# Patient Record
Sex: Male | Born: 1975 | Race: Black or African American | Hispanic: No | Marital: Married | Smoking: Never smoker
Health system: Southern US, Community
[De-identification: ages and names within clinical notes are randomized; demographics above are authoritative.]

## PROBLEM LIST (undated history)

## (undated) DIAGNOSIS — N289 Disorder of kidney and ureter, unspecified: Secondary | ICD-10-CM

## (undated) DIAGNOSIS — Z9119 Patient's noncompliance with other medical treatment and regimen: Secondary | ICD-10-CM

## (undated) DIAGNOSIS — R011 Cardiac murmur, unspecified: Secondary | ICD-10-CM

## (undated) DIAGNOSIS — B20 Human immunodeficiency virus [HIV] disease: Secondary | ICD-10-CM

## (undated) DIAGNOSIS — Z940 Kidney transplant status: Secondary | ICD-10-CM

## (undated) DIAGNOSIS — D649 Anemia, unspecified: Secondary | ICD-10-CM

## (undated) DIAGNOSIS — I10 Essential (primary) hypertension: Secondary | ICD-10-CM

## (undated) DIAGNOSIS — R569 Unspecified convulsions: Secondary | ICD-10-CM

## (undated) DIAGNOSIS — Z992 Dependence on renal dialysis: Secondary | ICD-10-CM

## (undated) DIAGNOSIS — B0229 Other postherpetic nervous system involvement: Secondary | ICD-10-CM

## (undated) DIAGNOSIS — N186 End stage renal disease: Secondary | ICD-10-CM

## (undated) DIAGNOSIS — D72820 Lymphocytosis (symptomatic): Secondary | ICD-10-CM

## (undated) HISTORY — DX: Other postherpetic nervous system involvement: B02.29

## (undated) HISTORY — DX: Unspecified convulsions: R56.9

## (undated) HISTORY — PX: KIDNEY TRANSPLANT: SHX239

## (undated) HISTORY — DX: Lymphocytosis (symptomatic): D72.820

## (undated) HISTORY — DX: Human immunodeficiency virus (HIV) disease: B20

## (undated) HISTORY — PX: INSERTION OF DIALYSIS CATHETER: SHX1324

## (undated) HISTORY — DX: Anemia, unspecified: D64.9

## (undated) HISTORY — DX: Patient's noncompliance with other medical treatment and regimen: Z91.19

---

## 2001-06-01 ENCOUNTER — Emergency Department (HOSPITAL_COMMUNITY): Admission: EM | Admit: 2001-06-01 | Discharge: 2001-06-01 | Payer: Self-pay | Admitting: Emergency Medicine

## 2004-11-15 ENCOUNTER — Emergency Department (HOSPITAL_COMMUNITY): Admission: EM | Admit: 2004-11-15 | Discharge: 2004-11-15 | Payer: Self-pay | Admitting: Emergency Medicine

## 2007-02-08 ENCOUNTER — Emergency Department (HOSPITAL_COMMUNITY): Admission: EM | Admit: 2007-02-08 | Discharge: 2007-02-09 | Payer: Self-pay | Admitting: *Deleted

## 2007-02-09 ENCOUNTER — Ambulatory Visit (HOSPITAL_COMMUNITY): Admission: RE | Admit: 2007-02-09 | Discharge: 2007-02-09 | Payer: Self-pay | Admitting: *Deleted

## 2013-05-03 ENCOUNTER — Encounter (HOSPITAL_COMMUNITY): Payer: Self-pay

## 2013-05-03 ENCOUNTER — Observation Stay (HOSPITAL_COMMUNITY)
Admission: EM | Admit: 2013-05-03 | Discharge: 2013-05-08 | Disposition: A | Discharge status: Home / Self Care | Payer: Managed Care, Other (non HMO) | Attending: Internal Medicine | Admitting: Internal Medicine

## 2013-05-03 DIAGNOSIS — D649 Anemia, unspecified: Secondary | ICD-10-CM

## 2013-05-03 DIAGNOSIS — M7989 Other specified soft tissue disorders: Secondary | ICD-10-CM | POA: Insufficient documentation

## 2013-05-03 DIAGNOSIS — Z992 Dependence on renal dialysis: Secondary | ICD-10-CM | POA: Insufficient documentation

## 2013-05-03 DIAGNOSIS — I10 Essential (primary) hypertension: Secondary | ICD-10-CM

## 2013-05-03 DIAGNOSIS — R7881 Bacteremia: Secondary | ICD-10-CM

## 2013-05-03 DIAGNOSIS — M899 Disorder of bone, unspecified: Secondary | ICD-10-CM | POA: Insufficient documentation

## 2013-05-03 DIAGNOSIS — I120 Hypertensive chronic kidney disease with stage 5 chronic kidney disease or end stage renal disease: Principal | ICD-10-CM | POA: Insufficient documentation

## 2013-05-03 DIAGNOSIS — N186 End stage renal disease: Secondary | ICD-10-CM | POA: Insufficient documentation

## 2013-05-03 DIAGNOSIS — R0602 Shortness of breath: Secondary | ICD-10-CM | POA: Insufficient documentation

## 2013-05-03 DIAGNOSIS — Z79899 Other long term (current) drug therapy: Secondary | ICD-10-CM | POA: Insufficient documentation

## 2013-05-03 DIAGNOSIS — B20 Human immunodeficiency virus [HIV] disease: Secondary | ICD-10-CM

## 2013-05-03 DIAGNOSIS — E875 Hyperkalemia: Secondary | ICD-10-CM | POA: Insufficient documentation

## 2013-05-03 DIAGNOSIS — E8779 Other fluid overload: Secondary | ICD-10-CM | POA: Insufficient documentation

## 2013-05-03 DIAGNOSIS — N19 Unspecified kidney failure: Secondary | ICD-10-CM

## 2013-05-03 HISTORY — DX: Essential (primary) hypertension: I10

## 2013-05-03 HISTORY — DX: Disorder of kidney and ureter, unspecified: N28.9

## 2013-05-03 HISTORY — DX: Dependence on renal dialysis: Z99.2

## 2013-05-03 LAB — CBC WITH DIFFERENTIAL/PLATELET
Lymphocytes Relative: 28 % (ref 12–46)
Lymphs Abs: 1.3 10*3/uL (ref 0.7–4.0)
Neutrophils Relative %: 53 % (ref 43–77)
Platelets: 246 10*3/uL (ref 150–400)
RBC: 2.98 MIL/uL — ABNORMAL LOW (ref 4.22–5.81)
WBC: 4.6 10*3/uL (ref 4.0–10.5)

## 2013-05-03 LAB — COMPREHENSIVE METABOLIC PANEL
ALT: 24 U/L (ref 0–53)
Alkaline Phosphatase: 87 U/L (ref 39–117)
CO2: 26 mEq/L (ref 19–32)
GFR calc Af Amer: 6 mL/min — ABNORMAL LOW (ref >90)
GFR calc non Af Amer: 5 mL/min — ABNORMAL LOW (ref >90)
Glucose, Bld: 74 mg/dL (ref 70–99)
Potassium: 5.3 mEq/L — ABNORMAL HIGH (ref 3.5–5.1)
Sodium: 141 mEq/L (ref 135–145)

## 2013-05-03 NOTE — ED Notes (Signed)
Pt new HD pt since Feb 2014 and moved here from [place].  NOrmal HD MWF was not HD today because he was told he needed a graft site.  He currently has a working HD cath in his left chest.  Presents for admission for placement of graft. Dr Detterding will be accepting pt.

## 2013-05-03 NOTE — ED Notes (Signed)
Pt reports he was recently dx w/kidney failure and has been receiving dialysis through a port in his Left chest. Pt is unable to complete dialysis d/t not having a fistula, he was instructed to come here to have a fistula placed and receive dialysis treatment until he could start care w/a local Nephrologist. Pt reports he just moved here from [place]. He receives dialysis M/W/F and his lat treatment was Friday

## 2013-05-04 ENCOUNTER — Emergency Department (HOSPITAL_COMMUNITY): Payer: Managed Care, Other (non HMO)

## 2013-05-04 ENCOUNTER — Encounter (HOSPITAL_COMMUNITY): Payer: Self-pay | Admitting: Internal Medicine

## 2013-05-04 ENCOUNTER — Other Ambulatory Visit: Payer: Self-pay | Admitting: *Deleted

## 2013-05-04 ENCOUNTER — Telehealth: Payer: Self-pay | Admitting: Vascular Surgery

## 2013-05-04 DIAGNOSIS — I10 Essential (primary) hypertension: Secondary | ICD-10-CM | POA: Diagnosis present

## 2013-05-04 DIAGNOSIS — B20 Human immunodeficiency virus [HIV] disease: Secondary | ICD-10-CM | POA: Diagnosis present

## 2013-05-04 DIAGNOSIS — D649 Anemia, unspecified: Secondary | ICD-10-CM | POA: Diagnosis present

## 2013-05-04 DIAGNOSIS — E875 Hyperkalemia: Secondary | ICD-10-CM | POA: Diagnosis present

## 2013-05-04 DIAGNOSIS — N186 End stage renal disease: Secondary | ICD-10-CM

## 2013-05-04 DIAGNOSIS — Z992 Dependence on renal dialysis: Secondary | ICD-10-CM | POA: Diagnosis present

## 2013-05-04 LAB — BASIC METABOLIC PANEL
Chloride: 105 mEq/L (ref 96–112)
GFR calc Af Amer: 5 mL/min — ABNORMAL LOW (ref >90)
GFR calc Af Amer: 5 mL/min — ABNORMAL LOW (ref >90)
GFR calc non Af Amer: 4 mL/min — ABNORMAL LOW (ref >90)
Glucose, Bld: 69 mg/dL — ABNORMAL LOW (ref 70–99)
Potassium: 6 mEq/L — ABNORMAL HIGH (ref 3.5–5.1)
Potassium: 5.1 mEq/L (ref 3.5–5.1)
Sodium: 139 mEq/L (ref 135–145)
Sodium: 138 mEq/L (ref 135–145)

## 2013-05-04 LAB — FOLATE: Folate: 16.9 ng/mL

## 2013-05-04 LAB — VITAMIN B12: Vitamin B-12: 579 pg/mL (ref 211–911)

## 2013-05-04 LAB — RETICULOCYTES
RBC.: 3.05 MIL/uL — ABNORMAL LOW (ref 4.22–5.81)
Retic Count, Absolute: 61 10*3/uL (ref 19.0–186.0)

## 2013-05-04 LAB — MRSA PCR SCREENING: MRSA by PCR: NEGATIVE

## 2013-05-04 MED ORDER — CARVEDILOL 6.25 MG PO TABS
6.2500 mg | ORAL_TABLET | Freq: Two times a day (BID) | ORAL | Status: DC
  Administered 2013-05-04 – 2013-05-08 (×8): 6.25 mg via ORAL
  Filled 2013-05-04 (×10): qty 1

## 2013-05-04 MED ORDER — ATOVAQUONE 750 MG/5ML PO SUSP
1500.0000 mg | Freq: Every day | ORAL | Status: DC
  Administered 2013-05-04 – 2013-05-06 (×3): 1500 mg via ORAL
  Filled 2013-05-04 (×3): qty 10

## 2013-05-04 MED ORDER — ACETAMINOPHEN 325 MG PO TABS
650.0000 mg | ORAL_TABLET | Freq: Four times a day (QID) | ORAL | Status: DC | PRN
  Administered 2013-05-07 – 2013-05-08 (×2): 650 mg via ORAL
  Filled 2013-05-04: qty 2

## 2013-05-04 MED ORDER — ASPIRIN EC 81 MG PO TBEC
81.0000 mg | DELAYED_RELEASE_TABLET | Freq: Every day | ORAL | Status: DC
  Administered 2013-05-04 – 2013-05-08 (×5): 81 mg via ORAL
  Filled 2013-05-04 (×5): qty 1

## 2013-05-04 MED ORDER — ONDANSETRON HCL 4 MG PO TABS
4.0000 mg | ORAL_TABLET | Freq: Four times a day (QID) | ORAL | Status: DC | PRN
  Administered 2013-05-07: 4 mg via ORAL
  Filled 2013-05-04: qty 1

## 2013-05-04 MED ORDER — ACETAMINOPHEN 650 MG RE SUPP: 650.0000 mg | Freq: Four times a day (QID) | RECTAL | Status: DC | PRN

## 2013-05-04 MED ORDER — DEXTROSE 5 % IV SOLN
160.0000 mg | Freq: Once | INTRAVENOUS | Status: AC
  Administered 2013-05-04: 160 mg via INTRAVENOUS
  Filled 2013-05-04: qty 16

## 2013-05-04 MED ORDER — LOSARTAN POTASSIUM 50 MG PO TABS
50.0000 mg | ORAL_TABLET | Freq: Every day | ORAL | Status: DC
  Administered 2013-05-04 – 2013-05-08 (×5): 50 mg via ORAL
  Filled 2013-05-04 (×5): qty 1

## 2013-05-04 MED ORDER — HEPARIN SODIUM (PORCINE) 5000 UNIT/ML IJ SOLN
5000.0000 [IU] | Freq: Three times a day (TID) | INTRAMUSCULAR | Status: DC
  Administered 2013-05-04 – 2013-05-08 (×8): 5000 [IU] via SUBCUTANEOUS
  Filled 2013-05-04 (×14): qty 1

## 2013-05-04 MED ORDER — ONDANSETRON HCL 4 MG/2ML IJ SOLN: 4.0000 mg | Freq: Four times a day (QID) | INTRAMUSCULAR | Status: DC | PRN

## 2013-05-04 MED ORDER — SODIUM CHLORIDE 0.9 % IJ SOLN
3.0000 mL | Freq: Two times a day (BID) | INTRAMUSCULAR | Status: DC
  Administered 2013-05-04 – 2013-05-08 (×6): 3 mL via INTRAVENOUS

## 2013-05-04 MED ORDER — SODIUM POLYSTYRENE SULFONATE 15 GM/60ML PO SUSP
30.0000 g | Freq: Once | ORAL | Status: AC
  Administered 2013-05-04: 30 g via ORAL
  Filled 2013-05-04 (×2): qty 60

## 2013-05-04 MED ORDER — SULFAMETHOXAZOLE-TMP DS 800-160 MG PO TABS
1.0000 | ORAL_TABLET | Freq: Two times a day (BID) | ORAL | Status: DC
  Administered 2013-05-04 – 2013-05-08 (×8): 1 via ORAL
  Filled 2013-05-04 (×9): qty 1

## 2013-05-04 MED ORDER — AZITHROMYCIN 600 MG PO TABS
1200.0000 mg | ORAL_TABLET | ORAL | Status: DC
  Administered 2013-05-07: 1200 mg via ORAL
  Filled 2013-05-04: qty 2

## 2013-05-04 NOTE — ED Notes (Signed)
Admit Doctor stated will admit patient to hospital patient verbalized understanding.

## 2013-05-04 NOTE — Progress Notes (Addendum)
Pt arrived to unit via ED stretcher a&ox4 accompanied by NT. Pt ambulatory from stretcher to bed, steady gait. Pt states had episode of falls in March after initially beginning HD, denies dizziness. Fall Plan signed, pt agrees to use call bell. Pt oriented to unit. Pt denies pain, endorses SOB, lungs CTA, SpO2 98% RA, pt speaking in full sentences. Placed on telemetry and CMT notified of arrival. No skin issues, pt has tunneled HD cath to left chest and tattoos, no other skin issues. Will continue to monitor.

## 2013-05-04 NOTE — ED Provider Notes (Signed)
History    CSN: [id number] Arrival date & time 05/03/13  2209  First MD Initiated Contact with Patient 05/03/13 2359     Chief Complaint  Patient presents with  . Vascular Access Problem   (Consider location/radiation/quality/duration/timing/severity/associated sxs/prior Treatment) HPI Pt is a 37yo male who is newly dx with kidney failure and started on dialysis in Jan 2014. Pt states he was found to have acutely elevated BP which caused kidney failure, reports strong family hx of kidney failure.  Pt recently moved from [place] to [place].  He does have a catheter placed in his left upper chest but was told that he needs a fistula placed in order for dialysis clinic to see the pt.  He was told by his social worker that Dr. Jimmy Footman would be his new nephrologist but he does not currently have a PCP since he just moved here on Friday.  Pt states he is suppose to go to dialysis M/W/F so he missed today's tx due to not having a fistula. Reports mild SOB and lower leg swelling associated with mild nausea and diarrhea (no blood or mucus). Denies any chest, abdominal or flank pain. Denies any urinary symptoms.   Past Medical History  Diagnosis Date  . Hypertension   . Dialysis patient   . Renal disorder    History reviewed. No pertinent past surgical history. Family History  Problem Relation Age of Onset  . Kidney failure Mother   . Kidney failure Maternal Uncle    History  Substance Use Topics  . Smoking status: Never Smoker   . Smokeless tobacco: Not on file  . Alcohol Use: 0.6 oz/week    1 Glasses of wine per week     Comment: 1 glass wine per week    Review of Systems  Constitutional: Negative for fever, chills, diaphoresis, appetite change and fatigue.  Respiratory: Positive for shortness of breath. Negative for cough, choking, chest tightness, wheezing and stridor.   Cardiovascular: Positive for leg swelling. Negative for chest pain and palpitations.  Gastrointestinal: Positive for  nausea and diarrhea. Negative for vomiting, abdominal pain and constipation.  Genitourinary: Negative for dysuria, hematuria and flank pain.  Skin: Negative for color change.  All other systems reviewed and are negative.    Allergies  Codeine; Eggs or egg-derived products; Mercury; and Shellfish allergy  Home Medications   No current outpatient prescriptions on file. BP 140/90  Pulse 108  Temp(Src) 97.9 F (36.6 C) (Oral)  Resp 18  Ht 5' 7.5" (1.715 m)  Wt 162 lb 6.4 oz (73.664 kg)  BMI 25.05 kg/m2  SpO2 100% Physical Exam  Nursing note and vitals reviewed. Constitutional: He appears well-developed and well-nourished. No distress.  Pt lying comfortably on exam bed. NAD.  HENT:  Head: Normocephalic and atraumatic.  Eyes: Conjunctivae are normal. Right eye exhibits no discharge. Left eye exhibits no discharge. No scleral icterus.  Neck: Normal range of motion. Neck supple.  Cardiovascular: Normal rate, regular rhythm and normal heart sounds.   Pulmonary/Chest: Effort normal and breath sounds normal. No respiratory distress. He has no wheezes. He has no rales. He exhibits tenderness ( mild around newly placed catheter).  Newly placed, well healing venous access in left upper chest.   Abdominal: Soft. Bowel sounds are normal. He exhibits no distension and no mass. There is no tenderness. There is no rebound and no guarding.  Musculoskeletal: Normal range of motion.  Neurological: He is alert.  Skin: Skin is warm and dry. He is  not diaphoretic.    ED Course  Procedures (including critical care time) Labs Reviewed  CBC WITH DIFFERENTIAL - Abnormal; Notable for the following:    RBC 2.98 (*)    Hemoglobin 9.4 (*)    HCT 28.6 (*)    RDW 16.0 (*)    Eosinophils Relative 10 (*)    All other components within normal limits  COMPREHENSIVE METABOLIC PANEL - Abnormal; Notable for the following:    Potassium 5.3 (*)    BUN 35 (*)    Creatinine, Ser 10.88 (*)    Calcium 7.7 (*)     Albumin 1.8 (*)    Total Bilirubin 0.1 (*)    GFR calc non Af Amer 5 (*)    GFR calc Af Amer 6 (*)    All other components within normal limits  PHOSPHORUS - Abnormal; Notable for the following:    Phosphorus 4.9 (*)    All other components within normal limits  BASIC METABOLIC PANEL - Abnormal; Notable for the following:    Potassium 6.0 (*)    Glucose, Bld 69 (*)    BUN 40 (*)    Creatinine, Ser 12.42 (*)    Calcium 7.7 (*)    GFR calc non Af Amer 4 (*)    GFR calc Af Amer 5 (*)    All other components within normal limits  IRON AND TIBC - Abnormal; Notable for the following:    TIBC 175 (*)    All other components within normal limits  FERRITIN - Abnormal; Notable for the following:    Ferritin 485 (*)    All other components within normal limits  RETICULOCYTES - Abnormal; Notable for the following:    RBC. 3.05 (*)    All other components within normal limits  BASIC METABOLIC PANEL - Abnormal; Notable for the following:    Glucose, Bld 164 (*)    BUN 40 (*)    Creatinine, Ser 12.50 (*)    Calcium 7.5 (*)    GFR calc non Af Amer 4 (*)    GFR calc Af Amer 5 (*)    All other components within normal limits  MRSA PCR SCREENING  HEPATITIS B SURFACE ANTIGEN  MAGNESIUM  VITAMIN B12  FOLATE  HIV-1 RNA ULTRAQUANT REFLEX TO GENTYP+  T-HELPER CELLS (CD4) COUNT  PARATHYROID HORMONE, INTACT (NO CA)  CBC  RENAL FUNCTION PANEL   Dg Chest 2 View  05/04/2013   *RADIOLOGY REPORT*  Clinical Data: Shortness of breath, dialysis patient.  CHEST - 2 VIEW  Comparison: None.  Findings: Left dialysis catheter is in place with the tip at the cavoatrial junction.  Mild cardiomegaly.  Mild peribronchial thickening.  No confluent airspace opacities, effusions or edema. No acute bony abnormality.  IMPRESSION: Cardiomegaly.  Mild peribronchial thickening.   Original Report Authenticated By: Rolm Baptise, M.D.   1. Hyperkalemia   2. Kidney failure   3. AIDS     MDM  Pt states he needs  fistula placed and dialysis done in ER because he is new to the area and was told the catheter he has in left upper chest is not accepted at dialysis centers in [place].  Dr. Jimmy Footman is suppose to be pt's new nephrologist (according to pt).  Labs ordered: CBC and CMP. CXR ordered too.   Discussed pt with Dr. Cheri Guppy who consulted nephrology.  Pt was advised by nephrology prior to moving to [place] that he did need a permanent catheter placed for dialysis. Dr. Jimmy Footman  mentioned pt is HIV positive, pt did not mention during initial H&P.  When questioned further, pt states he was newly diagnosed, CD4 count was "low" pt believes 60, and does not know viral load.    Dr. Cheri Guppy is going to consult vascular, pt will likely be observed in ER until he can go to dialysis in the morning.    Singed out to Dr. Cheri Guppy at shift change.   Noland Fordyce, PA-C 05/05/13 0102

## 2013-05-04 NOTE — ED Notes (Signed)
Dr in to see pt 

## 2013-05-04 NOTE — ED Notes (Signed)
Attempted to call report x 1  

## 2013-05-04 NOTE — Telephone Encounter (Signed)
lvm re appt info (this Friday) and asked pt to cb to confirm appt - kf

## 2013-05-04 NOTE — Telephone Encounter (Signed)
Message copied by Berniece Salines on Tue May 04, 2013 10:46 AM ------      Message from: Alfonso Patten      Created: Tue May 04, 2013  9:32 AM       Dr Otilio Miu has left a phone msg which I forwarded also on this pt. He would like this pt to be seen in the next 1-2 weeks with whoever has an opening for access evaluation. The pt has a catheter. He also wants bilateral vein mapping. I will order that.      Thanks      JJK ------

## 2013-05-04 NOTE — ED Notes (Signed)
Contacted [facility] about wait time for bed. Apologized to pt for delay.

## 2013-05-04 NOTE — ED Notes (Signed)
Apple sauce given to pt.  Offers no compliants

## 2013-05-04 NOTE — H&P (Signed)
Date: 05/04/2013               Patient Name:  Dylan Barber MRN: [id number]  DOB: 1976-08-23 Age / Sex: 37 y.o., male   PCP: Placido Sou, MD         Medical Service: Internal Medicine Teaching Service         Attending Physician: Dr. Elyn Peers, MD    First Contact: Dr. Randell Loop Pager: [phone]  Second Contact: Dr. Jannette Fogo Pager: [phone]       After Hours (After 5p/  First Contact Pager: [phone]  weekends / holidays): Second Contact Pager: [phone]   Chief Complaint: ESRD on dialysis  History of Present Illness: [name] is a 37 year old man with a PMH of end stage renal disease initially diagnosed in 11/2012 at [facility], dialysis started in February at the same, and newly discovered HIV infection that was initially diagnosed in April of 2014 who presents to the [facility] ED for dialysis.  He states that he was last dialyzed in [place], [place]. On Friday and was told by his social worker there to present to the ED at [facility] to get placed in a dialysis center here in [place].  He states that he moved here Saturday to be closer to his father who lives in [place].  He has a strong family history of ESRD with kidney failure in his mother and uncle.  He has a tunneled catheter placed in his left upper chest that was placed 2 weeks ago per his report.  He states that he is trying to get set up with Dr. Jimmy Footman and was told this was what he had to do to get this set up.  Currently he states that he is concerned about his lower extremity swelling and shortness of breath.  He states that he gets fatigued walking from the ED room to the bathroom less then 100 ft away.  He also has a hard time laying down to sleep.  He denies cough, or palpitations.    He also notes that he was diagnosed with HIV in April of 2014.  He does not remember his CD 4 count or his viral load but states that "I know it was low."  He has not started ART therapy but states that he has been taking his  Atovaquone, Azithromycin, and Bactrim as prescribed by his doctor in [place], [place]. He currently denies any cough, fevers, chills, nausea, vomiting, chest pain, or abdominal pain.    Meds: Current Outpatient Prescriptions  Medication Sig Dispense Refill  . atovaquone (MEPRON) 750 MG/5ML suspension Take 1,500 mg by mouth daily.      Marland Kitchen azithromycin (ZITHROMAX) 600 MG tablet Take 1,200 mg by mouth every 7 (seven) days. Fridays      . carvedilol (COREG) 6.25 MG tablet Take 6.25 mg by mouth 2 (two) times daily with a meal.      . losartan (COZAAR) 50 MG tablet Take 50 mg by mouth daily.      Marland Kitchen sulfamethoxazole-trimethoprim (BACTRIM DS) 800-160 MG per tablet Take 1 tablet by mouth 2 (two) times daily. For 14 days; Start date 04/29/13       Allergies: Allergies as of 05/03/2013 - Review Complete 05/03/2013  Allergen Reaction Noted  . Codeine  05/03/2013  . Eggs or egg-derived products  05/03/2013  . Mercury  05/03/2013  . Shellfish allergy  05/03/2013   Past Medical History  Diagnosis Date  . Hypertension   .  Dialysis patient   . Renal disorder    History reviewed. No pertinent past surgical history. Family History  Problem Relation Age of Onset  . Kidney failure Mother   . Kidney failure Maternal Uncle    History   Social History  . Marital Status: Single    Spouse Name: N/A    Number of Children: N/A  . Years of Education: N/A   Occupational History  . Not on file.   Social History Main Topics  . Smoking status: Never Smoker   . Smokeless tobacco: Not on file  . Alcohol Use: 0.6 oz/week    1 Glasses of wine per week     Comment: 1 glass wine per week  . Drug Use: No  . Sexually Active: Not on file   Other Topics Concern  . Not on file   Social History Narrative   Originally from [place],  States father worked at [employer].  Moved from [place], [place]. On 6/21 to live with father.    Review of Systems: Constitutional: Positive for fatigue.  Denies fever, chills,  diaphoresis, appetite change.  HEENT: Denies photophobia, eye pain, redness, hearing loss, ear pain, congestion, sore throat, rhinorrhea, sneezing, mouth sores, trouble swallowing, neck pain, neck stiffness and tinnitus.   Respiratory: Positive for SOB and orthopnea.  Denies DOE, cough, chest tightness, and wheezing.   Cardiovascular: Denies chest pain, palpitations and leg swelling.  Gastrointestinal: Denies nausea, vomiting, abdominal pain, diarrhea, constipation, blood in stool and abdominal distention.  Genitourinary: Denies dysuria, urgency, frequency, hematuria, flank pain and difficulty urinating.  Endocrine: Denies: hot or cold intolerance, sweats, changes in hair or nails, polyuria, polydipsia. Musculoskeletal: Denies myalgias, back pain, joint swelling, arthralgias and gait problem.  Skin: Denies pallor, rash and wound.  Neurological: Denies dizziness, seizures, syncope, weakness, light-headedness, numbness and headaches.  Hematological: Denies adenopathy. Easy bruising, personal or family bleeding history  Psychiatric/Behavioral: Denies suicidal ideation, mood changes, confusion, nervousness, sleep disturbance and agitation  Physical Exam: Blood pressure 144/110, pulse 84, temperature 98.6 F (37 C), temperature source Oral, resp. rate 14, height 5\' 7"  (1.702 m), weight 155 lb (70.308 kg), SpO2 100.00%. Constitutional: Vital signs reviewed.  Patient is a well-developed and well-nourished man in no acute distress and cooperative with exam. Alert and oriented x3.  Head: Normocephalic and atraumatic Ear: TM normal bilaterally Nose: No erythema or drainage noted.  Turbinates normal Mouth: no erythema or exudates, MMM Eyes: PERRL, EOMI, conjunctivae normal, No scleral icterus.  Neck: Supple, Trachea midline normal ROM, No JVD, mass, thyromegaly, or carotid bruit present.  Cardiovascular: RRR, S1 normal, S2 normal, no MRG, pulses symmetric and intact bilaterally Pulmonary/Chest: normal  respiratory effort, mild bibasilar crackles noted on full inspiration.  no wheezes, or rhonchi Abdominal: Soft. Non-tender, non-distended, bowel sounds are normal, no masses, organomegaly, or guarding present.  GU: no CVA tenderness Musculoskeletal: No joint deformities, erythema, or stiffness, ROM full and no nontender Hematology: no cervical, inginal, or axillary adenopathy.  Neurological: A&O x3, Strength is normal and symmetric bilaterally, cranial nerve II-XII are grossly intact, no focal motor deficit, sensory intact to light touch bilaterally.  Skin: 1+ pitting edema to the knees bilaterally.  Warm, dry and intact. No rash, cyanosis, or clubbing.  Psychiatric: Normal mood and affect. speech and behavior is normal. Judgment and thought content normal. Cognition and memory are normal.   Lab results: Basic Metabolic Panel:  Recent Labs  05/03/13 2230  NA 141  K 5.3*  CL 107  CO2 26  GLUCOSE 74  BUN 35*  CREATININE 10.88*  CALCIUM 7.7*   Liver Function Tests:  Recent Labs  05/03/13 2230  AST 33  ALT 24  ALKPHOS 87  BILITOT 0.1*  PROT 6.2  ALBUMIN 1.8*   CBC:  Recent Labs  05/03/13 2230  WBC 4.6  NEUTROABS 2.4  HGB 9.4*  HCT 28.6*  MCV 96.0  PLT 246   Imaging results:  Dg Chest 2 View  05/04/2013   *RADIOLOGY REPORT*  Clinical Data: Shortness of breath, dialysis patient.  CHEST - 2 VIEW  Comparison: None.  Findings: Left dialysis catheter is in place with the tip at the cavoatrial junction.  Mild cardiomegaly.  Mild peribronchial thickening.  No confluent airspace opacities, effusions or edema. No acute bony abnormality.  IMPRESSION: Cardiomegaly.  Mild peribronchial thickening.   Original Report Authenticated By: Rolm Baptise, M.D.   Other results: EKG: pending  Assessment & Plan by Problem: Mr. Stapleford is a 37 year old man who presents with ESRD on dialysis and HIV.  1.  ESRD on dialysis:  Mr. Mccullen was last dialyzed on Friday 6/20.  I spoke with both Dr.  Florene Glen from [facility] as well as Dr. Jimmy Footman who the patient states he is trying to be his primary nephrologist and they stated that the patient was told that he can not be set up with outpatient dialysis in [place] until he has a permanent access in place, aka. AV fistula.  He states that he underwent vein mapping in . Last week but they would not place the fistula because he was moving.  He has mild hyperkalemia and mild SOB with a normal chest x-ray and normal oxygen saturations.  He does not have acidosis, marked fluid overload, or uremia so he has no urgent indications for dialysis today but likely will in the next day or so.  Dr. Oneida Alar from Vascular surgery saw him today and will work on getting him set up to have his fistula placed.  The patient has no general PCP in the area and will likely need dialysis in the next 1-2 days.    - Admit for observation  - Discuss with Dr. Oneida Alar if his fistula can be done as an inpatient or if he can discuss the patients dialysis access with [facility].    - Repeat chest x-ray and renal panel in the AM to assess need for dialysis tomorrow  -  Continue to discuss his outpatient dialysis placement with [facility].  - intact PTH to assess for secondary hyperparathyroidism.   2. Hyperkalemia:  Mild at 5.3.  EKG pending.  - Kayexalate 30 g once  - Repeat Bmet at 1500 to assess need for further kayexalate.    - Renal panel in the AM.  3.  HIV disease:  He has newly diagnosed HIV and states that he knows his CD4 count is "low."  He states that he has been taking his OIs including atovaquone, Bactrim, and Azithromycin.    - Get records from [facility]  - Repeat CD4 and HIV viral load with reflex to genotype  - likely need [facility] follow up as outpatient.    4.  Normocytic anemia: HgB on admission today was 9.4.  We have no recent records of a hgb level for this patient. MCV is 96.0.  Most likely secondary to his ESRD.  Will  check iron stores and consider EPO therapy per renals recommendations  5.  HTN: Continue home medications.  6. VTE: Heparin  Dispo: Disposition is deferred at this time, awaiting improvement of current medical problems. Anticipated discharge in approximately 1-2 day(s).   The patient does not have a current PCP  and does need an [facility] hospital follow-up appointment after discharge.  The patient does not have transportation limitations that hinder transportation to clinic appointments.  Signed: Trish Fountain, MD 05/04/2013, 10:23 AM

## 2013-05-04 NOTE — ED Notes (Signed)
IV team at bedside to get IV established.

## 2013-05-04 NOTE — ED Notes (Signed)
IV start unsuccessful x 2.  Called IV team to try.

## 2013-05-04 NOTE — ED Provider Notes (Signed)
Personally evaluated this patient. Had discussion with Dr. Wende Crease and then Dr. Florene Glen. Page to vascular surgery has not been returned. Plan is to admit the patient to the medicine service so that he may be dialyzed and consult vascular surgery for graft placement.   Elyn Peers, MD 05/04/13 0730

## 2013-05-04 NOTE — ED Notes (Signed)
Patient received meal tray.   Renal diet.

## 2013-05-04 NOTE — ED Notes (Signed)
Trying to locate cables to hook patient up for EKG.   Jeneen Rinks, EMT, going to [place] to get additional cables.

## 2013-05-05 ENCOUNTER — Observation Stay (HOSPITAL_COMMUNITY): Payer: Managed Care, Other (non HMO)

## 2013-05-05 DIAGNOSIS — N186 End stage renal disease: Secondary | ICD-10-CM

## 2013-05-05 LAB — HIV-1 RNA ULTRAQUANT REFLEX TO GENTYP+: HIV-1 RNA Quant, Log: 5.13 {Log} — ABNORMAL HIGH (ref <1.30)

## 2013-05-05 LAB — RENAL FUNCTION PANEL
Albumin: 1.7 g/dL — ABNORMAL LOW (ref 3.5–5.2)
BUN: 41 mg/dL — ABNORMAL HIGH (ref 6–23)
Chloride: 104 mEq/L (ref 96–112)
GFR calc Af Amer: 5 mL/min — ABNORMAL LOW (ref >90)
Glucose, Bld: 64 mg/dL — ABNORMAL LOW (ref 70–99)
Potassium: 5.4 mEq/L — ABNORMAL HIGH (ref 3.5–5.1)
Sodium: 138 mEq/L (ref 135–145)

## 2013-05-05 LAB — PARATHYROID HORMONE, INTACT (NO CA): PTH: 241.8 pg/mL — ABNORMAL HIGH (ref 14.0–72.0)

## 2013-05-05 LAB — CBC
HCT: 28.4 % — ABNORMAL LOW (ref 39.0–52.0)
Hemoglobin: 9.3 g/dL — ABNORMAL LOW (ref 13.0–17.0)
WBC: 4 10*3/uL (ref 4.0–10.5)

## 2013-05-05 LAB — T-HELPER CELLS (CD4) COUNT (NOT AT ARMC): CD4 T Cell Abs: 120 uL — ABNORMAL LOW (ref 400–2700)

## 2013-05-05 MED ORDER — SODIUM CHLORIDE 0.9 % IV SOLN: 100.0000 mL | INTRAVENOUS | Status: DC | PRN

## 2013-05-05 MED ORDER — PENTAFLUOROPROP-TETRAFLUOROETH EX AERO: 1.0000 "application " | INHALATION_SPRAY | CUTANEOUS | Status: DC | PRN

## 2013-05-05 MED ORDER — LIDOCAINE-PRILOCAINE 2.5-2.5 % EX CREA: 1.0000 "application " | TOPICAL_CREAM | CUTANEOUS | Status: DC | PRN

## 2013-05-05 MED ORDER — HEPARIN SODIUM (PORCINE) 1000 UNIT/ML DIALYSIS: 100.0000 [IU]/kg | INTRAMUSCULAR | Status: DC | PRN

## 2013-05-05 MED ORDER — SODIUM CHLORIDE 0.9 % IV SOLN
125.0000 mg | INTRAVENOUS | Status: DC
  Administered 2013-05-07: 125 mg via INTRAVENOUS
  Filled 2013-05-05 (×2): qty 10

## 2013-05-05 MED ORDER — DARBEPOETIN ALFA-POLYSORBATE 150 MCG/0.3ML IJ SOLN: 150.0000 ug | Freq: Once | INTRAMUSCULAR | Status: AC

## 2013-05-05 MED ORDER — DARBEPOETIN ALFA-POLYSORBATE 150 MCG/0.3ML IJ SOLN
INTRAMUSCULAR | Status: AC
  Administered 2013-05-05: 150 ug via INTRAVENOUS
  Filled 2013-05-05: qty 0.3

## 2013-05-05 MED ORDER — LIDOCAINE HCL (PF) 1 % IJ SOLN: 5.0000 mL | INTRAMUSCULAR | Status: DC | PRN

## 2013-05-05 MED ORDER — HEPARIN SODIUM (PORCINE) 1000 UNIT/ML DIALYSIS
1000.0000 [IU] | INTRAMUSCULAR | Status: DC | PRN
  Administered 2013-05-05: 3100 [IU] via INTRAVENOUS_CENTRAL

## 2013-05-05 MED ORDER — ALTEPLASE 2 MG IJ SOLR
2.0000 mg | Freq: Once | INTRAMUSCULAR | Status: DC | PRN
  Filled 2013-05-05: qty 2

## 2013-05-05 MED ORDER — DEXTROSE 5 % IV SOLN: 1.5000 g | INTRAVENOUS | Status: DC

## 2013-05-05 MED ORDER — DEXTROSE 5 % IV SOLN
1.5000 g | INTRAVENOUS | Status: AC
  Administered 2013-05-07: 1.5 g via INTRAVENOUS
  Filled 2013-05-05: qty 1.5

## 2013-05-05 MED ORDER — NEPRO/CARBSTEADY PO LIQD: 237.0000 mL | ORAL | Status: DC | PRN

## 2013-05-05 MED ORDER — SODIUM CHLORIDE 0.9 % IV SOLN
25.0000 mg | Freq: Once | INTRAVENOUS | Status: AC
  Administered 2013-05-05: 25 mg via INTRAVENOUS
  Filled 2013-05-05: qty 2

## 2013-05-05 NOTE — Progress Notes (Signed)
Nutrition Brief Note  Patient identified on the Malnutrition Screening Tool (MST) Report. Pt reports that his weight has been stable; reports usual body weight of 155 lb, current weight is 162 lb. He states that his nephrologist in [place] almost put him on megace, but his intake improved and he states that he is actually gaining weight now. Consuming zone bars for snacks that he has been consuming since start of HD. Follows a renal diet well - was able to answer my questions correctly about specific foods when quizzing him.  Body mass index is 25.05 kg/(m^2). Patient meets criteria for Normal Weight based on current BMI.   Current diet order is Renal, patient is consuming approximately 50-100% of meals at this time. Labs and medications reviewed.   No nutrition interventions warranted at this time. If nutrition issues arise, please consult RD.   Inda Coke MS, RD, LDN Pager: [phone] After-hours pager: [phone]

## 2013-05-05 NOTE — H&P (Signed)
Date: 05/05/2013 Patient name: Dylan Barber  Medical record number: [id number]  Date of birth: [birth year]  The patient, Dylan Barber, is a 37 y.o. 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 In the process of moving, the patient missed his Friday session of HD in [place], and was apparently wrongly informed by his Education officer, museum that he has been set up for outpatient dialysis in [place]. The reality being centers in [place] refused him because the patient does not possess and AV fistula at present. His HDs have been done through catheter on his left side chest.   Having missed his Friday session, the patient started feeling congested a little bit, and was short of breath. Understanding his symptoms, he came to ER at [facility] to get dialysed.   His other problem is that he is not on HAART for AIDS and his last CD4 cell count was so low that he has been put on Azithromycin and Bactrim per his [place] provider. He wants to establish care in an ID clinic.  Filed Vitals:   05/05/13 1600  BP: 185/117  Pulse: 107  Temp:   Resp:    I met with him while he was getting his dialysis. He is a very pleasant and cheerful african [place] man with a calm disposition. He displays no acute distress. He feel better after dialysis. He said he had orthopnea before.   HEENT: PERRL, EOMI, no scleral icterus. Heart: RRR, no rubs, murmurs or gallops. Lungs: Clear to auscultation bilaterally, no wheezes, rales, or rhonchi. Abdomen: Soft, nontender, nondistended, BS present. Extremities: Warm, mild pitting pedal edema around ankles. Neuro: Alert and oriented X3, cranial nerves II-XII grossly intact,  strength and sensation to light touch equal in bilateral upper and lower extremities  Lab trends  Recent Labs Lab 05/03/13 2230 05/05/13 0545  HGB 9.4* 9.3*  HCT 28.6* 28.4*  WBC 4.6 4.0  PLT 246 239    Recent Labs Lab  05/03/13 2230 05/04/13 1716 05/04/13 2158 05/05/13 0545  NA 141 139 138 138  K 5.3* 6.0* 5.1 5.4*  CL 107 107 105 104  CO2 26 20 24 20   GLUCOSE 74 69* 164* 64*  BUN 35* 40* 40* 41*  CREATININE 10.88* 12.42* 12.50* 13.27*  CALCIUM 7.7* 7.7* 7.5* 7.5*  MG  --  2.5  --   --   PHOS  --  4.9*  --  5.0*    Recent Labs Lab 05/03/13 2230 05/05/13 0545  AST 33  --   ALT 24  --   ALKPHOS 87  --   BILITOT 0.1*  --   PROT 6.2  --   ALBUMIN 1.8* 1.7*   EKGs reviewed. Imaging reviewed - CXR -  bronchitic changes.  Hospital Medications . aspirin EC  81 mg Oral Daily  . atovaquone  1,500 mg Oral Daily  . [START ON 05/07/2013] azithromycin  1,200 mg Oral Q Fri  . carvedilol  6.25 mg Oral BID WC  . [START ON 05/07/2013] cefUROXime (ZINACEF)  IV  1.5 g Intravenous On Call to OR  . darbepoetin      . darbepoetin (ARANESP) injection - DIALYSIS  150 mcg Intravenous Once  . [START ON 05/07/2013] ferric gluconate (FERRLECIT/NULECIT) IV  125 mg Intravenous Q M,W,F-HD  . ferric gluconate (FERRLECIT/NULECIT) Test Dose  25 mg Intravenous Once  . heparin  5,000 Units Subcutaneous Q8H  . losartan  50 mg Oral Daily  . sodium chloride  3 mL Intravenous Q12H  . sulfamethoxazole-trimethoprim  1 tablet Oral BID     Assessment and Plan   I have read the note by Dr. Obie Dredge. I agree with the plan of care, with the following additions:  The patient has been seen by nephrology and vascular surgery. He is getting dialyses now, and the will also set up outpatient care for him. He will get his Av fistula done in this hospital stay. We will talk to ID and see if we can set up HIV care for the patient at the [facility] ID clinic.    Rest of the chronic issues per resident note.   [place], [place] 05/05/2013, 4:05 PM.

## 2013-05-05 NOTE — Consult Note (Signed)
[facility] Consult  Reason for Consult:  ESRD Referring Physician:  Florene Glen  [id number]  History of Present Illness: This is a 37 y.o. male with Hx of ESRD started on HD in February and newly diagnosed HIV in April.  He is from [place] and told to present to the ED to get placed in a HD center here in [place] as he has just moved here.  He does have a diatek catheter in place.  He does have family hx of ESRD with mother and uncle.  He does state he has SOB with minimal exertion and holding on to fluid since he has not dialyzed since last Friday.    Pt states that after getting his graduate degree, he went to work in [employer] and was later transferred to [place].  He states that during that time, he was hypertensive and has had progressive decline of his renal function.  He states that he does make urine, but this is gradually decreasing.  He states that his nephrologist in [place] told him that his best place for access is at his left wrist.  He has not let anyone perform needle sticks or BP's on the left arm.   Past Medical History  Diagnosis Date  . Hypertension   . Dialysis patient   . Renal disorder    History reviewed. No pertinent past surgical history.  Allergies  Allergen Reactions  . Codeine   . Eggs Or Egg-Derived Products   . Mercury   . Shellfish Allergy     Prior to Admission medications   Medication Sig Start Date End Date Taking? Authorizing Provider  atovaquone (MEPRON) 750 MG/5ML suspension Take 1,500 mg by mouth daily.   Yes Historical Provider, MD  azithromycin (ZITHROMAX) 600 MG tablet Take 1,200 mg by mouth every 7 (seven) days. Fridays   Yes Historical Provider, MD  carvedilol (COREG) 6.25 MG tablet Take 6.25 mg by mouth 2 (two) times daily with a meal.   Yes Historical Provider, MD  losartan (COZAAR) 50 MG tablet Take 50 mg by mouth daily.   Yes Historical Provider, MD  sulfamethoxazole-trimethoprim (BACTRIM DS) 800-160 MG per tablet Take 1  tablet by mouth 2 (two) times daily. For 14 days; Start date 04/29/13   Yes Historical Provider, MD    History   Social History  . Marital Status: Single    Spouse Name: N/A    Number of Children: N/A  . Years of Education: N/A   Occupational History  . Not on file.   Social History Main Topics  . Smoking status: Never Smoker   . Smokeless tobacco: Not on file  . Alcohol Use: 0.6 oz/week    1 Glasses of wine per week     Comment: 1 glass wine per week  . Drug Use: No  . Sexually Active: Not on file   Other Topics Concern  . Not on file   Social History Narrative   Originally from [place],  States father worked at [employer].  Moved from [place], [place]. On 6/21 to live with father.      Family History  Problem Relation Age of Onset  . Kidney failure Mother   . Kidney failure Maternal Uncle     ROS: [x]  Positive   [ ]  Negative   [ ]  All sytems reviewed and are negative  Cardiovascular: [x]  HTN []  chest pain/pressure []  palpitations [x]  SOB lying flat []  DOE []  pain in legs while walking []  pain  in feet when lying flat []  hx of DVT []  hx of phlebitis [x]  swelling in legs []  varicose veins  Pulmonary: [x]  SOB [X]  Orthopnea []  productive cough []  asthma []  wheezing  Neurologic: []  weakness in []  arms []  legs []  numbness in []  arms []  legs [] difficulty speaking or slurred speech []  temporary loss of vision in one eye []  dizziness  Hematologic: [x]  HIV [x]  hx anemia [x]  hx of bright red blood per rectum with hx of hemorrhoids []  bleeding problems []  problems with blood clotting easily  Endocrine:   []  diabetes []  thyroid disease  GI []  vomiting blood []  blood in stool  GU: []  burning with urination []  blood in urine  Psychiatric: []  hx of major depression  Integumentary: []  rashes []  ulcers  Constitutional: []  fever []  chills [x]  fatigue   Physical Examination  Filed Vitals:   05/05/13 0830  BP: 151/82  Pulse: 101  Temp: 97.2  F (36.2 C)  Resp: 18   Body mass index is 25.05 kg/(m^2).  General:  WDWN in NAD Gait: Normal HENT: WNL, normocephalic Eyes: Pupils equal Pulmonary: normal non-labored breathing , without Rales, rhonchi,  wheezing Cardiac: RRR, without  Murmurs, rubs or gallops; without carotid bruits Abdomen: soft, NT, no masses Skin: without rashes, without ulcers  Vascular Exam/Pulses:+ palpable radial pulses bilaterally; + palpable femoral pulses as well as DP bilaterally Extremities: without ischemic changes, without Gangrene , without cellulitis; without open wounds;  Musculoskeletal: no muscle wasting or atrophy  Neurologic: A&O X 3; Appropriate Affect ; SENSATION: normal; MOTOR FUNCTION:  moving all extremities equally. Speech is fluent/normal   CBC    Component Value Date/Time   WBC 4.0 05/05/2013 0545   RBC 3.00* 05/05/2013 0545   HGB 9.3* 05/05/2013 0545   HCT 28.4* 05/05/2013 0545   PLT 239 05/05/2013 0545   MCV 94.7 05/05/2013 0545   MCH 31.0 05/05/2013 0545   MCHC 32.7 05/05/2013 0545   RDW 16.0* 05/05/2013 0545   LYMPHSABS 1.3 05/03/2013 2230   MONOABS 0.5 05/03/2013 2230   EOSABS 0.5 05/03/2013 2230   BASOSABS 0.0 05/03/2013 2230    BMET    Component Value Date/Time   NA 138 05/05/2013 0545   K 5.4* 05/05/2013 0545   CL 104 05/05/2013 0545   CO2 20 05/05/2013 0545   GLUCOSE 64* 05/05/2013 0545   BUN 41* 05/05/2013 0545   CREATININE 13.27* 05/05/2013 0545   CALCIUM 7.5* 05/05/2013 0545   GFRNONAA 4* 05/05/2013 0545   GFRAA 5* 05/05/2013 0545     Non-Invasive Vascular Imaging:  Vein mapping ordered    ASSESSMENT/PLAN: This is a 37 y.o. male with ESRD in need of permanent HD access  -will try to obtain vein mapping today.  Nephrologist from [place] told him that his best option for access would be a left radio cephalic AVF.  He has not had any needle sticks to this arm. -the pt will be scheduled for Friday by Dr. Evonnie Dawes vein mapping will be completed tomorrow as the pt is already in  HD now.   Leontine Locket, PA-C [facility] [phone]  Plan avf Friday

## 2013-05-05 NOTE — Progress Notes (Signed)
Subjective: He reports increased diuresis overnight after Lasix administration. He continues to have mild shortness of breath. He had increased diarrhea last night with some abdominal cramping after Kayexalate treatment but denies blood in his stool or melena.  He denies chest pain, abdominal pain.  Objective: Vital signs in last 24 hours: Filed Vitals:   05/04/13 1645 05/04/13 2210 05/05/13 0446 05/05/13 0830  BP: 134/86 140/90 168/112 151/82  Pulse: 88 108 107 101  Temp: 98.6 F (37 C) 97.9 F (36.6 C) 97.7 F (36.5 C) 97.2 F (36.2 C)  TempSrc: Oral Oral Oral Oral  Resp: 16 18 18 18   Height:      Weight:      SpO2: 98% 100% 100% 100%   Weight change: 7 lb 6.4 oz (3.357 kg)  Intake/Output Summary (Last 24 hours) at 05/05/13 1116 Last data filed at 05/05/13 0835  Gross per 24 hour  Intake    240 ml  Output      0 ml  Net    240 ml   Vitals reviewed. General:Sitting in chair, in NAD HEENT: no scleral icterus Chest: Tunneled cath in Left upper chest area above with puncture mark, mild edema. Site of HD cath with no surrounding edema or erythema.  Cardiac: RRR, no rubs, murmurs or gallops Pulm: clear to auscultation bilaterally, no wheezes, rales, or rhonchi Abd: soft, nontender, nondistended, BS present Ext: warm and well perfused, trace pedal edema bilaterally. Neuro: alert and oriented X3, cranial nerves II-XII grossly intact, strength and sensation to light touch equal in bilateral upper and lower extremities  Lab Results: Basic Metabolic Panel:  Recent Labs Lab 05/03/13 2230 05/04/13 1716 05/04/13 2158 05/05/13 0545  NA 141 139 138 138  K 5.3* 6.0* 5.1 5.4*  CL 107 107 105 104  CO2 26 20 24 20   GLUCOSE 74 69* 164* 64*  BUN 35* 40* 40* 41*  CREATININE 10.88* 12.42* 12.50* 13.27*  CALCIUM 7.7* 7.7* 7.5* 7.5*  MG  --  2.5  --   --   PHOS  --  4.9*  --  5.0*   Liver Function Tests:  Recent Labs Lab 05/03/13 2230 05/05/13 0545  AST 33  --   ALT 24   --   ALKPHOS 87  --   BILITOT 0.1*  --   PROT 6.2  --   ALBUMIN 1.8* 1.7*   CBC:  Recent Labs Lab 05/03/13 2230 05/05/13 0545  WBC 4.6 4.0  NEUTROABS 2.4  --   HGB 9.4* 9.3*  HCT 28.6* 28.4*  MCV 96.0 94.7  PLT 246 239   Anemia Panel:  Recent Labs Lab 05/04/13 1716  VITAMINB12 579  FOLATE 16.9  FERRITIN 485*  TIBC 175*  IRON 46  RETICCTPCT 2.0    Micro Results: Recent Results (from the past 240 hour(s))  MRSA PCR SCREENING     Status: None   Collection Time    05/04/13  6:31 PM      Result Value Range Status   MRSA by PCR NEGATIVE  NEGATIVE Final   Comment:            The GeneXpert MRSA Assay (FDA     approved for NASAL specimens     only), is one component of a     comprehensive MRSA colonization     surveillance program. It is not     intended to diagnose MRSA     infection nor to guide or     monitor treatment  for     MRSA infections.   Studies/Results: X-ray Chest Pa And Lateral   05/05/2013   *RADIOLOGY REPORT*  Clinical Data: Shortness of breath, hypertension, dialysis patient  CHEST - 2 VIEW  Comparison: 05/04/2013  Findings: Left side dialysis catheter tip projects over SVC. Enlargement of cardiac silhouette. Mediastinal contours and pulmonary vascularity normal. Mild chronic peribronchial thickening. No acute infiltrate, pleural effusion or pneumothorax. Bones unremarkable.  IMPRESSION: Enlargement of cardiac silhouette. Mild chronic bronchitic changes.   Original Report Authenticated By: Lavonia Dana, M.D.   Dg Chest 2 View  05/04/2013   *RADIOLOGY REPORT*  Clinical Data: Shortness of breath, dialysis patient.  CHEST - 2 VIEW  Comparison: None.  Findings: Left dialysis catheter is in place with the tip at the cavoatrial junction.  Mild cardiomegaly.  Mild peribronchial thickening.  No confluent airspace opacities, effusions or edema. No acute bony abnormality.  IMPRESSION: Cardiomegaly.  Mild peribronchial thickening.   Original Report Authenticated By:  Rolm Baptise, M.D.   Medications: I have reviewed the patient's current medications. Scheduled Meds: . aspirin EC  81 mg Oral Daily  . atovaquone  1,500 mg Oral Daily  . [START ON 05/07/2013] azithromycin  1,200 mg Oral Q Fri  . carvedilol  6.25 mg Oral BID WC  . heparin  5,000 Units Subcutaneous Q8H  . losartan  50 mg Oral Daily  . sodium chloride  3 mL Intravenous Q12H  . sulfamethoxazole-trimethoprim  1 tablet Oral BID   Continuous Infusions:  PRN Meds:.acetaminophen, acetaminophen, ondansetron (ZOFRAN) IV, ondansetron Assessment/Plan:  1. ESRD on dialysis: Friday 6/20 is his last HD. He appears mildly volume overloaded today with mild shortness of breath but his K is trending up despite Lasix IV and kayexalate. Nephrology consulted, he will likely undergo HD today.  - Per Vascular Surgery, patient to follow up as outpatient for vein mapping and AVF planing - Care management consult for assistance with outpatient HD placement  - intact PTH to assess for secondary hyperparathyroidism.   2. Hyperkalemia: Mild at 5.3.on presentation with mild peaked T waves on EKG. K up to 6 last night, improved to 5.1 after Lasix and Kayexalate 30 g once, back to 5.4 today.  - Hemodialysis today  3. HIV disease: He has newly diagnosed HIV and states that he knows his CD4 count is "low." He states that he has been taking his OIs including atovaquone, Bactrim, and Azithromycin. He tells me that his ID doctor in [place] instructed him to follow up with an ID physician of his choosing here so they could do genome testing before he started anti-retroviral therapy.  - Repeat CD4 and HIV viral load with reflex to genotype  - He needs [facility] follow up as outpatient.   4. Normocytic anemia: HgB on admission today was 9.4, baseline Hg is unknown. Hg stable this morning at 9.3. Will check iron stores and consider EPO therapy per renals recommendations   5. HTN: Continue home medications.   6. VTE: Heparin  Dispo:  Disposition is deferred at this time, awaiting improvement of current medical problems.  Anticipated discharge in approximately 1-2 day(s).   The patient does not have a current PCP and will need an [facility] hospital follow-up appointment after discharge.  The patient does not have transportation limitations that hinder transportation to clinic appointments.  .Services Needed at time of discharge: Y = Yes, Blank = No PT:   OT:   RN:   Equipment:   Other:     LOS: 2  days   Blain Pais, MD 05/05/2013, 11:16 AM

## 2013-05-05 NOTE — Consult Note (Signed)
37 year old male with new onset ESRD presenting with severe hypertension and proteinuria at [facility] in [place].  He was diagnosed with HIV/AIDS(CD4 50) in April. He relocated to [facility] to live with father.  There apparently were some arrangements made for access placement but logistical and medical issues and scheduling precluded placement.  He presented to [facility] in need of dialysis since his last treatment was Friday.  He will need a primary care, infectious disease and hemodialysis center established.  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   Of note his mother , Diyor Mcleish, was a patent of our who is deceased.    Past Medical History  Diagnosis Date  . Hypertension   . Dialysis patient   . Renal disorder    History reviewed. No pertinent past surgical history. Social History:  reports that he has never smoked. He does not have any smokeless tobacco history on file. He reports that he drinks about 0.6 ounces of alcohol per week. He reports that he does not use illicit drugs.   Allergies:  Allergies  Allergen Reactions  . Codeine   . Eggs Or Egg-Derived Products   . Mercury   . Shellfish Allergy    Family History  Problem Relation Age of Onset  . Kidney failure Mother   . Kidney failure Maternal Uncle     Medications:  Scheduled: . aspirin EC  81 mg Oral Daily  . atovaquone  1,500 mg Oral Daily  . [START ON 05/07/2013] azithromycin  1,200 mg Oral Q Fri  . carvedilol  6.25 mg Oral BID WC  . heparin  5,000 Units Subcutaneous Q8H  . losartan  50 mg Oral Daily  . sodium chloride  3 mL Intravenous Q12H  . sulfamethoxazole-trimethoprim  1 tablet Oral BID   ROS: essentially neg  Blood pressure 151/82, pulse 101, temperature 97.2 F (36.2 C), temperature source Oral, resp. rate 18, height 5' 7.5" (1.715 m), weight 73.664 kg (162 lb 6.4 oz), SpO2 100.00%.  General appearance: alert and  cooperative Head: Normocephalic, without obvious abnormality, atraumatic Eyes: negative Ears: normal TM's and external ear canals both ears Nose: Nares normal. Septum midline. Mucosa normal. No drainage or sinus tenderness. Throat: lips, mucosa, and tongue normal; teeth and gums normal Resp: clear to auscultation bilaterally Chest wall: no tenderness  LCW PC Cardio: regular rate and rhythm, S1, S2 normal, no murmur, click, rub or gallop GI: soft, non-tender; bowel sounds normal; no masses,  no organomegaly Extremities: edema 1-2+ Skin: Skin color, texture, turgor normal. No rashes or lesions Neurologic: Grossly normal Results for orders placed during the hospital encounter of 05/03/13 (from the past 48 hour(s))  CBC WITH DIFFERENTIAL     Status: Abnormal   Collection Time    05/03/13 10:30 PM      Result Value Range   WBC 4.6  4.0 - 10.5 K/uL   RBC 2.98 (*) 4.22 - 5.81 MIL/uL   Hemoglobin 9.4 (*) 13.0 - 17.0 g/dL   HCT 28.6 (*) 39.0 - 52.0 %   MCV 96.0  78.0 - 100.0 fL   MCH 31.5  26.0 - 34.0 pg   MCHC 32.9  30.0 - 36.0 g/dL   RDW 16.0 (*) 11.5 - 15.5 %   Platelets 246  150 - 400 K/uL   Neutrophils Relative % 53  43 - 77 %   Neutro Abs 2.4  1.7 - 7.7 K/uL  Lymphocytes Relative 28  12 - 46 %   Lymphs Abs 1.3  0.7 - 4.0 K/uL   Monocytes Relative 10  3 - 12 %   Monocytes Absolute 0.5  0.1 - 1.0 K/uL   Eosinophils Relative 10 (*) 0 - 5 %   Eosinophils Absolute 0.5  0.0 - 0.7 K/uL   Basophils Relative 0  0 - 1 %   Basophils Absolute 0.0  0.0 - 0.1 K/uL  COMPREHENSIVE METABOLIC PANEL     Status: Abnormal   Collection Time    05/03/13 10:30 PM      Result Value Range   Sodium 141  135 - 145 mEq/L   Potassium 5.3 (*) 3.5 - 5.1 mEq/L   Chloride 107  96 - 112 mEq/L   CO2 26  19 - 32 mEq/L   Glucose, Bld 74  70 - 99 mg/dL   BUN 35 (*) 6 - 23 mg/dL   Creatinine, Ser 10.88 (*) 0.50 - 1.35 mg/dL   Calcium 7.7 (*) 8.4 - 10.5 mg/dL   Total Protein 6.2  6.0 - 8.3 g/dL   Albumin 1.8  (*) 3.5 - 5.2 g/dL   AST 33  0 - 37 U/L   ALT 24  0 - 53 U/L   Alkaline Phosphatase 87  39 - 117 U/L   Total Bilirubin 0.1 (*) 0.3 - 1.2 mg/dL   GFR calc non Af Amer 5 (*) >90 mL/min   GFR calc Af Amer 6 (*) >90 mL/min   Comment:            The eGFR has been calculated     using the CKD EPI equation.     This calculation has not been     validated in all clinical     situations.     eGFR's persistently     <90 mL/min signify     possible Chronic Kidney Disease.  HEPATITIS B SURFACE ANTIGEN     Status: None   Collection Time    05/04/13  1:30 AM      Result Value Range   Hepatitis B Surface Ag NEGATIVE  NEGATIVE  PHOSPHORUS     Status: Abnormal   Collection Time    05/04/13  5:16 PM      Result Value Range   Phosphorus 4.9 (*) 2.3 - 4.6 mg/dL  MAGNESIUM     Status: None   Collection Time    05/04/13  5:16 PM      Result Value Range   Magnesium 2.5  1.5 - 2.5 mg/dL  T-HELPER CELLS (CD4) COUNT     Status: Abnormal   Collection Time    05/04/13  5:16 PM      Result Value Range   CD4 T Cell Abs 120 (*) 400 - 2700 cmm   CD4 % Helper T Cell 7 (*) 33 - 55 %  BASIC METABOLIC PANEL     Status: Abnormal   Collection Time    05/04/13  5:16 PM      Result Value Range   Sodium 139  135 - 145 mEq/L   Potassium 6.0 (*) 3.5 - 5.1 mEq/L   Chloride 107  96 - 112 mEq/L   CO2 20  19 - 32 mEq/L   Glucose, Bld 69 (*) 70 - 99 mg/dL   BUN 40 (*) 6 - 23 mg/dL   Creatinine, Ser 12.42 (*) 0.50 - 1.35 mg/dL   Calcium 7.7 (*) 8.4 - 10.5 mg/dL  GFR calc non Af Amer 4 (*) >90 mL/min   GFR calc Af Amer 5 (*) >90 mL/min   Comment:            The eGFR has been calculated     using the CKD EPI equation.     This calculation has not been     validated in all clinical     situations.     eGFR's persistently     <90 mL/min signify     possible Chronic Kidney Disease.  PARATHYROID HORMONE, INTACT (NO CA)     Status: Abnormal   Collection Time    05/04/13  5:16 PM      Result Value Range    PTH 241.8 (*) 14.0 - 72.0 pg/mL  VITAMIN B12     Status: None   Collection Time    05/04/13  5:16 PM      Result Value Range   Vitamin B-12 579  211 - 911 pg/mL  FOLATE     Status: None   Collection Time    05/04/13  5:16 PM      Result Value Range   Folate 16.9     Comment: (NOTE)     Reference Ranges            Deficient:       0.4 - 3.3 ng/mL            Indeterminate:   3.4 - 5.4 ng/mL            Normal:              > 5.4 ng/mL  IRON AND TIBC     Status: Abnormal   Collection Time    05/04/13  5:16 PM      Result Value Range   Iron 46  42 - 135 ug/dL   TIBC 175 (*) 215 - 435 ug/dL   Saturation Ratios 26  20 - 55 %   UIBC 129  125 - 400 ug/dL  FERRITIN     Status: Abnormal   Collection Time    05/04/13  5:16 PM      Result Value Range   Ferritin 485 (*) 22 - 322 ng/mL  RETICULOCYTES     Status: Abnormal   Collection Time    05/04/13  5:16 PM      Result Value Range   Retic Ct Pct 2.0  0.4 - 3.1 %   RBC. 3.05 (*) 4.22 - 5.81 MIL/uL   Retic Count, Manual 61.0  19.0 - 186.0 K/uL  MRSA PCR SCREENING     Status: None   Collection Time    05/04/13  6:31 PM      Result Value Range   MRSA by PCR NEGATIVE  NEGATIVE   Comment:            The GeneXpert MRSA Assay (FDA     approved for NASAL specimens     only), is one component of a     comprehensive MRSA colonization     surveillance program. It is not     intended to diagnose MRSA     infection nor to guide or     monitor treatment for     MRSA infections.  BASIC METABOLIC PANEL     Status: Abnormal   Collection Time    05/04/13  9:58 PM      Result Value Range   Sodium 138  135 - 145 mEq/L  Potassium 5.1  3.5 - 5.1 mEq/L   Chloride 105  96 - 112 mEq/L   CO2 24  19 - 32 mEq/L   Glucose, Bld 164 (*) 70 - 99 mg/dL   BUN 40 (*) 6 - 23 mg/dL   Creatinine, Ser 12.50 (*) 0.50 - 1.35 mg/dL   Calcium 7.5 (*) 8.4 - 10.5 mg/dL   GFR calc non Af Amer 4 (*) >90 mL/min   GFR calc Af Amer 5 (*) >90 mL/min   Comment:             The eGFR has been calculated     using the CKD EPI equation.     This calculation has not been     validated in all clinical     situations.     eGFR's persistently     <90 mL/min signify     possible Chronic Kidney Disease.  CBC     Status: Abnormal   Collection Time    05/05/13  5:45 AM      Result Value Range   WBC 4.0  4.0 - 10.5 K/uL   RBC 3.00 (*) 4.22 - 5.81 MIL/uL   Hemoglobin 9.3 (*) 13.0 - 17.0 g/dL   HCT 28.4 (*) 39.0 - 52.0 %   MCV 94.7  78.0 - 100.0 fL   MCH 31.0  26.0 - 34.0 pg   MCHC 32.7  30.0 - 36.0 g/dL   RDW 16.0 (*) 11.5 - 15.5 %   Platelets 239  150 - 400 K/uL  RENAL FUNCTION PANEL     Status: Abnormal   Collection Time    05/05/13  5:45 AM      Result Value Range   Sodium 138  135 - 145 mEq/L   Potassium 5.4 (*) 3.5 - 5.1 mEq/L   Chloride 104  96 - 112 mEq/L   CO2 20  19 - 32 mEq/L   Glucose, Bld 64 (*) 70 - 99 mg/dL   BUN 41 (*) 6 - 23 mg/dL   Creatinine, Ser 13.27 (*) 0.50 - 1.35 mg/dL   Calcium 7.5 (*) 8.4 - 10.5 mg/dL   Phosphorus 5.0 (*) 2.3 - 4.6 mg/dL   Albumin 1.7 (*) 3.5 - 5.2 g/dL   GFR calc non Af Amer 4 (*) >90 mL/min   GFR calc Af Amer 5 (*) >90 mL/min   Comment:            The eGFR has been calculated     using the CKD EPI equation.     This calculation has not been     validated in all clinical     situations.     eGFR's persistently     <90 mL/min signify     possible Chronic Kidney Disease.   X-ray Chest Pa And Lateral   05/05/2013   *RADIOLOGY REPORT*  Clinical Data: Shortness of breath, hypertension, dialysis patient  CHEST - 2 VIEW  Comparison: 05/04/2013  Findings: Left side dialysis catheter tip projects over SVC. Enlargement of cardiac silhouette. Mediastinal contours and pulmonary vascularity normal. Mild chronic peribronchial thickening. No acute infiltrate, pleural effusion or pneumothorax. Bones unremarkable.  IMPRESSION: Enlargement of cardiac silhouette. Mild chronic bronchitic changes.   Original Report  Authenticated By: Lavonia Dana, M.D.   Dg Chest 2 View  05/04/2013   *RADIOLOGY REPORT*  Clinical Data: Shortness of breath, dialysis patient.  CHEST - 2 VIEW  Comparison: None.  Findings: Left dialysis catheter is in place with the tip  at the cavoatrial junction.  Mild cardiomegaly.  Mild peribronchial thickening.  No confluent airspace opacities, effusions or edema. No acute bony abnormality.  IMPRESSION: Cardiomegaly.  Mild peribronchial thickening.   Original Report Authenticated By: Rolm Baptise, M.D.    Assessment:  1 ESRD 2 HIV without HAART 3 Volume Overload 4 Hypertension 5 Anemia 6 Metabolic bone disease: PTH 241pg/ml  Plan: 1 Hemodialysis today for volume, toxins and BP control 2 I have asked VVS to help with AV access placement  3 Begin erythropoietin & give iron 4 Need to establish ID doctor and treatment 5 Clip pt for out patient HD  Luberta Grabinski C 05/05/2013, 1:06 PM

## 2013-05-06 DIAGNOSIS — Z01810 Encounter for preprocedural cardiovascular examination: Secondary | ICD-10-CM

## 2013-05-06 DIAGNOSIS — D649 Anemia, unspecified: Secondary | ICD-10-CM

## 2013-05-06 DIAGNOSIS — B20 Human immunodeficiency virus [HIV] disease: Secondary | ICD-10-CM

## 2013-05-06 DIAGNOSIS — R7881 Bacteremia: Secondary | ICD-10-CM

## 2013-05-06 DIAGNOSIS — I10 Essential (primary) hypertension: Secondary | ICD-10-CM

## 2013-05-06 LAB — RENAL FUNCTION PANEL
CO2: 29 mEq/L (ref 19–32)
Calcium: 7.3 mg/dL — ABNORMAL LOW (ref 8.4–10.5)
Chloride: 104 mEq/L (ref 96–112)
GFR calc Af Amer: 10 mL/min — ABNORMAL LOW (ref >90)
GFR calc non Af Amer: 8 mL/min — ABNORMAL LOW (ref >90)
Potassium: 5.1 mEq/L (ref 3.5–5.1)
Sodium: 138 mEq/L (ref 135–145)

## 2013-05-06 LAB — CBC
Hemoglobin: 9.1 g/dL — ABNORMAL LOW (ref 13.0–17.0)
MCH: 31.4 pg (ref 26.0–34.0)
Platelets: 186 10*3/uL (ref 150–400)
RBC: 2.9 MIL/uL — ABNORMAL LOW (ref 4.22–5.81)
WBC: 2.8 10*3/uL — ABNORMAL LOW (ref 4.0–10.5)

## 2013-05-06 MED ORDER — PNEUMOCOCCAL VAC POLYVALENT 25 MCG/0.5ML IJ INJ
0.5000 mL | INJECTION | INTRAMUSCULAR | Status: AC
  Filled 2013-05-06: qty 0.5

## 2013-05-06 NOTE — Progress Notes (Signed)
Patient ID: Dylan Barber, male   DOB: Aug 14, 1976, 37 y.o.   MRN: [id number]  left radial to cephalic A-V fistula creation by Dr. early in the a.m. patient ready to proceed

## 2013-05-06 NOTE — Progress Notes (Signed)
Assessment:  1 ESRD  2 HIV without HAART  3 Volume Overload  4 Hypertension  5 Anemia  6 Metabolic bone disease: PTH 241pg/ml   Plan:  1 Hemodialysis Friday after surgery 2 Need to establish ID doctor and treatment  3 Awaiting Clip pt for out patient HD  Subjective: Interval History: VVS planning AV access in am.  Objective: Vital signs in last 24 hours: Temp:  [97.9 F (36.6 C)-98.1 F (36.7 C)] 97.9 F (36.6 C) (06/26 0458) Pulse Rate:  [78-116] 86 (06/26 0458) Resp:  [16-19] 16 (06/26 0458) BP: (136-185)/(96-125) 136/96 mmHg (06/26 0458) SpO2:  [99 %-100 %] 99 % (06/26 0458) Weight:  [68.3 kg (150 lb 9.2 oz)-72 kg (158 lb 11.7 oz)] 68.3 kg (150 lb 9.2 oz) (06/25 1845) Weight change: -1.664 kg (-3 lb 10.7 oz)  Intake/Output from previous day: 06/25 0701 - 06/26 0700 In: 480 [P.O.:480] Out: 4707  Intake/Output this shift:   General appearance: alert and cooperative Extremities: edema pedal Lab Results:  Recent Labs  05/05/13 0545 05/06/13 0503  WBC 4.0 2.8*  HGB 9.3* 9.1*  HCT 28.4* 27.8*  PLT 239 186   BMET:  Recent Labs  05/05/13 0545 05/06/13 0503  NA 138 138  K 5.4* 5.1  CL 104 104  CO2 20 29  GLUCOSE 64* 91  BUN 41* 20  CREATININE 13.27* 7.33*  CALCIUM 7.5* 7.3*    Recent Labs  05/04/13 1716  PTH 241.8*   Iron Studies:  Recent Labs  05/04/13 1716  IRON 46  TIBC 175*  FERRITIN 485*   Studies/Results: X-ray Chest Pa And Lateral   05/05/2013   *RADIOLOGY REPORT*  Clinical Data: Shortness of breath, hypertension, dialysis patient  CHEST - 2 VIEW  Comparison: 05/04/2013  Findings: Left side dialysis catheter tip projects over SVC. Enlargement of cardiac silhouette. Mediastinal contours and pulmonary vascularity normal. Mild chronic peribronchial thickening. No acute infiltrate, pleural effusion or pneumothorax. Bones unremarkable.  IMPRESSION: Enlargement of cardiac silhouette. Mild chronic bronchitic changes.   Original Report  Authenticated By: Lavonia Dana, M.D.   Scheduled: . aspirin EC  81 mg Oral Daily  . atovaquone  1,500 mg Oral Daily  . [START ON 05/07/2013] azithromycin  1,200 mg Oral Q Fri  . carvedilol  6.25 mg Oral BID WC  . [START ON 05/07/2013] cefUROXime (ZINACEF)  IV  1.5 g Intravenous On Call to OR  . [START ON 05/07/2013] ferric gluconate (FERRLECIT/NULECIT) IV  125 mg Intravenous Q M,W,F-HD  . heparin  5,000 Units Subcutaneous Q8H  . losartan  50 mg Oral Daily  . sodium chloride  3 mL Intravenous Q12H  . sulfamethoxazole-trimethoprim  1 tablet Oral BID     LOS: 3 days   Rebecca Cairns C 05/06/2013,10:25 AM

## 2013-05-06 NOTE — ED Provider Notes (Signed)
Medical screening examination/treatment/procedure(s) were performed by non-physician practitioner and as supervising physician I was immediately available for consultation/collaboration.   Elyn Peers, MD 05/06/13 [phone]

## 2013-05-06 NOTE — Progress Notes (Signed)
Subjective: He fells much better today after HD yesterday. He has no shortness of breath today.   Objective: Vital signs in last 24 hours: Filed Vitals:   05/05/13 1850 05/05/13 2114 05/05/13 2118 05/06/13 0458  BP: 153/102 142/109 140/103 136/96  Pulse: 95 114 95 86  Temp:  98 F (36.7 C)  97.9 F (36.6 C)  TempSrc:  Oral  Oral  Resp: 16 18  16   Height:      Weight:      SpO2:  100%  99%   Weight change: -3 lb 10.7 oz (-1.664 kg)  Intake/Output Summary (Last 24 hours) at 05/06/13 1202 Last data filed at 05/06/13 0600  Gross per 24 hour  Intake    240 ml  Output   4707 ml  Net  -4467 ml   Vitals reviewed.  General: Sleeping as I enter the room, in NAD  HEENT: no scleral icterus  Chest: Tunneled cath in Left upper chest area above with puncture mark, mild edema. Site of HD cath with no surrounding edema or erythema.  Cardiac: RRR, no rubs, murmurs or gallops  Pulm: clear to auscultation bilaterally, no wheezes, rales, or rhonchi  Abd: soft, nontender, nondistended, BS present  Ext: warm and well perfused, trace pedal edema bilaterally.  Neuro: alert and oriented X3, cranial nerves II-XII grossly intact, strength and sensation to light touch equal in bilateral upper and lower extremities   Lab Results: Basic Metabolic Panel:  Recent Labs Lab 05/03/13 2230  05/04/13 1716  05/05/13 0545 05/06/13 0503  NA 141  --  139  < > 138 138  K 5.3*  --  6.0*  < > 5.4* 5.1  CL 107  --  107  < > 104 104  CO2 26  --  20  < > 20 29  GLUCOSE 74  --  69*  < > 64* 91  BUN 35*  --  40*  < > 41* 20  CREATININE 10.88*  --  12.42*  < > 13.27* 7.33*  CALCIUM 7.7*  --  7.7*  < > 7.5* 7.3*  MG  --   --  2.5  --   --   --   PHOS  --   < > 4.9*  --  5.0* 3.9  < > = values in this interval not displayed. Liver Function Tests:  Recent Labs Lab 05/03/13 2230 05/05/13 0545 05/06/13 0503  AST 33  --   --   ALT 24  --   --   ALKPHOS 87  --   --   BILITOT 0.1*  --   --   PROT 6.2   --   --   ALBUMIN 1.8* 1.7* 1.6*   CBC:  Recent Labs Lab 05/03/13 2230 05/05/13 0545 05/06/13 0503  WBC 4.6 4.0 2.8*  NEUTROABS 2.4  --   --   HGB 9.4* 9.3* 9.1*  HCT 28.6* 28.4* 27.8*  MCV 96.0 94.7 95.9  PLT 246 239 186   Anemia Panel:  Recent Labs Lab 05/04/13 1716  VITAMINB12 579  FOLATE 16.9  FERRITIN 485*  TIBC 175*  IRON 46  RETICCTPCT 2.0    Micro Results: Recent Results (from the past 240 hour(s))  MRSA PCR SCREENING     Status: None   Collection Time    05/04/13  6:31 PM      Result Value Range Status   MRSA by PCR NEGATIVE  NEGATIVE Final   Comment:  The GeneXpert MRSA Assay (FDA     approved for NASAL specimens     only), is one component of a     comprehensive MRSA colonization     surveillance program. It is not     intended to diagnose MRSA     infection nor to guide or     monitor treatment for     MRSA infections.   Studies/Results: X-ray Chest Pa And Lateral   05/05/2013   *RADIOLOGY REPORT*  Clinical Data: Shortness of breath, hypertension, dialysis patient  CHEST - 2 VIEW  Comparison: 05/04/2013  Findings: Left side dialysis catheter tip projects over SVC. Enlargement of cardiac silhouette. Mediastinal contours and pulmonary vascularity normal. Mild chronic peribronchial thickening. No acute infiltrate, pleural effusion or pneumothorax. Bones unremarkable.  IMPRESSION: Enlargement of cardiac silhouette. Mild chronic bronchitic changes.   Original Report Authenticated By: Lavonia Dana, M.D.   Medications: I have reviewed the patient's current medications. Scheduled Meds: . aspirin EC  81 mg Oral Daily  . atovaquone  1,500 mg Oral Daily  . [START ON 05/07/2013] azithromycin  1,200 mg Oral Q Fri  . carvedilol  6.25 mg Oral BID WC  . [START ON 05/07/2013] cefUROXime (ZINACEF)  IV  1.5 g Intravenous On Call to OR  . [START ON 05/07/2013] ferric gluconate (FERRLECIT/NULECIT) IV  125 mg Intravenous Q M,W,F-HD  . heparin  5,000 Units  Subcutaneous Q8H  . losartan  50 mg Oral Daily  . sodium chloride  3 mL Intravenous Q12H  . sulfamethoxazole-trimethoprim  1 tablet Oral BID   Continuous Infusions:  PRN Meds:.acetaminophen, acetaminophen, ondansetron (ZOFRAN) IV, ondansetron Assessment/Plan:  1. ESRD on dialysis: Friday 6/20 was last HD prior to his presentation. He had HD on 6/25 and feels much better today.  - Per vascular surgery, vein mapping today and AVF placement on 6/27 by Dr. Donnetta Hutching - Appreciate Care Management assistance arranging outpatient HD for him: he has been accepted at [facility] with first appointment on June 30th.  - intact PTH elevated, metabolic bone disease   2. Hyperkalemia: Mild at 5.3.on presentation with mild peaked T waves on EKG. K up to 6 on 6/24, improved to 5.1 after Lasix and Kayexalate 30 g once. K of 5.1 this morning.   - Hemodialysis likely tomorrow after his AVF procedure  3. HIV disease: He has newly diagnosed HIV and states that he knows his CD4 count is "low." He states that he has been taking his OIs including atovaquone, Bactrim, and Azithromycin. He tells me that his ID doctor in [place] instructed him to follow up with an ID physician of his choosing here so they could do genome testing before he started anti-retroviral therapy.  - Repeat CD4 and HIV viral load with reflex to genotype  - He needs [facility] follow up as outpatient.  - ID consulted, appreciate recommendations. Ordered Hep C Ab w/reflex, Hep A Ab, Hep B core Ab, Hep B surface Ab. (Hep B surface antigen negative) -Discontinued atovaquone, continue Bactrim  4. Normocytic anemia: HgB on admission was 9.4, baseline Hg is unknown. Hg stable this morning at 9.1.  -Started on Epo and iron supplementation  5. HTN: Continue home medications.   6. VTE: Heparin  Dispo: Disposition is deferred at this time, awaiting improvement of current medical problems.  Anticipated discharge in approximately 2-3 day(s).   The patient does  not have a current PCP  and does need an [facility] hospital follow-up appointment after discharge.  The patient  does not have transportation limitations that hinder transportation to clinic appointments.  .Services Needed at time of discharge: Y = Yes, Blank = No PT:   OT:   RN:   Equipment:   Other:     LOS: 3 days   Blain Pais, MD 05/06/2013, 12:02 PM

## 2013-05-06 NOTE — Progress Notes (Signed)
05/06/2013 11:34 AM Hemodialysis Outpatient Note; this patient has been accepted at the [facility] on a Mon-Wed-Fri 2nd shift schedule. The patient can begin treatment on Monday June 30th at 22 AM. Social Work has been asked to advise patient of any public transportation options since his car is not available at this time. Thank you. Gordy Savers

## 2013-05-06 NOTE — Progress Notes (Addendum)
VASCULAR LAB PRELIMINARY  PRELIMINARY  PRELIMINARY  PRELIMINARY  Right  Upper Extremity Vein Map    Cephalic  Segment Diameter Depth Comment  1. Axilla 2.7mm mm   2. Mid upper arm 1.13mm mm   3. Above AC 2.37mm mm   4. In AC 2.94mm mm   5. Below AC 2.43mm mm   6. Mid forearm 2.54mm mm   7. Wrist 2.63mm mm    mm mm    mm mm    mm mm    Basilic  Segment Diameter Depth Comment  1. Axilla mm mm   2. Mid upper arm 3.66mm 15mm origin  3. Above [facility] 2.15mm 5.64mm branch  4. In [facility] 3.84mm 2.1mm   5. Below AC 2.29mm 1.76mm branch  6. Mid forearm 2.15mm 2.37mm   7. Wrist 1.82mm 2.39mm    mm mm    mm mm    mm mm        Left Upper Extremity Vein Map    Cephalic  Segment Diameter Depth Comment  1. Axilla 3.52mm mm   2. Mid upper arm 2.69mm mm   3. Above AC 4.36mm mm   4. In AC 3.2mm mm Partially thrombosed  5. Below AC 2.55mm mm   6. Mid forearm 2.36mm mm branch  7. Wrist 1.25mm mm branch   mm mm    mm mm    mm mm    Basilic  Segment Diameter Depth Comment  1. Axilla mm mm   2. Mid upper arm 3.8mm 13.53mm   3. Above [facility] 2.52mm 6.77mm branch  4. In [facility] 1.69mm 2.68mm   5. Below AC 1.61mm 2.60mm   6. Mid forearm 1.32mm 1.43mm   7. Wrist 1.84mm mm    mm mm    mm mm    mm mm    ++++ There is a heterogenous area located adjacent to the right basilic vein, just above the antecubital fossa. Etiology unknown.    Landry Mellow, RDMS, RVT  05/06/2013, 2:44 PM

## 2013-05-06 NOTE — Care Management Note (Signed)
   CARE MANAGEMENT NOTE 05/06/2013  Patient:  Dylan Barber, Dylan Barber   Account Number:  [id number]  Date Initiated:  05/05/2013  Documentation initiated by:  Jasmine Pang  Subjective/Objective Assessment:   request for assistance with outpatient hemodialysis.     Action/Plan:   Spoke with Dr Hayes Ludwig and Dr Florene Glen, who has agreed to work with this pt in getting vein mapping and hemodialysis access placed prior to d/c from the hospital . Pt will also be CLIPPED to an outpt hemodialysis center.   Anticipated DC Date:  05/09/2013   Anticipated DC Plan:  HOME/SELF CARE         Choice offered to / List presented to:             Status of service:  Completed, signed off Medicare Important Message given?   (If response is "NO", the following Medicare IM given date fields will be blank) Date Medicare IM given:   Date Additional Medicare IM given:    Discharge Disposition:  HOME/SELF CARE  Per UR Regulation:    If discussed at Long Length of Stay Meetings, dates discussed:    Comments:

## 2013-05-07 ENCOUNTER — Encounter (HOSPITAL_COMMUNITY)
Admission: EM | Disposition: A | Discharge status: Home / Self Care | Payer: Self-pay | Source: Home / Self Care | Attending: Emergency Medicine

## 2013-05-07 ENCOUNTER — Telehealth: Payer: Self-pay | Admitting: Vascular Surgery

## 2013-05-07 ENCOUNTER — Ambulatory Visit: Payer: Managed Care, Other (non HMO) | Admitting: Vascular Surgery

## 2013-05-07 ENCOUNTER — Observation Stay (HOSPITAL_COMMUNITY): Payer: Managed Care, Other (non HMO) | Admitting: Anesthesiology

## 2013-05-07 ENCOUNTER — Encounter (HOSPITAL_COMMUNITY): Payer: Self-pay | Admitting: Anesthesiology

## 2013-05-07 ENCOUNTER — Other Ambulatory Visit: Payer: Self-pay | Admitting: *Deleted

## 2013-05-07 DIAGNOSIS — N186 End stage renal disease: Secondary | ICD-10-CM

## 2013-05-07 DIAGNOSIS — Z4931 Encounter for adequacy testing for hemodialysis: Secondary | ICD-10-CM

## 2013-05-07 DIAGNOSIS — Z21 Asymptomatic human immunodeficiency virus [HIV] infection status: Secondary | ICD-10-CM

## 2013-05-07 DIAGNOSIS — E875 Hyperkalemia: Secondary | ICD-10-CM

## 2013-05-07 HISTORY — PX: AV FISTULA PLACEMENT: SHX1204

## 2013-05-07 LAB — CBC
Hemoglobin: 9.2 g/dL — ABNORMAL LOW (ref 13.0–17.0)
MCH: 31.1 pg (ref 26.0–34.0)
MCHC: 32.9 g/dL (ref 30.0–36.0)
MCV: 94.6 fL (ref 78.0–100.0)
RBC: 2.96 MIL/uL — ABNORMAL LOW (ref 4.22–5.81)

## 2013-05-07 LAB — RENAL FUNCTION PANEL
BUN: 26 mg/dL — ABNORMAL HIGH (ref 6–23)
CO2: 26 mEq/L (ref 19–32)
Calcium: 7.3 mg/dL — ABNORMAL LOW (ref 8.4–10.5)
Creatinine, Ser: 9.7 mg/dL — ABNORMAL HIGH (ref 0.50–1.35)
Glucose, Bld: 63 mg/dL — ABNORMAL LOW (ref 70–99)
Phosphorus: 4.6 mg/dL (ref 2.3–4.6)

## 2013-05-07 LAB — HEPATITIS B CORE ANTIBODY, IGM: Hep B C IgM: NEGATIVE

## 2013-05-07 SURGERY — ARTERIOVENOUS (AV) FISTULA CREATION
Anesthesia: Monitor Anesthesia Care | Site: Arm Lower | Laterality: Left | Wound class: Clean

## 2013-05-07 MED ORDER — MIDAZOLAM HCL 5 MG/5ML IJ SOLN
INTRAMUSCULAR | Status: DC | PRN
  Administered 2013-05-07: 2 mg via INTRAVENOUS

## 2013-05-07 MED ORDER — ARTIFICIAL TEARS OP OINT
TOPICAL_OINTMENT | OPHTHALMIC | Status: DC | PRN
  Administered 2013-05-07: 1 via OPHTHALMIC

## 2013-05-07 MED ORDER — HEPARIN SODIUM (PORCINE) 1000 UNIT/ML DIALYSIS: 1000.0000 [IU] | INTRAMUSCULAR | Status: DC | PRN

## 2013-05-07 MED ORDER — NEPRO/CARBSTEADY PO LIQD
237.0000 mL | ORAL | Status: DC | PRN
  Filled 2013-05-07: qty 237

## 2013-05-07 MED ORDER — SODIUM CHLORIDE 0.9 % IV SOLN: 100.0000 mL | INTRAVENOUS | Status: DC | PRN

## 2013-05-07 MED ORDER — LIDOCAINE HCL (PF) 1 % IJ SOLN: 5.0000 mL | INTRAMUSCULAR | Status: DC | PRN

## 2013-05-07 MED ORDER — ACETAMINOPHEN 325 MG PO TABS
ORAL_TABLET | ORAL | Status: AC
  Filled 2013-05-07: qty 2

## 2013-05-07 MED ORDER — OXYCODONE HCL 5 MG PO TABS
5.0000 mg | ORAL_TABLET | Freq: Four times a day (QID) | ORAL | Status: DC | PRN
  Administered 2013-05-07: 5 mg via ORAL
  Filled 2013-05-07: qty 1

## 2013-05-07 MED ORDER — PHENYLEPHRINE HCL 10 MG/ML IJ SOLN
INTRAMUSCULAR | Status: DC | PRN
  Administered 2013-05-07 (×5): 80 ug via INTRAVENOUS

## 2013-05-07 MED ORDER — LIDOCAINE HCL (CARDIAC) 20 MG/ML IV SOLN
INTRAVENOUS | Status: DC | PRN
  Administered 2013-05-07: 50 mg via INTRAVENOUS

## 2013-05-07 MED ORDER — LIDOCAINE-PRILOCAINE 2.5-2.5 % EX CREA: 1.0000 "application " | TOPICAL_CREAM | CUTANEOUS | Status: DC | PRN

## 2013-05-07 MED ORDER — LIDOCAINE-EPINEPHRINE 0.5 %-1:200000 IJ SOLN
INTRAMUSCULAR | Status: AC
  Filled 2013-05-07: qty 1

## 2013-05-07 MED ORDER — 0.9 % SODIUM CHLORIDE (POUR BTL) OPTIME
TOPICAL | Status: DC | PRN
  Administered 2013-05-07: 1000 mL

## 2013-05-07 MED ORDER — HEPARIN SODIUM (PORCINE) 1000 UNIT/ML DIALYSIS: 20.0000 [IU]/kg | INTRAMUSCULAR | Status: DC | PRN

## 2013-05-07 MED ORDER — FENTANYL CITRATE 0.05 MG/ML IJ SOLN
INTRAMUSCULAR | Status: DC | PRN
  Administered 2013-05-07: 100 ug via INTRAVENOUS
  Administered 2013-05-07: 25 ug via INTRAVENOUS

## 2013-05-07 MED ORDER — ALTEPLASE 2 MG IJ SOLR
2.0000 mg | Freq: Once | INTRAMUSCULAR | Status: DC | PRN
  Filled 2013-05-07: qty 2

## 2013-05-07 MED ORDER — FENTANYL CITRATE 0.05 MG/ML IJ SOLN: 25.0000 ug | INTRAMUSCULAR | Status: DC | PRN

## 2013-05-07 MED ORDER — ONDANSETRON HCL 4 MG/2ML IJ SOLN
INTRAMUSCULAR | Status: DC | PRN
  Administered 2013-05-07: 4 mg via INTRAVENOUS

## 2013-05-07 MED ORDER — PHENYLEPHRINE HCL 10 MG/ML IJ SOLN
10.0000 mg | INTRAVENOUS | Status: DC | PRN
  Administered 2013-05-07: 50 ug/min via INTRAVENOUS

## 2013-05-07 MED ORDER — SODIUM CHLORIDE 0.9 % IR SOLN
Status: DC | PRN
  Administered 2013-05-07: 07:00:00

## 2013-05-07 MED ORDER — CEFUROXIME SODIUM 1.5 G IJ SOLR
INTRAMUSCULAR | Status: AC
  Administered 2013-05-07: 07:00:00
  Filled 2013-05-07: qty 1.5

## 2013-05-07 MED ORDER — ONDANSETRON HCL 4 MG/2ML IJ SOLN: 4.0000 mg | Freq: Four times a day (QID) | INTRAMUSCULAR | Status: DC | PRN

## 2013-05-07 MED ORDER — LIDOCAINE-EPINEPHRINE 0.5 %-1:200000 IJ SOLN
INTRAMUSCULAR | Status: DC | PRN
  Administered 2013-05-07: 50 mL

## 2013-05-07 MED ORDER — DEXTROSE 5 % IV SOLN
INTRAVENOUS | Status: AC
  Administered 2013-05-07: 07:00:00
  Filled 2013-05-07: qty 50

## 2013-05-07 MED ORDER — PROPOFOL 10 MG/ML IV BOLUS
INTRAVENOUS | Status: DC | PRN
  Administered 2013-05-07: 180 mg via INTRAVENOUS

## 2013-05-07 MED ORDER — SODIUM CHLORIDE 0.9 % IV SOLN
INTRAVENOUS | Status: DC | PRN
  Administered 2013-05-07: 07:00:00 via INTRAVENOUS

## 2013-05-07 MED ORDER — PENTAFLUOROPROP-TETRAFLUOROETH EX AERO: 1.0000 "application " | INHALATION_SPRAY | CUTANEOUS | Status: DC | PRN

## 2013-05-07 SURGICAL SUPPLY — 36 items
APL SKNCLS STERI-STRIP NONHPOA (GAUZE/BANDAGES/DRESSINGS) ×1
ARMBAND PINK RESTRICT EXTREMIT (MISCELLANEOUS) ×2 IMPLANT
BENZOIN TINCTURE PRP APPL 2/3 (GAUZE/BANDAGES/DRESSINGS) ×2 IMPLANT
CANISTER SUCTION 2500CC (MISCELLANEOUS) ×2 IMPLANT
CLIP LIGATING EXTRA MED SLVR (CLIP) ×2 IMPLANT
CLIP LIGATING EXTRA SM BLUE (MISCELLANEOUS) ×2 IMPLANT
CLOSURE STERI-STRIP 1/4X4 (GAUZE/BANDAGES/DRESSINGS) ×1 IMPLANT
CLOTH BEACON ORANGE TIMEOUT ST (SAFETY) ×2 IMPLANT
COVER PROBE W GEL 5X96 (DRAPES) ×2 IMPLANT
COVER SURGICAL LIGHT HANDLE (MISCELLANEOUS) ×2 IMPLANT
DECANTER SPIKE VIAL GLASS SM (MISCELLANEOUS) ×2 IMPLANT
ELECT REM PT RETURN 9FT ADLT (ELECTROSURGICAL) ×2
ELECTRODE REM PT RTRN 9FT ADLT (ELECTROSURGICAL) ×1 IMPLANT
GEL ULTRASOUND 20GR AQUASONIC (MISCELLANEOUS) IMPLANT
GLOVE BIO SURGEON STRL SZ 6 (GLOVE) ×2 IMPLANT
GLOVE BIO SURGEON STRL SZ 6.5 (GLOVE) ×2 IMPLANT
GLOVE BIO SURGEON STRL SZ7 (GLOVE) ×1 IMPLANT
GLOVE BIOGEL PI IND STRL 6.5 (GLOVE) IMPLANT
GLOVE BIOGEL PI INDICATOR 6.5 (GLOVE) ×2
GLOVE SS BIOGEL STRL SZ 7.5 (GLOVE) ×1 IMPLANT
GLOVE SUPERSENSE BIOGEL SZ 7.5 (GLOVE) ×2
GOWN STRL NON-REIN LRG LVL3 (GOWN DISPOSABLE) ×6 IMPLANT
KIT BASIN OR (CUSTOM PROCEDURE TRAY) ×2 IMPLANT
KIT ROOM TURNOVER OR (KITS) ×2 IMPLANT
NS IRRIG 1000ML POUR BTL (IV SOLUTION) ×2 IMPLANT
PACK CV ACCESS (CUSTOM PROCEDURE TRAY) ×2 IMPLANT
PAD ARMBOARD 7.5X6 YLW CONV (MISCELLANEOUS) ×4 IMPLANT
SPONGE GAUZE 4X4 12PLY (GAUZE/BANDAGES/DRESSINGS) ×2 IMPLANT
STRIP CLOSURE SKIN 1/2X4 (GAUZE/BANDAGES/DRESSINGS) ×2 IMPLANT
SUT PROLENE 6 0 CC (SUTURE) ×2 IMPLANT
SUT VIC AB 3-0 SH 27 (SUTURE) ×2
SUT VIC AB 3-0 SH 27X BRD (SUTURE) ×1 IMPLANT
TOWEL OR 17X24 6PK STRL BLUE (TOWEL DISPOSABLE) ×2 IMPLANT
TOWEL OR 17X26 10 PK STRL BLUE (TOWEL DISPOSABLE) ×2 IMPLANT
UNDERPAD 30X30 INCONTINENT (UNDERPADS AND DIAPERS) ×2 IMPLANT
WATER STERILE IRR 1000ML POUR (IV SOLUTION) ×2 IMPLANT

## 2013-05-07 NOTE — Transfer of Care (Signed)
Immediate Anesthesia Transfer of Care Note  Patient: Dylan Barber  Procedure(s) Performed: Procedure(s): ARTERIOVENOUS (AV) FISTULA CREATION- LEFT (Left)  Patient Location: PACU  Anesthesia Type:General  Level of Consciousness: responds to stimulation  Airway & Oxygen Therapy: Patient Spontanous Breathing and Patient connected to nasal cannula oxygen  Post-op Assessment: Report given to PACU RN, Post -op Vital signs reviewed and stable and Patient moving all extremities  Post vital signs: Reviewed and stable  Complications: No apparent anesthesia complications

## 2013-05-07 NOTE — Telephone Encounter (Signed)
Spoke with pt, gave appt info. Pt aware that there is a 45 min wait between lab and md - kf

## 2013-05-07 NOTE — Consult Note (Signed)
[facility]     Reason for Consult: New 042    Referring Physician: Dr. Ellwood Dense  Principal Problem:   ESRD (end stage renal disease) on dialysis Active Problems:   HIV disease   Hyperkalemia   Normocytic anemia   HTN (hypertension)   . acetaminophen      . aspirin EC  81 mg Oral Daily  . azithromycin  1,200 mg Oral Q Fri  . carvedilol  6.25 mg Oral BID WC  . ferric gluconate (FERRLECIT/NULECIT) IV  125 mg Intravenous Q M,W,F-HD  . heparin  5,000 Units Subcutaneous Q8H  . losartan  50 mg Oral Daily  . pneumococcal 23 valent vaccine  0.5 mL Intramuscular Tomorrow-1000  . sodium chloride  3 mL Intravenous Q12H  . sulfamethoxazole-trimethoprim  1 tablet Oral BID    Recommendations: Continue Bactrim Will await genotype (about 2 weeks) before starting ARVs  We will arrange follow up at discharge.  I will be available over the weekend if needed, otherwise will follow up on Monday if still in house  Assessment: New 042, CD4 of 120 and viral load noted.  He is interested in treatment and discussed out clinic.  We will make arrangement to have him in clinic ASAP after discharge.  His cell phone number is [phone] and ok to call and ok to leave a message.     Antibiotics: Bactrim prophylaxis  HPI: Dylan Barber is a 37 y.o. male with ESRD diagnosed in January of this year in [place], and recent diagnosis of HIV/AIDS in April of this year (previous negative test about January of 2013) who moved here from [place] and went to ED to establish care and get dialysis.  He is now s/p fistula placement and getting dialyzed.  He is ARV naive and no history of genotype being done since it was known he was moving.  He apparently also was on Azithromycin prophylaxis in [place] for low CD4, though here it is 120.  No OIs, no other STIs.     Review of Systems: A comprehensive review of systems was negative.  Past Medical History  Diagnosis Date  .  Hypertension   . Dialysis patient   . Renal disorder     History  Substance Use Topics  . Smoking status: Never Smoker   . Smokeless tobacco: Not on file  . Alcohol Use: 0.6 oz/week    1 Glasses of wine per week     Comment: 1 glass wine per week    Family History  Problem Relation Age of Onset  . Kidney failure Mother   . Kidney failure Maternal Uncle    Allergies  Allergen Reactions  . Codeine   . Eggs Or Egg-Derived Products   . Mercury   . Shellfish Allergy     OBJECTIVE: Blood pressure 118/76, pulse 78, temperature 97.6 F (36.4 C), temperature source Oral, resp. rate 19, height 5' 7.5" (1.715 m), weight 158 lb 4.6 oz (71.8 kg), SpO2 100.00%. General: Awake, alert, nad Skin: no rashes, dialysis catheter without erythema Lungs: CTA B Cor: RRR without m/r/g Abdomen: soft, nt, nd, +bs Ext: no edema  Microbiology: Recent Results (from the past 240 hour(s))  MRSA PCR SCREENING     Status: None   Collection Time    05/04/13  6:31 PM      Result Value Range Status   MRSA by PCR NEGATIVE  NEGATIVE Final   Comment:  The GeneXpert MRSA Assay (FDA     approved for NASAL specimens     only), is one component of a     comprehensive MRSA colonization     surveillance program. It is not     intended to diagnose MRSA     infection nor to guide or     monitor treatment for     MRSA infections.    Scharlene Gloss, [facility] [place] [email or web address] [id number] pager  [phone] cell 05/07/2013, 3:37 PM

## 2013-05-07 NOTE — Progress Notes (Signed)
Subjective: He underwent placement of Left Cimino AV fistula earlier this morning with minimum blood loss. He states that he feels great.   Objective: Vital signs in last 24 hours: Filed Vitals:   05/07/13 0904 05/07/13 0919 05/07/13 0934 05/07/13 0949  BP: 127/84 117/87 115/78 116/75  Pulse: 87 77 81 76  Temp:      TempSrc:      Resp: 13 11 11 11   Height:      Weight:      SpO2: 96% 100% 100% 100%   Weight change: -2 lb 13.3 oz (-1.284 kg)  Intake/Output Summary (Last 24 hours) at 05/07/13 1035 Last data filed at 05/07/13 0851  Gross per 24 hour  Intake   1000 ml  Output      0 ml  Net   1000 ml   Vitals reviewed.  General: Lying in bed, in NAD  HEENT: no scleral icterus  Chest: Tunneled cath in Left upper chest area above with puncture mark, mild edema. Site of HD cath with no surrounding edema or erythema. Left distal forearm, site of AVF covered with bandage that is d/c/i Cardiac: RRR, no rubs, murmurs or gallops  Pulm: clear to auscultation bilaterally, no wheezes, rales, or rhonchi  Abd: soft, nontender, nondistended, BS present  Ext: warm and well perfused, trace pedal edema bilaterally.  Neuro: alert and oriented X3, cranial nerves II-XII grossly intact, strength and sensation to light touch equal in bilateral upper and lower extremities   Lab Results: Basic Metabolic Panel:  Recent Labs Lab 05/03/13 2230 05/04/13 1716  05/06/13 0503 05/07/13 0605  NA 141 139  < > 138 136  K 5.3* 6.0*  < > 5.1 5.6*  CL 107 107  < > 104 104  CO2 26 20  < > 29 26  GLUCOSE 74 69*  < > 91 63*  BUN 35* 40*  < > 20 26*  CREATININE 10.88* 12.42*  < > 7.33* 9.70*  CALCIUM 7.7* 7.7*  < > 7.3* 7.3*  MG  --  2.5  --   --   --   PHOS  --  4.9*  < > 3.9 4.6  < > = values in this interval not displayed. Liver Function Tests:  Recent Labs Lab 05/03/13 2230  05/06/13 0503 05/07/13 0605  AST 33  --   --   --   ALT 24  --   --   --   ALKPHOS 87  --   --   --   BILITOT 0.1*   --   --   --   PROT 6.2  --   --   --   ALBUMIN 1.8*  < > 1.6* 1.6*  < > = values in this interval not displayed. No results found for this basename: LIPASE, AMYLASE,  in the last 168 hours No results found for this basename: AMMONIA,  in the last 168 hours CBC:  Recent Labs Lab 05/03/13 2230  05/06/13 0503 05/07/13 0610  WBC 4.6  < > 2.8* 2.9*  NEUTROABS 2.4  --   --   --   HGB 9.4*  < > 9.1* 9.2*  HCT 28.6*  < > 27.8* 28.0*  MCV 96.0  < > 95.9 94.6  PLT 246  < > 186 167  < > = values in this interval not displayed. Anemia Panel:  Recent Labs Lab 05/04/13 1716  VITAMINB12 579  FOLATE 16.9  FERRITIN 485*  TIBC 175*  IRON 46  RETICCTPCT 2.0    Micro Results: Recent Results (from the past 240 hour(s))  MRSA PCR SCREENING     Status: None   Collection Time    05/04/13  6:31 PM      Result Value Range Status   MRSA by PCR NEGATIVE  NEGATIVE Final   Comment:            The GeneXpert MRSA Assay (FDA     approved for NASAL specimens     only), is one component of a     comprehensive MRSA colonization     surveillance program. It is not     intended to diagnose MRSA     infection nor to guide or     monitor treatment for     MRSA infections.   Studies/Results: No results found. Medications: I have reviewed the patient's current medications. Scheduled Meds: . aspirin EC  81 mg Oral Daily  . azithromycin  1,200 mg Oral Q Fri  . carvedilol  6.25 mg Oral BID WC  . cefUROXime      . dextrose      . ferric gluconate (FERRLECIT/NULECIT) IV  125 mg Intravenous Q M,W,F-HD  . heparin  5,000 Units Subcutaneous Q8H  . losartan  50 mg Oral Daily  . pneumococcal 23 valent vaccine  0.5 mL Intramuscular Tomorrow-1000  . sodium chloride  3 mL Intravenous Q12H  . sulfamethoxazole-trimethoprim  1 tablet Oral BID   Continuous Infusions:  PRN Meds:.acetaminophen, acetaminophen, ondansetron (ZOFRAN) IV, ondansetron Assessment/Plan:  1. ESRD on dialysis: Friday 6/20 was last  HD prior to his presentation. He had HD on 6/25 and will likely undergo HD today. AVF surgery this morning by Dr. Donnetta Hutching with minimum blood loss.  - Appreciate  Dr. Abel Presto help in this case. Pt to undergo HD today - Vascular Surgery, Dr. Donnetta Hutching, placed left AVF on 6/27, following  - Appreciate Care Management assistance arranging outpatient HD for him: he has been accepted at [facility] with first appointment on June 30th.   2. Hyperkalemia: Mild at 5.3.on presentation with mild peaked T waves on EKG. K up to 6 on 6/24, improved to 5.1 after Lasix and Kayexalate 30 g once. K of 5.6 this morning.  - Hemodialysis today  3. HIV disease: He has newly diagnosed HIV and states that he knows his CD4 count is "low." He states that he has been taking his OIs including atovaquone, Bactrim, and Azithromycin. He tells me that his ID doctor in [place] instructed him to follow up with an ID physician of his choosing here so they could do genome testing before he started anti-retroviral therapy.  - Repeat CD4 and HIV viral load with reflex to genotype  - He needs [facility] follow up as outpatient.  - ID consulted, appreciate recommendations. Ordered Hep C Ab w/reflex, Hep A Ab, Hep B core Ab, Hep B surface Ab. (Hep B surface antigen negative)  -Discontinued atovaquone, continue Bactrim   4. Normocytic anemia: HgB on admission was 9.4, baseline Hg is unknown. Hg stable this morning at 9.2. Minimum blood loss with AVF surgery today. -Started on Epo and iron supplementation  -CBC in AM  5. HTN: Continue home medications.   6. VTE: Heparin   Dispo: Disposition is deferred at this time, awaiting improvement of current medical problems.  Anticipated discharge in approximately 1-2 day(s).   The patient does not have a current PCP and does not need an [facility] hospital follow-up appointment after discharge. He  will follow up with ID, Nephrology, and Vascular Surgery after his discharge.   The patient does not have  transportation limitations that hinder transportation to clinic appointments.  .Services Needed at time of discharge: Y = Yes, Blank = No PT:   OT:   RN:   Equipment:   Other:     LOS: 4 days   Blain Pais, MD 05/07/2013, 10:35 AM

## 2013-05-07 NOTE — Anesthesia Procedure Notes (Signed)
Procedure Name: LMA Insertion Date/Time: 05/07/2013 7:41 AM Performed by: Vaughan Browner Pre-anesthesia Checklist: Patient identified, Emergency Drugs available, Suction available and Patient being monitored Patient Re-evaluated:Patient Re-evaluated prior to inductionOxygen Delivery Method: Circle system utilized Preoxygenation: Pre-oxygenation with 100% oxygen Intubation Type: IV induction LMA: LMA inserted LMA Size: 4.0 Number of attempts: 1 Placement Confirmation: positive ETCO2 and breath sounds checked- equal and bilateral Tube secured with: Tape Dental Injury: Teeth and Oropharynx as per pre-operative assessment

## 2013-05-07 NOTE — Telephone Encounter (Signed)
Message copied by Berniece Salines on Fri May 07, 2013  1:17 PM ------      Message from: Mena Goes      Created: Fri May 07, 2013 12:39 PM      Regarding: schedule       Please make this appt per Samantha's note.                   ----- Message -----         From: Gabriel Earing, PA-C         Sent: 05/07/2013  12:20 PM           To: Mena Goes, CMA            Not sure why it was a reminder---he is an inpatient and needs to see him in 4 weeks.            Thanks!      Aldona Bar                  ----- Message -----         From: Mena Goes, CMA         Sent: 05/07/2013  10:57 AM           To: Gabriel Earing, PA-C            This came through as a reminder, do I need to send this like the regular discharges?             ----- Message -----         From: Gabriel Earing, PA-C         Sent: 05/07/2013   9:37 AM           To: Mena Goes, CMA            F/u with Dr. Donnetta Hutching in 4 weeks.  S/p left radio cephalic AVF [id number]                   ------

## 2013-05-07 NOTE — Consult Note (Cosign Needed)
Date: 05/07/2013               Patient Name:  Dylan Barber MRN: [id number]  DOB: [birth year] Age / Sex: 37 y.o., male   PCP: Placido Sou, MD         Requesting Physician: Dr. Madilyn Fireman, MD    Consulting Reason:  HIV     Chief Complaint: ESRD   History of Present Illness: Dylan Barber is a 37 yo hypertensive male who was diagnosed with end stage renal failure in 11/2012 and was started on dialysis at [facility] in [place]. He actually presented to the hospital with diarrhea, had no urinary symptoms and was diagnosed with ESRD incidentally. He has a family history of renal failure in his mother who died at the age of 46. His maternal uncle has a kidney transplant due to renal failure and his grandfather also died of renal failure. He wanted to move closer to his father and was told that he had been set up for dialysis in [place] but he was refused because he did not have any AV fistula. He came to [facility] on 06/23 for emergency dialysis when he started having SOB, orthopnea and pedal edema. He had his dialysis here on 06/25. He is now getting set up for the AV fistula formation. He reports having diagnosed with HTN  along with ESRD and is stable on Cavedilol and Losaratan.  He was diagnosed with HIV on 02/2013 but was not started on therapy. His viral load is 134339 and CD4 count is 120. He had 2 partners in the last 10 years and one of them reports to be negative and the other partner is getting tested. He denies use of any illicit drugs and IVDA. He was not incarcerated before. He reports having detected with some infection near lungs and started on Azithro and Bactrim but he is not sure about the diagnosis and he did not have any cough, fever or chills.   Meds: Current Facility-Administered Medications  Medication Dose Route Frequency Provider Last Rate Last Dose  . 0.9 % irrigation (POUR BTL)    PRN Rosetta Posner, MD   1,000 mL at 05/07/13 0724  . acetaminophen  (TYLENOL) tablet 650 mg  650 mg Oral Q6H PRN Trish Fountain, MD       Or  . acetaminophen (TYLENOL) suppository 650 mg  650 mg Rectal Q6H PRN Trish Fountain, MD      . aspirin EC tablet 81 mg  81 mg Oral Daily Trish Fountain, MD   81 mg at 05/06/13 1001  . azithromycin (ZITHROMAX) tablet 1,200 mg  1,200 mg Oral Q Jeneen Rinks, MD      . carvedilol (COREG) tablet 6.25 mg  6.25 mg Oral BID WC Trish Fountain, MD   6.25 mg at 05/06/13 1745  . cefUROXime (ZINACEF) 1.5 g in dextrose 5 % 50 mL IVPB  1.5 g Intravenous On Call to Merrill, PA-C      . cefUROXime (ZINACEF) 1.5 G injection           . dextrose 5 % solution           . ferric gluconate (NULECIT) 125 mg in sodium chloride 0.9 % 100 mL IVPB  125 mg Intravenous Q M,W,F-HD Estanislado Emms, MD      . heparin 6,000 Units in sodium chloride irrigation 0.9 % 500 mL irrigation    PRN Rosetta Posner, MD      .  heparin injection 5,000 Units  5,000 Units Subcutaneous Q8H Trish Fountain, MD   5,000 Units at 05/06/13 2138  . lidocaine-EPINEPHrine 0.5 %-1:200000 (with pres) injection    PRN Rosetta Posner, MD   50 mL at 05/07/13 0720  . losartan (COZAAR) tablet 50 mg  50 mg Oral Daily Trish Fountain, MD   50 mg at 05/06/13 1000  . ondansetron (ZOFRAN) tablet 4 mg  4 mg Oral Q6H PRN Trish Fountain, MD       Or  . ondansetron [facility]) injection 4 mg  4 mg Intravenous Q6H PRN Trish Fountain, MD      . pneumococcal 23 valent vaccine (PNU-IMMUNE) injection 0.5 mL  0.5 mL Intramuscular Tomorrow-1000 Blain Pais, MD      . sodium chloride 0.9 % injection 3 mL  3 mL Intravenous Q12H Trish Fountain, MD   3 mL at 05/06/13 2138  . sulfamethoxazole-trimethoprim (BACTRIM DS) 800-160 MG per tablet 1 tablet  1 tablet Oral BID Trish Fountain, MD   1 tablet at 05/06/13 2137   Facility-Administered Medications Ordered in Other Encounters  Medication Dose Route Frequency Provider Last Rate Last Dose   . 0.9 %  sodium chloride infusion    Continuous PRN Vaughan Browner, CRNA        Allergies: Allergies as of 05/03/2013 - Review Complete 05/03/2013  Allergen Reaction Noted  . Codeine  05/03/2013  . Eggs or egg-derived products  05/03/2013  . Mercury  05/03/2013  . Shellfish allergy  05/03/2013   Past Medical History  Diagnosis Date  . Hypertension   . Dialysis patient   . Renal disorder    History reviewed. No pertinent past surgical history. Family History  Problem Relation Age of Onset  . Kidney failure Mother   . Kidney failure Maternal Uncle    History   Social History  . Marital Status: Single    Spouse Name: N/A    Number of Children: N/A  . Years of Education: N/A   Occupational History  . Not on file.   Social History Main Topics  . Smoking status: Never Smoker   . Smokeless tobacco: Not on file  . Alcohol Use: 0.6 oz/week    1 Glasses of wine per week     Comment: 1 glass wine per week  . Drug Use: No  . Sexually Active: Not on file   Other Topics Concern  . Not on file   Social History Narrative   Originally from [place],  States father worked at [employer].  Moved from [place], [place]. On 6/21 to live with father.     Review of Systems: Pertinent items are noted in HPI.  Physical Exam: Blood pressure 139/92, pulse 101, temperature 98.6 F (37 C), temperature source Oral, resp. rate 16, height 5' 7.5" (1.715 m), weight 70.716 kg (155 lb 14.4 oz), SpO2 96.00%. Constitutional:He was in no acute distress and cooperative with exam. Alert and oriented x3.  Head: Normocephalic and atraumatic Ear: TM normal bilaterally Nose: No erythema or drainage noted.  Turbinates normal Mouth: no erythema or exudates, MMM Eyes: PERRL, EOMI, conjunctivae normal, No scleral icterus.  Neck: Supple, Trachea midline normal ROM, No JVD, mass, thyromegaly, or carotid bruit present.  Cardiovascular: RRR, S1 normal, S2 normal, no MRG, pulses symmetric and intact  bilaterally Pulmonary/Chest: normal respiratory effort, CTAB, no wheezes, rales, or rhonchi Abdominal: Soft. Non-tender, non-distended, bowel sounds are normal, no masses, organomegaly, or guarding present.  GU: no CVA tenderness Musculoskeletal: No  joint deformities, erythema, or stiffness, ROM full and no nontender Hematology: no cervical, inginal, or axillary adenopathy.  Neurological: A&O x3, Strength is normal and symmetric bilaterally, cranial nerve II-XII are grossly intact, no focal motor deficit, sensory intact to light touch bilaterally.  Skin: Warm, dry and intact. No rash, cyanosis, or clubbing.  Psychiatric: Normal mood and affect. speech and behavior is normal. Judgment and thought content normal. Cognition and memory are normal.   Lab results: Basic Metabolic Panel:  Recent Labs  05/04/13 1716  05/06/13 0503 05/07/13 0605  NA 139  < > 138 136  K 6.0*  < > 5.1 5.6*  CL 107  < > 104 104  CO2 20  < > 29 26  GLUCOSE 69*  < > 91 63*  BUN 40*  < > 20 26*  CREATININE 12.42*  < > 7.33* 9.70*  CALCIUM 7.7*  < > 7.3* 7.3*  MG 2.5  --   --   --   PHOS 4.9*  < > 3.9 4.6  < > = values in this interval not displayed. Liver Function Tests:  Recent Labs  05/06/13 0503 05/07/13 0605  ALBUMIN 1.6* 1.6*   No results found for this basename: LIPASE, AMYLASE,  in the last 72 hours No results found for this basename: AMMONIA,  in the last 72 hours CBC:  Recent Labs  05/06/13 0503 05/07/13 0610  WBC 2.8* 2.9*  HGB 9.1* 9.2*  HCT 27.8* 28.0*  MCV 95.9 94.6  PLT 186 167   Anemia Panel:  Recent Labs  05/04/13 1716  VITAMINB12 579  FOLATE 16.9  FERRITIN 485*  TIBC 175*  IRON 46  RETICCTPCT 2.0   Coagulation: No results found for this basename: LABPROT, INR,  in the last 72 hours Urine Drug Screen: Drugs of Abuse  No results found for this basename: labopia, cocainscrnur, labbenz, amphetmu, thcu, labbarb    Alcohol Level: No results found for this basename:  ETH,  in the last 72 hours Urinalysis: No results found for this basename: COLORURINE, APPERANCEUR, LABSPEC, PHURINE, GLUCOSEU, HGBUR, BILIRUBINUR, KETONESUR, PROTEINUR, UROBILINOGEN, NITRITE, LEUKOCYTESUR,  in the last 72 hours Misc. Labs:   Imaging results:  No results found.  Other results: EKG: normal EKG, normal sinus rhythm, unchanged from previous tracings.  Assessment, Plan, & Recommendations by Problem: Principal Problem:   ESRD (end stage renal disease) on dialysis Active Problems:   HIV disease   Hyperkalemia   Normocytic anemia   HTN (hypertension)  37 yo male with c/o ESRD and HTN newly diagnosed with HIV but not started on therapy. He is having his AV fistula surgery today. Considering his viral count and CD4 count he possibly got infected a long time ago.  -We would wait for the genotype results to come back before starting him on the therapy for HIV as viral strains in [place] are quite resistant to therapy. -Continue him on Azithromycin and Bactrim.   Signed: Lowella Dandy, Med Student 05/07/2013, 8:05 AM  '

## 2013-05-07 NOTE — Anesthesia Postprocedure Evaluation (Signed)
Anesthesia Post Note  Patient: Dylan Barber  Procedure(s) Performed: Procedure(s) (LRB): ARTERIOVENOUS (AV) FISTULA CREATION- LEFT (Left)  Anesthesia type: General  Patient location: PACU  Post pain: Pain level controlled and Adequate analgesia  Post assessment: Post-op Vital signs reviewed, Patient's Cardiovascular Status Stable, Respiratory Function Stable, Patent Airway and Pain level controlled  Last Vitals:  Filed Vitals:   05/07/13 0904  BP: 127/84  Pulse: 87  Temp:   Resp: 13    Post vital signs: Reviewed and stable  Level of consciousness: awake, alert  and oriented  Complications: No apparent anesthesia complications

## 2013-05-07 NOTE — Op Note (Signed)
OPERATIVE REPORT  DATE OF SURGERY: 05/07/2013  PATIENT: Dylan Barber, 37 y.o. male MRN: [id number]  DOB: 07-24-1976  PRE-OPERATIVE DIAGNOSIS: End-stage renal disease  POST-OPERATIVE DIAGNOSIS:  Same  PROCEDURE: Left Cimino AV fistula  SURGEON:  Curt Jews, M.D.  ASSISTANT: Nurse  ANESTHESIA:  Gen.  EBL: Minimal ml  Total I/O In: 400 [I.V.:400] Out: -   BLOOD ADMINISTERED: None  DRAINS: None  SPECIMEN: None  COUNTS CORRECT:  YES  PLAN OF CARE: PACU   PATIENT DISPOSITION:  PACU - hemodynamically stable  PROCEDURE DETAILS: The patient was taken to the operating placed supine position where the area of the left arm was prepped and draped in the usual sterile fashion. The patient had a visible cephalic vein at the wrist. This was imaged with ultrasound and was patent throughout the forearm. An incision was made between the level of the cephalic vein and the radial artery. The cephalic vein was mobilized proximal and distally and tributary branches were ligated with 301 4-0 silk ties. The vein was ligated distally and divided. The vein was mobilized to the level of the radial artery. The radial artery was small to moderate size with no atherosclerotic plaque. The artery was occluded proximally and distally with Serafin clamps. The artery was opened with an 11 blade and extended longitudinally with Potts scissors. The vein was cut to the appropriate length and was spatulated and sewn end-to-side to the artery with a running 6-0 Prolene suture. Clamps removed and good flow was noted in the vein. The wounds were irrigated with saline. Hemostasis electrocautery. Wounds were closed with 3-0 Vicryl subcutaneous and subcuticular tissue. Benzoin Steri-Strips were applied. The patient was taken to the recovery room in stable condition.   Curt Jews, M.D. 05/07/2013 8:58 AM

## 2013-05-07 NOTE — Procedures (Signed)
S/P AV access. Left Cimino AV Fistula.  Hemodynamically stable.  HD arranged at [facility]. Christle Nolting C

## 2013-05-07 NOTE — Anesthesia Preprocedure Evaluation (Addendum)
Anesthesia Evaluation  Patient identified by MRN, date of birth, ID band Patient awake    Reviewed: Allergy & Precautions, H&P , NPO status , Patient's Chart, lab work & pertinent test results  Airway Mallampati: II  Neck ROM: full    Dental  (+) Dental Advisory Given   Pulmonary          Cardiovascular hypertension, Pt. on medications and Pt. on home beta blockers     Neuro/Psych    GI/Hepatic   Endo/Other    Renal/GU ESRF and DialysisRenal disease     Musculoskeletal   Abdominal   Peds  Hematology  (+) HIV,   Anesthesia Other Findings   Reproductive/Obstetrics                          Anesthesia Physical Anesthesia Plan  ASA: III  Anesthesia Plan: MAC   Post-op Pain Management:    Induction: Intravenous  Airway Management Planned: Simple Face Mask  Additional Equipment:   Intra-op Plan:   Post-operative Plan:   Informed Consent: I have reviewed the patients History and Physical, chart, labs and discussed the procedure including the risks, benefits and alternatives for the proposed anesthesia with the patient or authorized representative who has indicated his/her understanding and acceptance.     Plan Discussed with: CRNA, Anesthesiologist and Surgeon  Anesthesia Plan Comments:         Anesthesia Quick Evaluation

## 2013-05-08 LAB — RENAL FUNCTION PANEL
BUN: 13 mg/dL (ref 6–23)
CO2: 29 mEq/L (ref 19–32)
Calcium: 7.2 mg/dL — ABNORMAL LOW (ref 8.4–10.5)
Glucose, Bld: 63 mg/dL — ABNORMAL LOW (ref 70–99)
Phosphorus: 4.3 mg/dL (ref 2.3–4.6)
Potassium: 4.6 mEq/L (ref 3.5–5.1)

## 2013-05-08 LAB — CBC
HCT: 27.9 % — ABNORMAL LOW (ref 39.0–52.0)
Hemoglobin: 9.1 g/dL — ABNORMAL LOW (ref 13.0–17.0)
MCH: 31 pg (ref 26.0–34.0)
MCHC: 32.6 g/dL (ref 30.0–36.0)
RBC: 2.94 MIL/uL — ABNORMAL LOW (ref 4.22–5.81)

## 2013-05-08 NOTE — Progress Notes (Signed)
[facility]  Subjective  - POD #1  S/p left radiocephalic AVF No pain in hand, only over AVF   Physical Exam:  Good thrill       Assessment/Plan:  POD #1  To f/u with Dr. Donnetta Hutching in July  Jennise Both IV, [place] 05/08/2013 6:49 AM --  Filed Vitals:   05/08/13 0513  BP: 126/78  Pulse: 90  Temp: 98.4 F (36.9 C)  Resp: 18    Intake/Output Summary (Last 24 hours) at 05/08/13 0649 Last data filed at 05/07/13 1800  Gross per 24 hour  Intake 1319.25 ml  Output   3903 ml  Net -2583.75 ml     Laboratory CBC    Component Value Date/Time   WBC 2.9* 05/07/2013 0610   HGB 9.2* 05/07/2013 0610   HCT 28.0* 05/07/2013 0610   PLT 167 05/07/2013 0610    BMET    Component Value Date/Time   NA 136 05/07/2013 0605   K 5.6* 05/07/2013 0605   CL 104 05/07/2013 0605   CO2 26 05/07/2013 0605   GLUCOSE 63* 05/07/2013 0605   BUN 26* 05/07/2013 0605   CREATININE 9.70* 05/07/2013 0605   CALCIUM 7.3* 05/07/2013 0605   GFRNONAA 6* 05/07/2013 0605   GFRAA 7* 05/07/2013 0605    COAG No results found for this basename: INR, PROTIME   No results found for this basename: PTT    Antibiotics Anti-infectives   Start     Dose/Rate Route Frequency Ordered Stop   05/07/13 1000  azithromycin (ZITHROMAX) tablet 1,200 mg     1,200 mg Oral Every Fri 05/04/13 1545     05/07/13 0713  cefUROXime (ZINACEF) 1.5 G injection    Comments:  CHASE, TONJA: cabinet override      05/07/13 0713 05/07/13 0715   05/07/13 0600  cefUROXime (ZINACEF) 1.5 g in dextrose 5 % 50 mL IVPB     1.5 g 100 mL/hr over 30 Minutes Intravenous On call to O.R. 05/05/13 1346 05/07/13 0743   05/06/13 0600  cefUROXime (ZINACEF) 1.5 g in dextrose 5 % 50 mL IVPB  Status:  Discontinued     1.5 g 100 mL/hr over 30 Minutes Intravenous On call to O.R. 05/05/13 1334 05/05/13 1346   05/04/13 2200  sulfamethoxazole-trimethoprim (BACTRIM DS) 800-160 MG per tablet 1 tablet     1 tablet Oral 2 times daily  05/04/13 1545     05/04/13 1700  atovaquone (MEPRON) 750 MG/5ML suspension 1,500 mg  Status:  Discontinued     1,500 mg Oral Daily 05/04/13 1545 05/06/13 1436       V. Leia Alf, M.D. [facility] Office: [phone] Pager:  [phone]

## 2013-05-08 NOTE — Progress Notes (Signed)
Stable after 2 hemodialysis treatments.  BP improved.  Less SOB. No edema. AVF l wrist.  Will see in f/u at [facility] next week. Gergory Biello C

## 2013-05-08 NOTE — Discharge Summary (Signed)
Name: Dylan Barber MRN: [id number] DOB: 14-Nov-1975 37 y.o. PCP: Dylan Sou, MD  Date of Admission: 05/03/2013 10:44 PM Date of Discharge: 05/08/2013 Attending Physician: Dylan Fireman, MD  Discharge Diagnosis: Principal Problem:   ESRD (end stage renal disease) on dialysis Active Problems:   HIV disease   Hyperkalemia   Normocytic anemia   HTN (hypertension)  Discharge Medications:   Medication List    STOP taking these medications       atovaquone 750 MG/5ML suspension  Commonly known as:  MEPRON      TAKE these medications       azithromycin 600 MG tablet  Commonly known as:  ZITHROMAX  Take 1,200 mg by mouth every 7 (seven) days. Fridays     carvedilol 6.25 MG tablet  Commonly known as:  COREG  Take 6.25 mg by mouth 2 (two) times daily with a meal.     losartan 50 MG tablet  Commonly known as:  COZAAR  Take 50 mg by mouth daily.     sulfamethoxazole-trimethoprim 800-160 MG per tablet  Commonly known as:  BACTRIM DS  Take 1 tablet by mouth 2 (two) times daily. For 14 days; Start date 04/29/13        Disposition and follow-up:   DylanDylan Barber was discharged from [facility] in Good condition.  At the hospital follow up visit please address:  1.  His volume status, follow up on his HIV genotype.   2.  Labs / imaging needed at time of follow-up: As indicated per Nephrology and ID  3.  Pending labs/ test needing follow-up: HIV genotype  Follow-up Appointments: Follow-up Information   Follow up with EARLY, TODD, MD In 4 weeks. (Office will call you to arrange your appt (sent))    Contact information:   [address] [place] [address] [phone]       Schedule an appointment as soon as possible for a visit with [place]             . (1-2 weeks)    Contact information:   [address] [place] [address]       Discharge Instructions:  Future Appointments  Provider Department Dept Phone   06/01/2013 2:00 PM [facility] [phone]   06/01/2013 3:45 PM Dylan Posner, MD [facility] [phone]      Consultations: Treatment Team:  Dylan Emms, MD Dylan Posner, MD  Procedures Performed:  X-ray Chest Pa And Lateral   05/05/2013   *RADIOLOGY REPORT*  Clinical Data: Shortness of breath, hypertension, dialysis patient  CHEST - 2 Barber  Comparison: 05/04/2013  Findings: Left side dialysis catheter tip projects over SVC. Enlargement of cardiac silhouette. Mediastinal contours and pulmonary vascularity normal. Mild chronic peribronchial thickening. No acute infiltrate, pleural effusion or pneumothorax. Bones unremarkable.  IMPRESSION: Enlargement of cardiac silhouette. Mild chronic bronchitic changes.   Original Report Authenticated By: Dylan Barber, M.D.   Dg Chest 2 Barber  05/04/2013   *RADIOLOGY REPORT*  Clinical Data: Shortness of breath, dialysis patient.  CHEST - 2 Barber  Comparison: None.  Findings: Left dialysis catheter is in place with the tip at the cavoatrial junction.  Mild cardiomegaly.  Mild peribronchial thickening.  No confluent airspace opacities, effusions or edema. No acute bony abnormality.  IMPRESSION: Cardiomegaly.  Mild peribronchial thickening.   Original Report Authenticated By: Dylan Barber, M.D.  Admission HPI:  Dylan Barber is a 37 year old man with a PMH of end stage renal disease initially diagnosed in 11/2012 at [facility], dialysis started in February at the same, and newly discovered HIV infection that was initially diagnosed in April of 2014 who presents to the [facility] ED for dialysis. He states that he was last dialyzed in [place], [place]. On Friday and was told by his social worker there to present to the ED at [facility] to get placed in a dialysis center here in [place]. He states that he moved here Saturday to be closer to his father who lives in  [place]. He has a strong family history of ESRD with kidney failure in his mother and uncle. He has a tunneled catheter placed in his left upper chest that was placed 2 weeks ago per his report. He states that he is trying to get set up with Dylan Barber and was told this was what he had to do to get this set up. Currently he states that he is concerned about his lower extremity swelling and shortness of breath. He states that he gets fatigued walking from the ED room to the bathroom less then 100 ft away. He also has a hard time laying down to sleep. He denies cough, or palpitations.  He also notes that he was diagnosed with HIV in April of 2014. He does not remember his CD 4 count or his viral load but states that "I know it was low." He has not started ART therapy but states that he has been taking his Atovaquone, Azithromycin, and Bactrim as prescribed by his doctor in [place], [place]. He currently denies any cough, fevers, chills, nausea, vomiting, chest pain, or abdominal pain   Hospital Course by problem list:  1. ESRD on dialysis: Friday 6/20 was his last HD prior to his presentation. He had HD on 6/25 and on 6/27 during this hospitalization with AVF surgery on 6/27 by Dr. Donnetta Barber with minimum blood loss. We apppreciate Dylan Barber help in helping this patient with outpatient HD placement. Dylan Barber has been accepted at the [facility] with his first outpatient HD appointment on June 30th. He will follow up with Vascular Surgery next month is regards to his AV fistula.    2. Hyperkalemia: Mild with K at 5.3.on presentation with mild peaked T waves on EKG.  His increased to 6 on 6/24, improved to 5.1 after Lasix and Kayexalate 30 g once. His K improved to 5.6 on 6/27 and was 4.6 on the day of his discharge. He will have hemodialysis in outpatient center on Monday, 6/30.   3. HIV disease: He has newly diagnosed HIV and states that he knows his CD4 count is "low." He had been taking his OIs  including atovaquone, Bactrim, and Azithromycin. [place] doctor in [place] instructed him to follow up with an ID physician of his choosing here so they could do genome testing before he started anti-retroviral therapy. CD4 count of 120, viral load of 134339. Inpatient ID consulted during this hospitalization with recommendations to discontinue Atovaquone and follow up on infectious disease labs: Hep B Ab reactive, Hep A Ab negative, Hep C Ab negative.   Genotype pending, he will follow up with the [facility] follow up as outpatient for initiation of ARVs therapy once genotype is available (may take up to two weeks). We will continue his Azithromycin and Bactrim upon his discharge.   4. Normocytic anemia: HgB on admission was 9.4,  baseline Hg is unknown. Hg stable the morning of his discharge at  9.2. Minimum blood loss with AVF surgery on 6/27. He was started on Epo and iron supplementation per Nephrology and will follow up with them as outpatient.   5. HTN: His blood pressure was mildly elevated on presentation and remained stable after he underwent hemodialysis. We continued his home Cozaar 50mg  daily and Coreg 6.25 mg BID.   Discharge Vitals:   BP 125/76  Pulse 91  Temp(Src) 98.4 F (36.9 C) (Oral)  Resp 18  Ht 5' 7.5" (1.715 m)  Wt 145 lb 11.6 oz (66.1 kg)  BMI 22.47 kg/m2  SpO2 99%  Discharge Labs:  Results for orders placed during the hospital encounter of 05/03/13 (from the past 24 hour(s))  CBC     Status: Abnormal   Collection Time    05/08/13  6:18 AM      Result Value Range   WBC 2.8 (*) 4.0 - 10.5 K/uL   RBC 2.94 (*) 4.22 - 5.81 MIL/uL   Hemoglobin 9.1 (*) 13.0 - 17.0 g/dL   HCT 27.9 (*) 39.0 - 52.0 %   MCV 94.9  78.0 - 100.0 fL   MCH 31.0  26.0 - 34.0 pg   MCHC 32.6  30.0 - 36.0 g/dL   RDW 15.3  11.5 - 15.5 %   Platelets 134 (*) 150 - 400 K/uL  RENAL FUNCTION PANEL     Status: Abnormal   Collection Time    05/08/13  6:18 AM      Result Value Range   Sodium 137  135 - 145 mEq/L     Potassium 4.6  3.5 - 5.1 mEq/L   Chloride 103  96 - 112 mEq/L   CO2 29  19 - 32 mEq/L   Glucose, Bld 63 (*) 70 - 99 mg/dL   BUN 13  6 - 23 mg/dL   Creatinine, Ser 6.51 (*) 0.50 - 1.35 mg/dL   Calcium 7.2 (*) 8.4 - 10.5 mg/dL   Phosphorus 4.3  2.3 - 4.6 mg/dL   Albumin 1.5 (*) 3.5 - 5.2 g/dL   GFR calc non Af Amer 10 (*) >90 mL/min   GFR calc Af Amer 11 (*) >90 mL/min    Signed: Blain Pais, MD 05/08/2013, 2:52 PM   Time Spent on Discharge: 35 minutes Services Ordered on Discharge: None Equipment Ordered on Discharge: None

## 2013-05-08 NOTE — Progress Notes (Signed)
Subjective: He feels "fine" this morning with minimum pain from his AVF. He denies shortness of breath, chest pain, or abdominal pain.   Objective: Vital signs in last 24 hours: Filed Vitals:   05/07/13 1838 05/07/13 2022 05/08/13 0513 05/08/13 1058  BP: 120/79 121/82 126/78 125/76  Pulse: 89 86 90 91  Temp: 98.3 F (36.8 C) 97.8 F (36.6 C) 98.4 F (36.9 C) 98.4 F (36.9 C)  TempSrc: Oral Oral Oral Oral  Resp: 20 18 18 18   Height:      Weight:      SpO2: 99% 99% 96% 99%   Weight change: 2 lb 6.2 oz (1.084 kg)  Intake/Output Summary (Last 24 hours) at 05/08/13 1452 Last data filed at 05/07/13 1800  Gross per 24 hour  Intake 869.25 ml  Output   3903 ml  Net -3033.75 ml   Vitals reviewed.  General: Lying in bed, in NAD  HEENT: no scleral icterus  Chest: Tunneled cath in Left upper chest area above with puncture mark, mild edema. Site of HD cath with no surrounding edema or erythema. Left distal forearm, site of AVF covered with bandage that is d/c/i  Cardiac: RRR, no rubs, murmurs or gallops  Pulm: clear to auscultation bilaterally, no wheezes, rales, or rhonchi  Abd: soft, nontender, nondistended, BS present  Ext: warm and well perfused, trace pedal edema bilaterally.  Neuro: alert and oriented X3, cranial nerves II-XII grossly intact, strength and sensation to light touch equal in bilateral upper and lower extremities  Lab Results: Basic Metabolic Panel:  Recent Labs Lab 05/03/13 2230 05/04/13 1716  05/07/13 0605 05/08/13 0618  NA 141 139  < > 136 137  K 5.3* 6.0*  < > 5.6* 4.6  CL 107 107  < > 104 103  CO2 26 20  < > 26 29  GLUCOSE 74 69*  < > 63* 63*  BUN 35* 40*  < > 26* 13  CREATININE 10.88* 12.42*  < > 9.70* 6.51*  CALCIUM 7.7* 7.7*  < > 7.3* 7.2*  MG  --  2.5  --   --   --   PHOS  --  4.9*  < > 4.6 4.3  < > = values in this interval not displayed. Liver Function Tests:  Recent Labs Lab 05/03/13 2230  05/07/13 0605 05/08/13 0618  AST 33  --    --   --   ALT 24  --   --   --   ALKPHOS 87  --   --   --   BILITOT 0.1*  --   --   --   PROT 6.2  --   --   --   ALBUMIN 1.8*  < > 1.6* 1.5*  < > = values in this interval not displayed.  CBC:  Recent Labs Lab 05/03/13 2230  05/07/13 0610 05/08/13 0618  WBC 4.6  < > 2.9* 2.8*  NEUTROABS 2.4  --   --   --   HGB 9.4*  < > 9.2* 9.1*  HCT 28.6*  < > 28.0* 27.9*  MCV 96.0  < > 94.6 94.9  PLT 246  < > 167 134*  < > = values in this interval not displayed. Anemia Panel:  Recent Labs Lab 05/04/13 1716  VITAMINB12 579  FOLATE 16.9  FERRITIN 485*  TIBC 175*  IRON 46  RETICCTPCT 2.0    Micro Results: Recent Results (from the past 240 hour(s))  MRSA PCR SCREENING  Status: None   Collection Time    05/04/13  6:31 PM      Result Value Range Status   MRSA by PCR NEGATIVE  NEGATIVE Final   Comment:            The GeneXpert MRSA Assay (FDA     approved for NASAL specimens     only), is one component of a     comprehensive MRSA colonization     surveillance program. It is not     intended to diagnose MRSA     infection nor to guide or     monitor treatment for     MRSA infections.   Medications: I have reviewed the patient's current medications. Scheduled Meds: . aspirin EC  81 mg Oral Daily  . azithromycin  1,200 mg Oral Q Fri  . carvedilol  6.25 mg Oral BID WC  . ferric gluconate (FERRLECIT/NULECIT) IV  125 mg Intravenous Q M,W,F-HD  . heparin  5,000 Units Subcutaneous Q8H  . losartan  50 mg Oral Daily  . sodium chloride  3 mL Intravenous Q12H  . sulfamethoxazole-trimethoprim  1 tablet Oral BID   Continuous Infusions:  PRN Meds:.acetaminophen, acetaminophen, ondansetron (ZOFRAN) IV, ondansetron, oxyCODONE Assessment/Plan:  1. ESRD on dialysis: Friday 6/20 was last HD prior to his presentation. He had HD on 6/25 and on 6/27.  AVF surgery on 6/7 by Dr. Donnetta Hutching with minimum blood loss.  - Appreciate Dr. Abel Presto help in this case - Vascular Surgery, Dr. Donnetta Hutching  following. Pt to have outpatient follow up - Appreciate Care Management assistance arranging outpatient HD for him: he has been accepted at [facility] with first appointment on June 30th.   2. Hyperkalemia: Mild at 5.3.on presentation with mild peaked T waves on EKG. K up to 6 on 6/24, improved to 5.1 after Lasix and Kayexalate 30 g once. K of 5.6 yesterday, improved to 4.6 today after HD.  - Hemodialysis in outpatient center on Monday, 6/30  3. HIV disease: He has newly diagnosed HIV and states that he knows his CD4 count is "low." He states that he has been taking his OIs including atovaquone, Bactrim, and Azithromycin. He tells me that his ID doctor in [place] instructed him to follow up with an ID physician of his choosing here so they could do genome testing before he started anti-retroviral therapy. CD4 count of 120, viral load of 134339. Genotype pending, he will follow up with the [facility] follow up as outpatient for initiation of ARVs therapy once genotype is available (may take up to two weeks).    - ID consulted, appreciate recommendations. Ordered Hep C Ab w/reflex, Hep A Ab, Hep B core Ab, Hep B surface Ab. (Hep B surface antigen negative) : Hep B Ab reactive, Hep A Ab negative, Hep C Ab negative.   -Discontinued atovaquone, continue Bactrim   4. Normocytic anemia: HgB on admission was 9.4, baseline Hg is unknown. Hg stable this morning at 9.2. Minimum blood loss with AVF surgery on 6/27.   -Started on Epo and iron supplementation  -CBC in AM   5. HTN: Continue home medications.   6. VTE: Heparin    Dispo: Disposition is deferred at this time, awaiting improvement of current medical problems.  Anticipated discharge in approximately today.   The patient does not have a current PCP and does not need an [facility] hospital follow-up appointment after discharge. He will follow up with ID, Nephrology, and Vascular Surgery.  The patient does  not have transportation limitations that hinder  transportation to clinic appointments.  .Services Needed at time of discharge: Y = Yes, Blank = No PT:   OT:   RN:   Equipment:   Other:     LOS: 5 days   Blain Pais, MD 05/08/2013, 2:52 PM

## 2013-05-08 NOTE — Care Management Utilization Note (Signed)
Utilization review completed.  

## 2013-05-10 ENCOUNTER — Encounter (HOSPITAL_COMMUNITY): Payer: Self-pay | Admitting: Vascular Surgery

## 2013-05-11 LAB — HIV-1 GENOTYPR PLUS

## 2013-05-12 NOTE — Discharge Summary (Signed)
Internal Medicine Teaching Service Attending Note Date: 05/12/2013  Patient name: Dylan Barber  Medical record number: [id number]  Date of birth: [birth year]    I evaluated the patient on the day of discharge and discussed the discharge plan with my resident team. I agree with the discharge documentation and disposition.  Please see the resident note and dc summary for details of plan of care.   Thanks Madilyn Fireman 05/12/2013, 10:51 AM

## 2013-05-13 ENCOUNTER — Telehealth: Payer: Self-pay | Admitting: *Deleted

## 2013-05-13 ENCOUNTER — Ambulatory Visit: Payer: Managed Care, Other (non HMO) | Admitting: Internal Medicine

## 2013-05-13 NOTE — Telephone Encounter (Signed)
Left generic message for patient notifying him of today's missed appointment. Landis Gandy, RN

## 2013-05-31 ENCOUNTER — Encounter: Payer: Self-pay | Admitting: Vascular Surgery

## 2013-06-01 ENCOUNTER — Ambulatory Visit: Payer: Managed Care, Other (non HMO) | Admitting: Vascular Surgery

## 2013-06-26 ENCOUNTER — Encounter: Payer: Self-pay | Admitting: Neurology

## 2013-06-26 DIAGNOSIS — I10 Essential (primary) hypertension: Secondary | ICD-10-CM

## 2013-06-26 DIAGNOSIS — N289 Disorder of kidney and ureter, unspecified: Secondary | ICD-10-CM | POA: Insufficient documentation

## 2013-06-29 ENCOUNTER — Encounter: Payer: Managed Care, Other (non HMO) | Admitting: Neurology

## 2013-06-29 NOTE — Progress Notes (Signed)
This encounter was created in error - please disregard.

## 2013-06-29 NOTE — Progress Notes (Deleted)
[facility]  Provider:  Dr Janann Colonel Referring Provider: Deterding, Joyice Faster, MD Primary Care Physician:  Placido Sou, MD  CC:  Seizure workup  HPI:  Dylan Barber is a 37 y.o. male with history of HIV and ESRD on HD  here as a referral from Dr. Jimmy Footman for possible seizure.   Review of Systems: Out of a complete 14 system review, the patient complains of only the following symptoms, and all other reviewed systems are negative. ***  History   Social History  . Marital Status: Single    Spouse Name: N/A    Number of Children: N/A  . Years of Education: N/A   Occupational History  . Not on file.   Social History Main Topics  . Smoking status: Never Smoker   . Smokeless tobacco: Never Used  . Alcohol Use: 0.6 oz/week    1 Glasses of wine per week     Comment: 1 glass wine per week  . Drug Use: No  . Sexual Activity: Not on file   Other Topics Concern  . Not on file   Social History Narrative   Originally from [place],  States father worked at [employer].  Moved from [place], [place]. On 6/21 to live with father.     Family History  Problem Relation Age of Onset  . Kidney failure Mother   . Kidney failure Maternal Uncle     Past Medical History  Diagnosis Date  . Hypertension   . Dialysis patient   . Renal disorder   . HIV disease   . Anemia   . Seizure     Past Surgical History  Procedure Laterality Date  . Av fistula placement Left 05/07/2013    Procedure: ARTERIOVENOUS (AV) FISTULA CREATION- LEFT;  Surgeon: Rosetta Posner, MD;  Location: [facility] OR;  Service: Vascular;  Laterality: Left;    Current Outpatient Prescriptions  Medication Sig Dispense Refill  . acetaminophen (TYLENOL) 650 MG CR tablet Take 650 mg by mouth every 8 (eight) hours as needed for pain.      Marland Kitchen atovaquone (MEPRON) 750 MG/5ML suspension Take 750 mg by mouth daily.      Marland Kitchen azithromycin (ZITHROMAX) 600 MG tablet Take 1,200 mg by mouth every 7 (seven) days. Fridays       . carvedilol (COREG) 6.25 MG tablet Take 6.25 mg by mouth 2 (two) times daily with a meal.      . diphenhydrAMINE (BENADRYL) 25 mg capsule Take 25 mg by mouth every 6 (six) hours as needed for itching.      Marland Kitchen Doxercalciferol (HECTOROL IV) Inject into the vein as directed.      Marland Kitchen losartan (COZAAR) 50 MG tablet Take 50 mg by mouth daily.      Marland Kitchen sulfamethoxazole-trimethoprim (BACTRIM DS) 800-160 MG per tablet Take 1 tablet by mouth 2 (two) times daily. For 14 days; Start date 04/29/13       No current facility-administered medications for this visit.    Allergies as of 06/29/2013 - Review Complete 06/26/2013  Allergen Reaction Noted  . Codeine  05/03/2013  . Eggs or egg-derived products  05/03/2013  . Mercury  05/03/2013  . Shellfish allergy  05/03/2013    Vitals: There were no vitals taken for this visit. Last Weight:  Wt Readings from Last 1 Encounters:  05/07/13 145 lb 11.6 oz (66.1 kg)   Last Height:   Ht Readings from Last 1 Encounters:  05/04/13 5' 7.5" (1.715 m)  Physical exam: Exam: Gen: NAD, conversant Eyes: anicteric sclerae, moist conjunctivae HENT: [place] Neck: Trachea midline; supple,  Lungs: CTA, no wheezing, rales, rhonic                          CV: RRR, no MRG Abdomen: Soft, non-tender;  Extremities: No peripheral edema  Skin: Normal temperature, no rash,  Psych: Appropriate affect, pleasant  Neuro: MS: AA&Ox3, appropriately interactive, normal affect   Attention: WORLD backwards  Speech: fluent w/o paraphasic error  Memory: good recent and remote recall  CN: PERRL, EOMI no nystagmus, no ptosis, sensation intact to LT V1-V3 bilat, face symmetric, no weakness, hearing grossly intact, palate elevates symmetrically, shoulder shrug 5/5 bilat,  tongue protrudes midline, no fasiculations noted.  Motor: normal bulk and tone Strength: 5/5  In all extremities  Coord: rapid alternating and point-to-point (FNF, HTS) movements intact.  Reflexes:  symmetrical, bilat downgoing toes  Sens: LT intact in all extremities  Gait: posture, stance, stride and arm-swing normal. Tandem gait intact. Able to walk on heels and toes. Romberg absent.   Assessment:  After physical and neurologic examination, review of laboratory studies, imaging, neurophysiology testing and pre-existing records, assessment will be reviewed on the problem list.  Plan:  Treatment plan and additional workup will be reviewed under Problem List.    This encounter was created in error - please disregard.

## 2013-07-08 ENCOUNTER — Ambulatory Visit: Payer: Managed Care, Other (non HMO)

## 2013-07-08 DIAGNOSIS — B20 Human immunodeficiency virus [HIV] disease: Secondary | ICD-10-CM

## 2013-07-08 LAB — CBC WITH DIFFERENTIAL/PLATELET
Basophils Absolute: 0 10*3/uL (ref 0.0–0.1)
Eosinophils Relative: 22 % — ABNORMAL HIGH (ref 0–5)
Lymphocytes Relative: 20 % (ref 12–46)
Lymphs Abs: 0.7 10*3/uL (ref 0.7–4.0)
MCV: 88.6 fL (ref 78.0–100.0)
Neutro Abs: 1.4 10*3/uL — ABNORMAL LOW (ref 1.7–7.7)
Platelets: 119 10*3/uL — ABNORMAL LOW (ref 150–400)
RBC: 3.52 MIL/uL — ABNORMAL LOW (ref 4.22–5.81)
RDW: 14.6 % (ref 11.5–15.5)
WBC: 3.2 10*3/uL — ABNORMAL LOW (ref 4.0–10.5)

## 2013-07-08 LAB — LIPID PANEL
LDL Cholesterol: 50 mg/dL (ref 0–99)
Total CHOL/HDL Ratio: 3.7 Ratio
VLDL: 46 mg/dL — ABNORMAL HIGH (ref 0–40)

## 2013-07-08 LAB — COMPLETE METABOLIC PANEL WITH GFR
ALT: 16 U/L (ref 0–53)
AST: 24 U/L (ref 0–37)
Albumin: 3.4 g/dL — ABNORMAL LOW (ref 3.5–5.2)
CO2: 32 mEq/L (ref 19–32)
Calcium: 8.4 mg/dL (ref 8.4–10.5)
Chloride: 99 mEq/L (ref 96–112)
Creat: 8.31 mg/dL — ABNORMAL HIGH (ref 0.50–1.35)
GFR, Est African American: 8 mL/min — ABNORMAL LOW
Potassium: 4.3 mEq/L (ref 3.5–5.3)
Sodium: 138 mEq/L (ref 135–145)
Total Protein: 7.6 g/dL (ref 6.0–8.3)

## 2013-07-09 LAB — HIV-1 RNA QUANT-NO REFLEX-BLD
HIV 1 RNA Quant: 179956 copies/mL — ABNORMAL HIGH (ref <20)
HIV-1 RNA Quant, Log: 5.26 {Log} — ABNORMAL HIGH (ref <1.30)

## 2013-07-09 LAB — T-HELPER CELL (CD4) - (RCID CLINIC ONLY): CD4 T Cell Abs: 30 /uL — ABNORMAL LOW (ref 400–2700)

## 2013-07-14 NOTE — Progress Notes (Signed)
Patient was diagnosed in [place] in January, 2014 and never received treatment. He started dialysis in June, 2014 and goes on  Monday, Wednesday and Friday. His male partner is a known positive for HIV and he states condoms were used each time during their four year relationship.  They continue to use condoms for intercourse.  No medical records received.  Laverle Patter, RN

## 2013-07-15 ENCOUNTER — Ambulatory Visit (INDEPENDENT_AMBULATORY_CARE_PROVIDER_SITE_OTHER): Payer: Managed Care, Other (non HMO) | Admitting: Internal Medicine

## 2013-07-15 ENCOUNTER — Encounter: Payer: Self-pay | Admitting: Internal Medicine

## 2013-07-15 VITALS — BP 126/81 | HR 125 | Temp 98.4°F | Ht 67.0 in | Wt 146.0 lb

## 2013-07-15 DIAGNOSIS — B20 Human immunodeficiency virus [HIV] disease: Secondary | ICD-10-CM

## 2013-07-15 DIAGNOSIS — I10 Essential (primary) hypertension: Secondary | ICD-10-CM

## 2013-07-15 DIAGNOSIS — Z23 Encounter for immunization: Secondary | ICD-10-CM

## 2013-07-15 MED ORDER — ABACAVIR SULFATE 300 MG PO TABS: 600.0000 mg | ORAL_TABLET | Freq: Every day | ORAL | Status: DC

## 2013-07-15 MED ORDER — AZITHROMYCIN 600 MG PO TABS: 1200.0000 mg | ORAL_TABLET | ORAL | Status: DC

## 2013-07-15 MED ORDER — LAMIVUDINE 100 MG PO TABS: 50.0000 mg | ORAL_TABLET | Freq: Every day | ORAL | Status: DC

## 2013-07-15 MED ORDER — DARUNAVIR ETHANOLATE 800 MG PO TABS: 800.0000 mg | ORAL_TABLET | Freq: Every day | ORAL | Status: DC

## 2013-07-15 MED ORDER — LAMIVUDINE 5 MG/ML PO SOLN: 25.0000 mg | Freq: Every day | ORAL | Status: DC

## 2013-07-15 MED ORDER — RITONAVIR 100 MG PO TABS: 100.0000 mg | ORAL_TABLET | Freq: Every day | ORAL | Status: DC

## 2013-07-15 MED ORDER — SULFAMETHOXAZOLE-TMP DS 800-160 MG PO TABS: 1.0000 | ORAL_TABLET | Freq: Every day | ORAL | Status: DC

## 2013-07-15 NOTE — Assessment & Plan Note (Addendum)
I will start him on lamivudine 50 mg a day with a load of 150 mg, Prezista with Norvir and abacavir. I will check HLA test today but will go ahead and start him since he has such a low CD4 count.  We'll then check his labs in 4 weeks. Co-pay cards were given.  60 minutes was spent with patient including 35 minutes of face to face contact for counseling and exam.

## 2013-07-15 NOTE — Progress Notes (Signed)
  Subjective:    Patient ID: Dylan Barber, male    DOB: 07/08/1976, 37 y.o.   MRN: [id number]  HPI He comes in as a new patient. He is newly diagnosed HIV earlier this year while in [place]. He has a low CD4 count at the time and also was noted to be in renal failure and has been on dialysis since. He previously did not know of any medical problems. He has not yet been on antiretroviral therapy. He moved back here which is home for him. He is seeing [facility] here in [place].  He is to dialysis Monday Wednesday and Friday. He has done Internet research regarding HIV and his only question is in regards to being able to bring his CD4 count up. He is interested in therapy.   Review of Systems  Constitutional: Negative for fever, activity change, appetite change, fatigue and unexpected weight change.  HENT: Negative for sore throat and trouble swallowing.   Eyes: Negative for visual disturbance.  Respiratory: Negative for shortness of breath and stridor.   Cardiovascular: Negative for chest pain and leg swelling.  Gastrointestinal: Negative for nausea and diarrhea.  Musculoskeletal: Negative for myalgias and arthralgias.  Skin: Negative for rash.  Neurological: Negative for dizziness, light-headedness and headaches.  Hematological: Negative for adenopathy.  Psychiatric/Behavioral: Negative for dysphoric mood.       Objective:   Physical Exam  Constitutional: He is oriented to person, place, and time. He appears well-developed and well-nourished. No distress.  Eyes: Right eye exhibits no discharge. Left eye exhibits no discharge. No scleral icterus.  Cardiovascular: Normal rate, regular rhythm and normal heart sounds.   No murmur heard. Pulmonary/Chest: Effort normal and breath sounds normal. No respiratory distress.  Lymphadenopathy:    He has no cervical adenopathy.  Neurological: He is alert and oriented to person, place, and time.  Skin: Skin is warm and dry. No rash  noted.  Psychiatric: He has a normal mood and affect. His behavior is normal.          Assessment & Plan:

## 2013-07-15 NOTE — Assessment & Plan Note (Signed)
I will start him on lamivudine 50 mg a day with a load of 150 mg, Prezista with Norvir and abacavir. I will check HLA test today but will go ahead and start him since he has such a low CD4 count.  We'll then check his labs in 4 weeks. Co-pay cards were given.

## 2013-07-21 LAB — HLA B*5701: HLA-B*5701: NEGATIVE

## 2013-07-22 ENCOUNTER — Emergency Department (HOSPITAL_COMMUNITY)
Admission: EM | Admit: 2013-07-22 | Discharge: 2013-07-22 | Disposition: A | Discharge status: Home / Self Care | Payer: Managed Care, Other (non HMO) | Attending: Emergency Medicine | Admitting: Emergency Medicine

## 2013-07-22 ENCOUNTER — Encounter (HOSPITAL_COMMUNITY): Payer: Self-pay | Admitting: *Deleted

## 2013-07-22 DIAGNOSIS — Z992 Dependence on renal dialysis: Secondary | ICD-10-CM | POA: Insufficient documentation

## 2013-07-22 DIAGNOSIS — D631 Anemia in chronic kidney disease: Secondary | ICD-10-CM | POA: Insufficient documentation

## 2013-07-22 DIAGNOSIS — Z79899 Other long term (current) drug therapy: Secondary | ICD-10-CM | POA: Insufficient documentation

## 2013-07-22 DIAGNOSIS — K649 Unspecified hemorrhoids: Secondary | ICD-10-CM

## 2013-07-22 DIAGNOSIS — IMO0002 Reserved for concepts with insufficient information to code with codable children: Secondary | ICD-10-CM | POA: Insufficient documentation

## 2013-07-22 DIAGNOSIS — R011 Cardiac murmur, unspecified: Secondary | ICD-10-CM | POA: Insufficient documentation

## 2013-07-22 DIAGNOSIS — Z21 Asymptomatic human immunodeficiency virus [HIV] infection status: Secondary | ICD-10-CM | POA: Insufficient documentation

## 2013-07-22 DIAGNOSIS — D649 Anemia, unspecified: Secondary | ICD-10-CM

## 2013-07-22 DIAGNOSIS — I120 Hypertensive chronic kidney disease with stage 5 chronic kidney disease or end stage renal disease: Secondary | ICD-10-CM | POA: Insufficient documentation

## 2013-07-22 DIAGNOSIS — K648 Other hemorrhoids: Secondary | ICD-10-CM | POA: Insufficient documentation

## 2013-07-22 DIAGNOSIS — N186 End stage renal disease: Secondary | ICD-10-CM

## 2013-07-22 LAB — COMPREHENSIVE METABOLIC PANEL
AST: 36 U/L (ref 0–37)
Albumin: 2.9 g/dL — ABNORMAL LOW (ref 3.5–5.2)
Calcium: 8.7 mg/dL (ref 8.4–10.5)
Creatinine, Ser: 8.56 mg/dL — ABNORMAL HIGH (ref 0.50–1.35)

## 2013-07-22 LAB — CBC
Platelets: 163 10*3/uL (ref 150–400)
RDW: 16.7 % — ABNORMAL HIGH (ref 11.5–15.5)
WBC: 4.4 10*3/uL (ref 4.0–10.5)

## 2013-07-22 MED ORDER — HYDROCORTISONE ACETATE 25 MG RE SUPP: 25.0000 mg | Freq: Two times a day (BID) | RECTAL | Status: DC

## 2013-07-22 MED ORDER — HYDROCODONE-ACETAMINOPHEN 5-325 MG PO TABS: 1.0000 | ORAL_TABLET | ORAL | Status: DC | PRN

## 2013-07-22 NOTE — ED Provider Notes (Signed)
CSN: [id number]     Arrival date & time 07/22/13  1504 History   First MD Initiated Contact with Patient 07/22/13 1637     Chief Complaint  Patient presents with  . Rectal Bleeding   (Consider location/radiation/quality/duration/timing/severity/associated sxs/prior Treatment) HPI Comments: Dylan Barber is a 37 y.o. male  with a hx of hypertension, end-stage renal disease on dialysis, HIV, anemia of chronic disease, seizure presents to the Emergency Department complaining of gradual, intermittent, recurring rectal bleeding. Patient states 2 weeks ago he had external hemorrhoids for which he thought hemorrhoid cream and things improved significantly. He reports 3 days ago he began to see blood in his underwear and with defecation. He reports brown stool with external bright red blood streaks and blood on the toilet paper. He reports pain with defecation and with sitting for long periods of time.  Patient's hemorrhoid cream makes the symptoms some better and defecation makes it worse.  Pt denies fever (patient has his temperature checked every other day dialysis), chills, headache, neck pain, chest pain, shortness of breath, abdominal pain nausea, vomiting, diarrhea, weakness, dizziness, syncope, dysuria, hematuria.     Patient is a 37 y.o. male presenting with hematochezia. The history is provided by the patient and medical records. No language interpreter was used.  Rectal Bleeding Quality:  Bright red Amount:  Moderate Duration:  3 days Timing:  Intermittent Progression:  Unchanged Chronicity:  New Context: defecation, hemorrhoids and rectal pain   Context: not anal fissures, not anal penetration, not constipation, not diarrhea, not foreign body, not rectal injury and not spontaneously   Pain details:    Quality:  Aching   Severity:  Mild   Timing:  Only with defecation Similar prior episodes: yes   Relieved by:  Hemorrhoid cream Worsened by:  Defecation and wiping Ineffective  treatments:  Time Associated symptoms: no abdominal pain, no dizziness, no epistaxis, no fever, no hematemesis, no light-headedness, no loss of consciousness, no recent illness and no vomiting   Risk factors: no anticoagulant use, no hx of colorectal cancer, no hx of colorectal surgery, no hx of IBD, no liver disease, no NSAID use and no steroid use     Past Medical History  Diagnosis Date  . Hypertension   . Dialysis patient   . Renal disorder   . HIV disease   . Anemia   . Seizure    Past Surgical History  Procedure Laterality Date  . Av fistula placement Left 05/07/2013    Procedure: ARTERIOVENOUS (AV) FISTULA CREATION- LEFT;  Surgeon: Rosetta Posner, MD;  Location: [facility] OR;  Service: Vascular;  Laterality: Left;   Family History  Problem Relation Age of Onset  . Kidney failure Mother   . Kidney failure Maternal Uncle    History  Substance Use Topics  . Smoking status: Never Smoker   . Smokeless tobacco: Never Used  . Alcohol Use: 0.5 oz/week    1 drink(s) per week     Comment: 1 glass wine per week    Review of Systems  Constitutional: Negative for fever, diaphoresis, appetite change, fatigue and unexpected weight change.  HENT: Negative for nosebleeds, mouth sores, trouble swallowing, neck pain and neck stiffness.   Respiratory: Negative for cough, chest tightness, shortness of breath, wheezing and stridor.   Cardiovascular: Negative for chest pain and palpitations.  Gastrointestinal: Positive for blood in stool, hematochezia and anal bleeding. Negative for nausea, vomiting, abdominal pain, diarrhea, constipation, abdominal distention, rectal pain and hematemesis.  Genitourinary: Negative  for dysuria, urgency, frequency, hematuria, flank pain and difficulty urinating.  Musculoskeletal: Negative for back pain.  Skin: Negative for pallor and rash.  Allergic/Immunologic: Positive for immunocompromised state.  Neurological: Negative for dizziness, loss of consciousness,  weakness and light-headedness.  Hematological: Negative for adenopathy. Does not bruise/bleed easily.  Psychiatric/Behavioral: Negative for confusion.  All other systems reviewed and are negative.    Allergies  Codeine; Eggs or egg-derived products; and Mercury  Home Medications   Current Outpatient Rx  Name  Route  Sig  Dispense  Refill  . abacavir (ZIAGEN) 300 MG tablet   Oral   Take 2 tablets (600 mg total) by mouth daily.   60 tablet   5   . acetaminophen (TYLENOL) 650 MG CR tablet   Oral   Take 650 mg by mouth every 8 (eight) hours as needed for pain.         Marland Kitchen atovaquone (MEPRON) 750 MG/5ML suspension   Oral   Take 750 mg by mouth at bedtime.          Marland Kitchen azithromycin (ZITHROMAX) 600 MG tablet   Oral   Take 2 tablets (1,200 mg total) by mouth every 7 (seven) days. Fridays   8 tablet   5   . carvedilol (COREG) 6.25 MG tablet   Oral   Take 6.25 mg by mouth 2 (two) times daily with a meal.         . Darunavir Ethanolate (PREZISTA) 800 MG tablet   Oral   Take 1 tablet (800 mg total) by mouth daily.   30 tablet   5   . diphenhydrAMINE (BENADRYL) 25 mg capsule   Oral   Take 25 mg by mouth every 6 (six) hours as needed for itching.         . lamivudine (EPIVIR) 100 MG tablet   Oral   Take 0.5 tablets (50 mg total) by mouth daily.   16 tablet   11     Instead of the solution.  150 mg load once then 50 ...   . ritonavir (NORVIR) 100 MG TABS tablet   Oral   Take 1 tablet (100 mg total) by mouth daily.   30 tablet   5   . sulfamethoxazole-trimethoprim (BACTRIM DS) 800-160 MG per tablet   Oral   Take 1 tablet by mouth 2 (two) times daily. 14 day course of therapy started 07/16/13         . HYDROcodone-acetaminophen (NORCO/VICODIN) 5-325 MG per tablet   Oral   Take 1 tablet by mouth every 4 (four) hours as needed for pain.   7 tablet   0   . hydrocortisone (ANUSOL-HC) 25 MG suppository   Rectal   Place 1 suppository (25 mg total) rectally 2  (two) times daily. For 7 days   14 suppository   0    BP 124/93  Pulse 119  Temp(Src) 98.4 F (36.9 C) (Oral)  Resp 20  Ht 5\' 7"  (1.702 m)  Wt 150 lb (68.04 kg)  BMI 23.49 kg/m2  SpO2 99% Physical Exam  Nursing note and vitals reviewed. Constitutional: He is oriented to person, place, and time. He appears well-developed and well-nourished. No distress.  Awake, alert, nontoxic appearance  HENT:  Head: Normocephalic and atraumatic.  Mouth/Throat: Oropharynx is clear and moist. No oropharyngeal exudate.  Eyes: Conjunctivae are normal. Pupils are equal, round, and reactive to light. No scleral icterus.  Neck: Normal range of motion. Neck supple.  Cardiovascular: Normal rate, regular  rhythm and intact distal pulses.  Exam reveals gallop.   Murmur heard. Pulses:      Radial pulses are 2+ on the right side, and 2+ on the left side.       Dorsalis pedis pulses are 2+ on the right side, and 2+ on the left side.       Posterior tibial pulses are 2+ on the right side, and 2+ on the left side.  Pulmonary/Chest: Effort normal and breath sounds normal. No respiratory distress. He has no wheezes. He has no rales. He exhibits no tenderness.  Abdominal: Soft. Bowel sounds are normal. He exhibits no distension and no mass. There is no tenderness. There is no rebound and no guarding.  Genitourinary: Prostate normal. Rectal exam shows external hemorrhoid (small), internal hemorrhoid and tenderness. Rectal exam shows no fissure, no mass and anal tone normal. Guaiac negative stool. Prostate is not enlarged and not tender.     Musculoskeletal: Normal range of motion. He exhibits no edema and no tenderness.  Lymphadenopathy:    He has no cervical adenopathy.  Neurological: He is alert and oriented to person, place, and time. He exhibits normal muscle tone. Coordination normal.  Speech is clear and goal oriented Moves extremities without ataxia  Skin: Skin is warm and dry. He is not diaphoretic. No  erythema.  Psychiatric: He has a normal mood and affect. His behavior is normal.    ED Course  Procedures (including critical care time) Labs Review Labs Reviewed  CBC - Abnormal; Notable for the following:    RBC 3.73 (*)    Hemoglobin 11.6 (*)    HCT 35.5 (*)    RDW 16.7 (*)    All other components within normal limits  COMPREHENSIVE METABOLIC PANEL - Abnormal; Notable for the following:    Creatinine, Ser 8.56 (*)    Albumin 2.9 (*)    Total Bilirubin 0.2 (*)    GFR calc non Af Amer 7 (*)    GFR calc Af Amer 8 (*)    All other components within normal limits  OCCULT BLOOD, POC DEVICE   Imaging Review No results found.  MDM   1. Hemorrhoids   2. Internal hemorrhoids   3. ESRD (end stage renal disease) on dialysis   4. Anemia    Dylan Barber presents with c/o rectal bleeding and hx of hemorrhoids. Pt's rectal bleeding consistent with same.  He is hemodynamically stable without tachycardia.  Pt with anemia of chronic disease at baseline and no variation from that today.  Pt's hemorrhoids are not thrombosed and there if no evidence of perirectal abscess.  Pt is without enlarged, boggy or tender prostate.  Small palpable mass at the distal portion of the gluteal cleft does not appear to be and abscess.  It is potentially a palpable internal hemorrhoid but I have advised pt to f/u with GI for further evaluation and discussion of hemorrhoid treatment options.    It has been determined that no acute conditions requiring further emergency intervention are present at this time. The patient/guardian have been advised of the diagnosis and plan. We have discussed signs and symptoms that warrant return to the ED, such as changes or worsening in symptoms.   Vital signs are stable at discharge.   BP 124/93  Pulse 119  Temp(Src) 98.4 F (36.9 C) (Oral)  Resp 20  Ht 5\' 7"  (1.702 m)  Wt 150 lb (68.04 kg)  BMI 23.49 kg/m2  SpO2 99%  Patient/guardian has voiced  understanding and  agreed to follow-up with the PCP or specialist.         Abigail Butts, PA-C 07/23/13 [phone]

## 2013-07-22 NOTE — ED Notes (Signed)
Pt states has had rectal bleeding x 3 days, states will have bright red blood in under pants when taking them off, states has rectal pain at times also, pt states couple weeks ago had hemrroids flare up but used cream and they went away.

## 2013-07-27 ENCOUNTER — Encounter (INDEPENDENT_AMBULATORY_CARE_PROVIDER_SITE_OTHER): Payer: Managed Care, Other (non HMO) | Admitting: Vascular Surgery

## 2013-07-27 ENCOUNTER — Encounter: Payer: Self-pay | Admitting: *Deleted

## 2013-07-27 ENCOUNTER — Other Ambulatory Visit: Payer: Self-pay | Admitting: *Deleted

## 2013-07-27 ENCOUNTER — Ambulatory Visit (INDEPENDENT_AMBULATORY_CARE_PROVIDER_SITE_OTHER): Payer: Managed Care, Other (non HMO) | Admitting: Vascular Surgery

## 2013-07-27 ENCOUNTER — Encounter: Payer: Self-pay | Admitting: Vascular Surgery

## 2013-07-27 VITALS — BP 114/84 | HR 119 | Ht 67.0 in | Wt 148.0 lb

## 2013-07-27 DIAGNOSIS — N186 End stage renal disease: Secondary | ICD-10-CM

## 2013-07-27 DIAGNOSIS — Z4931 Encounter for adequacy testing for hemodialysis: Secondary | ICD-10-CM

## 2013-07-27 DIAGNOSIS — N184 Chronic kidney disease, stage 4 (severe): Secondary | ICD-10-CM

## 2013-07-27 NOTE — ED Provider Notes (Signed)
Medical screening examination/treatment/procedure(s) were performed by non-physician practitioner and as supervising physician I was immediately available for consultation/collaboration.  Jasper Riling. Alvino Chapel, MD 07/27/13 [phone]

## 2013-07-27 NOTE — Progress Notes (Signed)
Subjective:     Patient ID: Dylan Barber, male   DOB: Nov 15, 1975, 37 y.o.   MRN: [id number]  HPI this 37 year old male with end-stage renal disease has dialysis Monday Wednesday and Friday. He had a left radial to cephalic AV fistula created by Dr. early in June of 2014. He returns today for followup of his fistula. Has not been utilized. He has a catheter in his right IJ which is used for dialysis. He denies any pain or numbness in the left hand.  Past Medical History  Diagnosis Date  . Hypertension   . Dialysis patient   . Renal disorder   . HIV disease   . Anemia   . Seizure     History  Substance Use Topics  . Smoking status: Never Smoker   . Smokeless tobacco: Never Used  . Alcohol Use: 0.5 oz/week    1 drink(s) per week     Comment: 1 glass wine per week    Family History  Problem Relation Age of Onset  . Kidney failure Mother   . Kidney failure Maternal Uncle     Allergies  Allergen Reactions  . Codeine   . Eggs Or Egg-Derived Products   . Mercury     Current outpatient prescriptions:abacavir (ZIAGEN) 300 MG tablet, Take 2 tablets (600 mg total) by mouth daily., Disp: 60 tablet, Rfl: 5;  atovaquone (MEPRON) 750 MG/5ML suspension, Take 750 mg by mouth at bedtime. , Disp: , Rfl: ;  azithromycin (ZITHROMAX) 600 MG tablet, Take 2 tablets (1,200 mg total) by mouth every 7 (seven) days. Fridays, Disp: 8 tablet, Rfl: 5 carvedilol (COREG) 6.25 MG tablet, Take 6.25 mg by mouth 2 (two) times daily with a meal., Disp: , Rfl: ;  Darunavir Ethanolate (PREZISTA) 800 MG tablet, Take 1 tablet (800 mg total) by mouth daily., Disp: 30 tablet, Rfl: 5;  lamivudine (EPIVIR) 100 MG tablet, Take 0.5 tablets (50 mg total) by mouth daily., Disp: 16 tablet, Rfl: 11;  losartan (COZAAR) 50 MG tablet, Take 50 mg by mouth daily., Disp: , Rfl:  ritonavir (NORVIR) 100 MG TABS tablet, Take 1 tablet (100 mg total) by mouth daily., Disp: 30 tablet, Rfl: 5;  sulfamethoxazole-trimethoprim (BACTRIM DS)  800-160 MG per tablet, Take 1 tablet by mouth 2 (two) times daily. 14 day course of therapy started 07/16/13, Disp: , Rfl: ;  acetaminophen (TYLENOL) 650 MG CR tablet, Take 650 mg by mouth every 8 (eight) hours as needed for pain., Disp: , Rfl:  diphenhydrAMINE (BENADRYL) 25 mg capsule, Take 25 mg by mouth every 6 (six) hours as needed for itching., Disp: , Rfl: ;  HYDROcodone-acetaminophen (NORCO/VICODIN) 5-325 MG per tablet, Take 1 tablet by mouth every 4 (four) hours as needed for pain., Disp: 7 tablet, Rfl: 0;  hydrocortisone (ANUSOL-HC) 25 MG suppository, Place 1 suppository (25 mg total) rectally 2 (two) times daily. For 7 days, Disp: 14 suppository, Rfl: 0  BP 114/84  Pulse 119  Ht 5\' 7"  (1.702 m)  Wt 148 lb (67.132 kg)  BMI 23.17 kg/m2  SpO2 100%  Body mass index is 23.17 kg/(m^2).         Review of Systems     Objective:   Physical Exam BP 114/84  Pulse 119  Ht 5\' 7"  (1.702 m)  Wt 148 lb (67.132 kg)  BMI 23.17 kg/m2  SpO2 100%  General alert and oriented x3 in no apparent distress Lungs no rhonchi or wheezing Left upper extremity with well-healed incision over radial  cephalic fistula proximal to wrist. There is a pulse and palpable thrill at the anastomotic site but this disappears in the more proximal cephalic vein has no pulse or thrill. The cephalic vein is visible through the skin from the mid forearm to the shoulder level. There is 3+ brachial and radial pulse palpable.  Today I ordered duplex scan of the left arm AV fistula. This reveals the fistula to be patent at the arterial anastomosis filling to branches of the main trunk of the fistula is then thrombosed for a significant distance and then re\re opens in the distal to mid forearm.      Assessment:     Occluded main trunk of left radial to cephalic AV fistula but patency of the arterial anastomosis filling to branches at the wrist level Patient has end-stage renal disease dialyzes Monday Wednesday and  Friday    Plan:     We'll schedule patient for #1 ligation of the radial cephalic AV fistula at the wrist which is continuing to feel to branches #2 creation of left brachial to cephalic AV fistula. This will need to be performed on a Tuesday or Thursday we'll schedule this for Tuesday, September 30 by Dr. Doren Custard or Bridgett Larsson

## 2013-07-29 ENCOUNTER — Ambulatory Visit: Payer: Managed Care, Other (non HMO) | Admitting: Vascular Surgery

## 2013-08-05 ENCOUNTER — Encounter (HOSPITAL_COMMUNITY): Payer: Self-pay | Admitting: Pharmacy Technician

## 2013-08-11 ENCOUNTER — Encounter (HOSPITAL_COMMUNITY): Payer: Self-pay | Admitting: *Deleted

## 2013-08-11 MED ORDER — DEXTROSE 5 % IV SOLN
1.5000 g | INTRAVENOUS | Status: AC
  Administered 2013-08-12: 1.5 g via INTRAVENOUS
  Filled 2013-08-11: qty 1.5

## 2013-08-12 ENCOUNTER — Ambulatory Visit (HOSPITAL_COMMUNITY)
Admission: RE | Admit: 2013-08-12 | Discharge: 2013-08-12 | Disposition: A | Discharge status: Home / Self Care | Payer: Managed Care, Other (non HMO) | Source: Ambulatory Visit | Attending: Vascular Surgery | Admitting: Vascular Surgery

## 2013-08-12 ENCOUNTER — Other Ambulatory Visit: Payer: Self-pay | Admitting: *Deleted

## 2013-08-12 ENCOUNTER — Telehealth: Payer: Self-pay | Admitting: Vascular Surgery

## 2013-08-12 ENCOUNTER — Encounter (HOSPITAL_COMMUNITY): Payer: Self-pay | Admitting: Certified Registered Nurse Anesthetist

## 2013-08-12 ENCOUNTER — Encounter (HOSPITAL_COMMUNITY): Payer: Self-pay | Admitting: *Deleted

## 2013-08-12 ENCOUNTER — Encounter (HOSPITAL_COMMUNITY)
Admission: RE | Disposition: A | Discharge status: Home / Self Care | Payer: Self-pay | Source: Ambulatory Visit | Attending: Vascular Surgery

## 2013-08-12 ENCOUNTER — Ambulatory Visit (HOSPITAL_COMMUNITY): Payer: Managed Care, Other (non HMO) | Admitting: Certified Registered Nurse Anesthetist

## 2013-08-12 DIAGNOSIS — I120 Hypertensive chronic kidney disease with stage 5 chronic kidney disease or end stage renal disease: Secondary | ICD-10-CM | POA: Insufficient documentation

## 2013-08-12 DIAGNOSIS — Y832 Surgical operation with anastomosis, bypass or graft as the cause of abnormal reaction of the patient, or of later complication, without mention of misadventure at the time of the procedure: Secondary | ICD-10-CM | POA: Insufficient documentation

## 2013-08-12 DIAGNOSIS — N186 End stage renal disease: Secondary | ICD-10-CM

## 2013-08-12 DIAGNOSIS — Z4931 Encounter for adequacy testing for hemodialysis: Secondary | ICD-10-CM

## 2013-08-12 DIAGNOSIS — T82898A Other specified complication of vascular prosthetic devices, implants and grafts, initial encounter: Secondary | ICD-10-CM | POA: Insufficient documentation

## 2013-08-12 DIAGNOSIS — D649 Anemia, unspecified: Secondary | ICD-10-CM | POA: Insufficient documentation

## 2013-08-12 DIAGNOSIS — Z992 Dependence on renal dialysis: Secondary | ICD-10-CM | POA: Insufficient documentation

## 2013-08-12 DIAGNOSIS — B20 Human immunodeficiency virus [HIV] disease: Secondary | ICD-10-CM | POA: Insufficient documentation

## 2013-08-12 HISTORY — PX: AV FISTULA PLACEMENT: SHX1204

## 2013-08-12 HISTORY — PX: LIGATION OF ARTERIOVENOUS  FISTULA: SHX5948

## 2013-08-12 HISTORY — DX: Cardiac murmur, unspecified: R01.1

## 2013-08-12 LAB — POCT I-STAT 4, (NA,K, GLUC, HGB,HCT)
HCT: 42 % (ref 39.0–52.0)
Hemoglobin: 14.3 g/dL (ref 13.0–17.0)

## 2013-08-12 SURGERY — ARTERIOVENOUS (AV) FISTULA CREATION
Anesthesia: General | Site: Arm Upper | Laterality: Left | Wound class: Clean

## 2013-08-12 MED ORDER — PROPOFOL 10 MG/ML IV BOLUS
INTRAVENOUS | Status: DC | PRN
  Administered 2013-08-12: 200 mg via INTRAVENOUS

## 2013-08-12 MED ORDER — THROMBIN 20000 UNITS EX SOLR
CUTANEOUS | Status: AC
  Filled 2013-08-12: qty 20000

## 2013-08-12 MED ORDER — LIDOCAINE HCL (CARDIAC) 20 MG/ML IV SOLN
INTRAVENOUS | Status: DC | PRN
  Administered 2013-08-12: 100 mg via INTRAVENOUS

## 2013-08-12 MED ORDER — EPHEDRINE SULFATE 50 MG/ML IJ SOLN
INTRAMUSCULAR | Status: DC | PRN
  Administered 2013-08-12: 10 mg via INTRAVENOUS

## 2013-08-12 MED ORDER — CARVEDILOL 3.125 MG PO TABS
ORAL_TABLET | ORAL | Status: AC
  Administered 2013-08-12: 6.25 mg via ORAL
  Filled 2013-08-12: qty 2

## 2013-08-12 MED ORDER — ONDANSETRON HCL 4 MG/2ML IJ SOLN
INTRAMUSCULAR | Status: DC | PRN
  Administered 2013-08-12: 4 mg via INTRAVENOUS

## 2013-08-12 MED ORDER — PROMETHAZINE HCL 25 MG/ML IJ SOLN: 6.2500 mg | INTRAMUSCULAR | Status: DC | PRN

## 2013-08-12 MED ORDER — SODIUM CHLORIDE 0.9 % IR SOLN
Status: DC | PRN
  Administered 2013-08-12: 09:00:00

## 2013-08-12 MED ORDER — 0.9 % SODIUM CHLORIDE (POUR BTL) OPTIME
TOPICAL | Status: DC | PRN
  Administered 2013-08-12: 1000 mL

## 2013-08-12 MED ORDER — PHENYLEPHRINE HCL 10 MG/ML IJ SOLN
10.0000 mg | INTRAVENOUS | Status: DC | PRN
  Administered 2013-08-12: 25 ug/min via INTRAVENOUS

## 2013-08-12 MED ORDER — CARVEDILOL 6.25 MG PO TABS
6.2500 mg | ORAL_TABLET | Freq: Once | ORAL | Status: AC
  Administered 2013-08-12: 6.25 mg via ORAL
  Filled 2013-08-12: qty 1

## 2013-08-12 MED ORDER — HYDROMORPHONE HCL PF 1 MG/ML IJ SOLN
0.2500 mg | INTRAMUSCULAR | Status: DC | PRN
  Administered 2013-08-12 (×2): 0.5 mg via INTRAVENOUS

## 2013-08-12 MED ORDER — OXYCODONE HCL 5 MG/5ML PO SOLN: 5.0000 mg | Freq: Once | ORAL | Status: AC | PRN

## 2013-08-12 MED ORDER — HYDROCODONE-ACETAMINOPHEN 5-325 MG PO TABS: 1.0000 | ORAL_TABLET | ORAL | Status: DC | PRN

## 2013-08-12 MED ORDER — HEPARIN SODIUM (PORCINE) 1000 UNIT/ML IJ SOLN
INTRAMUSCULAR | Status: DC | PRN
  Administered 2013-08-12: 5000 [IU] via INTRAVENOUS

## 2013-08-12 MED ORDER — MIDAZOLAM HCL 5 MG/5ML IJ SOLN
INTRAMUSCULAR | Status: DC | PRN
  Administered 2013-08-12: 2 mg via INTRAVENOUS

## 2013-08-12 MED ORDER — SODIUM CHLORIDE 0.9 % IV SOLN
INTRAVENOUS | Status: DC
  Administered 2013-08-12 (×3): via INTRAVENOUS

## 2013-08-12 MED ORDER — OXYCODONE HCL 5 MG PO TABS
ORAL_TABLET | ORAL | Status: AC
  Filled 2013-08-12: qty 1

## 2013-08-12 MED ORDER — OXYCODONE HCL 5 MG PO TABS
5.0000 mg | ORAL_TABLET | Freq: Once | ORAL | Status: AC | PRN
  Administered 2013-08-12: 5 mg via ORAL

## 2013-08-12 MED ORDER — PROTAMINE SULFATE 10 MG/ML IV SOLN
INTRAVENOUS | Status: DC | PRN
  Administered 2013-08-12: 20 mg via INTRAVENOUS
  Administered 2013-08-12: 10 mg via INTRAVENOUS

## 2013-08-12 MED ORDER — PHENYLEPHRINE HCL 10 MG/ML IJ SOLN
INTRAMUSCULAR | Status: DC | PRN
  Administered 2013-08-12: 120 ug via INTRAVENOUS
  Administered 2013-08-12: 160 ug via INTRAVENOUS
  Administered 2013-08-12 (×4): 120 ug via INTRAVENOUS

## 2013-08-12 MED ORDER — FENTANYL CITRATE 0.05 MG/ML IJ SOLN
INTRAMUSCULAR | Status: DC | PRN
  Administered 2013-08-12: 100 ug via INTRAVENOUS

## 2013-08-12 MED ORDER — HYDROMORPHONE HCL PF 1 MG/ML IJ SOLN
INTRAMUSCULAR | Status: AC
  Filled 2013-08-12: qty 1

## 2013-08-12 SURGICAL SUPPLY — 45 items
ADH SKN CLS APL DERMABOND .7 (GAUZE/BANDAGES/DRESSINGS) ×4
ARMBAND PINK RESTRICT EXTREMIT (MISCELLANEOUS) ×6 IMPLANT
CANISTER SUCTION 2500CC (MISCELLANEOUS) ×3 IMPLANT
CLIP TI MEDIUM 6 (CLIP) ×3 IMPLANT
CLIP TI WIDE RED SMALL 6 (CLIP) ×3 IMPLANT
COVER PROBE W GEL 5X96 (DRAPES) IMPLANT
COVER SURGICAL LIGHT HANDLE (MISCELLANEOUS) ×3 IMPLANT
DECANTER SPIKE VIAL GLASS SM (MISCELLANEOUS) ×3 IMPLANT
DERMABOND ADVANCED (GAUZE/BANDAGES/DRESSINGS) ×2
DERMABOND ADVANCED .7 DNX12 (GAUZE/BANDAGES/DRESSINGS) ×2 IMPLANT
DRAIN PENROSE 1/2X12 LTX STRL (WOUND CARE) IMPLANT
ELECT REM PT RETURN 9FT ADLT (ELECTROSURGICAL) ×3
ELECTRODE REM PT RTRN 9FT ADLT (ELECTROSURGICAL) ×2 IMPLANT
GEL ULTRASOUND 20GR AQUASONIC (MISCELLANEOUS) ×1 IMPLANT
GLOVE BIO SURGEON STRL SZ7.5 (GLOVE) ×3 IMPLANT
GLOVE BIOGEL M 6.5 STRL (GLOVE) ×2 IMPLANT
GLOVE BIOGEL PI IND STRL 6.5 (GLOVE) IMPLANT
GLOVE BIOGEL PI IND STRL 7.0 (GLOVE) IMPLANT
GLOVE BIOGEL PI IND STRL 8 (GLOVE) ×2 IMPLANT
GLOVE BIOGEL PI INDICATOR 6.5 (GLOVE) ×2
GLOVE BIOGEL PI INDICATOR 7.0 (GLOVE) ×1
GLOVE BIOGEL PI INDICATOR 8 (GLOVE) ×1
GOWN BRE IMP PREV XXLGXLNG (GOWN DISPOSABLE) ×1 IMPLANT
GOWN STRL NON-REIN LRG LVL3 (GOWN DISPOSABLE) ×9 IMPLANT
KIT BASIN OR (CUSTOM PROCEDURE TRAY) ×3 IMPLANT
KIT ROOM TURNOVER OR (KITS) ×3 IMPLANT
NDL HYPO 25GX1X1/2 BEV (NEEDLE) ×2 IMPLANT
NEEDLE HYPO 25GX1X1/2 BEV (NEEDLE) ×3 IMPLANT
NS IRRIG 1000ML POUR BTL (IV SOLUTION) ×3 IMPLANT
PACK CV ACCESS (CUSTOM PROCEDURE TRAY) ×3 IMPLANT
PAD ARMBOARD 7.5X6 YLW CONV (MISCELLANEOUS) ×6 IMPLANT
SPONGE GAUZE 4X4 12PLY (GAUZE/BANDAGES/DRESSINGS) ×3 IMPLANT
SPONGE SURGIFOAM ABS GEL 100 (HEMOSTASIS) IMPLANT
SUT ETHILON 3 0 PS 1 (SUTURE) ×1 IMPLANT
SUT PROLENE 6 0 BV (SUTURE) ×3 IMPLANT
SUT SILK 0 TIES 10X30 (SUTURE) ×3 IMPLANT
SUT VIC AB 3-0 SH 27 (SUTURE) ×6
SUT VIC AB 3-0 SH 27X BRD (SUTURE) ×2 IMPLANT
SUT VICRYL 4-0 PS2 18IN ABS (SUTURE) ×4 IMPLANT
SWAB COLLECTION DEVICE MRSA (MISCELLANEOUS) IMPLANT
TOWEL OR 17X24 6PK STRL BLUE (TOWEL DISPOSABLE) ×3 IMPLANT
TOWEL OR 17X26 10 PK STRL BLUE (TOWEL DISPOSABLE) ×3 IMPLANT
TUBE ANAEROBIC SPECIMEN COL (MISCELLANEOUS) IMPLANT
UNDERPAD 30X30 INCONTINENT (UNDERPADS AND DIAPERS) ×3 IMPLANT
WATER STERILE IRR 1000ML POUR (IV SOLUTION) ×3 IMPLANT

## 2013-08-12 NOTE — Telephone Encounter (Signed)
LVM re: appointment information. Sent letter - Dylan Barber

## 2013-08-12 NOTE — Op Note (Signed)
NAME: Dylan Barber   MRN: [id number] DOB: [birth year]    DATE OF OPERATION: 08/12/2013  PREOP DIAGNOSIS: occluded left radiocephalic AV fistula  POSTOP DIAGNOSIS: same  PROCEDURE:  1. Ligation of left radiocephalic AV fistula 2. Creation of new left brachiocephalic AV fistula  SURGEON: Judeth Cornfield. Scot Dock, MD, FACS  ASSIST: Leontine Locket, PA   ANESTHESIA: Gen.   EBL: minimal  INDICATIONS: Dylan Barber is a 37 y.o. male had a left radial cephalic AV fistula placed in June I believe. This occluded but was being maintained through collaterals. He was evaluated in the office and set up for ligation of his fistula in the wrist and placement of a new left brachiocephalic AV fistula  FINDINGS: The brachial artery was approximately 4 mm. The cephalic vein was 3.5 mm  TECHNIQUE: The patient was taken to the operating room and received a general anesthetic. Left upper extremity was prepped and draped in the usual sterile fashion. A small incision was made at the wrist at the superior aspect of the previous incision. Here the cephalic vein was identified dissected free and ligated with a 2-0 silk tie. This incision was closed with a 4-0 subcuticular stitch.  Next attention was turned to creation of the left brachiocephalic AV fistula. The cephalic vein was quite lateral and therefore I elected to make a separate incision over the cephalic vein longitudinally. Here the vein was dissected free and given that the distance to the brachial artery was significant, I had to mobilize the vein fairly far distally into the upper forearm. The vein was ligated distally. 3 separate small longitudinal incision the brachial artery was dissected free beneath the fascia. A tunnel was created between the 2 incisions. The vein was distended up nicely with heparinized saline and then brought to the tunnel after it had been marked to prevent twisting. Patient was heparinized. The brachial artery was clamped  proximally and distally and a longitudinal arteriotomy was made. The vein was sewn end-to-side to the artery using continuous 6-0 Prolene suture. The patient was a good thrill in the fistula. There was a radial and ulnar signal with the Doppler. Hemostasis was obtained in the wounds and the heparin was partially reversed with protamine. Both wounds were closed thrill Vicryl the skin closed with 4-0 Vicryl. Dermabond was applied. The patient tolerated the procedure well and was transferred to the recovery room in stable condition. All needle and sponge counts were correct.  Deitra Mayo, MD, FACS [facility]  DATE OF DICTATION:   08/12/2013

## 2013-08-12 NOTE — Progress Notes (Signed)
Pt's BP decreased to 79/50, pt arousable, oriented X4, Dr. Orene Desanctis notified, no new orders rec'd, will cont to assess for changes

## 2013-08-12 NOTE — Transfer of Care (Signed)
Immediate Anesthesia Transfer of Care Note  Patient: Dylan Barber  Procedure(s) Performed: Procedure(s): ARTERIOVENOUS (AV) FISTULA CREATION BRACHIOCEPHALIC (Left) LIGATION OF ARTERIOVENOUS  FISTULA RADIOCEPHALIC (Left)  Patient Location: PACU  Anesthesia Type:General  Level of Consciousness: awake, alert  and oriented  Airway & Oxygen Therapy: Patient Spontanous Breathing and Patient connected to nasal cannula oxygen  Post-op Assessment: Report given to PACU RN and Post -op Vital signs reviewed and stable  Post vital signs: Reviewed and stable  Complications: No apparent anesthesia complications

## 2013-08-12 NOTE — OR Nursing (Signed)
Pt alert and oriented. VSS.  Waiting on ride.

## 2013-08-12 NOTE — Preoperative (Signed)
Beta Blockers   Reason not to administer Beta Blockers:coreg received this morning prior to surgery

## 2013-08-12 NOTE — Anesthesia Preprocedure Evaluation (Signed)
Anesthesia Evaluation  Patient identified by MRN, date of birth, ID band Patient awake    Reviewed: Allergy & Precautions, H&P , NPO status , Patient's Chart, lab work & pertinent test results  Airway Mallampati: II  Neck ROM: full    Dental  (+) Dental Advisory Given   Pulmonary  breath sounds clear to auscultation        Cardiovascular hypertension, Pt. on medications and Pt. on home beta blockers Rhythm:regular Rate:Normal     Neuro/Psych    GI/Hepatic   Endo/Other    Renal/GU ESRF and Dialysis     Musculoskeletal   Abdominal   Peds  Hematology  (+) HIV,   Anesthesia Other Findings   Reproductive/Obstetrics                           Anesthesia Physical Anesthesia Plan  ASA: III  Anesthesia Plan: General LMA   Post-op Pain Management:    Induction:   Airway Management Planned:   Additional Equipment:   Intra-op Plan:   Post-operative Plan:   Informed Consent:   Plan Discussed with:   Anesthesia Plan Comments:         Anesthesia Quick Evaluation

## 2013-08-12 NOTE — Interval H&P Note (Signed)
History and Physical Interval Note:  08/12/2013 9:11 AM  Dylan Barber  has presented today for surgery, with the diagnosis of ESRD;COMPLICATION WITH AVF  The various methods of treatment have been discussed with the patient and family. After consideration of risks, benefits and other options for treatment, the patient has consented to  Procedure(s): ARTERIOVENOUS (AV) FISTULA CREATION BRACHIOCEPHALIC (Left) LIGATION OF ARTERIOVENOUS  FISTULA RADIOCEPHALIC (Left) as a surgical intervention .  The patient's history has been reviewed, patient examined, no change in status, stable for surgery.  I have reviewed the patient's chart and labs.  Questions were answered to the patient's satisfaction.     Dakai Braithwaite S

## 2013-08-12 NOTE — Anesthesia Postprocedure Evaluation (Signed)
  Anesthesia Post-op Note  Patient: Dylan Barber  Procedure(s) Performed: Procedure(s): ARTERIOVENOUS (AV) FISTULA CREATION BRACHIOCEPHALIC (Left) LIGATION OF ARTERIOVENOUS  FISTULA RADIOCEPHALIC (Left)  Patient Location: PACU  Anesthesia Type:General  Level of Consciousness: awake and alert   Airway and Oxygen Therapy: Patient Spontanous Breathing  Post-op Pain: mild  Post-op Assessment: Post-op Vital signs reviewed  Post-op Vital Signs: stable  Complications: No apparent anesthesia complications

## 2013-08-12 NOTE — Anesthesia Procedure Notes (Signed)
Procedure Name: LMA Insertion Date/Time: 08/12/2013 9:40 AM Performed by: Mariea Clonts Pre-anesthesia Checklist: Patient identified, Patient being monitored, Emergency Drugs available and Suction available Patient Re-evaluated:Patient Re-evaluated prior to inductionOxygen Delivery Method: Circle system utilized Preoxygenation: Pre-oxygenation with 100% oxygen Intubation Type: IV induction Ventilation: Mask ventilation without difficulty LMA: LMA inserted LMA Size: 4.0 Number of attempts: 1 Placement Confirmation: breath sounds checked- equal and bilateral and positive ETCO2 Tube secured with: Tape Dental Injury: Teeth and Oropharynx as per pre-operative assessment

## 2013-08-12 NOTE — Telephone Encounter (Signed)
Message copied by Berniece Salines on Thu Aug 12, 2013 12:56 PM ------      Message from: Alfonso Patten      Created: Thu Aug 12, 2013 11:56 AM      Regarding: FW: charge and f/u                   ----- Message -----         From: Angelia Mould, MD         Sent: 08/12/2013  11:38 AM           To: Patrici Ranks, Alfonso Patten, RN, #      Subject: charge and f/u                                           PROCEDURE:       1. Ligation of left radiocephalic AV fistula      2. Creation of new left brachiocephalic AV fistula            SURGEON: Judeth Cornfield. Scot Dock, MD, FACS            ASSIST: Leontine Locket, PA             He will need a follow up visit in 6 weeks with a duplex of his fistula to check on the maturation of this fistula. Thank you. CD ------

## 2013-08-12 NOTE — H&P (View-Only) (Signed)
Subjective:     Patient ID: Dylan Barber, male   DOB: [birth year], 37 y.o.   MRN: [id number]  HPI this 37 year old male with end-stage renal disease has dialysis Monday Wednesday and Friday. He had a left radial to cephalic AV fistula created by Dr. early in June of 2014. He returns today for followup of his fistula. Has not been utilized. He has a catheter in his right IJ which is used for dialysis. He denies any pain or numbness in the left hand.  Past Medical History  Diagnosis Date  . Hypertension   . Dialysis patient   . Renal disorder   . HIV disease   . Anemia   . Seizure     History  Substance Use Topics  . Smoking status: Never Smoker   . Smokeless tobacco: Never Used  . Alcohol Use: 0.5 oz/week    1 drink(s) per week     Comment: 1 glass wine per week    Family History  Problem Relation Age of Onset  . Kidney failure Mother   . Kidney failure Maternal Uncle     Allergies  Allergen Reactions  . Codeine   . Eggs Or Egg-Derived Products   . Mercury     Current outpatient prescriptions:abacavir (ZIAGEN) 300 MG tablet, Take 2 tablets (600 mg total) by mouth daily., Disp: 60 tablet, Rfl: 5;  atovaquone (MEPRON) 750 MG/5ML suspension, Take 750 mg by mouth at bedtime. , Disp: , Rfl: ;  azithromycin (ZITHROMAX) 600 MG tablet, Take 2 tablets (1,200 mg total) by mouth every 7 (seven) days. Fridays, Disp: 8 tablet, Rfl: 5 carvedilol (COREG) 6.25 MG tablet, Take 6.25 mg by mouth 2 (two) times daily with a meal., Disp: , Rfl: ;  Darunavir Ethanolate (PREZISTA) 800 MG tablet, Take 1 tablet (800 mg total) by mouth daily., Disp: 30 tablet, Rfl: 5;  lamivudine (EPIVIR) 100 MG tablet, Take 0.5 tablets (50 mg total) by mouth daily., Disp: 16 tablet, Rfl: 11;  losartan (COZAAR) 50 MG tablet, Take 50 mg by mouth daily., Disp: , Rfl:  ritonavir (NORVIR) 100 MG TABS tablet, Take 1 tablet (100 mg total) by mouth daily., Disp: 30 tablet, Rfl: 5;  sulfamethoxazole-trimethoprim (BACTRIM DS)  800-160 MG per tablet, Take 1 tablet by mouth 2 (two) times daily. 14 day course of therapy started 07/16/13, Disp: , Rfl: ;  acetaminophen (TYLENOL) 650 MG CR tablet, Take 650 mg by mouth every 8 (eight) hours as needed for pain., Disp: , Rfl:  diphenhydrAMINE (BENADRYL) 25 mg capsule, Take 25 mg by mouth every 6 (six) hours as needed for itching., Disp: , Rfl: ;  HYDROcodone-acetaminophen (NORCO/VICODIN) 5-325 MG per tablet, Take 1 tablet by mouth every 4 (four) hours as needed for pain., Disp: 7 tablet, Rfl: 0;  hydrocortisone (ANUSOL-HC) 25 MG suppository, Place 1 suppository (25 mg total) rectally 2 (two) times daily. For 7 days, Disp: 14 suppository, Rfl: 0  BP 114/84  Pulse 119  Ht 5\' 7"  (1.702 m)  Wt 148 lb (67.132 kg)  BMI 23.17 kg/m2  SpO2 100%  Body mass index is 23.17 kg/(m^2).         Review of Systems     Objective:   Physical Exam BP 114/84  Pulse 119  Ht 5\' 7"  (1.702 m)  Wt 148 lb (67.132 kg)  BMI 23.17 kg/m2  SpO2 100%  General alert and oriented x3 in no apparent distress Lungs no rhonchi or wheezing Left upper extremity with well-healed incision over radial  cephalic fistula proximal to wrist. There is a pulse and palpable thrill at the anastomotic site but this disappears in the more proximal cephalic vein has no pulse or thrill. The cephalic vein is visible through the skin from the mid forearm to the shoulder level. There is 3+ brachial and radial pulse palpable.  Today I ordered duplex scan of the left arm AV fistula. This reveals the fistula to be patent at the arterial anastomosis filling to branches of the main trunk of the fistula is then thrombosed for a significant distance and then re\re opens in the distal to mid forearm.      Assessment:     Occluded main trunk of left radial to cephalic AV fistula but patency of the arterial anastomosis filling to branches at the wrist level Patient has end-stage renal disease dialyzes Monday Wednesday and  Friday    Plan:     We'll schedule patient for #1 ligation of the radial cephalic AV fistula at the wrist which is continuing to feel to branches #2 creation of left brachial to cephalic AV fistula. This will need to be performed on a Tuesday or Thursday we'll schedule this for Tuesday, September 30 by Dr. Doren Custard or Bridgett Larsson

## 2013-08-12 NOTE — Preoperative (Deleted)
Beta Blockers   Reason not to administer Beta Blockers:Hold beta blocker due to hypotension

## 2013-08-13 ENCOUNTER — Encounter (HOSPITAL_COMMUNITY): Payer: Self-pay | Admitting: Vascular Surgery

## 2013-08-26 ENCOUNTER — Ambulatory Visit (INDEPENDENT_AMBULATORY_CARE_PROVIDER_SITE_OTHER): Payer: Managed Care, Other (non HMO) | Admitting: General Surgery

## 2013-08-26 ENCOUNTER — Encounter (INDEPENDENT_AMBULATORY_CARE_PROVIDER_SITE_OTHER): Payer: Self-pay | Admitting: General Surgery

## 2013-08-26 VITALS — BP 92/60 | HR 120 | Temp 99.2°F | Resp 15 | Ht 67.5 in | Wt 145.0 lb

## 2013-08-26 DIAGNOSIS — K644 Residual hemorrhoidal skin tags: Secondary | ICD-10-CM | POA: Insufficient documentation

## 2013-08-26 DIAGNOSIS — K648 Other hemorrhoids: Secondary | ICD-10-CM

## 2013-08-26 MED ORDER — HYDROCORTISONE ACE-PRAMOXINE 1-1 % RE CREA: TOPICAL_CREAM | Freq: Two times a day (BID) | RECTAL | Status: DC

## 2013-08-26 NOTE — Patient Instructions (Signed)
Pick up prescription at pharmacy  Hemorrhoids Hemorrhoids are swollen veins around the rectum or anus. There are two types of hemorrhoids:   Internal hemorrhoids. These occur in the veins just inside the rectum. They may poke through to the outside and become irritated and painful.  External hemorrhoids. These occur in the veins outside the anus and can be felt as a painful swelling or hard lump near the anus. CAUSES  Pregnancy.   Obesity.   Constipation or diarrhea.   Straining to have a bowel movement.   Sitting for long periods on the toilet.  Heavy lifting or other activity that caused you to strain.  Anal intercourse. SYMPTOMS   Pain.   Anal itching or irritation.   Rectal bleeding.   Fecal leakage.   Anal swelling.   One or more lumps around the anus.  DIAGNOSIS  Your caregiver may be able to diagnose hemorrhoids by visual examination. Other examinations or tests that may be performed include:   Examination of the rectal area with a gloved hand (digital rectal exam).   Examination of anal canal using a small tube (scope).   A blood test if you have lost a significant amount of blood.  A test to look inside the colon (sigmoidoscopy or colonoscopy). TREATMENT Most hemorrhoids can be treated at home. However, if symptoms do not seem to be getting better or if you have a lot of rectal bleeding, your caregiver may perform a procedure to help make the hemorrhoids get smaller or remove them completely. Possible treatments include:   Placing a rubber band at the base of the hemorrhoid to cut off the circulation (rubber band ligation).   Injecting a chemical to shrink the hemorrhoid (sclerotherapy).   Using a tool to burn the hemorrhoid (infrared light therapy).   Surgically removing the hemorrhoid (hemorrhoidectomy).   Stapling the hemorrhoid to block blood flow to the tissue (hemorrhoid stapling).  HOME CARE INSTRUCTIONS   Eat foods with  fiber, such as whole grains, beans, nuts, fruits, and vegetables. Ask your doctor about taking products with added fiber in them (fibersupplements).  Increase fluid intake. Drink enough water and fluids to keep your urine clear or pale yellow.   Exercise regularly.   Go to the bathroom when you have the urge to have a bowel movement. Do not wait.   Avoid straining to have bowel movements.   Keep the anal area dry and clean. Use wet toilet paper or moist towelettes after a bowel movement.   Medicated creams and suppositories may be used or applied as directed.   Only take over-the-counter or prescription medicines as directed by your caregiver.   Take warm sitz baths for 15 20 minutes, 3 4 times a day to ease pain and discomfort.   Place ice packs on the hemorrhoids if they are tender and swollen. Using ice packs between sitz baths may be helpful.   Put ice in a plastic bag.   Place a towel between your skin and the bag.   Leave the ice on for 15 20 minutes, 3 4 times a day.   Do not use a donut-shaped pillow or sit on the toilet for long periods. This increases blood pooling and pain.  SEEK MEDICAL CARE IF:  You have increasing pain and swelling that is not controlled by treatment or medicine.  You have uncontrolled bleeding.  You have difficulty or you are unable to have a bowel movement.  You have pain or inflammation outside the area  of the hemorrhoids. MAKE SURE YOU:  Understand these instructions.  Will watch your condition.  Will get help right away if you are not doing well or get worse. Document Released: 10/25/2000 Document Revised: 10/14/2012 Document Reviewed: 09/01/2012 [facility] Patient Information 2014 [place].  GETTING TO GOOD BOWEL HEALTH. Irregular bowel habits such as constipation and diarrhea can lead to many problems over time.  Having one soft bowel movement a day is the most important way to prevent further problems.  The  anorectal canal is designed to handle stretching and feces to safely manage our ability to get rid of solid waste (feces, poop, stool) out of our body.  BUT, hard constipated stools can act like ripping concrete bricks and diarrhea can be a burning fire to this very sensitive area of our body, causing inflamed hemorrhoids, anal fissures, increasing risk is perirectal abscesses, abdominal pain/bloating, an making irritable bowel worse.     The goal: ONE SOFT BOWEL MOVEMENT A DAY!  To have soft, regular bowel movements:    Drink at least 8 tall glasses of water a day.     Take plenty of fiber.  Fiber is the undigested part of plant food that passes into the colon, acting s "natures broom" to encourage bowel motility and movement.  Fiber can absorb and hold large amounts of water. This results in a larger, bulkier stool, which is soft and easier to pass. Work gradually over several weeks up to 6 servings a day of fiber (25g a day even more if needed) in the form of: o Vegetables -- Root (potatoes, carrots, turnips), leafy green (lettuce, salad greens, celery, spinach), or cooked high residue (cabbage, broccoli, etc) o Fruit -- Fresh (unpeeled skin & pulp), Dried (prunes, apricots, cherries, etc ),  or stewed ( applesauce)  o Whole grain breads, pasta, etc (whole wheat)  o Bran cereals    Bulking Agents -- This type of water-retaining fiber generally is easily obtained each day by one of the following:  o Psyllium bran -- The psyllium plant is remarkable because its ground seeds can retain so much water. This product is available as Metamucil, Konsyl, Effersyllium, Per Diem Fiber, or the less expensive generic preparation in drug and health food stores. Although labeled a laxative, it really is not a laxative.  o Methylcellulose -- This is another fiber derived from wood which also retains water. It is available as Citrucel. o Benefiber o Polyethylene Glycol - and "artificial" fiber commonly called Miralax  or Glycolax.  It is helpful for people with gassy or bloated feelings with regular fiber o Flax Seed - a less gassy fiber than psyllium   No reading or other relaxing activity while on the toilet. If bowel movements take longer than 5 minutes, you are too constipated   AVOID CONSTIPATION.  High fiber and water intake usually takes care of this.  Sometimes a laxative is needed to stimulate more frequent bowel movements, but    Laxatives are not a good long-term solution as it can wear the colon out. o Osmotics (Milk of Magnesia, Fleets phosphosoda, Magnesium citrate, MiraLax, GoLytely) are safer than  o Stimulants (Senokot, Castor Oil, Dulcolax, Ex Lax)    o Do not take laxatives for more than 7days in a row.    IF SEVERELY CONSTIPATED, try a Bowel Retraining Program: o Do not use laxatives.  o Eat a diet high in roughage, such as bran cereals and leafy vegetables.  o Drink six (6) ounces of prune  or apricot juice each morning.  o Eat two (2) large servings of stewed fruit each day.  o Take one (1) heaping tablespoon of a psyllium-based bulking agent twice a day. Use sugar-free sweetener when possible to avoid excessive calories.  o Eat a normal breakfast.  o Set aside 15 minutes after breakfast to sit on the toilet, but do not strain to have a bowel movement.  o If you do not have a bowel movement by the third day, use an enema and repeat the above steps.

## 2013-08-27 LAB — AFB CULTURE, BLOOD

## 2013-08-30 NOTE — Progress Notes (Signed)
Patient ID: Dylan Barber, male   DOB: 03/29/1976, 37 y.o.   MRN: [id number]  Chief Complaint  Patient presents with  . New Evaluation    eval hems    HPI Dylan Barber is a 37 y.o. male.   HPI 37 year old [place] American male referred by Dr. Mercy Moore for evaluation of hemorrhoidal problems. The patient is HIV positive as well as on hemodialysis for chronic renal failure. He reports that since he started dialysis in January he has been having intermittent hemorrhoid problems. He denies straining with defecation. He reports 2 bowel movements a day. He reports after dialysis the hemorrhoids generally pop out. He also reports some discomfort with having a bowel movement. He states that the outside burns after having a bowel movement. He denies any sharp intense pain. There will occasionally be a drop of blood on the toilet paper. He denies any unplanned weight loss. He has been using Preparation H wipes. He does not sit on the commode for prolonged period of time.He denies any fecal incontinence. He denies any straining. Past Medical History  Diagnosis Date  . Hypertension   . Dialysis patient   . Renal disorder   . HIV disease   . Anemia   . Seizure   . Heart murmur     Past Surgical History  Procedure Laterality Date  . Av fistula placement Left 05/07/2013    Procedure: ARTERIOVENOUS (AV) FISTULA CREATION- LEFT;  Surgeon: Rosetta Posner, MD;  Location: [facility];  Service: Vascular;  Laterality: Left;  . Insertion of dialysis catheter      x 2  . Av fistula placement Left 08/12/2013    Procedure: ARTERIOVENOUS (AV) FISTULA CREATION BRACHIOCEPHALIC;  Surgeon: Angelia Mould, MD;  Location: [facility];  Service: Vascular;  Laterality: Left;  . Ligation of arteriovenous  fistula Left 08/12/2013    Procedure: LIGATION OF ARTERIOVENOUS  FISTULA RADIOCEPHALIC;  Surgeon: Angelia Mould, MD;  Location: [facility] OR;  Service: Vascular;  Laterality: Left;    Family History  Problem  Relation Age of Onset  . Kidney failure Mother   . Kidney disease Mother   . Kidney failure Maternal Uncle   . Kidney disease Paternal Uncle   . Kidney disease Maternal Grandfather     Social History History  Substance Use Topics  . Smoking status: Never Smoker   . Smokeless tobacco: Never Used  . Alcohol Use: 0.5 oz/week    1 drink(s) per week     Comment: 1 glass wine per week    Allergies  Allergen Reactions  . Codeine   . Eggs Or Egg-Derived Products   . Mercury     Current Outpatient Prescriptions  Medication Sig Dispense Refill  . abacavir (ZIAGEN) 300 MG tablet Take 2 tablets (600 mg total) by mouth daily.  60 tablet  5  . atovaquone (MEPRON) 750 MG/5ML suspension Take 750 mg by mouth at bedtime.       Marland Kitchen azithromycin (ZITHROMAX) 600 MG tablet Take 2 tablets (1,200 mg total) by mouth every 7 (seven) days. Fridays  8 tablet  5  . carvedilol (COREG) 6.25 MG tablet Take 6.25 mg by mouth 2 (two) times daily with a meal.      . Darunavir Ethanolate (PREZISTA) 800 MG tablet Take 1 tablet (800 mg total) by mouth daily.  30 tablet  5  . HYDROcodone-acetaminophen (NORCO/VICODIN) 5-325 MG per tablet Take 1 tablet by mouth every 4 (four) hours as needed for pain.  Marina  tablet  0  . lamivudine (EPIVIR) 100 MG tablet Take 0.5 tablets (50 mg total) by mouth daily.  16 tablet  11  . losartan (COZAAR) 50 MG tablet Take 50 mg by mouth daily.      . ritonavir (NORVIR) 100 MG TABS tablet Take 1 tablet (100 mg total) by mouth daily.  30 tablet  5  . hydrocortisone (ANUSOL-HC) 25 MG suppository Place 1 suppository (25 mg total) rectally 2 (two) times daily. For 7 days  14 suppository  0  . pramoxine-hydrocortisone (ANALPRAM-HC) 1-1 % rectal cream Place rectally 2 (two) times daily.  30 g  1   No current facility-administered medications for this visit.    Review of Systems Review of Systems  Constitutional: Negative for fever, chills, appetite change and unexpected weight change.  HENT:  Negative for congestion and trouble swallowing.   Eyes: Negative for visual disturbance.  Respiratory: Negative for chest tightness and shortness of breath.   Cardiovascular: Negative for chest pain and leg swelling.       No PND, no orthopnea, no DOE  Gastrointestinal:       See HPI  Genitourinary: Negative for dysuria and hematuria.       Makes little urine. On hemodialysis  Musculoskeletal: Negative.   Skin: Negative for rash.  Neurological: Negative for seizures and speech difficulty.  Hematological: Does not bruise/bleed easily.  Psychiatric/Behavioral: Negative for behavioral problems and confusion.    Blood pressure 92/60, pulse 120, temperature 99.2 F (37.3 C), temperature source Temporal, resp. rate 15, height 5' 7.5" (1.715 m), weight 145 lb (65.772 kg).  Physical Exam Physical Exam  Constitutional: He is oriented to person, place, and time. He appears well-developed and well-nourished. No distress.  HENT:  Head: Normocephalic and atraumatic.  Right Ear: External ear normal.  Left Ear: External ear normal.  Eyes: Conjunctivae are normal. No scleral icterus.  Neck: Normal range of motion. Neck supple. No tracheal deviation present. No thyromegaly present.  Cardiovascular: Normal rate, normal heart sounds and intact distal pulses.   Pulmonary/Chest: Effort normal and breath sounds normal. No respiratory distress. He has no wheezes.  Dialysis line in left upper chest  Abdominal: Soft. He exhibits no distension. There is no tenderness. There is no rebound and no guarding.  Genitourinary: Rectal exam shows anal tone normal.  Patient has some redundant nonthrombosed external hemorrhoidal tissue in the left and right lateral positions. Good tone. Anoscopy reveals left lateral and right lateral internal hemorrhoids. The left lateral hemorrhoid is slightly ulcerated. There is no evidence of prolapse.  Musculoskeletal: Normal range of motion. He exhibits no edema and no tenderness.   Lymphadenopathy:    He has no cervical adenopathy.  Neurological: He is alert and oriented to person, place, and time. He exhibits normal muscle tone.  Skin: Skin is warm and dry. No rash noted. He is not diaphoretic. No erythema. No pallor.  Forearm AV fistula  Psychiatric: He has a normal mood and affect. His behavior is normal. Judgment and thought content normal.    Data Reviewed Labs from 9/24 - wbc 3; hgb 11.6, hct 36.5; MCV 101; CR 16.7; iPTH 1387, alb 3.4; hep B negative Labs from 8/28 - HIV RNA [id number]; CD4 abs 30; CD4 % 4 Dr Linus Salmons office note 07/15/13 Dr Kellie Simmering office note Assessment    Bleeding internal/external hemorrhoids Two column     Plan    We discussed the etiology of hemorrhoids. The patient was given educational material as well as diagrams.  We discussed nonoperative and operative management of hemorrhoidal disease.  We discussed the importance of having a daily soft bowel movement and avoiding constipation. We also discussed good bowel habits such as not reading in the bathroom, not straining, and drinking 6-8 glasses of water per day. We also discussed the importance of a high fiber diet. We discussed foods that were high in fiber as well as fiber supplements. We discussed the importance of trying to get 25-30 g of fiber per day in their diet. We discussed the need to start with a low dose of fiber and then gradually increasing their daily fiber dose over several weeks in order to avoid bloating and cramping.  We then discussed different surgical techniques for hemorrhoids, specifically hemorrhoidal banding and excisional hemorrhoidectomy.  PLAN: Because of his immunocompromised state (low CD4 and high viral load), I have recommended maximizing medical therapy first. If no significant improvement after 6 weeks then will entertain surgery but his CD4 count will need to be better. The patient is limited with the amount of water intake he can take because of his  underlying renal failure. Therefore I recommended him adopting a high fiber diet. We discussed using Benefiber or Metamucil. we also discussed using sitz baths. We also discussed using wet wipes  F/u 6 weeks.   Dylan Ruff. Redmond Pulling, MD, FACS General, Bariatric, & Minimally Invasive Surgery [facility], [place]        [facility] M 08/30/2013, 3:39 PM

## 2013-09-16 ENCOUNTER — Encounter: Payer: Self-pay | Admitting: Internal Medicine

## 2013-09-16 ENCOUNTER — Ambulatory Visit (INDEPENDENT_AMBULATORY_CARE_PROVIDER_SITE_OTHER): Payer: Managed Care, Other (non HMO) | Admitting: Internal Medicine

## 2013-09-16 VITALS — BP 118/79 | HR 109 | Temp 98.3°F | Ht 67.5 in | Wt 140.0 lb

## 2013-09-16 DIAGNOSIS — K644 Residual hemorrhoidal skin tags: Secondary | ICD-10-CM

## 2013-09-16 DIAGNOSIS — B20 Human immunodeficiency virus [HIV] disease: Secondary | ICD-10-CM

## 2013-09-16 DIAGNOSIS — K648 Other hemorrhoids: Secondary | ICD-10-CM

## 2013-09-16 MED ORDER — ATOVAQUONE 750 MG/5ML PO SUSP: 750.0000 mg | Freq: Every day | ORAL | Status: DC

## 2013-09-16 MED ORDER — LAMIVUDINE 100 MG PO TABS: 50.0000 mg | ORAL_TABLET | Freq: Every day | ORAL | Status: DC

## 2013-09-16 MED ORDER — AZITHROMYCIN 600 MG PO TABS: 1200.0000 mg | ORAL_TABLET | ORAL | Status: DC

## 2013-09-16 MED ORDER — RITONAVIR 100 MG PO TABS: 100.0000 mg | ORAL_TABLET | Freq: Every day | ORAL | Status: DC

## 2013-09-16 MED ORDER — DARUNAVIR ETHANOLATE 800 MG PO TABS: 800.0000 mg | ORAL_TABLET | Freq: Every day | ORAL | Status: DC

## 2013-09-16 MED ORDER — ABACAVIR SULFATE 300 MG PO TABS: 600.0000 mg | ORAL_TABLET | Freq: Every day | ORAL | Status: DC

## 2013-09-16 NOTE — Assessment & Plan Note (Addendum)
He has had some improvement in his symptoms but will further discuss with Gen. Surgery in December  Hope fully by then he will have an undetectable virus

## 2013-09-16 NOTE — Assessment & Plan Note (Addendum)
He will now start his regimen and it has been sent to his pharmacy of choice. He will return in about 3 weeks for labs and I will see him awake after that.

## 2013-09-16 NOTE — Progress Notes (Signed)
  Subjective:    Patient ID: Dylan Barber, male    DOB: 1976-08-25, 37 y.o.   MRN: [id number]  HPI  He comes in for follow up. He was newly diagnosed HIV earlier this year while in [place]. He has a low CD4 count at the time and also was noted to be in renal failure and has been on dialysis since. He previously did not know of any medical problems.  He moved back here which is home for him. He is seeing [facility] here in [place].  He is to dialysis Monday Wednesday and Friday. He was going to start on a new regimen including lamivudine 50 mg daily, Prezista, Norvir and abacavir. However in 2 to some confusion with his pharmacy he has not yet started. He though has had no new issues in fact feels well. He continues to be interested in therapy   Review of Systems  Constitutional: Negative for fever, activity change, appetite change, fatigue and unexpected weight change.  HENT: Negative for sore throat and trouble swallowing.   Eyes: Negative for visual disturbance.  Respiratory: Negative for shortness of breath and stridor.   Cardiovascular: Negative for chest pain and leg swelling.  Gastrointestinal: Negative for nausea and diarrhea.  Musculoskeletal: Negative for arthralgias and myalgias.  Skin: Negative for rash.  Neurological: Negative for dizziness, light-headedness and headaches.  Hematological: Negative for adenopathy.  Psychiatric/Behavioral: Negative for dysphoric mood.       Objective:   Physical Exam  Constitutional: He is oriented to person, place, and time. He appears well-developed and well-nourished. No distress.  Eyes: Right eye exhibits no discharge. Left eye exhibits no discharge. No scleral icterus.  Cardiovascular: Normal rate, regular rhythm and normal heart sounds.   No murmur heard. Pulmonary/Chest: Effort normal and breath sounds normal. No respiratory distress.  Lymphadenopathy:    He has no cervical adenopathy.  Neurological: He is alert and  oriented to person, place, and time.  Skin: Skin is warm and dry. No rash noted.  Psychiatric: He has a normal mood and affect. His behavior is normal.          Assessment & Plan:

## 2013-09-22 ENCOUNTER — Inpatient Hospital Stay (HOSPITAL_COMMUNITY): Admit: 2013-09-22 | Payer: Managed Care, Other (non HMO)

## 2013-09-22 ENCOUNTER — Encounter: Payer: Managed Care, Other (non HMO) | Admitting: Vascular Surgery

## 2013-10-05 ENCOUNTER — Other Ambulatory Visit: Payer: Managed Care, Other (non HMO)

## 2013-10-12 ENCOUNTER — Telehealth: Payer: Self-pay | Admitting: *Deleted

## 2013-10-12 NOTE — Telephone Encounter (Signed)
Spoke with patient, confirmed that he knows of his upcoming appointment.  Pt not on meds - meeting with Jasmine December 12/3 for [employer].  Pt states he will be here for his 12/4 visit with Dr. Linus Salmons. Landis Gandy, RN

## 2013-10-13 ENCOUNTER — Telehealth: Payer: Self-pay | Admitting: *Deleted

## 2013-10-13 NOTE — Telephone Encounter (Signed)
Called the [employer].  Dylan Barber was approved for the [employer] through 10-12-14.

## 2013-10-14 ENCOUNTER — Ambulatory Visit: Payer: Managed Care, Other (non HMO) | Admitting: Internal Medicine

## 2013-10-14 ENCOUNTER — Telehealth: Payer: Self-pay | Admitting: *Deleted

## 2013-10-14 NOTE — Telephone Encounter (Signed)
Called patient and left a voice mail to call the clinic to reschedule his appt, he no showed today. Myrtis Hopping

## 2013-10-21 ENCOUNTER — Encounter (INDEPENDENT_AMBULATORY_CARE_PROVIDER_SITE_OTHER): Payer: Managed Care, Other (non HMO) | Admitting: General Surgery

## 2013-10-26 ENCOUNTER — Encounter (INDEPENDENT_AMBULATORY_CARE_PROVIDER_SITE_OTHER): Payer: Self-pay

## 2013-11-18 ENCOUNTER — Encounter (INDEPENDENT_AMBULATORY_CARE_PROVIDER_SITE_OTHER): Payer: Managed Care, Other (non HMO) | Admitting: General Surgery

## 2013-11-30 ENCOUNTER — Encounter: Payer: Self-pay | Admitting: Internal Medicine

## 2013-12-07 ENCOUNTER — Other Ambulatory Visit: Payer: Managed Care, Other (non HMO)

## 2013-12-07 DIAGNOSIS — B20 Human immunodeficiency virus [HIV] disease: Secondary | ICD-10-CM

## 2013-12-07 LAB — CBC WITH DIFFERENTIAL/PLATELET
BASOS ABS: 0 10*3/uL (ref 0.0–0.1)
Basophils Relative: 1 % (ref 0–1)
EOS ABS: 0 10*3/uL (ref 0.0–0.7)
EOS PCT: 1 % (ref 0–5)
HEMATOCRIT: 25.8 % — AB (ref 39.0–52.0)
Hemoglobin: 8.3 g/dL — ABNORMAL LOW (ref 13.0–17.0)
Lymphocytes Relative: 49 % — ABNORMAL HIGH (ref 12–46)
Lymphs Abs: 1.9 10*3/uL (ref 0.7–4.0)
MCH: 30.7 pg (ref 26.0–34.0)
MCHC: 32.2 g/dL (ref 30.0–36.0)
MCV: 95.6 fL (ref 78.0–100.0)
MONO ABS: 0.6 10*3/uL (ref 0.1–1.0)
Monocytes Relative: 15 % — ABNORMAL HIGH (ref 3–12)
Neutro Abs: 1.3 10*3/uL — ABNORMAL LOW (ref 1.7–7.7)
Neutrophils Relative %: 34 % — ABNORMAL LOW (ref 43–77)
PLATELETS: 233 10*3/uL (ref 150–400)
RBC: 2.7 MIL/uL — ABNORMAL LOW (ref 4.22–5.81)
RDW: 15.9 % — AB (ref 11.5–15.5)
WBC: 3.9 10*3/uL — ABNORMAL LOW (ref 4.0–10.5)

## 2013-12-07 LAB — COMPLETE METABOLIC PANEL WITH GFR
ALT: 51 U/L (ref 0–53)
AST: 47 U/L — ABNORMAL HIGH (ref 0–37)
Albumin: 2.7 g/dL — ABNORMAL LOW (ref 3.5–5.2)
Alkaline Phosphatase: 178 U/L — ABNORMAL HIGH (ref 39–117)
BILIRUBIN TOTAL: 0.7 mg/dL (ref 0.3–1.2)
BUN: 22 mg/dL (ref 6–23)
CO2: 30 mEq/L (ref 19–32)
Calcium: 8 mg/dL — ABNORMAL LOW (ref 8.4–10.5)
Chloride: 94 mEq/L — ABNORMAL LOW (ref 96–112)
Creat: 6.35 mg/dL — ABNORMAL HIGH (ref 0.50–1.35)
GFR, EST AFRICAN AMERICAN: 12 mL/min — AB
GFR, Est Non African American: 10 mL/min — ABNORMAL LOW
Glucose, Bld: 106 mg/dL — ABNORMAL HIGH (ref 70–99)
Potassium: 3.7 mEq/L (ref 3.5–5.3)
Sodium: 137 mEq/L (ref 135–145)
Total Protein: 7.3 g/dL (ref 6.0–8.3)

## 2013-12-08 LAB — T-HELPER CELL (CD4) - (RCID CLINIC ONLY)
CD4 % Helper T Cell: 20 % — ABNORMAL LOW (ref 33–55)
CD4 T CELL ABS: 390 /uL — AB (ref 400–2700)

## 2013-12-08 LAB — HIV-1 RNA QUANT-NO REFLEX-BLD
HIV 1 RNA QUANT: 61 {copies}/mL — AB (ref <20)
HIV-1 RNA Quant, Log: 1.79 {Log} — ABNORMAL HIGH (ref <1.30)

## 2013-12-09 ENCOUNTER — Other Ambulatory Visit: Payer: Managed Care, Other (non HMO)

## 2013-12-14 ENCOUNTER — Ambulatory Visit (INDEPENDENT_AMBULATORY_CARE_PROVIDER_SITE_OTHER): Payer: Managed Care, Other (non HMO) | Admitting: Internal Medicine

## 2013-12-14 ENCOUNTER — Encounter: Payer: Self-pay | Admitting: Internal Medicine

## 2013-12-14 VITALS — BP 130/93 | HR 121 | Temp 98.3°F | Ht 67.0 in | Wt 138.0 lb

## 2013-12-14 DIAGNOSIS — B20 Human immunodeficiency virus [HIV] disease: Secondary | ICD-10-CM

## 2013-12-14 DIAGNOSIS — R259 Unspecified abnormal involuntary movements: Secondary | ICD-10-CM

## 2013-12-14 DIAGNOSIS — R253 Fasciculation: Secondary | ICD-10-CM | POA: Insufficient documentation

## 2013-12-14 NOTE — Assessment & Plan Note (Addendum)
Doing well on new regimen.  RTC 3 months.   He can stop his prophylactic medications (azithro and mepron)

## 2013-12-14 NOTE — Progress Notes (Signed)
  Subjective:    Patient ID: Dylan Barber, male    DOB: 1976/02/16, 38 y.o.   MRN: [id number]  HPI  He comes in for follow up. He was newly diagnosed HIV earlier last year while in [place]. He has a low CD4 count at the time and also was noted to be in renal failure and has been on dialysis since. He previously did not know of any medical problems.  He moved back here which is home for him. He is seeing [facility] here in [place].  He is to dialysis Monday Wednesday and Friday. He started lamivudine 50 mg daily, Prezista,  He though has had no new issues in fact feels well. He started and has been on therapy more than one month.  Prevous CD4 30, now 360!  Viral load down to 61.  No complaints with regimen.  Some lower ext muscle jerking.     Review of Systems  Constitutional: Negative for fever, activity change, appetite change, fatigue and unexpected weight change.  HENT: Negative for sore throat and trouble swallowing.   Eyes: Negative for visual disturbance.  Respiratory: Negative for shortness of breath and stridor.   Cardiovascular: Negative for chest pain and leg swelling.  Gastrointestinal: Negative for nausea and diarrhea.  Musculoskeletal: Negative for arthralgias and myalgias.  Skin: Negative for rash.  Neurological: Negative for dizziness, light-headedness and headaches.  Hematological: Negative for adenopathy.  Psychiatric/Behavioral: Negative for dysphoric mood.       Objective:   Physical Exam  Constitutional: He is oriented to person, place, and time. He appears well-developed and well-nourished. No distress.  Eyes: Right eye exhibits no discharge. Left eye exhibits no discharge. No scleral icterus.  Cardiovascular: Normal rate, regular rhythm and normal heart sounds.   No murmur heard. Pulmonary/Chest: Effort normal and breath sounds normal. No respiratory distress.  Lymphadenopathy:    He has no cervical adenopathy.  Neurological: He is alert and  oriented to person, place, and time.  Skin: Skin is warm and dry. No rash noted.  Psychiatric: He has a normal mood and affect. His behavior is normal.          Assessment & Plan:

## 2013-12-14 NOTE — Assessment & Plan Note (Signed)
No known issue with his HIV medications.  No signs of neuropathy

## 2013-12-20 ENCOUNTER — Emergency Department (HOSPITAL_COMMUNITY)
Admission: EM | Admit: 2013-12-20 | Discharge: 2013-12-20 | Disposition: A | Discharge status: Home / Self Care | Payer: Managed Care, Other (non HMO) | Attending: Emergency Medicine | Admitting: Emergency Medicine

## 2013-12-20 ENCOUNTER — Emergency Department (HOSPITAL_COMMUNITY): Payer: Managed Care, Other (non HMO)

## 2013-12-20 ENCOUNTER — Encounter (HOSPITAL_COMMUNITY): Payer: Self-pay | Admitting: Emergency Medicine

## 2013-12-20 DIAGNOSIS — N289 Disorder of kidney and ureter, unspecified: Secondary | ICD-10-CM | POA: Insufficient documentation

## 2013-12-20 DIAGNOSIS — I1311 Hypertensive heart and chronic kidney disease without heart failure, with stage 5 chronic kidney disease, or end stage renal disease: Secondary | ICD-10-CM | POA: Insufficient documentation

## 2013-12-20 DIAGNOSIS — R000 Tachycardia, unspecified: Secondary | ICD-10-CM | POA: Insufficient documentation

## 2013-12-20 DIAGNOSIS — R5383 Other fatigue: Secondary | ICD-10-CM

## 2013-12-20 DIAGNOSIS — R0602 Shortness of breath: Secondary | ICD-10-CM | POA: Insufficient documentation

## 2013-12-20 DIAGNOSIS — N039 Chronic nephritic syndrome with unspecified morphologic changes: Secondary | ICD-10-CM

## 2013-12-20 DIAGNOSIS — R5381 Other malaise: Secondary | ICD-10-CM | POA: Insufficient documentation

## 2013-12-20 DIAGNOSIS — Z992 Dependence on renal dialysis: Secondary | ICD-10-CM | POA: Insufficient documentation

## 2013-12-20 DIAGNOSIS — R011 Cardiac murmur, unspecified: Secondary | ICD-10-CM | POA: Insufficient documentation

## 2013-12-20 DIAGNOSIS — Z79899 Other long term (current) drug therapy: Secondary | ICD-10-CM | POA: Insufficient documentation

## 2013-12-20 DIAGNOSIS — D631 Anemia in chronic kidney disease: Secondary | ICD-10-CM | POA: Insufficient documentation

## 2013-12-20 DIAGNOSIS — B20 Human immunodeficiency virus [HIV] disease: Secondary | ICD-10-CM | POA: Insufficient documentation

## 2013-12-20 DIAGNOSIS — R569 Unspecified convulsions: Secondary | ICD-10-CM | POA: Insufficient documentation

## 2013-12-20 DIAGNOSIS — N186 End stage renal disease: Secondary | ICD-10-CM | POA: Insufficient documentation

## 2013-12-20 DIAGNOSIS — Z21 Asymptomatic human immunodeficiency virus [HIV] infection status: Secondary | ICD-10-CM

## 2013-12-20 LAB — POCT I-STAT, CHEM 8
BUN: 21 mg/dL (ref 6–23)
CHLORIDE: 101 meq/L (ref 96–112)
CREATININE: 6 mg/dL — AB (ref 0.50–1.35)
Calcium, Ion: 1.04 mmol/L — ABNORMAL LOW (ref 1.12–1.23)
Glucose, Bld: 79 mg/dL (ref 70–99)
HCT: 30 % — ABNORMAL LOW (ref 39.0–52.0)
Hemoglobin: 10.2 g/dL — ABNORMAL LOW (ref 13.0–17.0)
Potassium: 4.1 mEq/L (ref 3.7–5.3)
Sodium: 141 mEq/L (ref 137–147)
TCO2: 27 mmol/L (ref 0–100)

## 2013-12-20 LAB — COMPREHENSIVE METABOLIC PANEL
ALT: 35 U/L (ref 0–53)
AST: 33 U/L (ref 0–37)
Albumin: 2.4 g/dL — ABNORMAL LOW (ref 3.5–5.2)
Alkaline Phosphatase: 173 U/L — ABNORMAL HIGH (ref 39–117)
BUN: 22 mg/dL (ref 6–23)
CALCIUM: 8.3 mg/dL — AB (ref 8.4–10.5)
CO2: 27 mEq/L (ref 19–32)
Chloride: 99 mEq/L (ref 96–112)
Creatinine, Ser: 5.85 mg/dL — ABNORMAL HIGH (ref 0.50–1.35)
GFR calc non Af Amer: 11 mL/min — ABNORMAL LOW (ref >90)
GFR, EST AFRICAN AMERICAN: 13 mL/min — AB (ref >90)
Glucose, Bld: 79 mg/dL (ref 70–99)
POTASSIUM: 4.3 meq/L (ref 3.7–5.3)
Sodium: 142 mEq/L (ref 137–147)
TOTAL PROTEIN: 8 g/dL (ref 6.0–8.3)
Total Bilirubin: 0.6 mg/dL (ref 0.3–1.2)

## 2013-12-20 LAB — CBC WITH DIFFERENTIAL/PLATELET
BASOS ABS: 0 10*3/uL (ref 0.0–0.1)
BASOS PCT: 0 % (ref 0–1)
EOS ABS: 0 10*3/uL (ref 0.0–0.7)
EOS PCT: 0 % (ref 0–5)
HCT: 28.5 % — ABNORMAL LOW (ref 39.0–52.0)
Hemoglobin: 9 g/dL — ABNORMAL LOW (ref 13.0–17.0)
LYMPHS ABS: 3.4 10*3/uL (ref 0.7–4.0)
Lymphocytes Relative: 42 % (ref 12–46)
MCH: 31.4 pg (ref 26.0–34.0)
MCHC: 31.6 g/dL (ref 30.0–36.0)
MCV: 99.3 fL (ref 78.0–100.0)
Monocytes Absolute: 1.1 10*3/uL — ABNORMAL HIGH (ref 0.1–1.0)
Monocytes Relative: 13 % — ABNORMAL HIGH (ref 3–12)
Neutro Abs: 3.6 10*3/uL (ref 1.7–7.7)
Neutrophils Relative %: 45 % (ref 43–77)
PLATELETS: 213 10*3/uL (ref 150–400)
RBC: 2.87 MIL/uL — AB (ref 4.22–5.81)
RDW: 17.4 % — ABNORMAL HIGH (ref 11.5–15.5)
WBC: 8.2 10*3/uL (ref 4.0–10.5)

## 2013-12-20 NOTE — ED Notes (Signed)
Pt wanting something to eat.  Advised him that he needed to wait until seen by MD.

## 2013-12-20 NOTE — Discharge Instructions (Signed)
Continue dialysis as scheduled.   Kidney Disease, Adult The kidneys are two organs that lie on either side of the spine between the middle of the back and the front of the abdomen. The kidneys:   Remove wastes and extra water from the blood.   Produce important hormones. These regulate blood pressure, help keep bones strong, and help create red blood cells.   Balance the fluids and chemicals in the blood and tissues. Kidney disease occurs when the kidneys are damaged. Kidney damage may be sudden (acute) or develop over a long period (chronic). A small amount of damage may not cause problems, but a large amount of damage may make it difficult or impossible for the kidneys to work the way they should. Early detection and treatment of kidney disease may prevent kidney damage from becoming permanent or getting worse. Some kidney diseases are curable, but most are not. Many people with kidney disease are able to control the disease and live a normal life.  TYPES OF KIDNEY DISEASE  Acute kidney injury.Acute kidney injury occurs when there is sudden damage to the kidneys.  Chronic kidney disease. Chronic kidney disease occurs when the kidneys are damaged over a long period.  End-stage kidney disease. End-stage kidney disease occurs when the kidneys are so damaged that they stop working. In end-stage kidney disease, the kidneys cannot get better. CAUSES Any condition, disease, or event that damages the kidneys may cause kidney disease. Acute kidney injury.  A problem with blood flow to the kidneys. This may be caused by:   Blood loss.   Heart disease.   Severe burns.   Liver disease.  Direct damage to the kidneys. This may be caused by:  Some medicines.   A kidney infection.   Poisoning or consuming toxic substances.   A surgical wound.   A blow to the kidney area.   A problem with urine flow. This may be caused by:   Cancer.   Kidney stones.   An enlarged  prostate. Chronic kidney disease. The most common causes of chronic kidney disease are diabetes and high blood pressure (hypertension). Chronic kidney disease may also be caused by:   Diseases that cause the filtering units of the kidneys to become inflamed.   Diseases that affect the immune system.   Genetic diseases.   Medicines that damage the kidneys, such as anti-inflammatory medicines.  Poisoning or exposure to toxic substances.   A reoccurring kidney or urinary infection.   A problem with urine flow. This may be caused by:  Cancer.   Kidney stones.   An enlarged prostate in males. End-stage kidney disease. This kidney disease usually occurs when a chronic kidney disease gets worse. It may also occur after acute kidney injury.  SYMPTOMS   Swelling (edema) of the legs, ankles, or feet.   Tiredness (lethargy).   Nausea or vomiting.   Confusion.   Problems with urination, such as:   Painful or burning feeling during urination.   Decreased urine production.  Bloody urine.   Frequent urination, especially at night.  Hypertension.  Muscle twitches and cramps.   Shortness of breath.   Persistent itchiness.   Loss of appetite.  Metallic taste in the mouth.   Weakness.   Seizures.   Chest pain or pressure.   Trouble sleeping.   Headaches.   Abnormally dark or light skin.   Numbness in the hands or feet.   Easy bruising.   Frequent hiccups.   Menstruation stops. Sometimes, no  symptoms are present. DIAGNOSIS  Kidney disease may be detected and diagnosed by tests, including blood, urine, imaging, or kidney biopsy tests.  TREATMENT  Acute kidney injury. Treatment of acute kidney injury varies depending on the cause and severity of the kidney damage. In mild cases, no treatment may be needed. The kidneys may heal on their own. If acute kidney injury is more severe, your caregiver will treat the cause of the kidney  damage, help the kidneys heal, and prevent complications from occurring. Severe cases may require a procedure to remove toxic wastes from the body (dialysis) or surgery to repair kidney damage. Surgery may involve:   Repair of a torn kidney.   Removal of an obstruction.  Most of the time, you will need to stay overnight at the hospital.  Chronic kidney disease. Most chronic kidney diseases cannot be cured. Treatment usually involves relieving symptoms and preventing or slowing the progression of the disease. Treatment may include:   A special diet. You may need to avoid alcohol and foods that:   Have added salt.   Are high in potassium.   Are high in protein.   Medicines. These may:   Lower blood pressure.   Relieve anemia.   Relieve swelling.   Protect the bones.  End-stage kidney disease. End-stage kidney disease is life-threatening and must be treated immediately. There are two treatments for end-stage kidney disease:   Dialysis.   Receiving a new kidney (kidney transplant). Both of these treatments have serious risks and consequences. In addition to having dialysis or a kidney transplant, you may need to take medicines to control hypertension and cholesterol and to decrease phosphorus levels in your blood. LENGTH OF ILLNESS  Acute kidney injury.The length of this disease varies greatly from person to person. Exactly how long it lasts depends on the cause of the kidney damage. Acute kidney injury may develop into chronic kidney disease or end-stage kidney disease.  Chronic kidney disease. This disease usually lasts a lifetime. Chronic kidney disease may worsen over time to become end-stage kidney disease. The time it takes for end-stage kidney disease to develop varies from person to person.  End-stage kidney disease. This disease lasts until a kidney transplant is performed. PREVENTION  Kidney disease can sometimes be prevented. If you have diabetes,  hypertension, or any other condition that may lead to kidney disease, you should try to prevent kidney disease with:   An appropriate diet.  Medicine.  Lifestyle changes. FOR MORE INFORMATION  American Association of Kidney Patients: BombTimer.gl  National Kidney Foundation: www.kidney.[place]: https://mathis.com/  [facility]: www.lifeoptions.org and www.kidneyschool.org  Document Released: 10/28/2005 Document Revised: 10/14/2012 Document Reviewed: 06/26/2012 [facility] Patient Information 2014 [place], [place].

## 2013-12-20 NOTE — ED Provider Notes (Signed)
38 year old male was brought in by police because of bleeding from his AV fistula. He had dialysis today and states that dialysis had to be stopped before he could call all the fluid off and that was needed. He is complaining of some mild dyspnea. Please brought him here after resting him for shoplifting. He states that the bleeding from his AV fistula has now stopped. On exam, lungs are completely clear. He is maintaining adequate oxygen saturation on room air. AV fistulas present and left upper arm without bleeding and with strong thrill present. He is released into police custody.  I saw and evaluated the patient, reviewed the resident's note and I agree with the findings and plan.  EKG Interpretation    Date/Time:  Monday December 20 2013 21:46:42 EST Ventricular Rate:  111 PR Interval:  152 QRS Duration: 78 QT Interval:  386 QTC Calculation: 525 R Axis:   24 Text Interpretation:  Sinus tachycardia Probable left atrial enlargement Anteroseptal infarct, old Nonspecific T abnormalities, lateral leads Prolonged QT interval When compared with ECG of 12/20/2013, No significant change was found Confirmed by Roxanne Mins  MD, Rayah Fines ([id number]) on 12/20/2013 9:57:59 PM              Delora Fuel, MD [id number]

## 2013-12-20 NOTE — ED Notes (Signed)
Pt to ED via [facility] and [employer].   [employer] st's pt was caught stealing at [facility] and after being arrested was taken to Dialysis because he was due to be dialyzed today.  Pt st's he was unable to finish his dialysis due to the machine clotting.  Pt was taken to the jail but jail would not except him until he was medically cleared.  No bleeding from left upper arm at this time.

## 2013-12-20 NOTE — ED Notes (Signed)
Pt from jail, c/o bleeding from fistula. Pt had dialysis today. Started to bleed at jail.NSD

## 2013-12-20 NOTE — ED Notes (Signed)
Called to pt's room, pt sts' the [place] sandwich he received was molded.  After looking at the rest of the sandwich that he had not eaten I advised him that I did not see any mold on the sandwich.  Pt st's that he ate the molded part before he realized it was molded.   The date on the sandwich was dated for today.

## 2013-12-21 NOTE — ED Provider Notes (Signed)
CSN: [id number]     Arrival date & time 12/20/13  1839 History   First MD Initiated Contact with Patient 12/20/13 2031     Chief Complaint  Patient presents with  . Coagulation Disorder    HPI: Mr. Schleper is a 38 yo M with history of HTN, ESRD, HIV and seizures who presents in [employer] custody for evaluation of shortness of breath and fistula bleeding. He was arrested earlier today after shoplifting at [facility]. The police took him to dialysis. The machine clotted and he was only able to get 2.5 hours of treatment. He states he is 4 kg over his dry weight. After dialysis he was placed under arrest. While being transported to jail be began to complain of his arm bleeding. The police officer who accompanies his saw him squeezing his fistula in an attempt to make it bleed. Once he arrived at jail the nurse would not accept him until he was medically cleared. On arrival to the ED he complains of SOB. He denies chest pain, fever, chills, diarrhea, abdominal pain or diarrhea. He also endorses soreness to his fistula.    Past Medical History  Diagnosis Date  . Hypertension   . Dialysis patient   . Renal disorder   . HIV disease   . Anemia   . Seizure   . Heart murmur    Past Surgical History  Procedure Laterality Date  . Av fistula placement Left 05/07/2013    Procedure: ARTERIOVENOUS (AV) FISTULA CREATION- LEFT;  Surgeon: Rosetta Posner, MD;  Location: [place];  Service: Vascular;  Laterality: Left;  . Insertion of dialysis catheter      x 2  . Av fistula placement Left 08/12/2013    Procedure: ARTERIOVENOUS (AV) FISTULA CREATION BRACHIOCEPHALIC;  Surgeon: Angelia Mould, MD;  Location: [place];  Service: Vascular;  Laterality: Left;  . Ligation of arteriovenous  fistula Left 08/12/2013    Procedure: LIGATION OF ARTERIOVENOUS  FISTULA RADIOCEPHALIC;  Surgeon: Angelia Mould, MD;  Location: [facility];  Service: Vascular;  Laterality: Left;   Family History  Problem Relation Age of Onset  . Kidney  failure Mother   . Kidney disease Mother   . Kidney failure Maternal Uncle   . Kidney disease Paternal Uncle   . Kidney disease Maternal Grandfather    History  Substance Use Topics  . Smoking status: Never Smoker   . Smokeless tobacco: Never Used  . Alcohol Use: No    Review of Systems  Constitutional: Positive for fatigue. Negative for fever, chills and appetite change.  Eyes: Negative for photophobia and visual disturbance.  Respiratory: Positive for shortness of breath. Negative for cough.   Cardiovascular: Negative for chest pain and leg swelling.  Gastrointestinal: Negative for nausea, vomiting, abdominal pain, diarrhea and constipation.  Genitourinary: Negative for dysuria, frequency and decreased urine volume.  Musculoskeletal: Positive for myalgias (pain localized to fistula). Negative for arthralgias, back pain and gait problem.  Skin: Negative for color change and wound.  Neurological: Negative for dizziness, syncope, light-headedness and headaches.  Psychiatric/Behavioral: Negative for confusion and agitation.  All other systems reviewed and are negative.      Allergies  Codeine; Eggs or egg-derived products; and Mercury  Home Medications   Current Outpatient Rx  Name  Route  Sig  Dispense  Refill  . abacavir (ZIAGEN) 300 MG tablet   Oral   Take 2 tablets (600 mg total) by mouth daily.   60 tablet   5   .  azithromycin (ZITHROMAX) 600 MG tablet   Oral   Take 1,200 mg by mouth once a week. Take on Friday's         . carvedilol (COREG) 6.25 MG tablet   Oral   Take 6.25 mg by mouth 2 (two) times daily with a meal.         . Darunavir Ethanolate (PREZISTA) 800 MG tablet   Oral   Take 1 tablet (800 mg total) by mouth daily.   30 tablet   5   . lamivudine (EPIVIR) 100 MG tablet   Oral   Take 0.5 tablets (50 mg total) by mouth daily.   16 tablet   5   . losartan (COZAAR) 50 MG tablet   Oral   Take 50 mg by mouth daily.         . Multiple  Vitamin (MULTIVITAMIN) tablet   Oral   Take 1 tablet by mouth daily.         . ritonavir (NORVIR) 100 MG TABS tablet   Oral   Take 1 tablet (100 mg total) by mouth daily.   30 tablet   5   . sevelamer carbonate (RENVELA) 800 MG tablet   Oral   Take 800 mg by mouth 3 (three) times daily with meals.         . sildenafil (VIAGRA) 100 MG tablet   Oral   Take 100 mg by mouth daily as needed for erectile dysfunction.          BP 133/97  Pulse 120  Temp(Src) 98 F (36.7 C) (Oral)  Resp 21  SpO2 100% Physical Exam  Nursing note and vitals reviewed. Constitutional: He is oriented to person, place, and time. No distress.  Chronically ill appearing male, sitting up in bed, speaking in full sentences, does not appear SOB   HENT:  Head: Normocephalic and atraumatic.  Mouth/Throat: Oropharynx is clear and moist.  Eyes: Conjunctivae and EOM are normal. Pupils are equal, round, and reactive to light.  Neck: Normal range of motion. Neck supple.  Cardiovascular: Regular rhythm, normal heart sounds and intact distal pulses.  Tachycardia present.   Pulmonary/Chest: Effort normal and breath sounds normal. No respiratory distress.  Abdominal: Soft. Bowel sounds are normal. There is no tenderness. There is no rebound and no guarding.  Musculoskeletal: Normal range of motion. He exhibits no edema and no tenderness.  Left upper arm AV fistula, strong thrill, no bleeding or overlying ecchymosis noted.   Neurological: He is alert and oriented to person, place, and time. No cranial nerve deficit. Coordination normal.  Skin: Skin is warm and dry. No rash noted.  Psychiatric: He has a normal mood and affect. His behavior is normal.    ED Course  Procedures (including critical care time) Labs Review Labs Reviewed  CBC WITH DIFFERENTIAL - Abnormal; Notable for the following:    RBC 2.87 (*)    Hemoglobin 9.0 (*)    HCT 28.5 (*)    RDW 17.4 (*)    Monocytes Relative 13 (*)    Monocytes  Absolute 1.1 (*)    All other components within normal limits  COMPREHENSIVE METABOLIC PANEL - Abnormal; Notable for the following:    Creatinine, Ser 5.85 (*)    Calcium 8.3 (*)    Albumin 2.4 (*)    Alkaline Phosphatase 173 (*)    GFR calc non Af Amer 11 (*)    GFR calc Af Amer 13 (*)    All other components  within normal limits  POCT I-STAT, CHEM 8 - Abnormal; Notable for the following:    Creatinine, Ser 6.00 (*)    Calcium, Ion 1.04 (*)    Hemoglobin 10.2 (*)    HCT 30.0 (*)    All other components within normal limits   Imaging Review Dg Chest Portable 1 View  12/20/2013   CLINICAL DATA:  Short of breath. Patient unable to finish dialysis. Coagulation disorder.  EXAM: PORTABLE CHEST - 1 VIEW  COMPARISON:  05/05/2013.  FINDINGS: Cardiopericardial silhouette upper normal for projection (AP portable). Calcified granuloma in the right midlung is unchanged. There is pulmonary vascular congestion. No alveolar pulmonary edema. Chronic interstitial prominence is present which has been present dating back to 2014 radiographs. Monitoring leads project over the chest. Mediastinal contours are within normal limits. No airspace disease. There may be a tiny amount pleural fluid on the right.  IMPRESSION: Pulmonary vascular congestion.   Electronically Signed   By: Dereck Ligas M.D.   On: 12/20/2013 21:26    EKG Interpretation    Date/Time:  Monday December 20 2013 21:46:42 EST Ventricular Rate:  111 PR Interval:  152 QRS Duration: 78 QT Interval:  386 QTC Calculation: 525 R Axis:   24 Text Interpretation:  Sinus tachycardia Probable left atrial enlargement Anteroseptal infarct, old Nonspecific T abnormalities, lateral leads Prolonged QT interval When compared with ECG of 12/20/2013, No significant change was found Confirmed by [facility]  MD, DAVID ([id number]) on 12/20/2013 9:57:59 PM            MDM   38 yo M with history of HTN, ESRD and HIV who presents with shortness of breath and bleeding  from AV fistula. On my exam his fistula has a strong thrill, no bleeding or ecchymosis noted. Also complains of SOB but he is breathing comfortably. Oxygen sats normal on RA. Lungs clear. CXR shows vascular congestion without edema. Further no consolidation, widened mediastinum, or PTX. Labs reassuring as Hgb 9 (close to baseline), WBC 8.2, normal lytes, specifically K 4.3, CO2 27. Doubt ACS as ECG without ischemic changes, no chest pain. Doubt PE as he is not hypoxic, no asymmetric leg swelling, no history of VTE. Patient is mildly tachycardic but on review of medical records his HR is chronically between 105-120. I have low suspicion that Mr. Begay has an emergent cause of his symptoms. He has no indication for emergent dialysis. Advised outpatient HD as scheduled. He was felt to be stable for DC to custody of [employer].   Reviewed imaging, labs, ECG and previous medical records, utilized in MDM  Discussed case with Dr. Roxanne Mins  Clinical Impression 1. Shortness of breath 2. Normal evaluation of left upper arm AV fistula.     Louretta Shorten, MD 12/21/13 2256

## 2014-03-01 ENCOUNTER — Other Ambulatory Visit: Payer: Managed Care, Other (non HMO)

## 2014-03-15 ENCOUNTER — Encounter: Payer: Self-pay | Admitting: *Deleted

## 2014-03-15 ENCOUNTER — Telehealth: Payer: Self-pay | Admitting: *Deleted

## 2014-03-15 ENCOUNTER — Ambulatory Visit: Payer: Managed Care, Other (non HMO) | Admitting: Internal Medicine

## 2014-03-15 NOTE — Telephone Encounter (Signed)
Attempted to call patient to reschedule his appt, he no showed lab and MD visit. His phone is currently not in service. Sent him a message on mychart asking him to reschedule.

## 2014-03-24 ENCOUNTER — Non-Acute Institutional Stay (HOSPITAL_COMMUNITY)
Admission: EM | Admit: 2014-03-24 | Discharge: 2014-03-24 | Disposition: A | Discharge status: Home / Self Care | Payer: Medicare Other | Attending: Emergency Medicine | Admitting: Emergency Medicine

## 2014-03-24 ENCOUNTER — Encounter (HOSPITAL_COMMUNITY): Payer: Self-pay | Admitting: Emergency Medicine

## 2014-03-24 ENCOUNTER — Emergency Department (HOSPITAL_COMMUNITY): Payer: Medicare Other

## 2014-03-24 DIAGNOSIS — J811 Chronic pulmonary edema: Secondary | ICD-10-CM

## 2014-03-24 DIAGNOSIS — Z21 Asymptomatic human immunodeficiency virus [HIV] infection status: Secondary | ICD-10-CM | POA: Insufficient documentation

## 2014-03-24 DIAGNOSIS — R0682 Tachypnea, not elsewhere classified: Secondary | ICD-10-CM

## 2014-03-24 DIAGNOSIS — E872 Acidosis, unspecified: Secondary | ICD-10-CM | POA: Insufficient documentation

## 2014-03-24 DIAGNOSIS — Z841 Family history of disorders of kidney and ureter: Secondary | ICD-10-CM | POA: Insufficient documentation

## 2014-03-24 DIAGNOSIS — I120 Hypertensive chronic kidney disease with stage 5 chronic kidney disease or end stage renal disease: Secondary | ICD-10-CM | POA: Insufficient documentation

## 2014-03-24 DIAGNOSIS — R0600 Dyspnea, unspecified: Secondary | ICD-10-CM

## 2014-03-24 DIAGNOSIS — I498 Other specified cardiac arrhythmias: Secondary | ICD-10-CM | POA: Insufficient documentation

## 2014-03-24 DIAGNOSIS — N186 End stage renal disease: Secondary | ICD-10-CM | POA: Diagnosis present

## 2014-03-24 DIAGNOSIS — D649 Anemia, unspecified: Secondary | ICD-10-CM | POA: Insufficient documentation

## 2014-03-24 DIAGNOSIS — R0602 Shortness of breath: Secondary | ICD-10-CM | POA: Insufficient documentation

## 2014-03-24 DIAGNOSIS — Z79899 Other long term (current) drug therapy: Secondary | ICD-10-CM | POA: Insufficient documentation

## 2014-03-24 DIAGNOSIS — E875 Hyperkalemia: Secondary | ICD-10-CM | POA: Insufficient documentation

## 2014-03-24 DIAGNOSIS — Z992 Dependence on renal dialysis: Secondary | ICD-10-CM | POA: Insufficient documentation

## 2014-03-24 DIAGNOSIS — Z794 Long term (current) use of insulin: Secondary | ICD-10-CM | POA: Insufficient documentation

## 2014-03-24 LAB — BASIC METABOLIC PANEL
BUN: 57 mg/dL — AB (ref 6–23)
CHLORIDE: 106 meq/L (ref 96–112)
CO2: 16 mEq/L — ABNORMAL LOW (ref 19–32)
CREATININE: 11.8 mg/dL — AB (ref 0.50–1.35)
Calcium: 9 mg/dL (ref 8.4–10.5)
GFR, EST AFRICAN AMERICAN: 5 mL/min — AB (ref >90)
GFR, EST NON AFRICAN AMERICAN: 5 mL/min — AB (ref >90)
Glucose, Bld: 78 mg/dL (ref 70–99)
Potassium: 5.4 mEq/L — ABNORMAL HIGH (ref 3.7–5.3)
Sodium: 138 mEq/L (ref 137–147)

## 2014-03-24 LAB — CBC
HEMATOCRIT: 36.4 % — AB (ref 39.0–52.0)
Hemoglobin: 11.8 g/dL — ABNORMAL LOW (ref 13.0–17.0)
MCH: 32.1 pg (ref 26.0–34.0)
MCHC: 32.4 g/dL (ref 30.0–36.0)
MCV: 98.9 fL (ref 78.0–100.0)
PLATELETS: 180 10*3/uL (ref 150–400)
RBC: 3.68 MIL/uL — ABNORMAL LOW (ref 4.22–5.81)
RDW: 17 % — ABNORMAL HIGH (ref 11.5–15.5)
WBC: 6.7 10*3/uL (ref 4.0–10.5)

## 2014-03-24 MED ORDER — PENTAFLUOROPROP-TETRAFLUOROETH EX AERO
INHALATION_SPRAY | CUTANEOUS | Status: AC
  Administered 2014-03-24: 1 via TOPICAL
  Filled 2014-03-24: qty 103.5

## 2014-03-24 MED ORDER — SODIUM CHLORIDE 0.9 % IV SOLN: 100.0000 mL | INTRAVENOUS | Status: DC | PRN

## 2014-03-24 MED ORDER — LIDOCAINE HCL (PF) 1 % IJ SOLN: 5.0000 mL | INTRAMUSCULAR | Status: DC | PRN

## 2014-03-24 MED ORDER — LIDOCAINE-PRILOCAINE 2.5-2.5 % EX CREA: 1.0000 "application " | TOPICAL_CREAM | CUTANEOUS | Status: DC | PRN

## 2014-03-24 MED ORDER — PENTAFLUOROPROP-TETRAFLUOROETH EX AERO
1.0000 "application " | INHALATION_SPRAY | CUTANEOUS | Status: DC | PRN
  Administered 2014-03-24: 1 via TOPICAL

## 2014-03-24 MED ORDER — HYDROXYZINE HCL 25 MG PO TABS
ORAL_TABLET | ORAL | Status: AC
  Filled 2014-03-24: qty 1

## 2014-03-24 MED ORDER — NEPRO/CARBSTEADY PO LIQD: 237.0000 mL | ORAL | Status: DC | PRN

## 2014-03-24 MED ORDER — ALTEPLASE 2 MG IJ SOLR
2.0000 mg | Freq: Once | INTRAMUSCULAR | Status: DC | PRN
Start: 2014-03-24 — End: 2014-03-25
  Filled 2014-03-24: qty 2

## 2014-03-24 MED ORDER — HEPARIN SODIUM (PORCINE) 1000 UNIT/ML DIALYSIS: 1000.0000 [IU] | INTRAMUSCULAR | Status: DC | PRN

## 2014-03-24 NOTE — Procedures (Signed)
Patient was seen on dialysis and the procedure was supervised.  BFR 400  Via AVF BP is  157/97.   Patient appears to be tolerating treatment well  Dylan Barber 03/24/2014

## 2014-03-24 NOTE — Discharge Summary (Signed)
Physician Discharge Summary  Patient ID: Dylan Barber. MRN: [id number] DOB/AGE: September 20, 1976 38 y.o.  Admit date: 03/24/2014 Discharge date: 03/24/2014  Admission Diagnoses: needs dialysis  Discharge Diagnoses: same Active Problems:   ESRD (end stage renal disease) on dialysis   Discharged Condition: good  Hospital Course: 38 year old BM with ESRD who presented here- moving to this area without making op dialysis arrangements.  His last HD was Monday.  We performed HD here for volume overload and hyperkalemia and will work on the paperwork to get patient set up at an OP unit in town- hopefully by Monday   Consults: None  Significant Diagnostic Studies: labs:   Treatments: dialysis: Hemodialysis  Discharge Exam: Blood pressure 157/97, pulse 120, temperature 98.3 F (36.8 C), resp. rate 30, weight 72.1 kg (158 lb 15.2 oz), SpO2 98.00%. General appearance: alert and cooperative Resp: diminished breath sounds bilaterally Cardio: regular rate and rhythm, S1, S2 normal, no murmur, click, rub or gallop Extremities: extremities normal, atraumatic, no cyanosis or edema  Disposition: 01-Home or Self Care     Medication List    ASK your doctor about these medications       abacavir 300 MG tablet  Commonly known as:  ZIAGEN  Take 600 mg by mouth daily.     azithromycin 600 MG tablet  Commonly known as:  ZITHROMAX  Take 1,200 mg by mouth once a week. Take on monday     carvedilol 6.25 MG tablet  Commonly known as:  COREG  Take 6.25 mg by mouth 2 (two) times daily with a meal.     lamivudine 100 MG tablet  Commonly known as:  EPIVIR  Take 50 mg by mouth daily.     losartan 50 MG tablet  Commonly known as:  COZAAR  Take 50 mg by mouth daily.     multivitamin tablet  Take 1 tablet by mouth daily.     PREZISTA 800 MG tablet  Generic drug:  Darunavir Ethanolate  Take 800 mg by mouth daily with breakfast.     ritonavir 100 MG Tabs tablet  Commonly known as:   NORVIR  Take 100 mg by mouth daily with breakfast.     sildenafil 100 MG tablet  Commonly known as:  VIAGRA  Take 100 mg by mouth daily as needed for erectile dysfunction.         Signed: Louis Meckel 03/24/2014, 5:16 PM

## 2014-03-24 NOTE — ED Notes (Addendum)
PT began feeling somewhat SOB last night around dinner time. Denies cough, denies fever. Reports BP is high, pt takes medications for these-last dose Tuesday. PT unpacking from a move and meds are still packed.

## 2014-03-24 NOTE — ED Notes (Signed)
MD at bedside.EDP

## 2014-03-24 NOTE — ED Provider Notes (Signed)
CSN: [id number]     Arrival date & time 03/24/14  1100 History   First MD Initiated Contact with Patient 03/24/14 1131     Chief Complaint  Patient presents with  . Shortness of Breath  pt with hx hiv, htn, esrd/hd on MWF c/o feeling sob as if needs dialyses. Just moved from [place]. Had normal HD this Monday. States previously lived here and was pt of [facility], but now lives on [place] side of town w parents. No cough or uri c/o. No fever or chills. No orthopnea or pnd. States otherwise does not feel sick or ill in any way. Normal appetite. No weakness. No nv. States compliant w normal meds although hasnt taken yet today. Symptoms constant, persistent, without specific exacerbating or alleviating factors.     (Consider location/radiation/quality/duration/timing/severity/associated sxs/prior Treatment) Patient is a 38 y.o. male presenting with shortness of breath. The history is provided by the patient.  Shortness of Breath Associated symptoms: no abdominal pain, no chest pain, no cough, no fever, no headaches, no neck pain, no rash, no sore throat and no vomiting     Past Medical History  Diagnosis Date  . Hypertension   . Dialysis patient   . Renal disorder   . HIV disease   . Anemia   . Seizure   . Heart murmur    Past Surgical History  Procedure Laterality Date  . Av fistula placement Left 05/07/2013    Procedure: ARTERIOVENOUS (AV) FISTULA CREATION- LEFT;  Surgeon: Rosetta Posner, MD;  Location: [place];  Service: Vascular;  Laterality: Left;  . Insertion of dialysis catheter      x 2  . Av fistula placement Left 08/12/2013    Procedure: ARTERIOVENOUS (AV) FISTULA CREATION BRACHIOCEPHALIC;  Surgeon: Angelia Mould, MD;  Location: [place];  Service: Vascular;  Laterality: Left;  . Ligation of arteriovenous  fistula Left 08/12/2013    Procedure: LIGATION OF ARTERIOVENOUS  FISTULA RADIOCEPHALIC;  Surgeon: Angelia Mould, MD;  Location: [facility] OR;  Service: Vascular;   Laterality: Left;   Family History  Problem Relation Age of Onset  . Kidney failure Mother   . Kidney disease Mother   . Kidney failure Maternal Uncle   . Kidney disease Paternal Uncle   . Kidney disease Maternal Grandfather    History  Substance Use Topics  . Smoking status: Never Smoker   . Smokeless tobacco: Never Used  . Alcohol Use: No    Review of Systems  Constitutional: Negative for fever and chills.  HENT: Negative for sore throat.   Eyes: Negative for redness.  Respiratory: Positive for shortness of breath. Negative for cough.   Cardiovascular: Negative for chest pain, palpitations and leg swelling.  Gastrointestinal: Negative for nausea, vomiting and abdominal pain.  Genitourinary: Negative for flank pain.  Musculoskeletal: Negative for back pain and neck pain.  Skin: Negative for rash.  Neurological: Negative for headaches.  Hematological: Does not bruise/bleed easily.  Psychiatric/Behavioral: Negative for confusion.      Allergies  Codeine; Eggs or egg-derived products; and Mercury  Home Medications   Prior to Admission medications   Medication Sig Start Date End Date Taking? Authorizing Provider  abacavir (ZIAGEN) 300 MG tablet Take 600 mg by mouth daily.   Yes Historical Provider, MD  azithromycin (ZITHROMAX) 600 MG tablet Take 1,200 mg by mouth once a week. Take on monday   Yes Historical Provider, MD  carvedilol (COREG) 6.25 MG tablet Take 6.25 mg by mouth 2 (two) times  daily with a meal.   Yes Historical Provider, MD  Darunavir Ethanolate (PREZISTA) 800 MG tablet Take 800 mg by mouth daily with breakfast.   Yes Historical Provider, MD  lamivudine (EPIVIR) 100 MG tablet Take 50 mg by mouth daily.   Yes Historical Provider, MD  losartan (COZAAR) 50 MG tablet Take 50 mg by mouth daily.   Yes Historical Provider, MD  Multiple Vitamin (MULTIVITAMIN) tablet Take 1 tablet by mouth daily.   Yes Historical Provider, MD  ritonavir (NORVIR) 100 MG TABS tablet  Take 100 mg by mouth daily with breakfast.   Yes Historical Provider, MD  sildenafil (VIAGRA) 100 MG tablet Take 100 mg by mouth daily as needed for erectile dysfunction.   Yes Historical Provider, MD   BP 169/113  Pulse 120  Temp(Src) 98 F (36.7 C)  Resp 16  Wt 159 lb (72.122 kg)  SpO2 100% Physical Exam  Nursing note and vitals reviewed. Constitutional: He is oriented to person, place, and time. He appears well-developed and well-nourished. No distress.  HENT:  Mouth/Throat: Oropharynx is clear and moist.  Eyes: Conjunctivae are normal. No scleral icterus.  Neck: Neck supple. No JVD present. No tracheal deviation present.  Cardiovascular: Regular rhythm, normal heart sounds and intact distal pulses.  Exam reveals no gallop and no friction rub.   No murmur heard. Pulmonary/Chest: Effort normal and breath sounds normal. No accessory muscle usage. No respiratory distress.  Abdominal: Soft. Bowel sounds are normal. He exhibits no distension. There is no tenderness.  Musculoskeletal: Normal range of motion. He exhibits no edema and no tenderness.  Left UE fistula w palp thrill.   Neurological: He is alert and oriented to person, place, and time.  Skin: Skin is warm and dry. He is not diaphoretic.  Psychiatric: He has a normal mood and affect.    ED Course  Procedures (including critical care time) Labs Review  Results for orders placed during the hospital encounter of 03/24/14  CBC      Result Value Ref Range   WBC 6.7  4.0 - 10.5 K/uL   RBC 3.68 (*) 4.22 - 5.81 MIL/uL   Hemoglobin 11.8 (*) 13.0 - 17.0 g/dL   HCT 36.4 (*) 39.0 - 52.0 %   MCV 98.9  78.0 - 100.0 fL   MCH 32.1  26.0 - 34.0 pg   MCHC 32.4  30.0 - 36.0 g/dL   RDW 17.0 (*) 11.5 - 15.5 %   Platelets 180  150 - 400 K/uL  BASIC METABOLIC PANEL      Result Value Ref Range   Sodium 138  137 - 147 mEq/L   Potassium 5.4 (*) 3.7 - 5.3 mEq/L   Chloride 106  96 - 112 mEq/L   CO2 16 (*) 19 - 32 mEq/L   Glucose, Bld 78   70 - 99 mg/dL   BUN 57 (*) 6 - 23 mg/dL   Creatinine, Ser 11.80 (*) 0.50 - 1.35 mg/dL   Calcium 9.0  8.4 - 10.5 mg/dL   GFR calc non Af Amer 5 (*) >90 mL/min   GFR calc Af Amer 5 (*) >90 mL/min   Dg Chest 2 View  03/24/2014   CLINICAL DATA:  pain  EXAM: CHEST  2 VIEW  COMPARISON:  DG CHEST 1V PORT dated 12/20/2013  FINDINGS: Low lung volumes. Cardiac silhouette is enlarged. There is diffuse thickening of interstitial markings and mild peribronchial cuffing. Linear areas of increased density left lung base. Stable calcified granuloma right lung.  No acute osseous abnormalities.  IMPRESSION: Mild pulmonary edema.  Scarring versus atelectasis left lung base.   Electronically Signed   By: Margaree Mackintosh M.D.   On: 03/24/2014 12:51        EKG Interpretation   Date/Time:  Thursday Mar 24 2014 12:28:16 EDT Ventricular Rate:  114 PR Interval:  159 QRS Duration: 78 QT Interval:  341 QTC Calculation: 470 R Axis:   55 Text Interpretation:  Sinus tachycardia Borderline T abnormalities,  inferior leads No significant change since last tracing Confirmed by  Ashok Cordia  MD, Lennette Bihari ([id number]) on 03/24/2014 12:54:34 PM      MDM  Iv ns. Monitor. Continuous pulse ox.  Labs. Cxr. 02 [place].  [facility] called.  Reviewed nursing notes and prior charts for additional history.   Pt w esrd/hd, needs emergent dialyses.   hco3 low, 16. k high 5.4. Pt hypertensive. Dyspneic. Tachypneic.  Discussed pt and labs w [facility] on call, Dr Moshe Cipro - they will arrange emergent HD now and subsequent outpatient HD.  Recheck pt, condition unchanged from prior - updated on plan.  CRITICAL CARE  RE End stage renal disease, dialyses, dyspnea, pulmonary edema, hyperkalemia, metabolic acidosis, hypertension.  Performed by: Mirna Mires Total critical care time: 29 Critical care time was exclusive of separately billable procedures and treating other patients. Critical care was necessary to treat or prevent  imminent or life-threatening deterioration. Critical care was time spent personally by me on the following activities: development of treatment plan with patient and/or surrogate as well as nursing, discussions with consultants, evaluation of patient's response to treatment, examination of patient, obtaining history from patient or surrogate, ordering and performing treatments and interventions, ordering and review of laboratory studies, ordering and review of radiographic studies, pulse oximetry and re-evaluation of patient's condition.     Mirna Mires, MD 03/24/14 1320

## 2014-03-24 NOTE — ED Notes (Signed)
Cleared ice chips with EDP. Gave PT small amt of ice chips

## 2014-03-24 NOTE — ED Notes (Signed)
6th floor near [place] code to get in.

## 2014-03-24 NOTE — H&P (Signed)
HPI: Dylan Barber. is a 38 y.o. male who presented to the ED for SOB. He receives HD MWF, ESRD secondary to HTN, has been on HD since 2013, has previously been to [facility]. He lived in [place] and just moved back to [place] this week to live with his parents. History of HTN and HIV.  His last HD was Monday. He reports he was feeling well until he began experiencing SOB around dinnertime last night. Patient really in NAD- cant tell us where the last place he dialyzed was ?  Past Medical History  Diagnosis Date  . Hypertension   . Dialysis patient   . Renal disorder   . HIV disease   . Anemia   . Seizure   . Heart murmur    Past Surgical History  Procedure Laterality Date  . Av fistula placement Left 05/07/2013    Procedure: ARTERIOVENOUS (AV) FISTULA CREATION- LEFT;  Surgeon: Rosetta Posner, MD;  Location: [place];  Service: Vascular;  Laterality: Left;  . Insertion of dialysis catheter      x 2  . Av fistula placement Left 08/12/2013    Procedure: ARTERIOVENOUS (AV) FISTULA CREATION BRACHIOCEPHALIC;  Surgeon: Angelia Mould, MD;  Location: [place];  Service: Vascular;  Laterality: Left;  . Ligation of arteriovenous  fistula Left 08/12/2013    Procedure: LIGATION OF ARTERIOVENOUS  FISTULA RADIOCEPHALIC;  Surgeon: Angelia Mould, MD;  Location: [facility] OR;  Service: Vascular;  Laterality: Left;   Family History  Problem Relation Age of Onset  . Kidney failure Mother   . Kidney disease Mother   . Kidney failure Maternal Uncle   . Kidney disease Paternal Uncle   . Kidney disease Maternal Grandfather    Social History:  reports that he has never smoked. He has never used smokeless tobacco. He reports that he does not drink alcohol or use illicit drugs. Allergies  Allergen Reactions  . Codeine Itching  . Eggs Or Egg-Derived Products     Shortness of breath  . Mercury     unknown   Prior to Admission medications   Medication Sig Start Date End Date Taking?  Authorizing Provider  abacavir (ZIAGEN) 300 MG tablet Take 600 mg by mouth daily.   Yes Historical Provider, MD  azithromycin (ZITHROMAX) 600 MG tablet Take 1,200 mg by mouth once a week. Take on monday   Yes Historical Provider, MD  carvedilol (COREG) 6.25 MG tablet Take 6.25 mg by mouth 2 (two) times daily with a meal.   Yes Historical Provider, MD  Darunavir Ethanolate (PREZISTA) 800 MG tablet Take 800 mg by mouth daily with breakfast.   Yes Historical Provider, MD  lamivudine (EPIVIR) 100 MG tablet Take 50 mg by mouth daily.   Yes Historical Provider, MD  losartan (COZAAR) 50 MG tablet Take 50 mg by mouth daily.   Yes Historical Provider, MD  Multiple Vitamin (MULTIVITAMIN) tablet Take 1 tablet by mouth daily.   Yes Historical Provider, MD  ritonavir (NORVIR) 100 MG TABS tablet Take 100 mg by mouth daily with breakfast.   Yes Historical Provider, MD  sildenafil (VIAGRA) 100 MG tablet Take 100 mg by mouth daily as needed for erectile dysfunction.   Yes Historical Provider, MD   No current facility-administered medications for this encounter.   Current Outpatient Prescriptions  Medication Sig Dispense Refill  . abacavir (ZIAGEN) 300 MG tablet Take 600 mg by mouth daily.      Marland Kitchen azithromycin (ZITHROMAX) 600 MG  tablet Take 1,200 mg by mouth once a week. Take on monday      . carvedilol (COREG) 6.25 MG tablet Take 6.25 mg by mouth 2 (two) times daily with a meal.      . Darunavir Ethanolate (PREZISTA) 800 MG tablet Take 800 mg by mouth daily with breakfast.      . lamivudine (EPIVIR) 100 MG tablet Take 50 mg by mouth daily.      Marland Kitchen losartan (COZAAR) 50 MG tablet Take 50 mg by mouth daily.      . Multiple Vitamin (MULTIVITAMIN) tablet Take 1 tablet by mouth daily.      . ritonavir (NORVIR) 100 MG TABS tablet Take 100 mg by mouth daily with breakfast.      . sildenafil (VIAGRA) 100 MG tablet Take 100 mg by mouth daily as needed for erectile dysfunction.       Labs: Basic Metabolic  Panel:  Recent Labs Lab 03/24/14 1223  NA 138  K 5.4*  CL 106  CO2 16*  GLUCOSE 78  BUN 57*  CREATININE 11.80*  CALCIUM 9.0   Liver Function Tests: No results found for this basename: AST, ALT, ALKPHOS, BILITOT, PROT, ALBUMIN,  in the last 168 hours No results found for this basename: LIPASE, AMYLASE,  in the last 168 hours No results found for this basename: AMMONIA,  in the last 168 hours CBC:  Recent Labs Lab 03/24/14 1223  WBC 6.7  HGB 11.8*  HCT 36.4*  MCV 98.9  PLT 180   Cardiac Enzymes: No results found for this basename: CKTOTAL, CKMB, CKMBINDEX, TROPONINI,  in the last 168 hours CBG: No results found for this basename: GLUCAP,  in the last 168 hours Iron Studies: No results found for this basename: IRON, TIBC, TRANSFERRIN, FERRITIN,  in the last 72 hours Studies/Results: Dg Chest 2 View  03/24/2014   CLINICAL DATA:  pain  EXAM: CHEST  2 VIEW  COMPARISON:  DG CHEST 1V PORT dated 12/20/2013  FINDINGS: Low lung volumes. Cardiac silhouette is enlarged. There is diffuse thickening of interstitial markings and mild peribronchial cuffing. Linear areas of increased density left lung base. Stable calcified granuloma right lung. No acute osseous abnormalities.  IMPRESSION: Mild pulmonary edema.  Scarring versus atelectasis left lung base.   Electronically Signed   By: Margaree Mackintosh M.D.   On: 03/24/2014 12:51    Review of Systems: Negative except for SOB  Physical Exam: Filed Vitals:   03/24/14 1300 03/24/14 1315 03/24/14 1330 03/24/14 1345  BP: 147/107 149/105 151/123 153/106  Pulse: 120 120 121 124  Temp:      Resp: 23 27 26 19   Weight:      SpO2: 100% 100% 100% 100%     General: Well developed, well nourished, in no acute distress. Head: Normocephalic, atraumatic, sclera non-icteric, mucus membranes are moist Neck: Supple. JVD not elevated. No carotid bruits Lungs: Clear bilaterally to auscultation without wheezes, rales, or rhonchi. Breathing is  unlabored. Heart: RRR with S1 S2. No murmurs, rubs, or gallops appreciated. Abdomen: Soft, non-tender, non-distended with normoactive bowel sounds. No rebound/guarding. No obvious abdominal masses. M-S:  Strength and tone appear normal for age. Lower extremities: trace LE edema Neuro: Alert and oriented X 3. Moves all extremities spontaneously. Psych:  Responds to questions appropriately with a normal affect. Dialysis Access: LUA AVF + bruit/thrill  Dialysis Orders:  4hr  64.5kg  Assessment/Plan: 1.  sob- secondary to missed HD. Chest xray- mild pulm edema. HE pending today 2.  ESRD -  MWF, missed last tx. K+5.4 will need outpt center. Trying to get records.  Our plan will be to do dialysis here this evening, let him be discharged and work thru [facility] and [facility] unit (last unit he was dialyzed here) to get him an OP spot by Monday- hopefully that will happen so he will not need to come back here.  3.  Hypertension/volume  - 153/106. Home meds of losartan and carvedilol. Has not taken meds today 4.  Anemia  -  hgb 11.8 5.  Metabolic bone disease - Ca+ Clare, NP [employer] [phone] 03/24/2014, 2:10 PM   Patient seen and examined, agree with above note with above modifications.  Corliss Parish, MD 03/24/2014

## 2014-03-24 NOTE — ED Notes (Signed)
Sob started last night pt is due dialysis he  Is mwf states last had it Monday in [place] has just moved here

## 2014-03-24 NOTE — ED Notes (Signed)
PT satting 100% on RA. Had given 02 [place] 2L for comfort w/ SOB

## 2014-03-25 LAB — HEPATITIS B CORE ANTIBODY, TOTAL: Hep B Core Total Ab: NONREACTIVE

## 2014-03-25 LAB — HEPATITIS B SURFACE ANTIGEN: HEP B S AG: NEGATIVE

## 2014-03-25 LAB — HEPATITIS B SURFACE ANTIBODY,QUALITATIVE: Hep B S Ab: POSITIVE — AB

## 2014-03-27 ENCOUNTER — Inpatient Hospital Stay (HOSPITAL_COMMUNITY)
Admission: EM | Admit: 2014-03-27 | Discharge: 2014-03-29 | DRG: 682 | Disposition: A | Discharge status: Home / Self Care | Payer: Medicare Other | Attending: Internal Medicine | Admitting: Internal Medicine

## 2014-03-27 ENCOUNTER — Encounter (HOSPITAL_COMMUNITY): Payer: Self-pay | Admitting: Emergency Medicine

## 2014-03-27 ENCOUNTER — Emergency Department (HOSPITAL_COMMUNITY): Payer: Medicare Other

## 2014-03-27 DIAGNOSIS — N186 End stage renal disease: Secondary | ICD-10-CM | POA: Diagnosis not present

## 2014-03-27 DIAGNOSIS — Z992 Dependence on renal dialysis: Secondary | ICD-10-CM

## 2014-03-27 DIAGNOSIS — B20 Human immunodeficiency virus [HIV] disease: Secondary | ICD-10-CM | POA: Diagnosis present

## 2014-03-27 DIAGNOSIS — R253 Fasciculation: Secondary | ICD-10-CM

## 2014-03-27 DIAGNOSIS — K648 Other hemorrhoids: Secondary | ICD-10-CM

## 2014-03-27 DIAGNOSIS — R0789 Other chest pain: Secondary | ICD-10-CM | POA: Diagnosis present

## 2014-03-27 DIAGNOSIS — D649 Anemia, unspecified: Secondary | ICD-10-CM | POA: Diagnosis present

## 2014-03-27 DIAGNOSIS — R079 Chest pain, unspecified: Secondary | ICD-10-CM | POA: Diagnosis not present

## 2014-03-27 DIAGNOSIS — J811 Chronic pulmonary edema: Secondary | ICD-10-CM | POA: Diagnosis present

## 2014-03-27 DIAGNOSIS — E875 Hyperkalemia: Secondary | ICD-10-CM | POA: Diagnosis present

## 2014-03-27 DIAGNOSIS — N289 Disorder of kidney and ureter, unspecified: Secondary | ICD-10-CM

## 2014-03-27 DIAGNOSIS — I120 Hypertensive chronic kidney disease with stage 5 chronic kidney disease or end stage renal disease: Principal | ICD-10-CM | POA: Diagnosis present

## 2014-03-27 DIAGNOSIS — Z7982 Long term (current) use of aspirin: Secondary | ICD-10-CM

## 2014-03-27 DIAGNOSIS — K644 Residual hemorrhoidal skin tags: Secondary | ICD-10-CM

## 2014-03-27 DIAGNOSIS — I10 Essential (primary) hypertension: Secondary | ICD-10-CM

## 2014-03-27 DIAGNOSIS — R197 Diarrhea, unspecified: Secondary | ICD-10-CM

## 2014-03-27 LAB — D-DIMER, QUANTITATIVE (NOT AT ARMC): D-Dimer, Quant: 1.56 ug/mL-FEU — ABNORMAL HIGH (ref 0.00–0.48)

## 2014-03-27 LAB — CBC WITH DIFFERENTIAL/PLATELET
BASOS ABS: 0 10*3/uL (ref 0.0–0.1)
BASOS PCT: 0 % (ref 0–1)
Eosinophils Absolute: 0.2 10*3/uL (ref 0.0–0.7)
Eosinophils Relative: 3 % (ref 0–5)
HCT: 35.3 % — ABNORMAL LOW (ref 39.0–52.0)
Hemoglobin: 11.9 g/dL — ABNORMAL LOW (ref 13.0–17.0)
LYMPHS PCT: 50 % — AB (ref 12–46)
Lymphs Abs: 3.3 10*3/uL (ref 0.7–4.0)
MCH: 33.2 pg (ref 26.0–34.0)
MCHC: 33.7 g/dL (ref 30.0–36.0)
MCV: 98.6 fL (ref 78.0–100.0)
Monocytes Absolute: 0.4 10*3/uL (ref 0.1–1.0)
Monocytes Relative: 7 % (ref 3–12)
NEUTROS ABS: 2.7 10*3/uL (ref 1.7–7.7)
Neutrophils Relative %: 40 % — ABNORMAL LOW (ref 43–77)
PLATELETS: 201 10*3/uL (ref 150–400)
RBC: 3.58 MIL/uL — ABNORMAL LOW (ref 4.22–5.81)
RDW: 16.8 % — AB (ref 11.5–15.5)
WBC: 6.6 10*3/uL (ref 4.0–10.5)

## 2014-03-27 LAB — BASIC METABOLIC PANEL
BUN: 65 mg/dL — ABNORMAL HIGH (ref 6–23)
CHLORIDE: 99 meq/L (ref 96–112)
CO2: 20 mEq/L (ref 19–32)
CREATININE: 12.19 mg/dL — AB (ref 0.50–1.35)
Calcium: 9.4 mg/dL (ref 8.4–10.5)
GFR calc non Af Amer: 4 mL/min — ABNORMAL LOW (ref >90)
GFR, EST AFRICAN AMERICAN: 5 mL/min — AB (ref >90)
Glucose, Bld: 87 mg/dL (ref 70–99)
POTASSIUM: 6.1 meq/L — AB (ref 3.7–5.3)
SODIUM: 139 meq/L (ref 137–147)

## 2014-03-27 LAB — PHOSPHORUS: PHOSPHORUS: 4.1 mg/dL (ref 2.3–4.6)

## 2014-03-27 LAB — MAGNESIUM: Magnesium: 2.8 mg/dL — ABNORMAL HIGH (ref 1.5–2.5)

## 2014-03-27 LAB — TROPONIN I
Troponin I: <0.3 ng/mL (ref <0.30)
Troponin I: <0.3 ng/mL (ref <0.30)

## 2014-03-27 MED ORDER — AZITHROMYCIN 600 MG PO TABS
1200.0000 mg | ORAL_TABLET | ORAL | Status: DC
Start: 2014-03-28 — End: 2014-03-29
  Administered 2014-03-28: 1200 mg via ORAL
  Filled 2014-03-27: qty 2

## 2014-03-27 MED ORDER — CARVEDILOL 6.25 MG PO TABS
6.2500 mg | ORAL_TABLET | Freq: Two times a day (BID) | ORAL | Status: DC
  Administered 2014-03-27 – 2014-03-29 (×3): 6.25 mg via ORAL
  Filled 2014-03-27 (×6): qty 1

## 2014-03-27 MED ORDER — SODIUM CHLORIDE 0.9 % IJ SOLN: 3.0000 mL | Freq: Two times a day (BID) | INTRAMUSCULAR | Status: DC

## 2014-03-27 MED ORDER — SODIUM CHLORIDE 0.9 % IJ SOLN
3.0000 mL | Freq: Two times a day (BID) | INTRAMUSCULAR | Status: DC
  Administered 2014-03-27 – 2014-03-28 (×2): 3 mL via INTRAVENOUS

## 2014-03-27 MED ORDER — ASPIRIN EC 81 MG PO TBEC
81.0000 mg | DELAYED_RELEASE_TABLET | Freq: Every day | ORAL | Status: DC
  Administered 2014-03-28 – 2014-03-29 (×2): 81 mg via ORAL
  Filled 2014-03-27 (×2): qty 1

## 2014-03-27 MED ORDER — ONDANSETRON HCL 4 MG/2ML IJ SOLN: 4.0000 mg | Freq: Four times a day (QID) | INTRAMUSCULAR | Status: DC | PRN

## 2014-03-27 MED ORDER — LAMIVUDINE 100 MG PO TABS: 50.0000 mg | ORAL_TABLET | Freq: Every day | ORAL | Status: DC

## 2014-03-27 MED ORDER — LAMIVUDINE 10 MG/ML PO SOLN
50.0000 mg | Freq: Every day | ORAL | Status: DC
  Administered 2014-03-27 – 2014-03-29 (×3): 50 mg via ORAL
  Filled 2014-03-27 (×3): qty 5

## 2014-03-27 MED ORDER — NITROGLYCERIN 2 % TD OINT
0.5000 [in_us] | TOPICAL_OINTMENT | Freq: Four times a day (QID) | TRANSDERMAL | Status: AC
  Administered 2014-03-27 – 2014-03-28 (×2): 0.5 [in_us] via TOPICAL
  Filled 2014-03-27: qty 30

## 2014-03-27 MED ORDER — HEPARIN SODIUM (PORCINE) 5000 UNIT/ML IJ SOLN
5000.0000 [IU] | Freq: Three times a day (TID) | INTRAMUSCULAR | Status: DC
  Administered 2014-03-27 – 2014-03-28 (×3): 5000 [IU] via SUBCUTANEOUS
  Filled 2014-03-27 (×8): qty 1

## 2014-03-27 MED ORDER — ONDANSETRON HCL 4 MG PO TABS: 4.0000 mg | ORAL_TABLET | Freq: Four times a day (QID) | ORAL | Status: DC | PRN

## 2014-03-27 MED ORDER — IOHEXOL 350 MG/ML SOLN
100.0000 mL | Freq: Once | INTRAVENOUS | Status: AC | PRN
  Administered 2014-03-27: 100 mL via INTRAVENOUS

## 2014-03-27 MED ORDER — SODIUM POLYSTYRENE SULFONATE 15 GM/60ML PO SUSP
30.0000 g | Freq: Once | ORAL | Status: AC
  Administered 2014-03-27: 30 g via ORAL
  Filled 2014-03-27: qty 120

## 2014-03-27 MED ORDER — LOPERAMIDE HCL 2 MG PO CAPS
4.0000 mg | ORAL_CAPSULE | Freq: Once | ORAL | Status: DC
  Filled 2014-03-27: qty 2

## 2014-03-27 MED ORDER — ALBUTEROL SULFATE (2.5 MG/3ML) 0.083% IN NEBU: 2.5000 mg | INHALATION_SOLUTION | RESPIRATORY_TRACT | Status: DC | PRN

## 2014-03-27 MED ORDER — SODIUM CHLORIDE 0.9 % IV SOLN: 250.0000 mL | INTRAVENOUS | Status: DC | PRN

## 2014-03-27 MED ORDER — SODIUM CHLORIDE 0.9 % IJ SOLN: 3.0000 mL | INTRAMUSCULAR | Status: DC | PRN

## 2014-03-27 MED ORDER — MORPHINE SULFATE 2 MG/ML IJ SOLN: 1.0000 mg | INTRAMUSCULAR | Status: DC | PRN

## 2014-03-27 MED ORDER — RITONAVIR 100 MG PO TABS
100.0000 mg | ORAL_TABLET | Freq: Every day | ORAL | Status: DC
  Administered 2014-03-29: 100 mg via ORAL
  Filled 2014-03-27 (×3): qty 1

## 2014-03-27 MED ORDER — ACETAMINOPHEN 325 MG PO TABS
650.0000 mg | ORAL_TABLET | Freq: Four times a day (QID) | ORAL | Status: DC | PRN
Start: 2014-03-27 — End: 2014-03-28
  Administered 2014-03-28: 650 mg via ORAL
  Filled 2014-03-27: qty 2

## 2014-03-27 MED ORDER — DARUNAVIR ETHANOLATE 800 MG PO TABS
800.0000 mg | ORAL_TABLET | Freq: Every day | ORAL | Status: DC
  Administered 2014-03-29: 800 mg via ORAL
  Filled 2014-03-27 (×3): qty 1

## 2014-03-27 MED ORDER — ABACAVIR SULFATE 300 MG PO TABS
600.0000 mg | ORAL_TABLET | Freq: Every day | ORAL | Status: DC
  Administered 2014-03-27 – 2014-03-29 (×3): 600 mg via ORAL
  Filled 2014-03-27 (×3): qty 2

## 2014-03-27 MED ORDER — HYDRALAZINE HCL 20 MG/ML IJ SOLN
5.0000 mg | Freq: Once | INTRAMUSCULAR | Status: DC
  Filled 2014-03-27: qty 1

## 2014-03-27 MED ORDER — ACETAMINOPHEN 650 MG RE SUPP: 650.0000 mg | Freq: Four times a day (QID) | RECTAL | Status: DC | PRN

## 2014-03-27 NOTE — H&P (Signed)
[facility] History and Physical  Dylan Barber Dylan Barber. KNL:[id number] DOB: [birth year] DOA: 03/27/2014   PCP: Does not have a dedicated primary care physician. He is followed by infectious disease and nephrology. Specialists: he is followed by Dr. Linus Salmons, with the infectious disease clinic for his HIV  Chief Complaint: shortness of breath  HPI: Dylan Barber. is a 38 y.o. male with a past medical history of HIV disease, and end-stage renal disease with hypertension who recently moved back to [place] from [place]. He initially started feeling short of breath on Wednesday. He presented to the emergency department on Thursday, and had to be admitted to the nephrology service for dialysis. He was subsequently discharged home. He was told that he'll be set up for outpatient dialysis but he never heard back. And, then yesterday afternoon he started feeling short of breath. He felt like he had gained weight. He also noticed leg swelling. He started getting pressure-like sensation in the left side of his chest. It would increase with deep breathing. Was 7/10 in intensity. Denies any fever or chills. Some nausea. He's had a few episodes of diarrhea. He admits to eating out at fast food joints over the last few days including red lobster, [employer] and taco bell and [place] food. Denies any abdominal pain. No blood in the stool. No sick contacts. He decided to present to the hospital today because of worsening shortness of breath. Denies any dizzy spells. No syncopal episodes.  Home Medications: Prior to Admission medications   Medication Sig Start Date End Date Taking? Authorizing Provider  abacavir (ZIAGEN) 300 MG tablet Take 600 mg by mouth daily.   Yes Historical Provider, MD  azithromycin (ZITHROMAX) 600 MG tablet Take 1,200 mg by mouth once a week. Mondays   Yes Historical Provider, MD  carvedilol (COREG) 6.25 MG tablet Take 6.25 mg by mouth 2 (two) times daily with a meal.   Yes  Historical Provider, MD  Darunavir Ethanolate (PREZISTA) 800 MG tablet Take 800 mg by mouth daily with breakfast.   Yes Historical Provider, MD  lamivudine (EPIVIR) 100 MG tablet Take 50 mg by mouth daily.   Yes Historical Provider, MD  losartan (COZAAR) 50 MG tablet Take 50 mg by mouth daily.   Yes Historical Provider, MD  Multiple Vitamin (MULTIVITAMIN) tablet Take 1 tablet by mouth daily.   Yes Historical Provider, MD  ritonavir (NORVIR) 100 MG TABS tablet Take 100 mg by mouth daily with breakfast.   Yes Historical Provider, MD  sildenafil (VIAGRA) 100 MG tablet Take 100 mg by mouth daily as needed for erectile dysfunction.   Yes Historical Provider, MD    Allergies:  Allergies  Allergen Reactions  . Codeine Itching  . Eggs Or Egg-Derived Products     Shortness of breath  . Mercury     unknown    Past Medical History: Past Medical History  Diagnosis Date  . Hypertension   . Dialysis patient   . Renal disorder   . HIV disease   . Anemia   . Seizure   . Heart murmur     Past Surgical History  Procedure Laterality Date  . Av fistula placement Left 05/07/2013    Procedure: ARTERIOVENOUS (AV) FISTULA CREATION- LEFT;  Surgeon: Rosetta Posner, MD;  Location: [facility];  Service: Vascular;  Laterality: Left;  . Insertion of dialysis catheter      x 2  . Av fistula placement Left 08/12/2013    Procedure: ARTERIOVENOUS (AV) FISTULA CREATION  BRACHIOCEPHALIC;  Surgeon: Angelia Mould, MD;  Location: [facility] OR;  Service: Vascular;  Laterality: Left;  . Ligation of arteriovenous  fistula Left 08/12/2013    Procedure: LIGATION OF ARTERIOVENOUS  FISTULA RADIOCEPHALIC;  Surgeon: Angelia Mould, MD;  Location: [facility] OR;  Service: Vascular;  Laterality: Left;    Social History: he currently lives with his parents. He is employed. He denies smoking, alcohol use or illicit drug use. Usually independent with daily activities  Family History:  Family History  Problem Relation Age of Onset    . Kidney failure Mother   . Kidney disease Mother   . Kidney failure Maternal Uncle   . Kidney disease Paternal Uncle   . Kidney disease Maternal Grandfather      Review of Systems - History obtained from the patient General ROS: positive for  - fatigue Psychological ROS: negative Ophthalmic ROS: negative ENT ROS: negative Allergy and Immunology ROS: negative Hematological and Lymphatic ROS: negative Endocrine ROS: negative Respiratory ROS: as in hpi Cardiovascular ROS: as in hpi Gastrointestinal ROS: as in hpi Genito-Urinary ROS: no dysuria, trouble voiding, or hematuria Musculoskeletal ROS: negative Neurological ROS: no TIA or stroke symptoms Dermatological ROS: negative  Physical Examination  Filed Vitals:   03/27/14 1419 03/27/14 1615 03/27/14 1731 03/27/14 1913  BP: 149/102 134/94 121/95 141/99  Pulse: 120 115 115 122  Temp:      TempSrc:      Resp: $Remo'20 22 18 22  'BmDvO$ Height:      Weight:      SpO2: 99% 99% 100% 100%    BP 141/99  Pulse 122  Temp(Src) 99.5 F (37.5 C) (Oral)  Resp 22  Ht $R'5\' 7"'MY$  (1.702 m)  Wt 68.947 kg (152 lb)  BMI 23.80 kg/m2  SpO2 100%  General appearance: alert, cooperative, appears stated age and no distress Head: Normocephalic, without obvious abnormality, atraumatic Eyes: conjunctivae/corneas clear. PERRL, EOM's intact.  Throat: lips, mucosa, and tongue normal; teeth and gums normal Neck: no adenopathy, no carotid bruit, no JVD, supple, symmetrical, trachea midline and thyroid not enlarged, symmetric, no tenderness/mass/nodules Resp: few crackles at the bases. No wheezing or rhonchi. Cardio: S1-S2 is tachycardic. Regular. No S3, S4. No rubs, murmurs, or bruit. Minimal pedal edema. GI: soft, non-tender; bowel sounds normal; no masses,  no organomegaly Extremities: minimal pedal edema. No other abnormalities noted. Pulses: 2+ and symmetric Skin: Skin color, texture, turgor normal. No rashes or lesions Lymph nodes: Cervical,  supraclavicular, and axillary nodes normal. Neurologic: alert and oriented x3. No focal neurological deficits are noted.  Laboratory Data: Results for orders placed during the hospital encounter of 03/27/14 (from the past 48 hour(s))  CBC WITH DIFFERENTIAL     Status: Abnormal   Collection Time    03/27/14  2:35 PM      Result Value Ref Range   WBC 6.6  4.0 - 10.5 K/uL   RBC 3.58 (*) 4.22 - 5.81 MIL/uL   Hemoglobin 11.9 (*) 13.0 - 17.0 g/dL   HCT 35.3 (*) 39.0 - 52.0 %   MCV 98.6  78.0 - 100.0 fL   MCH 33.2  26.0 - 34.0 pg   MCHC 33.7  30.0 - 36.0 g/dL   RDW 16.8 (*) 11.5 - 15.5 %   Platelets 201  150 - 400 K/uL   Neutrophils Relative % 40 (*) 43 - 77 %   Neutro Abs 2.7  1.7 - 7.7 K/uL   Lymphocytes Relative 50 (*) 12 - 46 %  Lymphs Abs 3.3  0.7 - 4.0 K/uL   Monocytes Relative 7  3 - 12 %   Monocytes Absolute 0.4  0.1 - 1.0 K/uL   Eosinophils Relative 3  0 - 5 %   Eosinophils Absolute 0.2  0.0 - 0.7 K/uL   Basophils Relative 0  0 - 1 %   Basophils Absolute 0.0  0.0 - 0.1 K/uL  BASIC METABOLIC PANEL     Status: Abnormal   Collection Time    03/27/14  2:35 PM      Result Value Ref Range   Sodium 139  137 - 147 mEq/L   Potassium 6.1 (*) 3.7 - 5.3 mEq/L   Chloride 99  96 - 112 mEq/L   CO2 20  19 - 32 mEq/L   Glucose, Bld 87  70 - 99 mg/dL   BUN 65 (*) 6 - 23 mg/dL   Creatinine, Ser 12.19 (*) 0.50 - 1.35 mg/dL   Calcium 9.4  8.4 - 10.5 mg/dL   GFR calc non Af Amer 4 (*) >90 mL/min   GFR calc Af Amer 5 (*) >90 mL/min   Comment: (NOTE)     The eGFR has been calculated using the CKD EPI equation.     This calculation has not been validated in all clinical situations.     eGFR's persistently <90 mL/min signify possible Chronic Kidney     Disease.  TROPONIN I     Status: None   Collection Time    03/27/14  2:35 PM      Result Value Ref Range   Troponin I <0.30  <0.30 ng/mL   Comment:            Due to the release kinetics of cTnI,     a negative result within the first  hours     of the onset of symptoms does not rule out     myocardial infarction with certainty.     If myocardial infarction is still suspected,     repeat the test at appropriate intervals.  D-DIMER, QUANTITATIVE     Status: Abnormal   Collection Time    03/27/14  2:35 PM      Result Value Ref Range   D-Dimer, Quant 1.56 (*) 0.00 - 0.48 ug/mL-FEU   Comment:            AT THE INHOUSE ESTABLISHED CUTOFF     VALUE OF 0.48 ug/mL FEU,     THIS ASSAY HAS BEEN DOCUMENTED     IN THE LITERATURE TO HAVE     A SENSITIVITY AND NEGATIVE     PREDICTIVE VALUE OF AT LEAST     98 TO 99%.  THE TEST RESULT     SHOULD BE CORRELATED WITH     AN ASSESSMENT OF THE CLINICAL     PROBABILITY OF DVT / VTE.  PHOSPHORUS     Status: None   Collection Time    03/27/14  2:35 PM      Result Value Ref Range   Phosphorus 4.1  2.3 - 4.6 mg/dL  MAGNESIUM     Status: Abnormal   Collection Time    03/27/14  2:35 PM      Result Value Ref Range   Magnesium 2.8 (*) 1.5 - 2.5 mg/dL    Radiology Reports: Ct Angio Chest Pe W/cm &/or Wo Cm  03/27/2014   CLINICAL DATA:  Pleuritic chest pain,  EXAM: CT ANGIOGRAPHY CHEST WITH CONTRAST  TECHNIQUE: Multidetector CT  imaging of the chest was performed using the standard protocol during bolus administration of intravenous contrast. Multiplanar CT image reconstructions and MIPs were obtained to evaluate the vascular anatomy.  CONTRAST:  171mL OMNIPAQUE IOHEXOL 350 MG/ML SOLN  COMPARISON:  DG CHEST 2 VIEW dated 03/24/2014  FINDINGS: The thoracic inlet is unremarkable.  The heart is enlarged. Prominent lymph nodes measuring 1 cm or less within the prevascular space, and subcarinal regions. No mediastinal masses.  No filling defects within the main, lobar, or segmental pulmonary arteries. No thoracic aortic aneurysm nor dissection.  Small bilateral pleural effusions right greater than left.  Central airways are patent.  Diffuse ground-glass density throughout both lungs with mild  prominence of the interstitial markings with areas of scarring versus atelectasis within the lung bases.  No focal regions of consolidation nor focal infiltrates.  The visualized upper abdominal viscera are unremarkable.  No aggressive appearing osseous lesions.  Review of the MIP images confirms the above findings.  IMPRESSION: No CT evidence of pulmonary arterial embolic disease.  Interstitial infiltrate, mild, differential considerations include pulmonary edema, infectious or inflammatory etiologies.  Prominent mediastinal lymph nodes likely reactive  Cardiomegaly   Electronically Signed   By: Margaree Mackintosh M.D.   On: 03/27/2014 18:02    Electrocardiogram: sinus tachycardia at 118 beats per minute. Normal axis. Intervals are normal. No concerning Q waves. No concerning ST or T-wave changes are noted.  Problem List  Principal Problem:   Pulmonary edema Active Problems:   ESRD (end stage renal disease) on dialysis   HIV disease   Hyperkalemia   Normocytic anemia   HTN (hypertension)   Chest pain   Assessment: This is a 38 year old, African American male with HIV and ESRD who presents with shortness of breath, and was noted to have pulmonary edema. This is most likely due to his end-stage renal disease, and need for dialysis. Since he moved back to [facility] recently he has not been set up for regular outpatient dialysis. Chest pain is most likely due to pulmonary edema. He is at this time stable hemodynamically. Doesn't need emergent dialysis.  Plan: #1 pulmonary edema secondary to end-stage renal disease: He is doing well on oxygen, saturating greater than 95%. His work of breathing is very minimal. ED physician has contacted nephrology and they will plan to dialyze him in the morning. In the meantime, patient will be admitted to the nephrology floor on telemetry for now. Troponins will need to be cycled.  #2 chest pain with sinus tachycardia: Most likely due to pulmonary edema. EKG does  not show any ischemic changes. Troponin will be trended. Nitro paste will be utilized overnight. As and when he is dialyzed his chest pain should resolve.CT angiogram did not showed PE.  #3 diarrhea: Most likely food related. His abdomen is completely benign. We'll given him a dose of Imodium. If his diarrhea worsens or if he develops other symptoms further evaluation may be necessary, especially considering his history of HIV.  #4 end-stage renal disease on hemodialysis with hyperkalemia: he needs inpatient dialysis. Eventually he will need to be set up on regular outpatient schedule. This will be facilitated by nephrology. He has a fistula in the left arm, which appears to be functioning well. He's been given Kayexalate for elevated potassium per nephrology recommendations. EKG does not show any hyperkalemic changes.  #5 history of HIV disease: Last CD4 count was 390 in January of this year. He is on antiretroviral treatment, which will be continued.  #6  history of hypertension: Monitor blood pressure closely. Nitro paste tonight. Continue with his Coreg tonight. Resume his other blood pressure medications after dialysis.   DVT Prophylaxis: heparin Code Status: full code Family Communication: discussed with the patient  Disposition Plan: admit to telemetry.    Further management decisions will depend on results of further testing and patient's response to treatment.   Bonnielee Haff  [facility] Pager [phone]  If 7PM-7AM, please contact night-coverage [email or web address] Password [facility]  03/27/2014, 7:54 PM  Disclaimer: This note was dictated with voice recognition software. Similar sounding words can inadvertently be transcribed and may not be corrected upon review.

## 2014-03-27 NOTE — ED Notes (Signed)
Patient stated he was having difficulty breathing so placed him on 2L: per nurse ok

## 2014-03-27 NOTE — ED Notes (Signed)
Pt reports recently moving here and needing dialysis. Last treatment was Thursday. No complaints, no acute distress noted at triage.

## 2014-03-27 NOTE — ED Provider Notes (Signed)
CSN: [id number]     Arrival date & time 03/27/14  1235 History   First MD Initiated Contact with Patient 03/27/14 1349     No chief complaint on file.    (Consider location/radiation/quality/duration/timing/severity/associated sxs/prior Treatment) HPI Comments: Patient presents to the ER stating that he needs dialysis. Patient recently moved here from [place]. He was seen in the emergency department 4 days ago and emergent dialysis was performed. Patient was discharged and was to be contacted for followup for ongoing dialysis, has not heard anything yet. Patient reports that he feels like he is filling up with fluid. He has noticed progressively worsening shortness of breath. There is no cough or fever. Patient does report that he has a discomfort in the center of his chest that worsens when he breathes. He is having trouble catching his breath. He also thinks that his joints are abnormal because he has had a lot of itching which generally happens when his electrolytes are off.   Past Medical History  Diagnosis Date  . Hypertension   . Dialysis patient   . Renal disorder   . HIV disease   . Anemia   . Seizure   . Heart murmur    Past Surgical History  Procedure Laterality Date  . Av fistula placement Left 05/07/2013    Procedure: ARTERIOVENOUS (AV) FISTULA CREATION- LEFT;  Surgeon: Rosetta Posner, MD;  Location: [place];  Service: Vascular;  Laterality: Left;  . Insertion of dialysis catheter      x 2  . Av fistula placement Left 08/12/2013    Procedure: ARTERIOVENOUS (AV) FISTULA CREATION BRACHIOCEPHALIC;  Surgeon: Angelia Mould, MD;  Location: [place];  Service: Vascular;  Laterality: Left;  . Ligation of arteriovenous  fistula Left 08/12/2013    Procedure: LIGATION OF ARTERIOVENOUS  FISTULA RADIOCEPHALIC;  Surgeon: Angelia Mould, MD;  Location: [facility] OR;  Service: Vascular;  Laterality: Left;   Family History  Problem Relation Age of Onset  . Kidney failure Mother   .  Kidney disease Mother   . Kidney failure Maternal Uncle   . Kidney disease Paternal Uncle   . Kidney disease Maternal Grandfather    History  Substance Use Topics  . Smoking status: Never Smoker   . Smokeless tobacco: Never Used  . Alcohol Use: No    Review of Systems  Constitutional: Negative for fever.  Respiratory: Positive for shortness of breath.   Cardiovascular: Positive for chest pain.  Skin:       Pruritus  All other systems reviewed and are negative.     Allergies  Codeine; Eggs or egg-derived products; and Mercury  Home Medications   Prior to Admission medications   Medication Sig Start Date End Date Taking? Authorizing Provider  abacavir (ZIAGEN) 300 MG tablet Take 600 mg by mouth daily.    Historical Provider, MD  azithromycin (ZITHROMAX) 600 MG tablet Take 1,200 mg by mouth once a week. Take on monday    Historical Provider, MD  carvedilol (COREG) 6.25 MG tablet Take 6.25 mg by mouth 2 (two) times daily with a meal.    Historical Provider, MD  Darunavir Ethanolate (PREZISTA) 800 MG tablet Take 800 mg by mouth daily with breakfast.    Historical Provider, MD  lamivudine (EPIVIR) 100 MG tablet Take 50 mg by mouth daily.    Historical Provider, MD  losartan (COZAAR) 50 MG tablet Take 50 mg by mouth daily.    Historical Provider, MD  Multiple Vitamin (MULTIVITAMIN) tablet Take  1 tablet by mouth daily.    Historical Provider, MD  ritonavir (NORVIR) 100 MG TABS tablet Take 100 mg by mouth daily with breakfast.    Historical Provider, MD  sildenafil (VIAGRA) 100 MG tablet Take 100 mg by mouth daily as needed for erectile dysfunction.    Historical Provider, MD   BP 121/95  Pulse 115  Temp(Src) 99.5 F (37.5 C) (Oral)  Resp 18  Ht 5\' 7"  (1.702 m)  Wt 152 lb (68.947 kg)  BMI 23.80 kg/m2  SpO2 100% Physical Exam  Constitutional: He is oriented to person, place, and time. He appears well-developed and well-nourished. No distress.  HENT:  Head: Normocephalic and  atraumatic.  Right Ear: Hearing normal.  Left Ear: Hearing normal.  Nose: Nose normal.  Mouth/Throat: Oropharynx is clear and moist and mucous membranes are normal.  Eyes: Conjunctivae and EOM are normal. Pupils are equal, round, and reactive to light.  Neck: Normal range of motion. Neck supple.  Cardiovascular: Regular rhythm, S1 normal and S2 normal.  Tachycardia present.  Exam reveals no gallop and no friction rub.   No murmur heard. Pulmonary/Chest: Effort normal. No respiratory distress. He has decreased breath sounds. He exhibits no tenderness.  Abdominal: Soft. Normal appearance and bowel sounds are normal. There is no hepatosplenomegaly. There is no tenderness. There is no rebound, no guarding, no tenderness at McBurney's point and negative Murphy's sign. No hernia.  Musculoskeletal: Normal range of motion.  Neurological: He is alert and oriented to person, place, and time. He has normal strength. No cranial nerve deficit or sensory deficit. Coordination normal. GCS eye subscore is 4. GCS verbal subscore is 5. GCS motor subscore is 6.  Skin: Skin is warm, dry and intact. No rash noted. No cyanosis.  Psychiatric: He has a normal mood and affect. His speech is normal and behavior is normal. Thought content normal.    ED Course  Procedures (including critical care time) Labs Review Labs Reviewed  CBC WITH DIFFERENTIAL - Abnormal; Notable for the following:    RBC 3.58 (*)    Hemoglobin 11.9 (*)    HCT 35.3 (*)    RDW 16.8 (*)    Neutrophils Relative % 40 (*)    Lymphocytes Relative 50 (*)    All other components within normal limits  BASIC METABOLIC PANEL - Abnormal; Notable for the following:    Potassium 6.1 (*)    BUN 65 (*)    Creatinine, Ser 12.19 (*)    GFR calc non Af Amer 4 (*)    GFR calc Af Amer 5 (*)    All other components within normal limits  D-DIMER, QUANTITATIVE - Abnormal; Notable for the following:    D-Dimer, Quant 1.56 (*)    All other components within  normal limits  MAGNESIUM - Abnormal; Notable for the following:    Magnesium 2.8 (*)    All other components within normal limits  TROPONIN I  PHOSPHORUS    Imaging Review Ct Angio Chest Pe W/cm &/or Wo Cm  03/27/2014   CLINICAL DATA:  Pleuritic chest pain,  EXAM: CT ANGIOGRAPHY CHEST WITH CONTRAST  TECHNIQUE: Multidetector CT imaging of the chest was performed using the standard protocol during bolus administration of intravenous contrast. Multiplanar CT image reconstructions and MIPs were obtained to evaluate the vascular anatomy.  CONTRAST:  137mL OMNIPAQUE IOHEXOL 350 MG/ML SOLN  COMPARISON:  DG CHEST 2 VIEW dated 03/24/2014  FINDINGS: The thoracic inlet is unremarkable.  The heart is enlarged. Prominent  lymph nodes measuring 1 cm or less within the prevascular space, and subcarinal regions. No mediastinal masses.  No filling defects within the main, lobar, or segmental pulmonary arteries. No thoracic aortic aneurysm nor dissection.  Small bilateral pleural effusions right greater than left.  Central airways are patent.  Diffuse ground-glass density throughout both lungs with mild prominence of the interstitial markings with areas of scarring versus atelectasis within the lung bases.  No focal regions of consolidation nor focal infiltrates.  The visualized upper abdominal viscera are unremarkable.  No aggressive appearing osseous lesions.  Review of the MIP images confirms the above findings.  IMPRESSION: No CT evidence of pulmonary arterial embolic disease.  Interstitial infiltrate, mild, differential considerations include pulmonary edema, infectious or inflammatory etiologies.  Prominent mediastinal lymph nodes likely reactive  Cardiomegaly   Electronically Signed   By: Margaree Mackintosh M.D.   On: 03/27/2014 18:02     EKG Interpretation   Date/Time:  Sunday Mar 27 2014 14:04:33 EDT Ventricular Rate:  118 PR Interval:  95 QRS Duration: 78 QT Interval:  355 QTC Calculation: 497 R Axis:    67 Text Interpretation:  Sinus tachycardia LAE, consider biatrial enlargement  Anteroseptal infarct, old Borderline repolarization abnormality No  significant change since last tracing Confirmed by Laterrance Nauta  MD,  Kaley Jutras [id number]) on 03/27/2014 2:25:12 PM      MDM   Final diagnoses:  Pulmonary edema  Hyperkalemia  Hypertension  ESRD (end stage renal disease)    Patient presents to the ER for evaluation of chest pain and shortness of breath. Patient reports that he just moved back to the area from [place]. He flew in earlier in the week. He was seen in the ER 4 days ago and had dialysis, was discharged. He was told that he would be contacted with arrangements for ongoing dialysis, but has not heard anything. He has had progressive shortness of breath, pain in the center of his chest. He cannot catch his breath, feels like he cannot get enough air in. He thinks it feels like when he has had a lot of fluid in his lungs before. Because of the recent flight, however, PE was considered. He was tachycardic but not hypoxic. D-dimer was elevated and therefore patient had CT scan of chest to rule out PE. No PE was seen, patient has evidence of pulmonary edema.  Recent blood work reveals hyperkalemia without acidosis or other joint abnormalities. Case was discussed with Doctor Joelyn Oms, on call for nephrology. He recommended treatment with 30 mg of Kayexalate, no other acute interventions for the hyperkalemia. He recommends admission to the hospital. Patient will not require emergent dialysis tonight, will be dialyzed in the morning.      Orpah Greek, MD 03/27/14 [phone]

## 2014-03-27 NOTE — Progress Notes (Signed)
Pt orientation to unit, room and routine. Information packet given to patient.  Admission INP armband ID verified with patient and in place. Side rails in place, fall risk assessment complete with patient verbalizing understanding of risks associated with falls. Pt verbalizes an understanding of how to use the call bell and to call for help before getting out of bed.   Will cont to monitor and assist as needed.  Velora Mediate, RN 03/27/2014 9:26 PM

## 2014-03-28 DIAGNOSIS — R079 Chest pain, unspecified: Secondary | ICD-10-CM | POA: Diagnosis not present

## 2014-03-28 DIAGNOSIS — N186 End stage renal disease: Secondary | ICD-10-CM | POA: Diagnosis not present

## 2014-03-28 DIAGNOSIS — B20 Human immunodeficiency virus [HIV] disease: Secondary | ICD-10-CM | POA: Diagnosis not present

## 2014-03-28 DIAGNOSIS — J811 Chronic pulmonary edema: Secondary | ICD-10-CM | POA: Diagnosis not present

## 2014-03-28 LAB — COMPREHENSIVE METABOLIC PANEL
ALBUMIN: 3 g/dL — AB (ref 3.5–5.2)
ALT: 30 U/L (ref 0–53)
AST: 24 U/L (ref 0–37)
Alkaline Phosphatase: 98 U/L (ref 39–117)
BUN: 70 mg/dL — ABNORMAL HIGH (ref 6–23)
CALCIUM: 9 mg/dL (ref 8.4–10.5)
CO2: 18 mEq/L — ABNORMAL LOW (ref 19–32)
Chloride: 102 mEq/L (ref 96–112)
Creatinine, Ser: 12.92 mg/dL — ABNORMAL HIGH (ref 0.50–1.35)
GFR calc Af Amer: 5 mL/min — ABNORMAL LOW (ref >90)
GFR calc non Af Amer: 4 mL/min — ABNORMAL LOW (ref >90)
Glucose, Bld: 81 mg/dL (ref 70–99)
Potassium: 5.2 mEq/L (ref 3.7–5.3)
SODIUM: 140 meq/L (ref 137–147)
TOTAL PROTEIN: 7.2 g/dL (ref 6.0–8.3)
Total Bilirubin: 0.5 mg/dL (ref 0.3–1.2)

## 2014-03-28 LAB — CBC
HCT: 30.4 % — ABNORMAL LOW (ref 39.0–52.0)
HEMOGLOBIN: 9.9 g/dL — AB (ref 13.0–17.0)
MCH: 32.4 pg (ref 26.0–34.0)
MCHC: 32.6 g/dL (ref 30.0–36.0)
MCV: 99.3 fL (ref 78.0–100.0)
Platelets: 153 10*3/uL (ref 150–400)
RBC: 3.06 MIL/uL — ABNORMAL LOW (ref 4.22–5.81)
RDW: 16.5 % — ABNORMAL HIGH (ref 11.5–15.5)
WBC: 6.3 10*3/uL (ref 4.0–10.5)

## 2014-03-28 LAB — TROPONIN I: Troponin I: <0.3 ng/mL (ref <0.30)

## 2014-03-28 MED ORDER — ACETAMINOPHEN 650 MG RE SUPP: 650.0000 mg | Freq: Four times a day (QID) | RECTAL | Status: DC | PRN

## 2014-03-28 MED ORDER — DARBEPOETIN ALFA-POLYSORBATE 25 MCG/0.42ML IJ SOLN
25.0000 ug | INTRAMUSCULAR | Status: DC
  Administered 2014-03-28: 25 ug via INTRAVENOUS
  Filled 2014-03-28: qty 0.42

## 2014-03-28 MED ORDER — RENA-VITE PO TABS
1.0000 | ORAL_TABLET | Freq: Every day | ORAL | Status: DC
  Filled 2014-03-28: qty 1

## 2014-03-28 MED ORDER — DARBEPOETIN ALFA-POLYSORBATE 25 MCG/0.42ML IJ SOLN
INTRAMUSCULAR | Status: AC
  Filled 2014-03-28: qty 0.42

## 2014-03-28 MED ORDER — ALTEPLASE 2 MG IJ SOLR
2.0000 mg | Freq: Once | INTRAMUSCULAR | Status: DC | PRN
  Filled 2014-03-28: qty 2

## 2014-03-28 MED ORDER — HYDROXYZINE HCL 25 MG PO TABS
25.0000 mg | ORAL_TABLET | Freq: Three times a day (TID) | ORAL | Status: DC | PRN
  Administered 2014-03-28 (×2): 25 mg via ORAL

## 2014-03-28 MED ORDER — ZOLPIDEM TARTRATE 5 MG PO TABS: 5.0000 mg | ORAL_TABLET | Freq: Every evening | ORAL | Status: DC | PRN

## 2014-03-28 MED ORDER — ACETAMINOPHEN 325 MG PO TABS: 650.0000 mg | ORAL_TABLET | Freq: Four times a day (QID) | ORAL | Status: DC | PRN

## 2014-03-28 MED ORDER — CALCIUM CARBONATE 1250 MG/5ML PO SUSP: 500.0000 mg | Freq: Four times a day (QID) | ORAL | Status: DC | PRN

## 2014-03-28 MED ORDER — SORBITOL 70 % SOLN: 30.0000 mL | Status: DC | PRN

## 2014-03-28 MED ORDER — CAMPHOR-MENTHOL 0.5-0.5 % EX LOTN: 1.0000 "application " | TOPICAL_LOTION | Freq: Three times a day (TID) | CUTANEOUS | Status: DC | PRN

## 2014-03-28 MED ORDER — HYDRALAZINE HCL 20 MG/ML IJ SOLN
5.0000 mg | Freq: Once | INTRAMUSCULAR | Status: AC
  Administered 2014-03-28: 5 mg via INTRAVENOUS

## 2014-03-28 MED ORDER — ONDANSETRON HCL 4 MG PO TABS: 4.0000 mg | ORAL_TABLET | Freq: Four times a day (QID) | ORAL | Status: DC | PRN

## 2014-03-28 MED ORDER — NEPRO/CARBSTEADY PO LIQD
237.0000 mL | ORAL | Status: DC | PRN
  Filled 2014-03-28: qty 237

## 2014-03-28 MED ORDER — NEPRO/CARBSTEADY PO LIQD: 237.0000 mL | Freq: Three times a day (TID) | ORAL | Status: DC | PRN

## 2014-03-28 MED ORDER — HYDROXYZINE HCL 25 MG PO TABS
ORAL_TABLET | ORAL | Status: AC
  Administered 2014-03-28: 25 mg via ORAL
  Filled 2014-03-28: qty 1

## 2014-03-28 MED ORDER — DOCUSATE SODIUM 283 MG RE ENEM: 1.0000 | ENEMA | RECTAL | Status: DC | PRN

## 2014-03-28 MED ORDER — HEPARIN SODIUM (PORCINE) 1000 UNIT/ML DIALYSIS
20.0000 [IU]/kg | Freq: Once | INTRAMUSCULAR | Status: AC
  Administered 2014-03-28: 1500 [IU] via INTRAVENOUS_CENTRAL

## 2014-03-28 MED ORDER — LIDOCAINE-PRILOCAINE 2.5-2.5 % EX CREA
1.0000 "application " | TOPICAL_CREAM | CUTANEOUS | Status: DC | PRN
  Filled 2014-03-28: qty 5

## 2014-03-28 MED ORDER — SODIUM CHLORIDE 0.9 % IV SOLN: 100.0000 mL | INTRAVENOUS | Status: DC | PRN

## 2014-03-28 MED ORDER — ONDANSETRON HCL 4 MG/2ML IJ SOLN: 4.0000 mg | Freq: Four times a day (QID) | INTRAMUSCULAR | Status: DC | PRN

## 2014-03-28 MED ORDER — HEPARIN SODIUM (PORCINE) 1000 UNIT/ML DIALYSIS: 1000.0000 [IU] | INTRAMUSCULAR | Status: DC | PRN

## 2014-03-28 MED ORDER — PENTAFLUOROPROP-TETRAFLUOROETH EX AERO: 1.0000 "application " | INHALATION_SPRAY | CUTANEOUS | Status: DC | PRN

## 2014-03-28 MED ORDER — LIDOCAINE HCL (PF) 1 % IJ SOLN: 5.0000 mL | INTRAMUSCULAR | Status: DC | PRN

## 2014-03-28 NOTE — Care Management Note (Addendum)
CARE MANAGEMENT NOTE 03/28/2014  Patient:  Dylan Barber, Dylan Barber   Account Number:  [id number]  Date Initiated:  03/28/2014  Documentation initiated by:  Lizabeth Leyden  Subjective/Objective Assessment:   admitted after missing hemodialysis     Action/Plan:   Anticipated DC Date:  03/28/2014   Anticipated DC Plan:  HOME/SELF CARE         Choice offered to / List presented to:             Status of service:  Completed, signed off Medicare Important Message given?   (If response is "NO", the following Medicare IM given date fields will be blank) Date Medicare IM given:   Date Additional Medicare IM given:    Discharge Disposition:  HOME/SELF CARE  Per UR Regulation:    If discussed at Long Length of Stay Meetings, dates discussed:    Comments:  03/28/2014   [place], [place] CLIP in process Addem 03/28/14  @ 1500 pt has been CLIPed and outpatient HD has been arranged @ [facility] [address] RN MPH, case manager, [phone]

## 2014-03-28 NOTE — Discharge Summary (Signed)
Physician Discharge Summary  Dylan Barber. [id number] DOB: 07-01-1976 DOA: 03/27/2014  PCP: Placido Sou, MD  Admit date: 03/27/2014 Discharge date: 03/28/2014  Recommendations for Outpatient Follow-up:  1. Pt will need to follow up with PCP in 2 weeks post discharge 2. Please obtain BMP to evaluate electrolytes and kidney function 3. Please also check CBC to evaluate Hg and Hct levels  Discharge Diagnoses:  Pulmonary edema  -Secondary to ESRD and missing dialysis  -No respiratory distress presently  -Wean off oxygen  -He was dialyzed on 03/28/2014--he was weaned off of oxygen Atypical chest pain  -CT angiogram chest negative for pulmonary embolus  -Troponins negative x3  -EKG with nonspecific ST-T wave changes  -The patient's tachycardia improved after dialysis -The patient also has been missing some of his doses of carvedilol Diarrhea  -Compounded by Kayexalate  -May be related to the patient's antiretrovirals  -12/07/2013 CD4 count 390  -Stool C. difficile PCR--the patient's diarrhea improved and this was not collected prior to his discharge  ESRD  -Patient is getting dialysis today  -Try to coordinate outpatient dialysis center  -states that he will be in [place] permanently  -Outpatient dialysis was set up at [facility]--his next scheduled dialysis will be 03/30/2014--patient should show up at 10:30 AM--this was communicated with the patient and he expressed understanding HIV  -12/07/2013 HIV RNA--61  -CD4 count 390/20%  -Continue present antiretroviral therapy  -Unclear why the patient remains on azithromycin MAI prophylaxis  -Repeat CD4 count--if greater than 50, discontinue azithromycin --this is pending at the time of discharge -He needs to followup with Dr. Linus Salmons Hypertension  -Continue carvedilol  Family Communication: Pt at beside  Disposition Plan: Home when medically stable   Discharge Condition:  stable  Disposition: home  Diet:renal with 1.2 L fluid restrict Wt Readings from Last 3 Encounters:  03/28/14 68 kg (149 lb 14.6 oz)  03/24/14 67.7 kg (149 lb 4 oz)  12/14/13 62.596 kg (138 lb)    History of present illness:  38 y.o. male. who presented to the ED for SOB yesterday afternoon. He has been on HD since 2013, ESRD secondary to HTN; last HD was 5/14. He just moved back to to [place] last week to live with his parents. History of HTN and HIV. He reports he was feeling well except for SOB which started on Saturday night but got progressively worse and he began experiencing chest pressure with breathing so he came to the ED because he needed dialysis. He presented to the emergency department on Thursday, and had to be admitted to the nephrology service for dialysis. He was subsequently discharged home. He was told that he'll be set up for outpatient dialysis but he never heard back. And, then yesterday afternoon he started feeling short of breath. He felt like he had gained weight. He also noticed leg swelling. He started getting pressure-like sensation in the left side of his chest. It would increase with deep breathing. His chest discomfort improved after dialysis. Although the patient claims that he was never contacted previously but his dialysis, he was indeed contacted as confirmed by the nephrology service. His story changed, and he then stated that he was not able to get transportation to his previous dialysis center. Case management help assisted to arrange outpatient dialysis chair at [facility] After this was confirmed, the patient was discharged in stable condition.      Consultants: nephrology Discharge Exam: Filed Vitals:   03/28/14 [id number]  BP: 137/82  Pulse: 107  Temp: 98.1 F (36.7 C)  Resp: 18   Filed Vitals:   03/28/14 1200 03/28/14 1230 03/28/14 1300 03/28/14 1347  BP: 151/97 161/89 151/97 137/82  Pulse: 108 113 114 107  Temp:    98.1  F (36.7 C)  TempSrc:    Oral  Resp:    18  Height:      Weight:    68 kg (149 lb 14.6 oz)  SpO2:    99%   General: A&O x 3, NAD, pleasant, cooperative Cardiovascular: RRR, no rub, no gallop, no S3 Respiratory: CTAB, no wheeze, no rhonchi Abdomen:soft, nontender, nondistended, positive bowel sounds Extremities: No edema, No lymphangitis, no petechiae  Discharge Instructions      Discharge Instructions   Increase activity slowly    Complete by:  As directed             Medication List         abacavir 300 MG tablet  Commonly known as:  ZIAGEN  Take 600 mg by mouth daily.     azithromycin 600 MG tablet  Commonly known as:  ZITHROMAX  Take 1,200 mg by mouth once a week. Mondays     carvedilol 6.25 MG tablet  Commonly known as:  COREG  Take 6.25 mg by mouth 2 (two) times daily with a meal.     lamivudine 100 MG tablet  Commonly known as:  EPIVIR  Take 50 mg by mouth daily.     losartan 50 MG tablet  Commonly known as:  COZAAR  Take 50 mg by mouth daily.     multivitamin tablet  Take 1 tablet by mouth daily.     PREZISTA 800 MG tablet  Generic drug:  Darunavir Ethanolate  Take 800 mg by mouth daily with breakfast.     ritonavir 100 MG Tabs tablet  Commonly known as:  NORVIR  Take 100 mg by mouth daily with breakfast.     sildenafil 100 MG tablet  Commonly known as:  VIAGRA  Take 100 mg by mouth daily as needed for erectile dysfunction.         The results of significant diagnostics from this hospitalization (including imaging, microbiology, ancillary and laboratory) are listed below for reference.    Significant Diagnostic Studies: Dg Chest 2 View  03/24/2014   CLINICAL DATA:  pain  EXAM: CHEST  2 VIEW  COMPARISON:  DG CHEST 1V PORT dated 12/20/2013  FINDINGS: Low lung volumes. Cardiac silhouette is enlarged. There is diffuse thickening of interstitial markings and mild peribronchial cuffing. Linear areas of increased density left lung base. Stable  calcified granuloma right lung. No acute osseous abnormalities.  IMPRESSION: Mild pulmonary edema.  Scarring versus atelectasis left lung base.   Electronically Signed   By: Margaree Mackintosh M.D.   On: 03/24/2014 12:51   Ct Angio Chest Pe W/cm &/or Wo Cm  03/27/2014   CLINICAL DATA:  Pleuritic chest pain,  EXAM: CT ANGIOGRAPHY CHEST WITH CONTRAST  TECHNIQUE: Multidetector CT imaging of the chest was performed using the standard protocol during bolus administration of intravenous contrast. Multiplanar CT image reconstructions and MIPs were obtained to evaluate the vascular anatomy.  CONTRAST:  133mL OMNIPAQUE IOHEXOL 350 MG/ML SOLN  COMPARISON:  DG CHEST 2 VIEW dated 03/24/2014  FINDINGS: The thoracic inlet is unremarkable.  The heart is enlarged. Prominent lymph nodes measuring 1 cm or less within the prevascular space, and subcarinal regions. No mediastinal masses.  No filling defects  within the main, lobar, or segmental pulmonary arteries. No thoracic aortic aneurysm nor dissection.  Small bilateral pleural effusions right greater than left.  Central airways are patent.  Diffuse ground-glass density throughout both lungs with mild prominence of the interstitial markings with areas of scarring versus atelectasis within the lung bases.  No focal regions of consolidation nor focal infiltrates.  The visualized upper abdominal viscera are unremarkable.  No aggressive appearing osseous lesions.  Review of the MIP images confirms the above findings.  IMPRESSION: No CT evidence of pulmonary arterial embolic disease.  Interstitial infiltrate, mild, differential considerations include pulmonary edema, infectious or inflammatory etiologies.  Prominent mediastinal lymph nodes likely reactive  Cardiomegaly   Electronically Signed   By: Margaree Mackintosh M.D.   On: 03/27/2014 18:02     Microbiology: No results found for this or any previous visit (from the past 240 hour(s)).   Labs: Basic Metabolic Panel:  Recent  Labs Lab 03/24/14 1223 03/27/14 1435 03/28/14 0636  NA 138 139 140  K 5.4* 6.1* 5.2  CL 106 99 102  CO2 16* 20 18*  GLUCOSE 78 87 81  BUN 57* 65* 70*  CREATININE 11.80* 12.19* 12.92*  CALCIUM 9.0 9.4 9.0  MG  --  2.8*  --   PHOS  --  4.1  --    Liver Function Tests:  Recent Labs Lab 03/28/14 0636  AST 24  ALT 30  ALKPHOS 98  BILITOT 0.5  PROT 7.2  ALBUMIN 3.0*   No results found for this basename: LIPASE, AMYLASE,  in the last 168 hours No results found for this basename: AMMONIA,  in the last 168 hours CBC:  Recent Labs Lab 03/24/14 1223 03/27/14 1435 03/28/14 0636  WBC 6.7 6.6 6.3  NEUTROABS  --  2.7  --   HGB 11.8* 11.9* 9.9*  HCT 36.4* 35.3* 30.4*  MCV 98.9 98.6 99.3  PLT 180 201 153   Cardiac Enzymes:  Recent Labs Lab 03/27/14 1435 03/27/14 2132 03/28/14 0636 03/28/14 0911  TROPONINI <0.30 <0.30 <0.30 <0.30   BNP: No components found with this basename: POCBNP,  CBG: No results found for this basename: GLUCAP,  in the last 168 hours  Time coordinating discharge:  Greater than 30 minutes  Signed:  Orson Eva, DO [facility] Pager: [phone] 03/28/2014, 3:40 PM

## 2014-03-28 NOTE — Consult Note (Signed)
Indication for Consultation:  Management of ESRD/hemodialysis; anemia, hypertension/volume and secondary hyperparathyroidism  HPI: Dylan Barber. is a 38 y.o. male. who presented to the ED for SOB yesterday afternoon. He has been on HD since 2013,  ESRD secondary to HTN; last HD was 5/14. He just moved back to to [place] last week to live with his parents. History of HTN and HIV.  He reports he was feeling well except for SOB which started on Saturday night but got progressively worse and he began experiencing chest pressure with breathing so he came to the ED because he needed dialysis. Will schedule HD today and CLIP for outpt placement. We actually had it all arranged - he had a chair for [facility] this AM but now states that he absolutely cannot go there because his parents could not drive him to [facility] for even one treatment   Past Medical History  Diagnosis Date  . Hypertension   . Dialysis patient   . Renal disorder   . HIV disease   . Anemia   . Seizure   . Heart murmur    Past Surgical History  Procedure Laterality Date  . Av fistula placement Left 05/07/2013    Procedure: ARTERIOVENOUS (AV) FISTULA CREATION- LEFT;  Surgeon: Rosetta Posner, MD;  Location: [facility];  Service: Vascular;  Laterality: Left;  . Insertion of dialysis catheter      x 2  . Av fistula placement Left 08/12/2013    Procedure: ARTERIOVENOUS (AV) FISTULA CREATION BRACHIOCEPHALIC;  Surgeon: Angelia Mould, MD;  Location: [facility];  Service: Vascular;  Laterality: Left;  . Ligation of arteriovenous  fistula Left 08/12/2013    Procedure: LIGATION OF ARTERIOVENOUS  FISTULA RADIOCEPHALIC;  Surgeon: Angelia Mould, MD;  Location: [facility];  Service: Vascular;  Laterality: Left;   Family History  Problem Relation Age of Onset  . Kidney failure Mother   . Kidney disease Mother   . Kidney failure Maternal Uncle   . Kidney disease Paternal Uncle   . Kidney disease Maternal Grandfather    Social History:   reports that he has never smoked. He has never used smokeless tobacco. He reports that he does not drink alcohol or use illicit drugs. Allergies  Allergen Reactions  . Codeine Itching  . Eggs Or Egg-Derived Products     Shortness of breath  . Mercury     unknown   Prior to Admission medications   Medication Sig Start Date End Date Taking? Authorizing Provider  abacavir (ZIAGEN) 300 MG tablet Take 600 mg by mouth daily.   Yes Historical Provider, MD  azithromycin (ZITHROMAX) 600 MG tablet Take 1,200 mg by mouth once a week. Mondays   Yes Historical Provider, MD  carvedilol (COREG) 6.25 MG tablet Take 6.25 mg by mouth 2 (two) times daily with a meal.   Yes Historical Provider, MD  Darunavir Ethanolate (PREZISTA) 800 MG tablet Take 800 mg by mouth daily with breakfast.   Yes Historical Provider, MD  lamivudine (EPIVIR) 100 MG tablet Take 50 mg by mouth daily.   Yes Historical Provider, MD  losartan (COZAAR) 50 MG tablet Take 50 mg by mouth daily.   Yes Historical Provider, MD  Multiple Vitamin (MULTIVITAMIN) tablet Take 1 tablet by mouth daily.   Yes Historical Provider, MD  ritonavir (NORVIR) 100 MG TABS tablet Take 100 mg by mouth daily with breakfast.   Yes Historical Provider, MD  sildenafil (VIAGRA) 100 MG tablet Take 100 mg by mouth daily  as needed for erectile dysfunction.   Yes Historical Provider, MD   Current Facility-Administered Medications  Medication Dose Route Frequency Provider Last Rate Last Dose  . 0.9 %  sodium chloride infusion  250 mL Intravenous PRN Bonnielee Haff, MD      . abacavir (ZIAGEN) tablet 600 mg  600 mg Oral Daily Bonnielee Haff, MD   600 mg at 03/27/14 2255  . acetaminophen (TYLENOL) tablet 650 mg  650 mg Oral Q6H PRN Bonnielee Haff, MD   650 mg at 03/28/14 0316   Or  . acetaminophen (TYLENOL) suppository 650 mg  650 mg Rectal Q6H PRN Bonnielee Haff, MD      . albuterol (PROVENTIL) (2.5 MG/3ML) 0.083% nebulizer solution 2.5 mg  2.5 mg Nebulization Q2H PRN  Bonnielee Haff, MD      . aspirin EC tablet 81 mg  81 mg Oral Daily Bonnielee Haff, MD      . azithromycin (ZITHROMAX) tablet 1,200 mg  1,200 mg Oral Weekly Bonnielee Haff, MD      . carvedilol (COREG) tablet 6.25 mg  6.25 mg Oral BID WC Bonnielee Haff, MD   6.25 mg at 03/27/14 2255  . Darunavir Ethanolate (PREZISTA) tablet 800 mg  800 mg Oral Q breakfast Bonnielee Haff, MD      . heparin injection 5,000 Units  5,000 Units Subcutaneous 3 times per day Bonnielee Haff, MD   5,000 Units at 03/28/14 0544  . hydrALAZINE (APRESOLINE) injection 5 mg  5 mg Intravenous Once Jeryl Columbia, NP      . lamiVUDine (EPIVIR) 10 MG/ML solution 50 mg  50 mg Oral Daily Bonnielee Haff, MD   50 mg at 03/27/14 2255  . loperamide (IMODIUM) capsule 4 mg  4 mg Oral Once Bonnielee Haff, MD      . morphine 2 MG/ML injection 1 mg  1 mg Intravenous Q4H PRN Bonnielee Haff, MD      . ondansetron (ZOFRAN) tablet 4 mg  4 mg Oral Q6H PRN Bonnielee Haff, MD       Or  . ondansetron (ZOFRAN) injection 4 mg  4 mg Intravenous Q6H PRN Bonnielee Haff, MD      . ritonavir (NORVIR) tablet 100 mg  100 mg Oral Q breakfast Bonnielee Haff, MD      . sodium chloride 0.9 % injection 3 mL  3 mL Intravenous Q12H Bonnielee Haff, MD      . sodium chloride 0.9 % injection 3 mL  3 mL Intravenous Q12H Bonnielee Haff, MD   3 mL at 03/27/14 2258  . sodium chloride 0.9 % injection 3 mL  3 mL Intravenous PRN Bonnielee Haff, MD       Labs: Basic Metabolic Panel:  Recent Labs Lab 03/24/14 1223 03/27/14 1435 03/28/14 0636  NA 138 139 140  K 5.4* 6.1* 5.2  CL 106 99 102  CO2 16* 20 18*  GLUCOSE 78 87 81  BUN 57* 65* 70*  CREATININE 11.80* 12.19* 12.92*  CALCIUM 9.0 9.4 9.0  PHOS  --  4.1  --    Liver Function Tests:  Recent Labs Lab 03/28/14 0636  AST 24  ALT 30  ALKPHOS 98  BILITOT 0.5  PROT 7.2  ALBUMIN 3.0*   No results found for this basename: LIPASE, AMYLASE,  in the last 168 hours No results found for this basename:  AMMONIA,  in the last 168 hours CBC:  Recent Labs Lab 03/24/14 1223 03/27/14 1435 03/28/14 0636  WBC 6.7 6.6 6.3  NEUTROABS  --  2.7  --   HGB 11.8* 11.9* 9.9*  HCT 36.4* 35.3* 30.4*  MCV 98.9 98.6 99.3  PLT 180 201 153   Cardiac Enzymes:  Recent Labs Lab 03/27/14 1435 03/27/14 2132 03/28/14 0636  TROPONINI <0.30 <0.30 <0.30   CBG: No results found for this basename: GLUCAP,  in the last 168 hours Iron Studies: No results found for this basename: IRON, TIBC, TRANSFERRIN, FERRITIN,  in the last 72 hours Studies/Results: Ct Angio Chest Pe W/cm &/or Wo Cm  03/27/2014   CLINICAL DATA:  Pleuritic chest pain,  EXAM: CT ANGIOGRAPHY CHEST WITH CONTRAST  TECHNIQUE: Multidetector CT imaging of the chest was performed using the standard protocol during bolus administration of intravenous contrast. Multiplanar CT image reconstructions and MIPs were obtained to evaluate the vascular anatomy.  CONTRAST:  147mL OMNIPAQUE IOHEXOL 350 MG/ML SOLN  COMPARISON:  DG CHEST 2 VIEW dated 03/24/2014  FINDINGS: The thoracic inlet is unremarkable.  The heart is enlarged. Prominent lymph nodes measuring 1 cm or less within the prevascular space, and subcarinal regions. No mediastinal masses.  No filling defects within the main, lobar, or segmental pulmonary arteries. No thoracic aortic aneurysm nor dissection.  Small bilateral pleural effusions right greater than left.  Central airways are patent.  Diffuse ground-glass density throughout both lungs with mild prominence of the interstitial markings with areas of scarring versus atelectasis within the lung bases.  No focal regions of consolidation nor focal infiltrates.  The visualized upper abdominal viscera are unremarkable.  No aggressive appearing osseous lesions.  Review of the MIP images confirms the above findings.  IMPRESSION: No CT evidence of pulmonary arterial embolic disease.  Interstitial infiltrate, mild, differential considerations include pulmonary  edema, infectious or inflammatory etiologies.  Prominent mediastinal lymph nodes likely reactive  Cardiomegaly   Electronically Signed   By: Margaree Mackintosh M.D.   On: 03/27/2014 18:02     Review of Systems: Negative except for SOB and chest pressure and swelling in arms  Physical Exam: Filed Vitals:   03/27/14 1930 03/27/14 2120 03/27/14 2300 03/28/14 0615  BP: 140/99 146/107 137/90 150/103  Pulse: 117 122  105  Temp:  98.1 F (36.7 C)  97.7 F (36.5 C)  TempSrc:  Oral  Oral  Resp: 23 20  19   Height:  5\' 7"  (1.702 m)    Weight:  73.8 kg (162 lb 11.2 oz)    SpO2: 100% 100%  94%     General: Well developed, well nourished, in no acute distress. Head: Normocephalic, atraumatic, sclera non-icteric, mucus membranes are moist Neck: Supple. JVD not elevated. Lungs: Clear bilaterally to auscultation without wheezes, rales, or rhonchi. Breathing is unlabored. Heart: RRR with S1 S2. No murmurs, rubs, or gallops appreciated. Abdomen: Soft, non-tender, non-distended with normoactive bowel sounds. No rebound/guarding. No obvious abdominal masses. M-S:  Strength and tone appear normal for age. Lower extremities: trace LE edema Neuro: Alert and oriented X 3. Moves all extremities spontaneously. Psych:  Responds to questions appropriately with a normal affect. Dialysis Access:LUA AVF + bruit/thrill   Dialysis Orders:  4hr 64.5kg  Assessment/Plan: 1.  SOB- secondary to missed HD. HD pending today. He absolutely could be discharged today because he has a spot at a unit but is refusing to go there- hopefully we will hear from [place] today that he can go there and be discharged. He is not starting out on a cooperative foot for [place] here 2. Chest pain- likely related to fluid overload, troponin negative. CT angio- no  PE 3.  ESRD -  CLIP for outpt chair, had been placed back at [facility] but refusing to go, [facility]  Hypertension/volume  - 150/103, home meds of losartan and carvedilol. 9kgs  over EDW.  4.  Anemia  - hgb 9.9, start Aranesp 25 q Monday 5.  Metabolic bone disease -  Ca+ 9, phos 4.1 6.  Nutrition - alb 3, renal diet. multivit  Shelle Iron, NP [employer] [phone] 03/28/2014, 8:44 AM   Patient seen and examined, agree with above note with above modifications. Hopefully will hear today from [place] so that he can go- a huge waste of resources to keep him inpatient at this time Corliss Parish, MD 03/28/2014

## 2014-03-28 NOTE — Progress Notes (Signed)
03/28/2014 2:45 PM Hemodialysis Outpatient Note; this patient has been accepted at the [facility] on a Monday, Wednesday and Friday 2nd shift schedule.The center can begin treatment on Wednesday Mar 30, 2014 at 10:30 AM. Thank you. Gordy Savers

## 2014-03-28 NOTE — Progress Notes (Signed)
PROGRESS NOTE  Natalie P Erin Hearing. UW:[id number] DOB: Oct 15, 1976 DOA: 03/27/2014 PCP: Placido Sou, MD  Assessment/Plan: Pulmonary edema -Secondary to ESRD and missing dialysis -No respiratory distress presently -Wean off oxygen Atypical chest pain -CT angiogram chest negative for pulmonary embolus -Troponins negative x2 -EKG with nonspecific ST-T wave changes Diarrhea -Compounded by Kayexalate -May be related to the patient's antiretrovirals -12/07/2013 CD4 count 390 -Stool C. difficile PCR ESRD -Patient is getting dialysis today -Try to coordinate outpatient dialysis center -She states that he will be in [place] permanently HIV -12/07/2013 HIV RNA--61 -CD4 count 390/20% -Continue present antiretroviral therapy -Unclear why the patient remains on azithromycin MAI prophylaxis -Repeat CD4 count--if greater than 50, discontinue azithromycin Hypertension -Continue carvedilol  Family Communication:   Pt at beside Disposition Plan:   Home when medically stable       Procedures/Studies: Dg Chest 2 View  03/24/2014   CLINICAL DATA:  pain  EXAM: CHEST  2 VIEW  COMPARISON:  DG CHEST 1V PORT dated 12/20/2013  FINDINGS: Low lung volumes. Cardiac silhouette is enlarged. There is diffuse thickening of interstitial markings and mild peribronchial cuffing. Linear areas of increased density left lung base. Stable calcified granuloma right lung. No acute osseous abnormalities.  IMPRESSION: Mild pulmonary edema.  Scarring versus atelectasis left lung base.   Electronically Signed   By: Margaree Mackintosh M.D.   On: 03/24/2014 12:51   Ct Angio Chest Pe W/cm &/or Wo Cm  03/27/2014   CLINICAL DATA:  Pleuritic chest pain,  EXAM: CT ANGIOGRAPHY CHEST WITH CONTRAST  TECHNIQUE: Multidetector CT imaging of the chest was performed using the standard protocol during bolus administration of intravenous contrast. Multiplanar CT image reconstructions and MIPs were obtained to evaluate  the vascular anatomy.  CONTRAST:  193mL OMNIPAQUE IOHEXOL 350 MG/ML SOLN  COMPARISON:  DG CHEST 2 VIEW dated 03/24/2014  FINDINGS: The thoracic inlet is unremarkable.  The heart is enlarged. Prominent lymph nodes measuring 1 cm or less within the prevascular space, and subcarinal regions. No mediastinal masses.  No filling defects within the main, lobar, or segmental pulmonary arteries. No thoracic aortic aneurysm nor dissection.  Small bilateral pleural effusions right greater than left.  Central airways are patent.  Diffuse ground-glass density throughout both lungs with mild prominence of the interstitial markings with areas of scarring versus atelectasis within the lung bases.  No focal regions of consolidation nor focal infiltrates.  The visualized upper abdominal viscera are unremarkable.  No aggressive appearing osseous lesions.  Review of the MIP images confirms the above findings.  IMPRESSION: No CT evidence of pulmonary arterial embolic disease.  Interstitial infiltrate, mild, differential considerations include pulmonary edema, infectious or inflammatory etiologies.  Prominent mediastinal lymph nodes likely reactive  Cardiomegaly   Electronically Signed   By: Margaree Mackintosh M.D.   On: 03/27/2014 18:02         Subjective: Patient complains of loose since 03/26/2014. Denies hematochezia, melena, abdominal pain. There's no fevers or chills. Patient denies any recent sick contacts.  Objective: Filed Vitals:   03/27/14 2300 03/28/14 0615 03/28/14 0917 03/28/14 0924  BP: 137/90 150/103 152/98 156/100  Pulse:  105 108 104  Temp:  97.7 F (36.5 C) 97.5 F (36.4 C) 97.9 F (36.6 C)  TempSrc:  Oral Oral Oral  Resp:  19 18 19   Height:      Weight:    73.9 kg (162 lb 14.7 oz)  SpO2:  94% 99% 99%  Intake/Output Summary (Last 24 hours) at 03/28/14 0957 Last data filed at 03/28/14 [id number]  Gross per 24 hour  Intake    203 ml  Output      0 ml  Net    203 ml   Weight change:   Exam:   General:  Pt is alert, follows commands appropriately, not in acute distress  HEENT: No icterus, No thrush, [place]/AT  Cardiovascular: RRR, S1/S2, no rubs, no gallops  Respiratory: Bibasilar crackles. No wheezing. Good air movement.  Abdomen: Soft/+BS, non tender, non distended, no guarding  Extremities: No edema, No lymphangitis, No petechiae, No rashes, no synovitis  Data Reviewed: Basic Metabolic Panel:  Recent Labs Lab 03/24/14 1223 03/27/14 1435 03/28/14 0636  NA 138 139 140  K 5.4* 6.1* 5.2  CL 106 99 102  CO2 16* 20 18*  GLUCOSE 78 87 81  BUN 57* 65* 70*  CREATININE 11.80* 12.19* 12.92*  CALCIUM 9.0 9.4 9.0  MG  --  2.8*  --   PHOS  --  4.1  --    Liver Function Tests:  Recent Labs Lab 03/28/14 0636  AST 24  ALT 30  ALKPHOS 98  BILITOT 0.5  PROT 7.2  ALBUMIN 3.0*   No results found for this basename: LIPASE, AMYLASE,  in the last 168 hours No results found for this basename: AMMONIA,  in the last 168 hours CBC:  Recent Labs Lab 03/24/14 1223 03/27/14 1435 03/28/14 0636  WBC 6.7 6.6 6.3  NEUTROABS  --  2.7  --   HGB 11.8* 11.9* 9.9*  HCT 36.4* 35.3* 30.4*  MCV 98.9 98.6 99.3  PLT 180 201 153   Cardiac Enzymes:  Recent Labs Lab 03/27/14 1435 03/27/14 2132 03/28/14 0636  TROPONINI <0.30 <0.30 <0.30   BNP: No components found with this basename: POCBNP,  CBG: No results found for this basename: GLUCAP,  in the last 168 hours  No results found for this or any previous visit (from the past 240 hour(s)).   Scheduled Meds: . abacavir  600 mg Oral Daily  . aspirin EC  81 mg Oral Daily  . azithromycin  1,200 mg Oral Weekly  . carvedilol  6.25 mg Oral BID WC  . darbepoetin (ARANESP) injection - DIALYSIS  25 mcg Intravenous Q Mon-HD  . Darunavir Ethanolate  800 mg Oral Q breakfast  . heparin  5,000 Units Subcutaneous 3 times per day  . hydrALAZINE  5 mg Intravenous Once  . lamiVUDine  50 mg Oral Daily  . loperamide  4 mg Oral  Once  . multivitamin  1 tablet Oral QHS  . ritonavir  100 mg Oral Q breakfast  . sodium chloride  3 mL Intravenous Q12H  . sodium chloride  3 mL Intravenous Q12H   Continuous Infusions:    Orson Eva, DO  [facility] Pager [phone]  If 7PM-7AM, please contact night-coverage [email or web address] Password [email or web address] 03/28/2014, 9:57 AM   LOS: 1 day

## 2014-03-28 NOTE — Procedures (Signed)
Patient was seen on dialysis and the procedure was supervised.  BFR 400  Via AVF BP is  144/97.   Patient appears to be tolerating treatment well  Louis Meckel 03/28/2014

## 2014-03-29 DIAGNOSIS — R079 Chest pain, unspecified: Secondary | ICD-10-CM | POA: Diagnosis not present

## 2014-03-29 DIAGNOSIS — N186 End stage renal disease: Secondary | ICD-10-CM | POA: Diagnosis not present

## 2014-03-29 DIAGNOSIS — J811 Chronic pulmonary edema: Secondary | ICD-10-CM | POA: Diagnosis not present

## 2014-03-29 DIAGNOSIS — B20 Human immunodeficiency virus [HIV] disease: Secondary | ICD-10-CM | POA: Diagnosis not present

## 2014-03-29 LAB — HIV-1 RNA QUANT-NO REFLEX-BLD
HIV 1 RNA Quant: 80 copies/mL — ABNORMAL HIGH (ref <20)
HIV-1 RNA QUANT, LOG: 1.9 {Log} — AB (ref <1.30)

## 2014-03-29 LAB — T-HELPER CELLS (CD4) COUNT (NOT AT ARMC)
CD4 % Helper T Cell: 14 % — ABNORMAL LOW (ref 33–55)
CD4 T Cell Abs: 250 /uL — ABNORMAL LOW (ref 400–2700)

## 2014-03-29 NOTE — Progress Notes (Addendum)
Pt unable to contact family needed to go home. K. Schorr called. New orders placed. Per pt, dad will pick him up in AM. Velora Mediate

## 2014-03-29 NOTE — Discharge Summary (Signed)
Physician Discharge Summary  Dylan Barber. Dylan Barber DOB: Mar 15, 1976 DOA: 03/27/2014  PCP: Placido Sou, MD  Admit date: 03/27/2014 Discharge date: 03/29/2014  Recommendations for Outpatient Follow-up:  1. Pt will need to follow up with PCP in 2 weeks post discharge 2. Please obtain BMP to evaluate electrolytes and kidney function 3. Please also check CBC to evaluate Hg and Hct levels  Discharge Diagnoses:  Pulmonary edema  -Secondary to ESRD and missing dialysis  -No respiratory distress presently  -Wean off oxygen  -He was dialyzed on 03/28/2014--he was weaned off of oxygen and remained clinically stable Atypical chest pain  -CT angiogram chest negative for pulmonary embolus  -Troponins negative x3  -EKG with nonspecific ST-T wave changes  -The patient's tachycardia improved after dialysis -The patient also has been missing some of his doses of carvedilol Diarrhea  -Compounded by Kayexalate  -May be related to the patient's antiretrovirals  -12/07/2013 CD4 count 390  -Stool C. difficile PCR--the patient's diarrhea improved and this was not collected prior to his discharge  ESRD  -Patient was dialyzed 03/28/2014 -Try to coordinate outpatient dialysis center  -states that he will be in [place] permanently  -Outpatient dialysis was set up at [facility]--his next scheduled dialysis will be 03/30/2014--patient should show up at 10:30 AM--this was communicated with the patient and he expressed understanding -The patient was previously discharged on 03/28/2014, but the patient had transportation problems and had to spend the evening in the hospital. He is clinically stable for discharge on 03/29/2014 HIV  -12/07/2013 HIV RNA--61  -CD4 count 390/20%  -Continue present antiretroviral therapy  -Unclear why the patient remains on azithromycin MAI prophylaxis  -Repeat CD4 count--if greater than 50, discontinue azithromycin --this is pending at the  time of discharge -He needs to followup with Dr. Linus Salmons Hypertension  -Continue carvedilol  Family Communication: Pt at beside  Disposition Plan: Home when medically stable   Discharge Condition: stable  Disposition: home  Diet:renal with 1.2 L fluid restrict Wt Readings from Last 3 Encounters:  03/29/14 71.2 kg (156 lb 15.5 oz)  03/24/14 67.7 kg (149 lb 4 oz)  12/14/13 62.596 kg (138 lb)    History of present illness:  38 y.o. male. who presented to the ED for SOB yesterday afternoon. He has been on HD since 2013, ESRD secondary to HTN; last HD was 5/14. He just moved back to to [place] last week to live with his parents. History of HTN and HIV. He reports he was feeling well except for SOB which started on Saturday night but got progressively worse and he began experiencing chest pressure with breathing so he came to the ED because he needed dialysis. He presented to the emergency department on Thursday, and had to be admitted to the nephrology service for dialysis. He was subsequently discharged home. He was told that he'll be set up for outpatient dialysis but he never heard back. And, then yesterday afternoon he started feeling short of breath. He felt like he had gained weight. He also noticed leg swelling. He started getting pressure-like sensation in the left side of his chest. It would increase with deep breathing. His chest discomfort improved after dialysis. Although the patient claims that he was never contacted previously but his dialysis, he was indeed contacted as confirmed by the nephrology service. His story changed, and he then stated that he was not able to get transportation to his previous dialysis center. Case management help assisted to arrange outpatient dialysis chair  at [facility] [place]. After this was confirmed, the patient was discharged in stable condition.  The patient was previously discharged on 03/28/2014, but the patient had transportation  problems and had to spend the evening in the hospital. He is clinically stable for discharge on 03/29/2014.       Consultants: nephrology Discharge Exam: Filed Vitals:   03/29/14 0935  BP: 114/75  Pulse: 96  Temp: 98.2 F (36.8 C)  Resp: 18   Filed Vitals:   03/28/14 1540 03/29/14 0007 03/29/14 0435 03/29/14 0935  BP: 128/82 129/79 103/69 114/75  Pulse: 113 100 98 96  Temp: 98.2 F (36.8 C) 98.5 F (36.9 C) 97.9 F (36.6 C) 98.2 F (36.8 C)  TempSrc: Oral Oral Oral Oral  Resp: 20 18 18 18   Height:      Weight:  71.2 kg (156 lb 15.5 oz)    SpO2: 96% 96% 95% 98%   General: A&O x 3, NAD, pleasant, cooperative Cardiovascular: RRR, no rub, no gallop, no S3 Respiratory: CTAB, no wheeze, no rhonchi Abdomen:soft, nontender, nondistended, positive bowel sounds Extremities: No edema, No lymphangitis, no petechiae  Discharge Instructions      Discharge Instructions   Increase activity slowly    Complete by:  As directed             Medication List         abacavir 300 MG tablet  Commonly known as:  ZIAGEN  Take 600 mg by mouth daily.     azithromycin 600 MG tablet  Commonly known as:  ZITHROMAX  Take 1,200 mg by mouth once a week. Mondays     carvedilol 6.25 MG tablet  Commonly known as:  COREG  Take 6.25 mg by mouth 2 (two) times daily with a meal.     lamivudine 100 MG tablet  Commonly known as:  EPIVIR  Take 50 mg by mouth daily.     losartan 50 MG tablet  Commonly known as:  COZAAR  Take 50 mg by mouth daily.     multivitamin tablet  Take 1 tablet by mouth daily.     PREZISTA 800 MG tablet  Generic drug:  Darunavir Ethanolate  Take 800 mg by mouth daily with breakfast.     ritonavir 100 MG Tabs tablet  Commonly known as:  NORVIR  Take 100 mg by mouth daily with breakfast.     sildenafil 100 MG tablet  Commonly known as:  VIAGRA  Take 100 mg by mouth daily as needed for erectile dysfunction.         The results of significant  diagnostics from this hospitalization (including imaging, microbiology, ancillary and laboratory) are listed below for reference.    Significant Diagnostic Studies: Dg Chest 2 View  03/24/2014   CLINICAL DATA:  pain  EXAM: CHEST  2 VIEW  COMPARISON:  DG CHEST 1V PORT dated 12/20/2013  FINDINGS: Low lung volumes. Cardiac silhouette is enlarged. There is diffuse thickening of interstitial markings and mild peribronchial cuffing. Linear areas of increased density left lung base. Stable calcified granuloma right lung. No acute osseous abnormalities.  IMPRESSION: Mild pulmonary edema.  Scarring versus atelectasis left lung base.   Electronically Signed   By: Margaree Mackintosh M.D.   On: 03/24/2014 12:51   Ct Angio Chest Pe W/cm &/or Wo Cm  03/27/2014   CLINICAL DATA:  Pleuritic chest pain,  EXAM: CT ANGIOGRAPHY CHEST WITH CONTRAST  TECHNIQUE: Multidetector CT imaging of the chest was performed using the  standard protocol during bolus administration of intravenous contrast. Multiplanar CT image reconstructions and MIPs were obtained to evaluate the vascular anatomy.  CONTRAST:  130mL OMNIPAQUE IOHEXOL 350 MG/ML SOLN  COMPARISON:  DG CHEST 2 VIEW dated 03/24/2014  FINDINGS: The thoracic inlet is unremarkable.  The heart is enlarged. Prominent lymph nodes measuring 1 cm or less within the prevascular space, and subcarinal regions. No mediastinal masses.  No filling defects within the main, lobar, or segmental pulmonary arteries. No thoracic aortic aneurysm nor dissection.  Small bilateral pleural effusions right greater than left.  Central airways are patent.  Diffuse ground-glass density throughout both lungs with mild prominence of the interstitial markings with areas of scarring versus atelectasis within the lung bases.  No focal regions of consolidation nor focal infiltrates.  The visualized upper abdominal viscera are unremarkable.  No aggressive appearing osseous lesions.  Review of the MIP images confirms the above  findings.  IMPRESSION: No CT evidence of pulmonary arterial embolic disease.  Interstitial infiltrate, mild, differential considerations include pulmonary edema, infectious or inflammatory etiologies.  Prominent mediastinal lymph nodes likely reactive  Cardiomegaly   Electronically Signed   By: Margaree Mackintosh M.D.   On: 03/27/2014 18:02     Microbiology: No results found for this or any previous visit (from the past 240 hour(s)).   Labs: Basic Metabolic Panel:  Recent Labs Lab 03/24/14 1223 03/27/14 1435 03/28/14 0636  NA 138 139 140  K 5.4* 6.1* 5.2  CL 106 99 102  CO2 16* 20 18*  GLUCOSE 78 87 81  BUN 57* 65* 70*  CREATININE 11.80* 12.19* 12.92*  CALCIUM 9.0 9.4 9.0  MG  --  2.8*  --   PHOS  --  4.1  --    Liver Function Tests:  Recent Labs Lab 03/28/14 0636  AST 24  ALT 30  ALKPHOS 98  BILITOT 0.5  PROT 7.2  ALBUMIN 3.0*   No results found for this basename: LIPASE, AMYLASE,  in the last 168 hours No results found for this basename: AMMONIA,  in the last 168 hours CBC:  Recent Labs Lab 03/24/14 1223 03/27/14 1435 03/28/14 0636  WBC 6.7 6.6 6.3  NEUTROABS  --  2.7  --   HGB 11.8* 11.9* 9.9*  HCT 36.4* 35.3* 30.4*  MCV 98.9 98.6 99.3  PLT 180 201 153   Cardiac Enzymes:  Recent Labs Lab 03/27/14 1435 03/27/14 2132 03/28/14 0636 03/28/14 0911  TROPONINI <0.30 <0.30 <0.30 <0.30   BNP: No components found with this basename: POCBNP,  CBG: No results found for this basename: GLUCAP,  in the last 168 hours  Time coordinating discharge:  Greater than 30 minutes  Signed:  Orson Eva, DO [facility] Pager: [phone] 03/29/2014, 10:25 AM

## 2014-04-05 ENCOUNTER — Encounter: Payer: Self-pay | Admitting: Internal Medicine

## 2014-04-12 ENCOUNTER — Ambulatory Visit: Payer: Managed Care, Other (non HMO) | Admitting: Internal Medicine

## 2014-05-14 ENCOUNTER — Inpatient Hospital Stay (HOSPITAL_COMMUNITY)
Admission: EM | Admit: 2014-05-14 | Discharge: 2014-05-15 | DRG: 811 | Disposition: A | Discharge status: Home / Self Care | Payer: Medicare Other | Attending: Internal Medicine | Admitting: Internal Medicine

## 2014-05-14 ENCOUNTER — Encounter (HOSPITAL_COMMUNITY): Payer: Self-pay | Admitting: Emergency Medicine

## 2014-05-14 DIAGNOSIS — N059 Unspecified nephritic syndrome with unspecified morphologic changes: Secondary | ICD-10-CM | POA: Diagnosis present

## 2014-05-14 DIAGNOSIS — Z91012 Allergy to eggs: Secondary | ICD-10-CM

## 2014-05-14 DIAGNOSIS — N186 End stage renal disease: Secondary | ICD-10-CM | POA: Diagnosis not present

## 2014-05-14 DIAGNOSIS — B20 Human immunodeficiency virus [HIV] disease: Secondary | ICD-10-CM | POA: Diagnosis not present

## 2014-05-14 DIAGNOSIS — I120 Hypertensive chronic kidney disease with stage 5 chronic kidney disease or end stage renal disease: Secondary | ICD-10-CM | POA: Diagnosis not present

## 2014-05-14 DIAGNOSIS — D631 Anemia in chronic kidney disease: Secondary | ICD-10-CM | POA: Diagnosis present

## 2014-05-14 DIAGNOSIS — D649 Anemia, unspecified: Secondary | ICD-10-CM | POA: Diagnosis not present

## 2014-05-14 DIAGNOSIS — I10 Essential (primary) hypertension: Secondary | ICD-10-CM | POA: Diagnosis present

## 2014-05-14 DIAGNOSIS — N2581 Secondary hyperparathyroidism of renal origin: Secondary | ICD-10-CM | POA: Diagnosis present

## 2014-05-14 DIAGNOSIS — Z888 Allergy status to other drugs, medicaments and biological substances status: Secondary | ICD-10-CM

## 2014-05-14 DIAGNOSIS — N039 Chronic nephritic syndrome with unspecified morphologic changes: Secondary | ICD-10-CM

## 2014-05-14 DIAGNOSIS — Z79899 Other long term (current) drug therapy: Secondary | ICD-10-CM

## 2014-05-14 DIAGNOSIS — Z841 Family history of disorders of kidney and ureter: Secondary | ICD-10-CM

## 2014-05-14 DIAGNOSIS — D6489 Other specified anemias: Principal | ICD-10-CM | POA: Diagnosis present

## 2014-05-14 DIAGNOSIS — Z992 Dependence on renal dialysis: Secondary | ICD-10-CM

## 2014-05-14 LAB — CBC
HCT: 18.9 % — ABNORMAL LOW (ref 39.0–52.0)
HEMATOCRIT: 25.8 % — AB (ref 39.0–52.0)
Hemoglobin: 5.9 g/dL — CL (ref 13.0–17.0)
Hemoglobin: 8.6 g/dL — ABNORMAL LOW (ref 13.0–17.0)
MCH: 31.1 pg (ref 26.0–34.0)
MCH: 30.8 pg (ref 26.0–34.0)
MCHC: 31.2 g/dL (ref 30.0–36.0)
MCHC: 33.3 g/dL (ref 30.0–36.0)
MCV: 99.5 fL (ref 78.0–100.0)
MCV: 92.5 fL (ref 78.0–100.0)
Platelets: 331 10*3/uL (ref 150–400)
Platelets: 232 10*3/uL (ref 150–400)
RBC: 1.9 MIL/uL — ABNORMAL LOW (ref 4.22–5.81)
RBC: 2.79 MIL/uL — ABNORMAL LOW (ref 4.22–5.81)
RDW: 13.8 % (ref 11.5–15.5)
RDW: 15.5 % (ref 11.5–15.5)
WBC: 6 10*3/uL (ref 4.0–10.5)
WBC: 8.6 10*3/uL (ref 4.0–10.5)

## 2014-05-14 LAB — BASIC METABOLIC PANEL
Anion gap: 10 (ref 5–15)
BUN: 18 mg/dL (ref 6–23)
CO2: 31 mEq/L (ref 19–32)
Calcium: 8.8 mg/dL (ref 8.4–10.5)
Chloride: 100 mEq/L (ref 96–112)
Creatinine, Ser: 7.14 mg/dL — ABNORMAL HIGH (ref 0.50–1.35)
GFR calc Af Amer: 10 mL/min — ABNORMAL LOW (ref >90)
GFR calc non Af Amer: 9 mL/min — ABNORMAL LOW (ref >90)
Glucose, Bld: 68 mg/dL — ABNORMAL LOW (ref 70–99)
Potassium: 4.7 mEq/L (ref 3.7–5.3)
Sodium: 141 mEq/L (ref 137–147)

## 2014-05-14 LAB — POC OCCULT BLOOD, ED: Fecal Occult Bld: NEGATIVE

## 2014-05-14 LAB — PHOSPHORUS: Phosphorus: 1.4 mg/dL — ABNORMAL LOW (ref 2.3–4.6)

## 2014-05-14 LAB — ABO/RH: ABO/RH(D): B POS

## 2014-05-14 LAB — PREPARE RBC (CROSSMATCH)

## 2014-05-14 MED ORDER — HEPARIN SODIUM (PORCINE) 5000 UNIT/ML IJ SOLN
5000.0000 [IU] | Freq: Three times a day (TID) | INTRAMUSCULAR | Status: DC
  Filled 2014-05-14 (×2): qty 1

## 2014-05-14 MED ORDER — ONDANSETRON HCL 4 MG PO TABS: 4.0000 mg | ORAL_TABLET | Freq: Four times a day (QID) | ORAL | Status: DC | PRN

## 2014-05-14 MED ORDER — DARUNAVIR ETHANOLATE 800 MG PO TABS
800.0000 mg | ORAL_TABLET | Freq: Every day | ORAL | Status: DC
  Administered 2014-05-15: 800 mg via ORAL
  Filled 2014-05-14 (×2): qty 1

## 2014-05-14 MED ORDER — ONDANSETRON HCL 4 MG/2ML IJ SOLN: 4.0000 mg | Freq: Four times a day (QID) | INTRAMUSCULAR | Status: DC | PRN

## 2014-05-14 MED ORDER — LIDOCAINE-PRILOCAINE 2.5-2.5 % EX CREA
1.0000 | TOPICAL_CREAM | CUTANEOUS | Status: DC | PRN
Start: 2014-05-14 — End: 2014-05-15

## 2014-05-14 MED ORDER — ALTEPLASE 2 MG IJ SOLR
2.0000 mg | Freq: Once | INTRAMUSCULAR | Status: AC | PRN
  Filled 2014-05-14: qty 2

## 2014-05-14 MED ORDER — SODIUM CHLORIDE 0.9 % IJ SOLN: 3.0000 mL | Freq: Two times a day (BID) | INTRAMUSCULAR | Status: DC

## 2014-05-14 MED ORDER — LAMIVUDINE 10 MG/ML PO SOLN
50.0000 mg | Freq: Every day | ORAL | Status: DC
  Administered 2014-05-14 – 2014-05-15 (×2): 50 mg via ORAL
  Filled 2014-05-14 (×2): qty 5

## 2014-05-14 MED ORDER — NEPRO/CARBSTEADY PO LIQD
237.0000 mL | ORAL | Status: DC | PRN
  Filled 2014-05-14: qty 237

## 2014-05-14 MED ORDER — LAMIVUDINE 100 MG PO TABS: 50.0000 mg | ORAL_TABLET | Freq: Every day | ORAL | Status: DC

## 2014-05-14 MED ORDER — HEPARIN SODIUM (PORCINE) 1000 UNIT/ML DIALYSIS
1000.0000 [IU] | INTRAMUSCULAR | Status: DC | PRN
  Filled 2014-05-14: qty 1

## 2014-05-14 MED ORDER — LIDOCAINE HCL (PF) 1 % IJ SOLN: 5.0000 mL | INTRAMUSCULAR | Status: DC | PRN

## 2014-05-14 MED ORDER — RITONAVIR 100 MG PO TABS
100.0000 mg | ORAL_TABLET | Freq: Every day | ORAL | Status: DC
  Administered 2014-05-15: 100 mg via ORAL
  Filled 2014-05-14 (×2): qty 1

## 2014-05-14 MED ORDER — ABACAVIR SULFATE 300 MG PO TABS
600.0000 mg | ORAL_TABLET | Freq: Every day | ORAL | Status: DC
  Administered 2014-05-14 – 2014-05-15 (×2): 600 mg via ORAL
  Filled 2014-05-14 (×2): qty 2

## 2014-05-14 MED ORDER — SODIUM CHLORIDE 0.9 % IV SOLN: 100.0000 mL | INTRAVENOUS | Status: DC | PRN

## 2014-05-14 MED ORDER — PENTAFLUOROPROP-TETRAFLUOROETH EX AERO: 1.0000 "application " | INHALATION_SPRAY | CUTANEOUS | Status: DC | PRN

## 2014-05-14 NOTE — H&P (Signed)
Date: 05/14/2014               Patient Name:  Dylan Barber. MRN: [id number]  DOB: [birth year] Age / Sex: 38 y.o., male   PCP: No Pcp Per Patient         Medical Service: Internal Medicine Teaching Service         Attending Physician: Dr. Bartholomew Crews, MD    First Contact: Dr. Lottie Mussel Pager: [phone]  Second Contact: Dr. 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 He has been on HD since 11/2012 and reports a steady decline in his hemoglobin levels. While undergoing dialysis yesterday, he reports his BP dropped to the 70s/50s and he felt lightheaded as though he was going to lose consciousness. He denies any actual loss of consciousness or confusion. His dialysis was cancelled 1.5-2 hrs into his usual 4 hour routine.  He reports feeling short of breath with wheezing, 2 pillow orthopnea, heart palpitations, dark colored stool (but not tarry), and leg twitching. He denies any chest pain, nausea, emesis, fevers, chills, night sweats, weight loss, diarrhea, constipation, numbness, or paresthesias.  PA called with concerns of hyperkalemia during HD but these records are unavailable. He had Aranesp increased from 25/week on 6/3  to 40/week on 6/24. He received his dose of Arenesp 40 on 7/1 and an additional 60 on 7/3.   Meds: Current Facility-Administered Medications  Medication Dose Route Frequency Provider Last Rate Last Dose  . 0.9 %  sodium chloride infusion  100 mL Intravenous PRN Myriam Jacobson, PA-C      . 0.9 %  sodium chloride infusion  100 mL Intravenous PRN Myriam Jacobson, PA-C      . alteplase (CATHFLO ACTIVASE) injection 2 mg  2 mg Intracatheter Once PRN Myriam Jacobson, PA-C      . feeding supplement (NEPRO CARB STEADY) liquid 237 mL  237 mL Oral PRN Myriam Jacobson, PA-C      . heparin injection 1,000 Units  1,000 Units Dialysis PRN Myriam Jacobson, PA-C      . lidocaine (PF) (XYLOCAINE) 1 % injection 5 mL  5 mL Intradermal PRN Myriam Jacobson, PA-C      . lidocaine-prilocaine (EMLA) cream 1 application  1 application Topical PRN Myriam Jacobson, PA-C      . pentafluoroprop-tetrafluoroeth (GEBAUERS) aerosol 1 application  1 application Topical PRN Myriam Jacobson, PA-C       Current Outpatient Prescriptions  Medication Sig Dispense Refill  . abacavir (ZIAGEN) 300 MG tablet Take 600 mg by mouth daily.      Marland Kitchen azithromycin (ZITHROMAX) 600 MG tablet Take 1,200 mg by mouth once a week. Mondays      . carvedilol (COREG) 6.25 MG tablet Take 6.25 mg by mouth 2 (two) times daily with a meal.      . Darunavir Ethanolate (PREZISTA) 800 MG tablet Take 800 mg by mouth daily with breakfast.      . diphenhydrAMINE (BENADRYL) 25 MG tablet Take 25 mg by mouth every 6 (six) hours as needed for itching. During dialysis      . lamivudine (  EPIVIR) 100 MG tablet Take 50 mg by mouth daily.      Marland Kitchen losartan (COZAAR) 50 MG tablet Take 50 mg by mouth daily.      . Multiple Vitamin (MULTIVITAMIN) tablet Take 1 tablet by mouth daily.      . ritonavir (NORVIR) 100 MG TABS tablet Take 100 mg by mouth daily with breakfast.      . sildenafil (VIAGRA) 100 MG tablet Take 100 mg by mouth daily as needed for erectile dysfunction.        Allergies: Allergies as of 05/14/2014 - Review Complete 05/14/2014  Allergen Reaction Noted  . Codeine Itching 05/03/2013  . Eggs or egg-derived products  05/03/2013  . Heparin Itching 05/14/2014  . Mercury  05/03/2013   Past Medical History  Diagnosis Date  . Hypertension   . Dialysis patient   . Renal disorder   . HIV disease   . Anemia   . Seizure   . Heart murmur    Past Surgical History  Procedure Laterality Date  .  Av fistula placement Left 05/07/2013    Procedure: ARTERIOVENOUS (AV) FISTULA CREATION- LEFT;  Surgeon: Rosetta Posner, MD;  Location: [place];  Service: Vascular;  Laterality: Left;  . Insertion of dialysis catheter      x 2  . Av fistula placement Left 08/12/2013    Procedure: ARTERIOVENOUS (AV) FISTULA CREATION BRACHIOCEPHALIC;  Surgeon: Angelia Mould, MD;  Location: [place];  Service: Vascular;  Laterality: Left;  . Ligation of arteriovenous  fistula Left 08/12/2013    Procedure: LIGATION OF ARTERIOVENOUS  FISTULA RADIOCEPHALIC;  Surgeon: Angelia Mould, MD;  Location[facility] OR;  Service: Vascular;  Laterality: Left;   Family History  Problem Relation Age of Onset  . Kidney failure Mother   . Kidney disease Mother   . Kidney failure Maternal Uncle   . Kidney disease Paternal Uncle   . Kidney disease Maternal Grandfather    History   Social History  . Marital Status: Single    Spouse Name: N/A    Number of Children: N/A  . Years of Education: N/A   Occupational History  . Not on file.   Social History Main Topics  . Smoking status: Never Smoker   . Smokeless tobacco: Never Used  . Alcohol Use: No  . Drug Use: No  . Sexual Activity: Not Currently    Partners: Female    Birth Control/ Protection: Condom     Comment: pt. given condoms   Other Topics Concern  . Not on file   Social History Narrative   Originally from [place],  States father worked at [employer].  Moved from [place], [place]. On 6/21 to live with father.     Review of Systems: Pertinent items are noted in HPI.  Physical Exam: Blood pressure 139/72, pulse 109, temperature 98.4 F (36.9 C), temperature source Oral, resp. rate 18, weight 149 lb 11.1 oz (67.9 kg), SpO2 100.00%. BP 139/72  Pulse 109  Temp(Src) 98.4 F (36.9 C) (Oral)  Resp 18  Wt 149 lb 11.1 oz (67.9 kg)  SpO2 100%  General Appearance:    Alert, cooperative, no distress, appears stated age  Head:    Normocephalic, without obvious  abnormality, atraumatic  Back:     Symmetric, no curvature  Lungs:     Clear to auscultation bilaterally, respirations unlabored  Chest wall:    No tenderness or deformity  Heart:    Regular rate and rhythm, S1 and S2  normal, no murmur, rub   or gallop  Abdomen:     Soft, non-tender, bowel sounds active all four quadrants,    no masses, no organomegaly  Rectal:    Normal tone, no external hemorrhoids, small internal hemorrhoids, no masses or tenderness; guaiac negative stool  Extremities:   AVF on L arm, atraumatic, no cyanosis or edema  Pulses:   2+ and symmetric all extremities  Skin:   Skin color, texture, turgor normal, no rashes or lesions    Lab results: Basic Metabolic Panel:  Recent Labs  05/14/14 1015  NA 141  K 4.7  CL 100  CO2 31  GLUCOSE 68*  BUN 18  CREATININE 7.14*  CALCIUM 8.8  PHOS 1.4*   Liver Function Tests: No results found for this basename: AST, ALT, ALKPHOS, BILITOT, PROT, ALBUMIN,  in the last 72 hours No results found for this basename: LIPASE, AMYLASE,  in the last 72 hours No results found for this basename: AMMONIA,  in the last 72 hours CBC:  Recent Labs  05/14/14 1015  WBC 6.0  HGB 5.9*  HCT 18.9*  MCV 99.5  PLT 331   Cardiac Enzymes: No results found for this basename: CKTOTAL, CKMB, CKMBINDEX, TROPONINI,  in the last 72 hours BNP: No results found for this basename: PROBNP,  in the last 72 hours D-Dimer: No results found for this basename: DDIMER,  in the last 72 hours CBG: No results found for this basename: GLUCAP,  in the last 72 hours Hemoglobin A1C: No results found for this basename: HGBA1C,  in the last 72 hours Fasting Lipid Panel: No results found for this basename: CHOL, HDL, LDLCALC, TRIG, CHOLHDL, LDLDIRECT,  in the last 72 hours Thyroid Function Tests: No results found for this basename: TSH, T4TOTAL, FREET4, T3FREE, THYROIDAB,  in the last 72 hours Anemia Panel: No results found for this basename: VITAMINB12,  FOLATE, FERRITIN, TIBC, IRON, RETICCTPCT,  in the last 72 hours Coagulation: No results found for this basename: LABPROT, INR,  in the last 72 hours Urine Drug Screen: Drugs of Abuse  No results found for this basename: labopia,  cocainscrnur,  labbenz,  amphetmu,  thcu,  labbarb    Alcohol Level: No results found for this basename: ETH,  in the last 72 hours Urinalysis: No results found for this basename: COLORURINE, APPERANCEUR, LABSPEC, PHURINE, GLUCOSEU, HGBUR, BILIRUBINUR, KETONESUR, PROTEINUR, UROBILINOGEN, NITRITE, LEUKOCYTESUR,  in the last 72 hours Misc. Labs:  FOBT negative  03/29/14 HIV1 RNA 80 03/29/14 CD4 250, 14% Helper T-cells  Imaging results:  No results found.  Other results: EKG: sinus tachycardia.  Assessment & Plan by Problem: Principal Problem:   Anemia Active Problems:   ESRD (end stage renal disease) on dialysis   HIV disease   HTN (hypertension)  1. Anemia: Mr. Cabbage has a hemoglobin of 5.9 with a baseline of 10-12 over the past 4 months. An active bleed has been ruled out by exam and fecal occult blood test.  He has known HIV nephropathy that is currently being treated with Aranesp 40/week because of his declining hemoglobin count. 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 He was symptomatic during his hypotensive episode yesterday at HD but is now tachycardic but otherwise currently asymptomatic. -Appreciate renal consult -transfuse 2 units PRBC today during HD -active type and screen -Aranesp increased to 100/week -continue to monitor  2.ESRD: HIV nephropathy contributory. Patient creatinine 7.1 today with other electrolytes normal on [facility] ED BMET.  -extra  HD today in addition to MWF schedule  3. HTN- BP normal -HD today -hold losartan and carvedilol home medications  4. HIV- Last CD4 count 250 -d/c azithromycin -continue home HAART   Dispo: Disposition is deferred at this time, awaiting improvement of  current medical problems. Anticipated discharge in approximately 2 day(s).   The patient does not have a current PCP (No Pcp Per Patient) and does need an [facility] hospital follow-up appointment after discharge.  The patient does not know have transportation limitations that hinder transportation to clinic appointments.  Signed: Kelby Aline, MD 05/14/2014, 5:04 PM

## 2014-05-14 NOTE — ED Provider Notes (Signed)
Medical screening examination/treatment/procedure(s) were conducted as a shared visit with non-physician practitioner(s) and myself.  I personally evaluated the patient during the encounter.   EKG Interpretation   Date/Time:  Saturday May 14 2014 09:59:17 EDT Ventricular Rate:  115 PR Interval:  149 QRS Duration: 78 QT Interval:  356 QTC Calculation: 492 R Axis:   53 Text Interpretation:  Sinus tachycardia Borderline low voltage, extremity  leads Minimal ST depression, lateral leads Borderline prolonged QT  interval No significant change since last tracing Confirmed by [facility]  MD,  Jenny Reichmann ([id number]) on 05/14/2014 11:12:28 AM     Exertional dyspnea consistent with symptomatic anemia; Pt aware of need for transfusion.  Babette Relic, MD 05/14/14 [phone]

## 2014-05-14 NOTE — ED Notes (Signed)
He states his HGB was 5.9 at dialysis yesterday and they told him to come to ED for blood transfusion. He only had 1.5 hours of dialysis yesterday "because my pressure was too low." He denies any pain

## 2014-05-14 NOTE — Consult Note (Signed)
[facility] Renal Consultation Note    Indication for Consultation:  Management of ESRD/hemodialysis; anemia, hypertension/volume and secondary hyperparathyroidism PCP:  HPI: Dylan Barber. is a 38 y.o. 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  He previously dialyzed at [facility], was incarcerated a while in [place] and is now back in the area and dialyzing on a MWF schedule at [facility].    Lab trends:  Hgb 11.6 6/3 on Aranesp 25 per week                              8.8  6/10                              7.6  6/19                              6.6  6/24   Aranesp increased to 40 per week                              5.9  7/01  Aranesp 40 given and an additional 60 was given 7/03 He complainer of weakness and tachycardia at dialysis 73 BP was low at 84/53. He was signifcantly above his edw but unable to pull fluid due to lower BP and cramping. EDW had been increased from 65 to 66 7/1 - he had got to a post HD of 65.9 as presently as 6/26 but due to large IDWG last weekend and low Hgb he had not been able to get volume off.  He was given stool cards Friday and told to go to the ED post HD Friday for transfusion. He arrived this am, because he was waiting for his dad to bring him.  He denies dizziness, but has had extreme fatigue. No SOB, CP, fever, chills, N, V, D change in stool color.  Past Medical History  Diagnosis Date  . Hypertension   . Dialysis patient   . Renal disorder   . HIV disease   . Anemia   . Seizure   . Heart murmur    Past Surgical History  Procedure Laterality Date  . Av fistula placement Left 05/07/2013    Procedure: ARTERIOVENOUS (AV) FISTULA CREATION- LEFT;  Surgeon: Rosetta Posner, MD;  Location: [place];  Service: Vascular;  Laterality: Left;  . Insertion of dialysis catheter      x 2  . Av fistula placement Left 08/12/2013    Procedure:  ARTERIOVENOUS (AV) FISTULA CREATION BRACHIOCEPHALIC;  Surgeon: Angelia Mould, MD;  Location: [place];  Service: Vascular;  Laterality: Left;  . Ligation of arteriovenous  fistula Left 08/12/2013    Procedure: LIGATION OF ARTERIOVENOUS  FISTULA RADIOCEPHALIC;  Surgeon: Angelia Mould, MD;  Location: [facility] OR;  Service: Vascular;  Laterality: Left;   Family History  Problem Relation Age of Onset  . Kidney failure Mother   . Kidney disease Mother   . Kidney failure Maternal Uncle   . Kidney disease Paternal Uncle   . Kidney disease Maternal Grandfather    Social History:  reports that he has never smoked. He has never used smokeless tobacco. He reports that he does not  drink alcohol or use illicit drugs. Allergies  Allergen Reactions  . Codeine Itching  . Eggs Or Egg-Derived Products     Shortness of breath  . Heparin Itching    Severe itching  . Mercury     Childhood allergy   Prior to Admission medications   Medication Sig Start Date End Date Taking? Authorizing Provider  abacavir (ZIAGEN) 300 MG tablet Take 600 mg by mouth daily.   Yes Historical Provider, MD  azithromycin (ZITHROMAX) 600 MG tablet Take 1,200 mg by mouth once a week. Mondays   Yes Historical Provider, MD  carvedilol (COREG) 6.25 MG tablet Take 6.25 mg by mouth 2 (two) times daily with a meal.   Yes Historical Provider, MD  Darunavir Ethanolate (PREZISTA) 800 MG tablet Take 800 mg by mouth daily with breakfast.   Yes Historical Provider, MD  diphenhydrAMINE (BENADRYL) 25 MG tablet Take 25 mg by mouth every 6 (six) hours as needed for itching. During dialysis   Yes Historical Provider, MD  lamivudine (EPIVIR) 100 MG tablet Take 50 mg by mouth daily.   Yes Historical Provider, MD  losartan (COZAAR) 50 MG tablet Take 50 mg by mouth daily.   Yes Historical Provider, MD  Multiple Vitamin (MULTIVITAMIN) tablet Take 1 tablet by mouth daily.   Yes Historical Provider, MD  ritonavir (NORVIR) 100 MG TABS tablet Take  100 mg by mouth daily with breakfast.   Yes Historical Provider, MD  sildenafil (VIAGRA) 100 MG tablet Take 100 mg by mouth daily as needed for erectile dysfunction.   Yes Historical Provider, MD   No current facility-administered medications for this encounter.   Current Outpatient Prescriptions  Medication Sig Dispense Refill  . abacavir (ZIAGEN) 300 MG tablet Take 600 mg by mouth daily.      Marland Kitchen azithromycin (ZITHROMAX) 600 MG tablet Take 1,200 mg by mouth once a week. Mondays      . carvedilol (COREG) 6.25 MG tablet Take 6.25 mg by mouth 2 (two) times daily with a meal.      . Darunavir Ethanolate (PREZISTA) 800 MG tablet Take 800 mg by mouth daily with breakfast.      . diphenhydrAMINE (BENADRYL) 25 MG tablet Take 25 mg by mouth every 6 (six) hours as needed for itching. During dialysis      . lamivudine (EPIVIR) 100 MG tablet Take 50 mg by mouth daily.      Marland Kitchen losartan (COZAAR) 50 MG tablet Take 50 mg by mouth daily.      . Multiple Vitamin (MULTIVITAMIN) tablet Take 1 tablet by mouth daily.      . ritonavir (NORVIR) 100 MG TABS tablet Take 100 mg by mouth daily with breakfast.      . sildenafil (VIAGRA) 100 MG tablet Take 100 mg by mouth daily as needed for erectile dysfunction.       Labs: Basic Metabolic Panel:  Recent Labs Lab 05/14/14 1015  NA 141  K 4.7  CL 100  CO2 31  GLUCOSE 68*  BUN 18  CREATININE 7.14*  CALCIUM 8.8  CBC:  Recent Labs Lab 05/14/14 1015  WBC 6.0  HGB 5.9*  HCT 18.9*  MCV 99.5  PLT 331   ROS: As per HPI otherwise negative.  Physical Exam: Filed Vitals:   05/14/14 1001 05/14/14 1100 05/14/14 1200 05/14/14 1300  BP: 129/93 127/92 123/72 126/73  Pulse:  123 116 125  Temp: 98.3 F (36.8 C)     TempSrc: Oral     Resp: 15  15 15 23   SpO2: 100% 100% 100% 100%     General: Well developed, well nourished, in no acute distress. Head: Normocephalic, atraumatic, sclera non-icteric, mucus membranes are moist Neck: Supple. JVD not  elevated. Lungs: Clear bilaterally to auscultation without wheezes, rales, or rhonchi. Breathing is unlabored. Heart: tachy regular rate 120s  Abdomen: Soft, non-tender, non-distended with normoactive bowel sounds. No rebound/guarding. No obvious abdominal masses. M-S:  Strength and tone appear normal for age. Lower extremities:without edema or ischemic changes, no open wounds  Neuro: Alert and oriented X 3. Moves all extremities spontaneously. Psych:  Responds to questions appropriately with a normal affect. 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 Symptomatic anemia - history as per HPI. I suspect it is related to ESA deficiency as hemocult is negative; transfuse 2 units today; outpt Aranesp dose has been increased to 100 per week starting next week 2. ESRD -  MWF -extra  HD today due to volume excess and trnsfusion/K load 3. Hypertension/volume  - BP better today - extra HD to help to back to volume; edw has been raised; he has had high IDWG - possibly made worse by symptomatic anemia 4. HIV - continue meds 5. Metabolic bone disease -  iPTH 184 - not on Hectorol P was 1.6 with monthly labs - recheck  today - added on to labs previously drawn; not on binders 6. Nutrition - renal diet  Myriam Jacobson, PA-C [place] [phone] 05/14/2014, 1:05 PM   Pt seen, examined and agree w A/P as above.  Kelly Splinter MD pager [phone]    cell [phone] 05/14/2014, 4:12 PM

## 2014-05-14 NOTE — ED Provider Notes (Signed)
CSN: [id number]     Arrival date & time 05/14/14  0944 History   First MD Initiated Contact with Patient 05/14/14 1002     Chief Complaint  Patient presents with  . Anemia     (Consider location/radiation/quality/duration/timing/severity/associated sxs/prior Treatment) HPI Patient presents to the emergency department with low hemoglobin with shortness of breath on exertion, lightheadedness, and weakness.  The patient, states, that the symptoms started one week ago.  He, states he's had a history of low hemoglobin since he started dialysis.  Patient denies chest pain, nausea, vomiting, diarrhea, dizziness, back pain, fever, cough, bloody stool, hematemesis, rash, headache, blurred vision, or syncope.  The patient, states, that nothing seems to make his condition, better.  Patient is advised to come to the emergency department by his dialysis doctor. Past Medical History  Diagnosis Date  . Hypertension   . Dialysis patient   . Renal disorder   . HIV disease   . Anemia   . Seizure   . Heart murmur    Past Surgical History  Procedure Laterality Date  . Av fistula placement Left 05/07/2013    Procedure: ARTERIOVENOUS (AV) FISTULA CREATION- LEFT;  Surgeon: Rosetta Posner, MD;  Location: [place];  Service: Vascular;  Laterality: Left;  . Insertion of dialysis catheter      x 2  . Av fistula placement Left 08/12/2013    Procedure: ARTERIOVENOUS (AV) FISTULA CREATION BRACHIOCEPHALIC;  Surgeon: Angelia Mould, MD;  Location: [place];  Service: Vascular;  Laterality: Left;  . Ligation of arteriovenous  fistula Left 08/12/2013    Procedure: LIGATION OF ARTERIOVENOUS  FISTULA RADIOCEPHALIC;  Surgeon: Angelia Mould, MD;  Location: [facility] OR;  Service: Vascular;  Laterality: Left;   Family History  Problem Relation Age of Onset  . Kidney failure Mother   . Kidney disease Mother   . Kidney failure Maternal Uncle   . Kidney disease Paternal Uncle   . Kidney disease Maternal Grandfather     History  Substance Use Topics  . Smoking status: Never Smoker   . Smokeless tobacco: Never Used  . Alcohol Use: No    Review of Systems  All other systems negative except as documented in the HPI. All pertinent positives and negatives as reviewed in the HPI.  Allergies  Codeine; Eggs or egg-derived products; Heparin; and Mercury  Home Medications   Prior to Admission medications   Medication Sig Start Date End Date Taking? Authorizing Provider  abacavir (ZIAGEN) 300 MG tablet Take 600 mg by mouth daily.   Yes Historical Provider, MD  azithromycin (ZITHROMAX) 600 MG tablet Take 1,200 mg by mouth once a week. Mondays   Yes Historical Provider, MD  carvedilol (COREG) 6.25 MG tablet Take 6.25 mg by mouth 2 (two) times daily with a meal.   Yes Historical Provider, MD  Darunavir Ethanolate (PREZISTA) 800 MG tablet Take 800 mg by mouth daily with breakfast.   Yes Historical Provider, MD  diphenhydrAMINE (BENADRYL) 25 MG tablet Take 25 mg by mouth every 6 (six) hours as needed for itching. During dialysis   Yes Historical Provider, MD  lamivudine (EPIVIR) 100 MG tablet Take 50 mg by mouth daily.   Yes Historical Provider, MD  losartan (COZAAR) 50 MG tablet Take 50 mg by mouth daily.   Yes Historical Provider, MD  Multiple Vitamin (MULTIVITAMIN) tablet Take 1 tablet by mouth daily.   Yes Historical Provider, MD  ritonavir (NORVIR) 100 MG TABS tablet Take 100 mg by mouth daily  with breakfast.   Yes Historical Provider, MD  sildenafil (VIAGRA) 100 MG tablet Take 100 mg by mouth daily as needed for erectile dysfunction.   Yes Historical Provider, MD   BP 129/93  Pulse 50  Temp(Src) 98.3 F (36.8 C) (Oral)  Resp 15  SpO2 100% Physical Exam  Nursing note and vitals reviewed. Constitutional: He is oriented to person, place, and time. He appears well-developed and well-nourished. No distress.  HENT:  Head: Normocephalic and atraumatic.  Mouth/Throat: Oropharynx is clear and moist.  Eyes:  Pupils are equal, round, and reactive to light.  Neck: Normal range of motion. Neck supple.  Cardiovascular: Normal rate, regular rhythm and normal heart sounds.  Exam reveals no gallop and no friction rub.   No murmur heard. Pulmonary/Chest: Effort normal and breath sounds normal. No respiratory distress.  Neurological: He is alert and oriented to person, place, and time. He exhibits normal muscle tone. Coordination normal.  Skin: Skin is warm and dry. No rash noted. No erythema.    ED Course  Procedures (including critical care time) Labs Review Labs Reviewed  CBC - Abnormal; Notable for the following:    RBC 1.90 (*)    Hemoglobin 5.9 (*)    HCT 18.9 (*)    All other components within normal limits  BASIC METABOLIC PANEL - Abnormal; Notable for the following:    Glucose, Bld 68 (*)    Creatinine, Ser 7.14 (*)    GFR calc non Af Amer 9 (*)    GFR calc Af Amer 10 (*)    All other components within normal limits  POC OCCULT BLOOD, ED  PREPARE RBC (CROSSMATCH)  TYPE AND SCREEN      EKG Interpretation   Date/Time:  Saturday May 14 2014 09:59:17 EDT Ventricular Rate:  115 PR Interval:  149 QRS Duration: 78 QT Interval:  356 QTC Calculation: 492 R Axis:   53 Text Interpretation:  Sinus tachycardia Borderline low voltage, extremity  leads Minimal ST depression, lateral leads Borderline prolonged QT  interval No significant change since last tracing Confirmed by [facility]  MD,  Jenny Reichmann ([id number]) on 05/14/2014 11:12:28 AM      The internal medicine teaching service resident will be down to admit the patient to the hospital.  The patient is advised of the plan and all questions were answered.     Brent General, PA-C 05/14/14 1112

## 2014-05-14 NOTE — Procedures (Signed)
I was present at this dialysis session, have reviewed the session itself and made  appropriate changes  Kelly Splinter MD (pgr) [phone]    ([phone] 05/14/2014, 4:18 PM

## 2014-05-15 DIAGNOSIS — I120 Hypertensive chronic kidney disease with stage 5 chronic kidney disease or end stage renal disease: Secondary | ICD-10-CM | POA: Diagnosis not present

## 2014-05-15 DIAGNOSIS — D649 Anemia, unspecified: Secondary | ICD-10-CM | POA: Diagnosis not present

## 2014-05-15 DIAGNOSIS — N186 End stage renal disease: Secondary | ICD-10-CM | POA: Diagnosis not present

## 2014-05-15 DIAGNOSIS — B20 Human immunodeficiency virus [HIV] disease: Secondary | ICD-10-CM | POA: Diagnosis not present

## 2014-05-15 LAB — RENAL FUNCTION PANEL
Albumin: 3 g/dL — ABNORMAL LOW (ref 3.5–5.2)
Anion gap: 13 (ref 5–15)
BUN: 16 mg/dL (ref 6–23)
CO2: 28 mEq/L (ref 19–32)
Calcium: 9 mg/dL (ref 8.4–10.5)
Chloride: 97 mEq/L (ref 96–112)
Creatinine, Ser: 5.36 mg/dL — ABNORMAL HIGH (ref 0.50–1.35)
GFR calc Af Amer: 14 mL/min — ABNORMAL LOW (ref >90)
GFR calc non Af Amer: 12 mL/min — ABNORMAL LOW (ref >90)
Glucose, Bld: 89 mg/dL (ref 70–99)
PHOSPHORUS: 3 mg/dL (ref 2.3–4.6)
Potassium: 4.9 mEq/L (ref 3.7–5.3)
Sodium: 138 mEq/L (ref 137–147)

## 2014-05-15 LAB — TYPE AND SCREEN
ABO/RH(D): B POS
Antibody Screen: NEGATIVE
Unit division: 0
Unit division: 0

## 2014-05-15 LAB — CBC
HEMATOCRIT: 25.7 % — AB (ref 39.0–52.0)
Hemoglobin: 8.6 g/dL — ABNORMAL LOW (ref 13.0–17.0)
MCH: 30.8 pg (ref 26.0–34.0)
MCHC: 33.5 g/dL (ref 30.0–36.0)
MCV: 92.1 fL (ref 78.0–100.0)
Platelets: 227 10*3/uL (ref 150–400)
RBC: 2.79 MIL/uL — ABNORMAL LOW (ref 4.22–5.81)
RDW: 15.5 % (ref 11.5–15.5)
WBC: 6.4 10*3/uL (ref 4.0–10.5)

## 2014-05-15 NOTE — Progress Notes (Signed)
  Dylan Barber Progress Note  Assessment/Plan: 1. Symptomatic anemia - history as per HPI. 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 ESRD - MWF -extra HD 7/4 due to volume excess and trnsfusion/K load 3. Hypertension/volume - BP ok - extra HD 7/4 helped to back to edw which was raised 1.5 kg last week.;He has had high IDWG - possibly made worse by symptomatic anemia; Net UF 2.7 with post weight 67.2 only somewhat above edw. Still somewhat tachy - has been off coreg and losartan - d/w Dr. Lynnae January -would resume BB and we can follow BP at outpt HD and decide whether to resume losartan 4. HIV - continue meds 5. 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 Nutrition - renal diet 7. Disp - ok for d/c   Myriam Jacobson, PA-C [facility] [phone] 05/15/2014,8:09 AM  LOS: 1 day   Pt seen, examined and agree w A/P as above. OK for d/c from renal standpoint.  Kelly Splinter MD pager [phone]    cell [phone] 05/15/2014, 9:13 AM    Subjective:   No c/o  Objective Filed Vitals:   05/14/14 1720 05/14/14 1901 05/14/14 2018 05/15/14 0420  BP: 107/60 127/82 121/92 115/78  Pulse: 101 119 117 115  Temp: 98.5 F (36.9 C) 99 F (37.2 C) 99.2 F (37.3 C) 98.5 F (36.9 C)  TempSrc: Oral Oral Oral Oral  Resp: 18 18 18 18   Weight: 67.2 kg (148 lb 2.4 oz)     SpO2: 100% 98% 99% 98%   Physical Exam General: NAD Heart: tachy regular Lungs: no wheezes or rales Abdomen: soft NT Extremities: no edema Dialysis Access: left upper AVF patent  Dialysis Orders: [place] 4 hr 180 66.5kg 400/800 2 K 2.25 Ca profile 2 aranesp 100 no Fe or Hectorol heparin 6000 Q HD; Fistulagram earlier  this month normal except atypical left innominate - 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 abacavir  600 mg Oral Daily  . Darunavir Ethanolate  800 mg Oral Q breakfast  . lamiVUDine  50 mg Oral Daily  . ritonavir  100 mg Oral Q breakfast  . sodium chloride  3 mL Intravenous Q12H

## 2014-05-15 NOTE — Progress Notes (Signed)
Subjective: Dylan Barber received 2 U PRBC with HD yesterday. He tolerated the transfusion and HD well with no complaints. He is currently asymptomatic with no chest pain, palpitations, lightheadedness, dizziness, or SOB. Nephrology feels patient is okay for discharge.   Objective: Vital signs in last 24 hours: Filed Vitals:   05/14/14 1720 05/14/14 1901 05/14/14 2018 05/15/14 0420  BP: 107/60 127/82 121/92 115/78  Pulse: 101 119 117 115  Temp: 98.5 F (36.9 C) 99 F (37.2 C) 99.2 F (37.3 C) 98.5 F (36.9 C)  TempSrc: Oral Oral Oral Oral  Resp: 18 18 18 18   Weight: 148 lb 2.4 oz (67.2 kg)     SpO2: 100% 98% 99% 98%   Weight change:   Intake/Output Summary (Last 24 hours) at 05/15/14 0946 Last data filed at 05/14/14 2018  Gross per 24 hour  Intake    910 ml  Output   2665 ml  Net  -1755 ml   BP 115/78  Pulse 115  Temp(Src) 98.5 F (36.9 C) (Oral)  Resp 18  Wt 148 lb 2.4 oz (67.2 kg)  SpO2 98%  General Appearance:    Alert, cooperative, no distress, appears stated age  Head:    Normocephalic, without obvious abnormality, atraumatic  Back:     Symmetric, no curvature  Lungs:     Clear to auscultation bilaterally, respirations unlabored  Heart:    Tachycardic with regular rhythm, S1 and S2 normal, no murmur, rub   or gallop  Abdomen:     Soft, non-tender, bowel sounds active all four quadrants,    no masses, no organomegaly  Extremities:   Extremities normal, atraumatic, no cyanosis or edema  Pulses:   2+ and symmetric radial pulses   Lab Results: CBC    Component Value Date/Time   WBC 6.4 05/15/2014 0410   RBC 2.79* 05/15/2014 0410   RBC 3.05* 05/04/2013 1716   HGB 8.6* 05/15/2014 0410   HCT 25.7* 05/15/2014 0410   PLT 227 05/15/2014 0410   MCV 92.1 05/15/2014 0410   MCH 30.8 05/15/2014 0410   MCHC 33.5 05/15/2014 0410   RDW 15.5 05/15/2014 0410   LYMPHSABS 3.3 03/27/2014 1435   MONOABS 0.4 03/27/2014 1435   EOSABS 0.2 03/27/2014 1435   BASOSABS 0.0 03/27/2014 1435     BMET    Component Value Date/Time   NA 138 05/15/2014 0410   K 4.9 05/15/2014 0410   CL 97 05/15/2014 0410   CO2 28 05/15/2014 0410   GLUCOSE 89 05/15/2014 0410   BUN 16 05/15/2014 0410   CREATININE 5.36* 05/15/2014 0410   CREATININE 6.35* 12/07/2013 1008   CALCIUM 9.0 05/15/2014 0410   GFRNONAA 12* 05/15/2014 0410   GFRNONAA 10* 12/07/2013 1008   GFRAA 14* 05/15/2014 0410   GFRAA 12* 12/07/2013 1008     Micro Results: No results found for this or any previous visit (from the past 240 hour(s)). Studies/Results: No results found. Medications: I have reviewed the patient's current medications. Scheduled Meds: . abacavir  600 mg Oral Daily  . Darunavir Ethanolate  800 mg Oral Q breakfast  . lamiVUDine  50 mg Oral Daily  . ritonavir  100 mg Oral Q breakfast  . sodium chloride  3 mL Intravenous Q12H   Continuous Infusions:  PRN Meds:.sodium chloride, sodium chloride, feeding supplement (NEPRO CARB STEADY), lidocaine (PF), lidocaine-prilocaine, ondansetron (ZOFRAN) IV, ondansetron, pentafluoroprop-tetrafluoroeth Assessment/Plan:  1. Anemia: [name] hemoglobin improved from 5.9 to 8.6 after (with a baseline of 10-12 over  the past 4 months) after receiving 2 U PRBC during HD yesterday. He tolerated the transfusion well with no signs of complications. 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 His Aranesp has been steadily increased over the past month and renal would like it at 100/week. He is slightly tachycardic but otherwise asymptomatic. -Appreciate renal consult  -active type and screen  -Aranesp increased to 100/week  -continue to monitor symptoms during HD  2.ESRD: HIV nephropathy contributory. Patient creatinine 5.4 today down from 7.1 with other electrolytes normal. Patient had extra HD yesterday as he did less than half of Friday's session and tolerated it without complication. -Appreciate renal -Resume MWF HD schedule   3. HTN- BP normal  -per renal,  will resume carvedilol but hold losartan on discharge   4. HIV- Last CD4 count 250  -d/c azithromycin  -continue home HAART  Dispo: Disposition is deferred at this time, awaiting improvement of current medical problems.  Anticipated discharge in approximately  today.   The patient does not know have a current PCP (No Pcp Per Patient) and does not need an [facility] hospital follow-up appointment after discharge. Will follow-up with renal tomorrow during HD.  The patient does not have transportation limitations that hinder transportation to clinic appointments.  .Services Needed at time of discharge: Y = Yes, Blank = No PT:   OT:   RN:   Equipment:   Other:     LOS: 1 day   Kelby Aline, MD 05/15/2014, 9:46 AM

## 2014-05-15 NOTE — Progress Notes (Signed)
  Date: 05/15/2014  Patient name: Dylan Barber.  Medical record number: [id number]  Date of birth: [birth year]   I have seen and evaluated [name]. and discussed their care with the Residency Team including Drs Ethelene Hal and [facility] Mr Shere was admitted for symptomatic anemia, which had slowly been getting worse. He became rather symptomatic at HD and it had to be stopped early. HgB on admit was 5.9 and he was transfused 2 units PRBC and HgB responded to 8.6 and has remained there for two CBC's.   On exam, he remains tachy to 115. No MRG. Alert & O. NAD. Multiple tattoos.   Assessment and Plan: I have seen and evaluated the patient as outlined above. I agree with the formulated Assessment and Plan as detailed in the residents' admission note, with the following changes:   1. Symptomatic normocytic anemia - guiac negative so doubt GI blood loss. Labs for hemolysis were not checked and pt now s/p PRBC. Likely cause though is bone marros suppression from chronic dz and HIV. Pt is on aranesp as outpt. Stable for D/C home.  Bartholomew Crews, MD 7/5/201512:09 PM

## 2014-05-15 NOTE — Discharge Summary (Signed)
Name: Dylan Barber. MRN: [id number] DOB: 1976-04-19 38 y.o. PCP: No Pcp Per Patient  Date of Admission: 05/14/2014  9:52 AM Date of Discharge: 05/16/2014 Attending Physician: No att. providers found  Discharge Diagnosis:  Principal Problem:   Anemia Active Problems:   ESRD (end stage renal disease) on dialysis   HIV disease   HTN (hypertension)  Discharge Medications:   Medication List    STOP taking these medications       azithromycin 600 MG tablet  Commonly known as:  ZITHROMAX     losartan 50 MG tablet  Commonly known as:  COZAAR     sildenafil 100 MG tablet  Commonly known as:  VIAGRA      TAKE these medications       abacavir 300 MG tablet  Commonly known as:  ZIAGEN  Take 600 mg by mouth daily.     carvedilol 6.25 MG tablet  Commonly known as:  COREG  Take 6.25 mg by mouth 2 (two) times daily with a meal.     diphenhydrAMINE 25 MG tablet  Commonly known as:  BENADRYL  Take 25 mg by mouth every 6 (six) hours as needed for itching. During dialysis     lamivudine 100 MG tablet  Commonly known as:  EPIVIR  Take 50 mg by mouth daily.     multivitamin tablet  Take 1 tablet by mouth daily.     PREZISTA 800 MG tablet  Generic drug:  Darunavir Ethanolate  Take 800 mg by mouth daily with breakfast.     ritonavir 100 MG Tabs tablet  Commonly known as:  NORVIR  Take 100 mg by mouth daily with breakfast.        Disposition and follow-up:   Dylan Barber. was discharged from [facility] in Good condition.  At the hospital follow up visit please address:  1.  Anemia  2.  Labs / imaging needed at time of follow-up: cbc  3.  Pending labs/ test needing follow-up: none  Follow-up Appointments:   Discharge Instructions: Discharge Instructions   Increase activity slowly    Complete by:  As directed            Consultations: Treatment Team:  Sol Blazing, MD  Procedures Performed:  No results  found.   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 He has been on HD since 11/2012 and reports a steady decline in his hemoglobin levels. While undergoing dialysis yesterday, he reports his BP dropped to the 70s/50s and he felt lightheaded as though he was going to lose consciousness. He denies any actual loss of consciousness or confusion. His dialysis was cancelled 1.5-2 hrs into his usual 4 hour routine. He reports feeling short of breath with wheezing, 2 pillow orthopnea, heart palpitations, dark colored stool (but not tarry), and leg twitching. He denies any chest pain, nausea, emesis, fevers, chills, night sweats, weight loss, diarrhea, constipation, numbness, or paresthesias.   PA called with concerns of hyperkalemia during HD but these records are unavailable. He had Aranesp increased from 25/week on 6/3 to 40/week on 6/24. He received his dose of Arenesp 40 on 7/1 and an additional 60 on 7/3.    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 An active bleed was ruled out by exam and fecal occult blood test. He has known HIV nephropathy that is currently being treated with Aranesp 40/week because of his declining hemoglobin count. 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 He was symptomatic during his hypotensive episode at HD  On 7/3. At the hospital, he was tachycardic in the low 100s but otherwise currently asymptomatic. He received 2 U PRBC during HD on 7/4 and his hemoglobin went up to 8.6. His Aranesp was also increased to 100/week per renal.  #ESRD: Dylan Barber ESRD is secondary to  HIV nephropathy. He had a creatinine of 7.1 on presentation and underwent HD on 7/4 as he completed less than half of his HD session on 7/3 when he had to stop due to presyncope. He will resume his MWF schedule and follow-up with his nephrology team.   #HTN: Carvedilol and losartan were initially held as the patient was normotensive. Per renal, the patient was restarted on carvedilol while losartan was discontinued. The decision to resume losartan will be made as an out-patient.  #HIV: His last HIV labs were 03/29/14 with an HIV 1 RNA of 80 and CD4 of 250, 14% Helper T-cells. His HAART was continued during hospitalization. Azithromycin was discontinued as his CD4 was greater the 50.   Discharge Vitals:   BP 130/80  Pulse 98  Temp(Src) 98.6 F (37 C) (Oral)  Resp 19  Wt 148 lb 2.4 oz (67.2 kg)  SpO2 97%  Discharge Labs:   7/4 EKG: sinus tachycardia 7/4 Fecal occult blood test negative  No results found for this or any previous visit (from the past 24 hour(s)). Discharge Physical Exam  General Appearance: Alert, cooperative, no distress, appears stated age  Head: Normocephalic, without obvious abnormality, atraumatic  Back: Symmetric, no curvature  Lungs: Clear to auscultation bilaterally, respirations unlabored  Heart: Tachycardic with regular rhythm, S1 and S2 normal, no murmur, rub or gallop  Abdomen: Soft, non-tender, bowel sounds active all four quadrants, no masses, no organomegaly  Extremities: Extremities normal, atraumatic, no cyanosis or edema  Pulses: 2+ and symmetric radial pulses   Signed: Kelby Aline, MD 05/16/2014, 3:29 PM    Services Ordered on Discharge: none Equipment Ordered on Discharge: none

## 2014-06-02 ENCOUNTER — Other Ambulatory Visit (HOSPITAL_COMMUNITY): Payer: Self-pay | Admitting: *Deleted

## 2014-06-02 ENCOUNTER — Telehealth: Payer: Self-pay | Admitting: *Deleted

## 2014-06-02 NOTE — Telephone Encounter (Signed)
Patient called stating he tried to refill his HIV meds and was told his patient assistance has run out. He no longer has insurance, is unemployed, and on dialysis. He was added to schedule for 06/06/14 to initiate a rush on [employer] and [employer]. He missed the appointment with Dr. Linus Salmons in June because he was hospitalized. Scheduled appt for 06/22/14 with MD.. He had a CD4 and viral load in May 2015 while in the hospital.

## 2014-06-03 ENCOUNTER — Ambulatory Visit (HOSPITAL_COMMUNITY)
Admission: RE | Admit: 2014-06-03 | Discharge: 2014-06-03 | Disposition: A | Discharge status: Home / Self Care | Payer: Managed Care, Other (non HMO) | Source: Ambulatory Visit | Attending: Nephrology | Admitting: Nephrology

## 2014-06-03 DIAGNOSIS — D649 Anemia, unspecified: Secondary | ICD-10-CM | POA: Insufficient documentation

## 2014-06-03 LAB — PREPARE RBC (CROSSMATCH)

## 2014-06-03 MED ORDER — ACETAMINOPHEN 325 MG PO TABS: 325.0000 mg | ORAL_TABLET | Freq: Once | ORAL | Status: DC

## 2014-06-03 MED ORDER — ACETAMINOPHEN 325 MG PO TABS
ORAL_TABLET | ORAL | Status: AC
  Administered 2014-06-03: 325 mg
  Filled 2014-06-03: qty 1

## 2014-06-03 MED ORDER — DIPHENHYDRAMINE HCL 25 MG PO CAPS
ORAL_CAPSULE | ORAL | Status: AC
  Administered 2014-06-03: 25 mg
  Filled 2014-06-03: qty 1

## 2014-06-03 MED ORDER — DIPHENHYDRAMINE HCL 25 MG PO CAPS: 25.0000 mg | ORAL_CAPSULE | Freq: Once | ORAL | Status: DC

## 2014-06-04 LAB — TYPE AND SCREEN
ABO/RH(D): B POS
Antibody Screen: NEGATIVE
UNIT DIVISION: 0
Unit division: 0

## 2014-06-07 ENCOUNTER — Ambulatory Visit: Payer: Managed Care, Other (non HMO)

## 2014-06-15 ENCOUNTER — Ambulatory Visit: Payer: Managed Care, Other (non HMO) | Admitting: Hematology and Oncology

## 2014-06-15 ENCOUNTER — Ambulatory Visit: Payer: Managed Care, Other (non HMO)

## 2014-06-16 ENCOUNTER — Encounter: Payer: Self-pay | Admitting: Hematology and Oncology

## 2014-06-16 ENCOUNTER — Ambulatory Visit (HOSPITAL_BASED_OUTPATIENT_CLINIC_OR_DEPARTMENT_OTHER): Payer: Medicare Other | Admitting: Hematology and Oncology

## 2014-06-16 ENCOUNTER — Ambulatory Visit: Payer: Medicare Other

## 2014-06-16 ENCOUNTER — Telehealth: Payer: Self-pay | Admitting: Hematology and Oncology

## 2014-06-16 ENCOUNTER — Other Ambulatory Visit: Payer: Medicare Other

## 2014-06-16 VITALS — BP 110/68 | HR 88 | Temp 98.6°F | Resp 19 | Ht 66.0 in | Wt 153.8 lb

## 2014-06-16 DIAGNOSIS — D649 Anemia, unspecified: Secondary | ICD-10-CM

## 2014-06-16 DIAGNOSIS — B20 Human immunodeficiency virus [HIV] disease: Secondary | ICD-10-CM | POA: Diagnosis not present

## 2014-06-16 DIAGNOSIS — D72820 Lymphocytosis (symptomatic): Secondary | ICD-10-CM | POA: Diagnosis not present

## 2014-06-16 DIAGNOSIS — Z992 Dependence on renal dialysis: Secondary | ICD-10-CM

## 2014-06-16 DIAGNOSIS — N186 End stage renal disease: Secondary | ICD-10-CM

## 2014-06-16 HISTORY — DX: Lymphocytosis (symptomatic): D72.820

## 2014-06-16 NOTE — Telephone Encounter (Signed)
pt sent back to lab. per 8/6 pof no rtn at this time.

## 2014-06-16 NOTE — Progress Notes (Signed)
[facility] CONSULT NOTE  Patient Care Team: No Pcp Per Patient as PCP - General (General Practice) Thayer Headings, MD as PCP - Infectious Diseases (Infectious Diseases)  CHIEF COMPLAINTS/PURPOSE OF CONSULTATION:  Acute on chronic anemia.  HISTORY OF PRESENTING ILLNESS:  Dylan Barber. 38 y.o. male is here because of acute on chronic anemia. This patient has end-stage renal disease and on the renal transplant list. He has been on dialysis 3 times a week since January of 2014. He has been receiving erythropoietin stimulating agents with Procrit since last year. Recently, he became profoundly anemic requiring blood transfusion. There were no signs of bleeding. His erythropoietin stimulating agents was switched to darbepoetin.  He denies recent chest pain on exertion, shortness of breath on minimal exertion, pre-syncopal episodes, or palpitations. He had not noticed any recent bleeding such as epistaxis, hematuria or hematochezia The patient denies over the counter NSAID ingestion. He is not on antiplatelets agents.  He had no prior history or diagnosis of cancer. His age appropriate screening programs are up-to-date. He denies any pica and eats a variety of diet. He never donated blood  MEDICAL HISTORY:  Past Medical History  Diagnosis Date  . Hypertension   . Dialysis patient   . Renal disorder   . HIV disease   . Anemia   . Seizure   . Heart murmur   . Lymphocytosis 06/16/2014    SURGICAL HISTORY: Past Surgical History  Procedure Laterality Date  . Av fistula placement Left 05/07/2013    Procedure: ARTERIOVENOUS (AV) FISTULA CREATION- LEFT;  Surgeon: Rosetta Posner, MD;  Location: [facility];  Service: Vascular;  Laterality: Left;  . Insertion of dialysis catheter      x 2  . Av fistula placement Left 08/12/2013    Procedure: ARTERIOVENOUS (AV) FISTULA CREATION BRACHIOCEPHALIC;  Surgeon: Angelia Mould, MD;  Location: [facility];  Service: Vascular;   Laterality: Left;  . Ligation of arteriovenous  fistula Left 08/12/2013    Procedure: LIGATION OF ARTERIOVENOUS  FISTULA RADIOCEPHALIC;  Surgeon: Angelia Mould, MD;  Location: [facility];  Service: Vascular;  Laterality: Left;    SOCIAL HISTORY: History   Social History  . Marital Status: Single    Spouse Name: N/A    Number of Children: N/A  . Years of Education: N/A   Occupational History  . Not on file.   Social History Main Topics  . Smoking status: Never Smoker   . Smokeless tobacco: Never Used  . Alcohol Use: No  . Drug Use: No  . Sexual Activity: Not Currently    Partners: Female    Birth Control/ Protection: Condom     Comment: pt. given condoms   Other Topics Concern  . Not on file   Social History Narrative   Originally from [place],  States father worked at [employer].  Moved from [place], [place]. On 6/21 to live with father.     FAMILY HISTORY: Family History  Problem Relation Age of Onset  . Kidney failure Mother   . Kidney disease Mother   . Kidney failure Maternal Uncle   . Kidney disease Paternal Uncle   . Kidney disease Maternal Grandfather     ALLERGIES:  is allergic to codeine; eggs or egg-derived products; heparin; and mercury.  MEDICATIONS:  Current Outpatient Prescriptions  Medication Sig Dispense Refill  . abacavir (ZIAGEN) 300 MG tablet Take 600 mg by mouth daily.      . carvedilol (COREG) 6.25  MG tablet Take 6.25 mg by mouth 2 (two) times daily with a meal.      . Darunavir Ethanolate (PREZISTA) 800 MG tablet Take 800 mg by mouth daily with breakfast.      . diphenhydrAMINE (BENADRYL) 25 MG tablet Take 25 mg by mouth every 6 (six) hours as needed for itching. During dialysis      . lamivudine (EPIVIR) 100 MG tablet Take 50 mg by mouth daily.      . Multiple Vitamin (MULTIVITAMIN) tablet Take 1 tablet by mouth daily.      . ritonavir (NORVIR) 100 MG TABS tablet Take 100 mg by mouth daily with breakfast.       No current  facility-administered medications for this visit.    REVIEW OF SYSTEMS:   Constitutional: Denies fevers, chills or abnormal night sweats Eyes: Denies blurriness of vision, double vision or watery eyes Ears, nose, mouth, throat, and face: Denies mucositis or sore throat Respiratory: Denies cough, dyspnea or wheezes Cardiovascular: Denies palpitation, chest discomfort or lower extremity swelling Gastrointestinal:  Denies nausea, heartburn or change in bowel habits Skin: Denies abnormal skin rashes Lymphatics: Denies new lymphadenopathy or easy bruising Neurological:Denies numbness, tingling or new weaknesses Behavioral/Psych: Mood is stable, no new changes  All other systems were reviewed with the patient and are negative.  PHYSICAL EXAMINATION: ECOG PERFORMANCE STATUS: 0 - Asymptomatic  Filed Vitals:   06/16/14 1127  BP: 110/68  Pulse: 88  Temp: 98.6 F (37 C)  Resp: 19   Filed Weights   06/16/14 1127  Weight: 153 lb 12.8 oz (69.763 kg)    GENERAL:alert, no distress and comfortable SKIN: skin color, texture, turgor are normal, no rashes or significant lesions EYES: normal, conjunctiva are pink and non-injected, sclera clear OROPHARYNX:no exudate, no erythema and lips, buccal mucosa, and tongue normal  NECK: supple, thyroid normal size, non-tender, without nodularity LYMPH:  no palpable lymphadenopathy in the cervical, axillary or inguinal LUNGS: clear to auscultation and percussion with normal breathing effort HEART: regular rate & rhythm and no murmurs and no lower extremity edema ABDOMEN:abdomen soft, non-tender and normal bowel sounds Musculoskeletal:no cyanosis of digits and no clubbing. 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 ASSESSMENT & PLAN:  Normocytic anemia This could be related to recent infection, chronic bone marrow suppression from HIV medication, antibody against Procrit or other causes. I will order additional workup. In the meantime, he will continue treatment with erythropoietin stimulating agents under the care of his nephrologist.  Lymphocytosis This is likely related to chronic HIV infection. Just to be sure, I will order flow cytometry to exclude clonality  Human immunodeficiency virus (HIV) disease He is doing well on antiretrovirals medication. His last HIV viral load appeared to be suppressed.  End stage renal disease He will continue on hemodialysis on Mondays, Wednesdays and Fridays.    All questions were answered. The patient knows to call the clinic with any problems, questions or concerns. I spent 30 minutes counseling the patient face to face. The total time spent in the appointment was 40 minutes and more than 50% was on counseling.     [place],  Gerarda Conklin, MD 06/16/2014 9:07 PM

## 2014-06-16 NOTE — Assessment & Plan Note (Signed)
He will continue on hemodialysis on Mondays, Wednesdays and Fridays.

## 2014-06-16 NOTE — Progress Notes (Signed)
Checked in new patient with no financial issues prior to seeing the dr. He has new card he will bring back in today to get scanned. It is still with [employer]. He has applied for medicare. He has appt card and has not been out of the [place].

## 2014-06-16 NOTE — Assessment & Plan Note (Signed)
This is likely related to chronic HIV infection. Just to be sure, I will order flow cytometry to exclude clonality

## 2014-06-16 NOTE — Assessment & Plan Note (Signed)
This could be related to recent infection, chronic bone marrow suppression from HIV medication, antibody against Procrit or other causes. I will order additional workup. In the meantime, he will continue treatment with erythropoietin stimulating agents under the care of his nephrologist.

## 2014-06-16 NOTE — Assessment & Plan Note (Signed)
He is doing well on antiretrovirals medication. His last HIV viral load appeared to be suppressed.

## 2014-06-17 ENCOUNTER — Other Ambulatory Visit: Payer: Self-pay | Admitting: Hematology and Oncology

## 2014-06-20 ENCOUNTER — Telehealth: Payer: Self-pay | Admitting: *Deleted

## 2014-06-20 ENCOUNTER — Other Ambulatory Visit: Payer: Self-pay | Admitting: *Deleted

## 2014-06-20 NOTE — Telephone Encounter (Signed)
Spoke with patient about missed lab appointment on Friday. Pt left after appt with Dr Alvy Bimler, did not go to lab. He will come back to have labs drawn- schedulers to call and make appointment

## 2014-06-21 ENCOUNTER — Other Ambulatory Visit: Payer: Self-pay | Admitting: *Deleted

## 2014-06-21 ENCOUNTER — Encounter: Payer: Self-pay | Admitting: Internal Medicine

## 2014-06-21 ENCOUNTER — Ambulatory Visit (INDEPENDENT_AMBULATORY_CARE_PROVIDER_SITE_OTHER): Payer: Medicare Other | Admitting: Internal Medicine

## 2014-06-21 VITALS — BP 122/74 | HR 93 | Temp 98.5°F | Ht 66.0 in | Wt 154.0 lb

## 2014-06-21 DIAGNOSIS — B20 Human immunodeficiency virus [HIV] disease: Secondary | ICD-10-CM

## 2014-06-21 MED ORDER — LAMIVUDINE 100 MG PO TABS: 50.0000 mg | ORAL_TABLET | Freq: Every day | ORAL | Status: DC

## 2014-06-21 MED ORDER — ABACAVIR SULFATE 300 MG PO TABS: 600.0000 mg | ORAL_TABLET | Freq: Every day | ORAL | Status: DC

## 2014-06-21 MED ORDER — RITONAVIR 100 MG PO TABS: 100.0000 mg | ORAL_TABLET | Freq: Every day | ORAL | Status: DC

## 2014-06-21 MED ORDER — DARUNAVIR ETHANOLATE 800 MG PO TABS: 800.0000 mg | ORAL_TABLET | Freq: Every day | ORAL | Status: DC

## 2014-06-21 NOTE — Progress Notes (Signed)
  Subjective:    Patient ID: Dylan Cable., male    DOB: 1976/07/12, 38 y.o.   MRN: [id number]  HPI  He comes in for follow up. He was newly diagnosed HIV earlier last year while in [place]. He has a low CD4 count at the time and also was noted to be in renal failure and has been on dialysis since. He previously did not know of any medical problems.  He moved back here which is home for him.  He then moved back to [place] a while but now back here again.  He was recently hospitalized with SOB from not doing regular dialysis.  He is to dialysis Monday Wednesday and Friday and nephrologist is Dr. Hulen Luster. He was on lamivudine 50 mg daily, Prezista, norvir and abacavir until 3 days ago when he ran out.  Had COBRA but ran out.  Applied for ADAP and waiting.  He though has had no new issues in fact feels well. Says [place] is going [place].    Review of Systems  Constitutional: Negative for fever, activity change, appetite change, fatigue and unexpected weight change.  HENT: Negative for sore throat and trouble swallowing.   Eyes: Negative for visual disturbance.  Respiratory: Negative for shortness of breath and stridor.   Cardiovascular: Negative for chest pain and leg swelling.  Gastrointestinal: Negative for nausea and diarrhea.  Musculoskeletal: Negative for arthralgias and myalgias.  Skin: Negative for rash.  Neurological: Negative for dizziness, light-headedness and headaches.  Hematological: Negative for adenopathy.  Psychiatric/Behavioral: Negative for dysphoric mood.       Objective:   Physical Exam  Constitutional: He is oriented to person, place, and time. He appears well-developed and well-nourished. No distress.  Eyes: Right eye exhibits no discharge. Left eye exhibits no discharge. No scleral icterus.  Cardiovascular: Normal rate, regular rhythm and normal heart sounds.   No murmur heard. Pulmonary/Chest: Effort normal and breath sounds normal.  No respiratory distress.  Lymphadenopathy:    He has no cervical adenopathy.  Neurological: He is alert and oriented to person, place, and time.  Skin: Skin is warm and dry. No rash noted.  Psychiatric: He has a normal mood and affect. His behavior is normal.          Assessment & Plan:

## 2014-06-21 NOTE — Assessment & Plan Note (Signed)
Unfortunately is off again. Has already applied and waiting and is trying to get medicare.  Will restart same regimen when ADAP approved.

## 2014-06-22 ENCOUNTER — Encounter: Payer: Self-pay | Admitting: *Deleted

## 2014-06-23 ENCOUNTER — Other Ambulatory Visit: Payer: Self-pay | Admitting: *Deleted

## 2014-06-23 ENCOUNTER — Telehealth: Payer: Self-pay | Admitting: Hematology and Oncology

## 2014-06-23 NOTE — Telephone Encounter (Signed)
S/w pt gave appt for lab 8/18.

## 2014-06-27 ENCOUNTER — Other Ambulatory Visit: Payer: Self-pay | Admitting: *Deleted

## 2014-06-27 DIAGNOSIS — B20 Human immunodeficiency virus [HIV] disease: Secondary | ICD-10-CM

## 2014-06-27 MED ORDER — DARUNAVIR ETHANOLATE 800 MG PO TABS: 800.0000 mg | ORAL_TABLET | Freq: Every day | ORAL | Status: DC

## 2014-06-27 MED ORDER — LAMIVUDINE 100 MG PO TABS: 50.0000 mg | ORAL_TABLET | Freq: Every day | ORAL | Status: DC

## 2014-06-27 MED ORDER — ABACAVIR SULFATE 300 MG PO TABS: 600.0000 mg | ORAL_TABLET | Freq: Every day | ORAL | Status: DC

## 2014-06-28 ENCOUNTER — Other Ambulatory Visit (HOSPITAL_BASED_OUTPATIENT_CLINIC_OR_DEPARTMENT_OTHER): Payer: Medicare Other

## 2014-06-28 ENCOUNTER — Other Ambulatory Visit: Payer: Self-pay | Admitting: Hematology and Oncology

## 2014-06-28 ENCOUNTER — Other Ambulatory Visit (HOSPITAL_COMMUNITY)
Admission: RE | Admit: 2014-06-28 | Discharge: 2014-06-28 | Disposition: A | Discharge status: Home / Self Care | Payer: Medicare Other | Source: Ambulatory Visit | Attending: Hematology and Oncology | Admitting: Hematology and Oncology

## 2014-06-28 ENCOUNTER — Ambulatory Visit (HOSPITAL_COMMUNITY)
Admission: RE | Admit: 2014-06-28 | Discharge: 2014-06-28 | Disposition: A | Discharge status: Home / Self Care | Payer: Medicare Other | Source: Ambulatory Visit | Attending: Hematology and Oncology | Admitting: Hematology and Oncology

## 2014-06-28 ENCOUNTER — Telehealth: Payer: Self-pay | Admitting: *Deleted

## 2014-06-28 DIAGNOSIS — D7289 Other specified disorders of white blood cells: Secondary | ICD-10-CM | POA: Diagnosis not present

## 2014-06-28 DIAGNOSIS — D649 Anemia, unspecified: Secondary | ICD-10-CM

## 2014-06-28 DIAGNOSIS — N186 End stage renal disease: Secondary | ICD-10-CM

## 2014-06-28 DIAGNOSIS — D638 Anemia in other chronic diseases classified elsewhere: Secondary | ICD-10-CM | POA: Insufficient documentation

## 2014-06-28 LAB — CBC & DIFF AND RETIC
BASO%: 0.2 % (ref 0.0–2.0)
Basophils Absolute: 0 10*3/uL (ref 0.0–0.1)
EOS%: 2.5 % (ref 0.0–7.0)
Eosinophils Absolute: 0.2 10*3/uL (ref 0.0–0.5)
HCT: 20.2 % — ABNORMAL LOW (ref 38.4–49.9)
IMMATURE RETIC FRACT: 1.8 % — AB (ref 3.00–10.60)
LYMPH%: 32.5 % (ref 14.0–49.0)
MCH: 29 pg (ref 27.2–33.4)
MCHC: 32.2 g/dL (ref 32.0–36.0)
MCV: 90.2 fL (ref 79.3–98.0)
MONO#: 1 10*3/uL — ABNORMAL HIGH (ref 0.1–0.9)
MONO%: 16.5 % — AB (ref 0.0–14.0)
NEUT#: 3 10*3/uL (ref 1.5–6.5)
NEUT%: 48.3 % (ref 39.0–75.0)
Platelets: 329 10*3/uL (ref 140–400)
RBC: 2.24 10*6/uL — ABNORMAL LOW (ref 4.20–5.82)
RDW: 14.1 % (ref 11.0–14.6)
RETIC CT ABS: 6.27 10*3/uL — AB (ref 34.80–93.90)
Retic %: <0.38 % — ABNORMAL LOW (ref 0.5–1.6)
WBC: 6.3 10*3/uL (ref 4.0–10.3)
lymph#: 2.1 10*3/uL (ref 0.9–3.3)

## 2014-06-28 LAB — MORPHOLOGY
PLT EST: ADEQUATE
RBC Comments: NORMAL

## 2014-06-28 LAB — COMPREHENSIVE METABOLIC PANEL (CC13)
ALK PHOS: 136 U/L (ref 40–150)
ALT: 16 U/L (ref 0–55)
ANION GAP: 10 meq/L (ref 3–11)
AST: 24 U/L (ref 5–34)
Albumin: 3.2 g/dL — ABNORMAL LOW (ref 3.5–5.0)
BILIRUBIN TOTAL: 0.34 mg/dL (ref 0.20–1.20)
BUN: 13.3 mg/dL (ref 7.0–26.0)
CO2: 32 meq/L — AB (ref 22–29)
CREATININE: 8.6 mg/dL — AB (ref 0.7–1.3)
Calcium: 8.5 mg/dL (ref 8.4–10.4)
Chloride: 99 mEq/L (ref 98–109)
Glucose: 87 mg/dl (ref 70–140)
Potassium: 3.2 mEq/L — ABNORMAL LOW (ref 3.5–5.1)
SODIUM: 140 meq/L (ref 136–145)
TOTAL PROTEIN: 8.3 g/dL (ref 6.4–8.3)

## 2014-06-28 LAB — IRON AND TIBC CHCC
%SAT: 52 % (ref 20–55)
Iron: 86 ug/dL (ref 42–163)
TIBC: 164 ug/dL — AB (ref 202–409)
UIBC: 78 ug/dL — AB (ref 117–376)

## 2014-06-28 LAB — FERRITIN CHCC: Ferritin: 1802 ng/ml — ABNORMAL HIGH (ref 22–316)

## 2014-06-28 LAB — LACTATE DEHYDROGENASE (CC13): LDH: 222 U/L (ref 125–245)

## 2014-06-28 NOTE — Telephone Encounter (Signed)
No availability for Transfusion here at [facility] on Thursday 8/20.  Scheduled pt for transfusion at [facility] on 8/20 at 9 am.  Instructed pt to come to [facility] lab at 8 am on Thursday for type and screen and then go to [facility] for transfusion at 9 am.  He verbalized understanding.  Request sent to scheduler to add on for lab 8/20 and to see POF from Dr. Alvy Bimler to f/u in one week.Marland Kitchen

## 2014-06-28 NOTE — Telephone Encounter (Signed)
Pt Hgb 6.5.  Called pt to notify of anemia and order for blood transfusion today.  Pt has already left the [facility] and said there is no way he can come back today for transfusion.  He has Dialysis tomorrow and so asks if he can come back on Thursday this week for transfusion?  He denies any sob, chest pains or dizziness.  Instructed pt to go to ED if he has any chest pains or sob.  I will notify Dr. Alvy Bimler and call him back.  He verbalized understanding.

## 2014-06-28 NOTE — Telephone Encounter (Signed)
OK 

## 2014-06-29 ENCOUNTER — Telehealth: Payer: Self-pay | Admitting: Hematology and Oncology

## 2014-06-29 LAB — VITAMIN B12: Vitamin B-12: 833 pg/mL (ref 211–911)

## 2014-06-29 LAB — DIRECT ANTIGLOBULIN TEST (NOT AT ARMC)
DAT (COMPLEMENT): NEGATIVE
DAT IGG: POSITIVE — AB

## 2014-06-29 LAB — HAPTOGLOBIN: HAPTOGLOBIN: 119 mg/dL (ref 45–215)

## 2014-06-29 LAB — SEDIMENTATION RATE: Sed Rate: 105 mm/hr — ABNORMAL HIGH (ref 0–16)

## 2014-06-29 NOTE — Telephone Encounter (Signed)
Pt aware ov per 08/18 POF.......KJ

## 2014-06-30 ENCOUNTER — Other Ambulatory Visit: Payer: Self-pay | Admitting: *Deleted

## 2014-06-30 ENCOUNTER — Non-Acute Institutional Stay (HOSPITAL_COMMUNITY)
Admission: AD | Admit: 2014-06-30 | Discharge: 2014-06-30 | Disposition: A | Discharge status: Home / Self Care | Payer: Medicare Other | Source: Ambulatory Visit | Attending: Hematology and Oncology | Admitting: Hematology and Oncology

## 2014-06-30 ENCOUNTER — Other Ambulatory Visit (HOSPITAL_BASED_OUTPATIENT_CLINIC_OR_DEPARTMENT_OTHER): Payer: Medicare Other

## 2014-06-30 ENCOUNTER — Telehealth: Payer: Self-pay | Admitting: Licensed Clinical Social Worker

## 2014-06-30 DIAGNOSIS — I120 Hypertensive chronic kidney disease with stage 5 chronic kidney disease or end stage renal disease: Secondary | ICD-10-CM | POA: Insufficient documentation

## 2014-06-30 DIAGNOSIS — D638 Anemia in other chronic diseases classified elsewhere: Secondary | ICD-10-CM | POA: Insufficient documentation

## 2014-06-30 DIAGNOSIS — N186 End stage renal disease: Secondary | ICD-10-CM | POA: Insufficient documentation

## 2014-06-30 LAB — CBC & DIFF AND RETIC
BASO%: 1 % (ref 0.0–2.0)
BASOS ABS: 0.1 10*3/uL (ref 0.0–0.1)
EOS%: 1.2 % (ref 0.0–7.0)
Eosinophils Absolute: 0.1 10*3/uL (ref 0.0–0.5)
HEMATOCRIT: 22.3 % — AB (ref 38.4–49.9)
HGB: 7.2 g/dL — ABNORMAL LOW (ref 13.0–17.1)
Immature Retic Fract: 52 % — ABNORMAL HIGH (ref 3.00–10.60)
LYMPH%: 43.3 % (ref 14.0–49.0)
MCH: 29.4 pg (ref 27.2–33.4)
MCHC: 32.3 g/dL (ref 32.0–36.0)
MCV: 91 fL (ref 79.3–98.0)
MONO#: 1.4 10*3/uL — ABNORMAL HIGH (ref 0.1–0.9)
MONO%: 19.7 % — ABNORMAL HIGH (ref 0.0–14.0)
NEUT#: 2.5 10*3/uL (ref 1.5–6.5)
NEUT%: 34.8 % — ABNORMAL LOW (ref 39.0–75.0)
Platelets: 376 10*3/uL (ref 140–400)
RBC: 2.45 10*6/uL — ABNORMAL LOW (ref 4.20–5.82)
RDW: 14.3 % (ref 11.0–14.6)
Retic %: 1.71 % (ref 0.80–1.80)
Retic Ct Abs: 41.9 10*3/uL (ref 34.80–93.90)
WBC: 7.3 10*3/uL (ref 4.0–10.3)
lymph#: 3.1 10*3/uL (ref 0.9–3.3)

## 2014-06-30 LAB — HOLD TUBE, BLOOD BANK

## 2014-06-30 LAB — ABO/RH: ABO/RH(D): B POS

## 2014-06-30 LAB — PREPARE RBC (CROSSMATCH)

## 2014-06-30 MED ORDER — SODIUM CHLORIDE 0.9 % IV SOLN: 250.0000 mL | Freq: Once | INTRAVENOUS | Status: DC

## 2014-06-30 MED ORDER — SODIUM CHLORIDE 0.9 % IJ SOLN: 3.0000 mL | INTRAMUSCULAR | Status: DC | PRN

## 2014-06-30 MED ORDER — SODIUM CHLORIDE 0.9 % IJ SOLN: 10.0000 mL | INTRAMUSCULAR | Status: DC | PRN

## 2014-06-30 MED ORDER — HEPARIN SOD (PORK) LOCK FLUSH 100 UNIT/ML IV SOLN: 500.0000 [IU] | Freq: Every day | INTRAVENOUS | Status: DC | PRN

## 2014-06-30 MED ORDER — HEPARIN SOD (PORK) LOCK FLUSH 100 UNIT/ML IV SOLN: 250.0000 [IU] | INTRAVENOUS | Status: DC | PRN

## 2014-06-30 NOTE — H&P (Signed)
Patient is here for transfusion only

## 2014-06-30 NOTE — Telephone Encounter (Signed)
Patient called stating that the copay card for Norvir and Ziagen did not pay, the only one he picked up was Prezista yesterday. I called [facility] on [address] and Darnelle Maffucci the pharmacist ran the card through again and it didn't cover it. I will talk to our patient assistance coordinator to see if there is anything available.

## 2014-06-30 NOTE — Procedures (Signed)
[facility]  Procedure Note  Dylan Barber. [id number] DOB: 10-Apr-1976 DOA: 06/30/2014   PCP: No PCP Per Patient   Associated Diagnosis: Anemia of other chronic disease   Procedure Note: Infusion of 2 units PRBCs   Condition During Procedure:  Pt tolerated well; no complications noted   Condition at Discharge:  Pt alert, oriented, ambulatory, no complications noted   Tamala Julian, Salley Slaughter, [facility]

## 2014-07-01 LAB — TYPE AND SCREEN
ABO/RH(D): B POS
ANTIBODY SCREEN: NEGATIVE
UNIT DIVISION: 0
Unit division: 0

## 2014-07-01 LAB — FLOW CYTOMETRY

## 2014-07-07 ENCOUNTER — Telehealth: Payer: Self-pay | Admitting: Hematology and Oncology

## 2014-07-07 ENCOUNTER — Encounter: Payer: Self-pay | Admitting: Hematology and Oncology

## 2014-07-07 ENCOUNTER — Other Ambulatory Visit: Payer: Self-pay | Admitting: Hematology and Oncology

## 2014-07-07 ENCOUNTER — Ambulatory Visit (HOSPITAL_BASED_OUTPATIENT_CLINIC_OR_DEPARTMENT_OTHER): Payer: Medicare Other | Admitting: Hematology and Oncology

## 2014-07-07 ENCOUNTER — Telehealth: Payer: Self-pay | Admitting: *Deleted

## 2014-07-07 VITALS — BP 126/68 | HR 76 | Temp 98.6°F | Resp 18 | Ht 66.0 in | Wt 154.3 lb

## 2014-07-07 DIAGNOSIS — B20 Human immunodeficiency virus [HIV] disease: Secondary | ICD-10-CM | POA: Diagnosis not present

## 2014-07-07 DIAGNOSIS — D649 Anemia, unspecified: Secondary | ICD-10-CM

## 2014-07-07 DIAGNOSIS — Z992 Dependence on renal dialysis: Principal | ICD-10-CM

## 2014-07-07 DIAGNOSIS — N186 End stage renal disease: Secondary | ICD-10-CM | POA: Diagnosis not present

## 2014-07-07 NOTE — Telephone Encounter (Signed)
Not adequate. He needs to take up to 500 mcg before we deem him refractory. Please see if we can call his nephrologist to increase the dose to 300 mcg, then 500 mcg

## 2014-07-07 NOTE — Assessment & Plan Note (Signed)
He will continue on hemodialysis on Mondays, Wednesdays and Fridays.

## 2014-07-07 NOTE — Assessment & Plan Note (Addendum)
There is absence of a reticulocytosis, along with the high sedimentation rate, is suggestive of anemia of chronic disease. Clinically, he does not appear to have active infection or signs to suggest malignancy. The patient is currently taking 200 mcg of darbepoetin weekly. Recommend his nephrologist to increase the dose slowly to 300 mcg and eventually to 500 mcg. If the patient continues to have lack of response to treatment, he would need staging CT scan to exclude lymphoma and also possibility a bone marrow aspirate and biopsy in the future. In the meantime, I will get him here with blood monitoring weekly and see him back next month for further assessment. I will only transfuse him if hemoglobin drops to less than 7 g. He will need 2 units of blood each time.

## 2014-07-07 NOTE — Assessment & Plan Note (Signed)
He is doing well on antiretrovirals medication. His last HIV viral load appeared to be suppressed.

## 2014-07-07 NOTE — Telephone Encounter (Signed)
gv pt appt schedule for sept/oct °

## 2014-07-07 NOTE — Telephone Encounter (Signed)
Pt reports he is getting Aranesp 200 mcg weekly on Wednesdays during Dialysis.  He asks Dr. Alvy Bimler if this is appropriate dose?

## 2014-07-07 NOTE — Progress Notes (Signed)
[facility] OFFICE PROGRESS NOTE  No PCP Per Patient SUMMARY OF HEMATOLOGIC HISTORY: This patient has end-stage renal disease and on the renal transplant list. He has been on dialysis 3 times a week since January of 2014. He has been receiving erythropoietin stimulating agents with Procrit since last year. Recently, he became profoundly anemic requiring blood transfusion. There were no signs of bleeding. His erythropoietin stimulating agents was switched to darbepoetin. The patient received platelet transfusion again on 06/30/2014. INTERVAL HISTORY: Dylan Barber. 38 y.o. male returns for further followup. His symptoms of anemia has improved since blood transfusion last week. The patient denies any recent signs or symptoms of bleeding such as spontaneous epistaxis, hematuria or hematochezia.   I have reviewed the past medical history, past surgical history, social history and family history with the patient and they are unchanged from previous note.  ALLERGIES:  is allergic to codeine; eggs or egg-derived products; heparin; and mercury.  MEDICATIONS:  Current Outpatient Prescriptions  Medication Sig Dispense Refill  . abacavir (ZIAGEN) 300 MG tablet Take 2 tablets (600 mg total) by mouth daily.  60 tablet  4  . carvedilol (COREG) 6.25 MG tablet Take 6.25 mg by mouth 2 (two) times daily with a meal.      . Darunavir Ethanolate (PREZISTA) 800 MG tablet Take 1 tablet (800 mg total) by mouth daily with breakfast.  30 tablet  4  . diphenhydrAMINE (BENADRYL) 25 MG tablet Take 25 mg by mouth every 6 (six) hours as needed for itching. During dialysis      . lamivudine (EPIVIR) 100 MG tablet Take 0.5 tablets (50 mg total) by mouth daily.  15 tablet  4  . Multiple Vitamin (MULTIVITAMIN) tablet Take 1 tablet by mouth daily.      . ritonavir (NORVIR) 100 MG TABS tablet Take 1 tablet (100 mg total) by mouth daily with breakfast.  30 tablet  4   No current facility-administered  medications for this visit.     REVIEW OF SYSTEMS:   Constitutional: Denies fevers, chills or night sweats Eyes: Denies blurriness of vision Ears, nose, mouth, throat, and face: Denies mucositis or sore throat Respiratory: Denies cough, dyspnea or wheezes Cardiovascular: Denies palpitation, chest discomfort or lower extremity swelling Gastrointestinal:  Denies nausea, heartburn or change in bowel habits Skin: Denies abnormal skin rashes Lymphatics: Denies new lymphadenopathy or easy bruising Neurological:Denies numbness, tingling or new weaknesses Behavioral/Psych: Mood is stable, no new changes  All other systems were reviewed with the patient and are negative.  PHYSICAL EXAMINATION: ECOG PERFORMANCE STATUS: 0 - Asymptomatic  Filed Vitals:   07/07/14 1056  BP: 126/68  Pulse: 76  Temp: 98.6 F (37 C)  Resp: 18   Filed Weights   07/07/14 1056  Weight: 154 lb 4.8 oz (69.99 kg)    GENERAL:alert, no distress and comfortable SKIN: skin color, texture, turgor are normal, no rashes or significant lesions EYES: normal, Conjunctiva are pale and non-injected, sclera clear Musculoskeletal:no cyanosis of digits and no clubbing  NEURO: alert & oriented x 3 with fluent speech, no focal motor/sensory deficits  LABORATORY DATA:  I have reviewed the data as listed No results found for this or any previous visit (from the past 48 hour(s)).  Lab Results  Component Value Date   WBC 7.3 06/30/2014   HGB 7.2* 06/30/2014   HCT 22.3* 06/30/2014   MCV 91.0 06/30/2014   PLT 376 06/30/2014   ASSESSMENT & PLAN:  Normocytic anemia There is absence  of a reticulocytosis, along with the high sedimentation rate, is suggestive of anemia of chronic disease. Clinically, he does not appear to have active infection or signs to suggest malignancy. The patient is currently taking 200 mcg of darbepoetin weekly. Recommend his nephrologist to increase the dose slowly to 300 mcg and eventually to 500 mcg. If  the patient continues to have lack of response to treatment, he would need staging CT scan to exclude lymphoma and also possibility a bone marrow aspirate and biopsy in the future. In the meantime, I will get him here with blood monitoring weekly and see him back next month for further assessment.  Human immunodeficiency virus (HIV) disease He is doing well on antiretrovirals medication. His last HIV viral load appeared to be suppressed.    ESRD (end stage renal disease) on dialysis He will continue on hemodialysis on Mondays, Wednesdays and Fridays.      All questions were answered. The patient knows to call the clinic with any problems, questions or concerns. No barriers to learning was detected.  I spent 15 minutes counseling the patient face to face. The total time spent in the appointment was 20 minutes and more than 50% was on counseling.     [facility], [place], MD 07/07/2014 7:51 PM

## 2014-07-08 NOTE — Telephone Encounter (Signed)
Spoke w/ RN Freda Munro at [facility]. She will give message about increasing Aranesp dose to Dr. Marval Regal on Monday.  She says pt is currently on their max dose of Aranesp per their guidelines.  She will have nurse call back after they ask MD on Monday.

## 2014-07-14 ENCOUNTER — Other Ambulatory Visit: Payer: Self-pay

## 2014-07-15 ENCOUNTER — Telehealth: Payer: Self-pay | Admitting: Hematology and Oncology

## 2014-07-15 ENCOUNTER — Other Ambulatory Visit: Payer: Self-pay | Admitting: Hematology and Oncology

## 2014-07-15 ENCOUNTER — Encounter: Payer: Self-pay | Admitting: *Deleted

## 2014-07-15 ENCOUNTER — Telehealth: Payer: Self-pay | Admitting: *Deleted

## 2014-07-15 NOTE — Telephone Encounter (Signed)
Labs added 09/08 per 09/04 POF pt aware...Marland KitchenMarland KitchenKJ

## 2014-07-15 NOTE — Telephone Encounter (Signed)
Pt at dialysis, spoke to RN taking care of patient. Hgb 6.5. Pt agrees to take 2 [facility].

## 2014-07-19 ENCOUNTER — Other Ambulatory Visit: Payer: Self-pay | Admitting: Hematology and Oncology

## 2014-07-19 ENCOUNTER — Non-Acute Institutional Stay (HOSPITAL_COMMUNITY)
Admission: AD | Admit: 2014-07-19 | Discharge: 2014-07-19 | Disposition: A | Discharge status: Home / Self Care | Payer: Medicare Other | Source: Ambulatory Visit | Attending: Hematology and Oncology | Admitting: Hematology and Oncology

## 2014-07-19 ENCOUNTER — Other Ambulatory Visit (HOSPITAL_BASED_OUTPATIENT_CLINIC_OR_DEPARTMENT_OTHER): Payer: Medicare Other

## 2014-07-19 ENCOUNTER — Ambulatory Visit: Payer: Medicare Other

## 2014-07-19 ENCOUNTER — Telehealth: Payer: Self-pay | Admitting: Hematology and Oncology

## 2014-07-19 ENCOUNTER — Ambulatory Visit (HOSPITAL_COMMUNITY)
Admission: RE | Admit: 2014-07-19 | Discharge: 2014-07-19 | Disposition: A | Discharge status: Home / Self Care | Payer: Medicare Other | Source: Ambulatory Visit | Attending: Hematology and Oncology | Admitting: Hematology and Oncology

## 2014-07-19 DIAGNOSIS — N039 Chronic nephritic syndrome with unspecified morphologic changes: Principal | ICD-10-CM

## 2014-07-19 DIAGNOSIS — D631 Anemia in chronic kidney disease: Secondary | ICD-10-CM

## 2014-07-19 DIAGNOSIS — Z21 Asymptomatic human immunodeficiency virus [HIV] infection status: Secondary | ICD-10-CM | POA: Insufficient documentation

## 2014-07-19 DIAGNOSIS — D649 Anemia, unspecified: Secondary | ICD-10-CM

## 2014-07-19 DIAGNOSIS — Z992 Dependence on renal dialysis: Secondary | ICD-10-CM | POA: Insufficient documentation

## 2014-07-19 DIAGNOSIS — N186 End stage renal disease: Secondary | ICD-10-CM

## 2014-07-19 DIAGNOSIS — I120 Hypertensive chronic kidney disease with stage 5 chronic kidney disease or end stage renal disease: Secondary | ICD-10-CM | POA: Insufficient documentation

## 2014-07-19 LAB — CBC & DIFF AND RETIC
BASO%: 0.2 % (ref 0.0–2.0)
Basophils Absolute: 0 10*3/uL (ref 0.0–0.1)
EOS%: 3.9 % (ref 0.0–7.0)
Eosinophils Absolute: 0.2 10*3/uL (ref 0.0–0.5)
HCT: 16.5 % — ABNORMAL LOW (ref 38.4–49.9)
HEMOGLOBIN: 5.4 g/dL — AB (ref 13.0–17.1)
Immature Retic Fract: 6.9 % (ref 3.00–10.60)
LYMPH%: 38.2 % (ref 14.0–49.0)
MCH: 29 pg (ref 27.2–33.4)
MCHC: 32.7 g/dL (ref 32.0–36.0)
MCV: 88.7 fL (ref 79.3–98.0)
MONO#: 0.6 10*3/uL (ref 0.1–0.9)
MONO%: 12.4 % (ref 0.0–14.0)
NEUT#: 2.3 10*3/uL (ref 1.5–6.5)
NEUT%: 45.3 % (ref 39.0–75.0)
Platelets: 306 10*3/uL (ref 140–400)
RBC: 1.86 10*6/uL — ABNORMAL LOW (ref 4.20–5.82)
RDW: 14.7 % — AB (ref 11.0–14.6)
Retic %: <0.38 % — ABNORMAL LOW (ref 0.5–1.6)
Retic Ct Abs: 2.98 10*3/uL — ABNORMAL LOW (ref 34.80–93.90)
WBC: 5.2 10*3/uL (ref 4.0–10.3)
lymph#: 2 10*3/uL (ref 0.9–3.3)

## 2014-07-19 LAB — PREPARE RBC (CROSSMATCH)

## 2014-07-19 LAB — HOLD TUBE, BLOOD BANK

## 2014-07-19 MED ORDER — HEPARIN SOD (PORK) LOCK FLUSH 100 UNIT/ML IV SOLN: 250.0000 [IU] | INTRAVENOUS | Status: DC | PRN

## 2014-07-19 MED ORDER — SODIUM CHLORIDE 0.9 % IJ SOLN: 3.0000 mL | INTRAMUSCULAR | Status: DC | PRN

## 2014-07-19 MED ORDER — SODIUM CHLORIDE 0.9 % IJ SOLN: 10.0000 mL | INTRAMUSCULAR | Status: DC | PRN

## 2014-07-19 MED ORDER — SODIUM CHLORIDE 0.9 % IV SOLN
250.0000 mL | Freq: Once | INTRAVENOUS | Status: AC
  Administered 2014-07-19: 250 mL via INTRAVENOUS

## 2014-07-19 MED ORDER — HEPARIN SOD (PORK) LOCK FLUSH 100 UNIT/ML IV SOLN: 500.0000 [IU] | Freq: Every day | INTRAVENOUS | Status: DC | PRN

## 2014-07-19 NOTE — H&P (Signed)
He is here for transfusion only

## 2014-07-19 NOTE — Telephone Encounter (Signed)
per staff msg added appts...Marland Kitchenper staff msg pt ok and aware

## 2014-07-19 NOTE — Progress Notes (Signed)
[facility]  Procedure Note  Brisco Boleyn. [id number] DOB: 08-14-1976 DOA: 07/19/2014   Ordering Physician:  Natale Lay  Associated Diagnosis: Anemia in chronic kidney disease  Procedure Note: IV initiated, Transfused 2 Units PRBC, IV discontinued    Condition During Procedure: Tolerated well, VSS, No complaints   Condition at Discharge: Tolerated Well. No complaints. VSS. Patient discharged home. Patient in no apparent distress. Instructed patient to seek medical attention if any changes in condition, patient acknowledges. Patient left day hospital with belongings ambulatory via self.   Wendie Simmer, RN  [facility]

## 2014-07-20 LAB — TYPE AND SCREEN
ABO/RH(D): B POS
Antibody Screen: NEGATIVE
Unit division: 0
Unit division: 0

## 2014-07-21 ENCOUNTER — Other Ambulatory Visit: Payer: Self-pay | Admitting: Hematology and Oncology

## 2014-07-21 ENCOUNTER — Telehealth: Payer: Self-pay | Admitting: *Deleted

## 2014-07-21 ENCOUNTER — Other Ambulatory Visit (HOSPITAL_BASED_OUTPATIENT_CLINIC_OR_DEPARTMENT_OTHER): Payer: Medicare Other

## 2014-07-21 DIAGNOSIS — Z992 Dependence on renal dialysis: Principal | ICD-10-CM

## 2014-07-21 DIAGNOSIS — N19 Unspecified kidney failure: Secondary | ICD-10-CM

## 2014-07-21 DIAGNOSIS — D649 Anemia, unspecified: Secondary | ICD-10-CM

## 2014-07-21 DIAGNOSIS — N186 End stage renal disease: Secondary | ICD-10-CM

## 2014-07-21 LAB — CBC & DIFF AND RETIC
BASO%: 0.2 % (ref 0.0–2.0)
Basophils Absolute: 0 10*3/uL (ref 0.0–0.1)
EOS%: 5.9 % (ref 0.0–7.0)
Eosinophils Absolute: 0.4 10*3/uL (ref 0.0–0.5)
HCT: 24.3 % — ABNORMAL LOW (ref 38.4–49.9)
HGB: 7.8 g/dL — ABNORMAL LOW (ref 13.0–17.1)
Immature Retic Fract: 3.8 % (ref 3.00–10.60)
LYMPH%: 32.5 % (ref 14.0–49.0)
MCH: 28.4 pg (ref 27.2–33.4)
MCHC: 32.1 g/dL (ref 32.0–36.0)
MCV: 88.4 fL (ref 79.3–98.0)
MONO#: 0.9 10*3/uL (ref 0.1–0.9)
MONO%: 14 % (ref 0.0–14.0)
NEUT#: 3.1 10*3/uL (ref 1.5–6.5)
NEUT%: 47.4 % (ref 39.0–75.0)
Platelets: 307 10*3/uL (ref 140–400)
RBC: 2.75 10*6/uL — ABNORMAL LOW (ref 4.20–5.82)
RDW: 14.7 % — ABNORMAL HIGH (ref 11.0–14.6)
RETIC CT ABS: 5.78 10*3/uL — AB (ref 34.80–93.90)
WBC: 6.4 10*3/uL (ref 4.0–10.3)
lymph#: 2.1 10*3/uL (ref 0.9–3.3)

## 2014-07-21 LAB — ERYTHROPOIETIN: Erythropoietin: 594.5 m[IU]/mL — ABNORMAL HIGH (ref 2.6–18.5)

## 2014-07-21 LAB — HOLD TUBE, BLOOD BANK

## 2014-07-21 NOTE — Telephone Encounter (Signed)
Pt had already left clinic by the time RN got his CBC results.  Lab staff reports pt got a copy of his lab work and is aware of results.  No need for transfusion today per Dr. Alvy Bimler.

## 2014-07-21 NOTE — Telephone Encounter (Signed)
Message copied by Cathlean Cower on Thu Jul 21, 2014  8:34 AM ------      Message from: [facility], [place]: Thu Jul 21, 2014  8:24 AM       No need blood today      ----- Message -----         From: [facility]         Sent: 07/21/2014   8:23 AM           To: Heath Lark, MD                   ------

## 2014-07-22 ENCOUNTER — Telehealth: Payer: Self-pay | Admitting: *Deleted

## 2014-07-22 NOTE — Telephone Encounter (Signed)
Attempted to call pt's primary cell number x 2 today and it said pt was unavailable and no VM available.  Tried to call pt's "temporary" number and it was also unavailable and no VM.  Will try again on Monday.

## 2014-07-22 NOTE — Telephone Encounter (Signed)
Message copied by Cathlean Cower on Fri Jul 22, 2014  9:57 AM ------      Message from: [facility], [place]: Thu Jul 21, 2014  5:04 PM      Regarding: test result       Hard to get you to explain to him over the phone but he has significant bone marrow failure, unlikely able to respond to higher dose of Aranesp with EPO level >500. He needs BM biopsy. Please ask him whether he wants it done under sedation. I think I'm booked next 2 weeks. I suggest CT biopsy.      ----- Message -----         From: [facility]         Sent: 07/19/2014  10:20 AM           To: Heath Lark, MD                   ------

## 2014-07-25 ENCOUNTER — Telehealth: Payer: Self-pay | Admitting: *Deleted

## 2014-07-25 NOTE — Telephone Encounter (Signed)
Attempting to reach pt to discuss Dr. Alvy Bimler recommends he have a Bone Marrow Biopsy done soon due to bone marrow failure not responding to Aranesp..  I called both his numbers listed and they both say that Person is "not accepting calls at this time" and there is not voice mail to leave pt a message.  Will continue to try calling again tomorrow.

## 2014-07-27 ENCOUNTER — Telehealth: Payer: Self-pay | Admitting: *Deleted

## 2014-07-27 NOTE — Telephone Encounter (Signed)
Attempted to call pt again and both his numbers are listed as unavailalbe. Unable to leave VM.  Dr. Alvy Bimler recommends he have a BMBX and I am trying to reach him to get this arranged.  Have called pt for four days and no answer.  He is scheduled for lab here tomorrow at 8:45 am.  Will attempt to speak w/ pt tomorrow morning when he is here for his lab appt.Marland Kitchen

## 2014-07-28 ENCOUNTER — Telehealth: Payer: Self-pay | Admitting: *Deleted

## 2014-07-28 ENCOUNTER — Other Ambulatory Visit (HOSPITAL_BASED_OUTPATIENT_CLINIC_OR_DEPARTMENT_OTHER): Payer: Medicare Other

## 2014-07-28 ENCOUNTER — Encounter (HOSPITAL_COMMUNITY): Payer: Self-pay | Admitting: Pharmacy Technician

## 2014-07-28 ENCOUNTER — Other Ambulatory Visit: Payer: Self-pay | Admitting: Hematology and Oncology

## 2014-07-28 DIAGNOSIS — Z992 Dependence on renal dialysis: Principal | ICD-10-CM

## 2014-07-28 DIAGNOSIS — N186 End stage renal disease: Secondary | ICD-10-CM

## 2014-07-28 DIAGNOSIS — D649 Anemia, unspecified: Secondary | ICD-10-CM

## 2014-07-28 LAB — CBC & DIFF AND RETIC
BASO%: 0.2 % (ref 0.0–2.0)
Basophils Absolute: 0 10*3/uL (ref 0.0–0.1)
EOS%: 5 % (ref 0.0–7.0)
Eosinophils Absolute: 0.2 10*3/uL (ref 0.0–0.5)
HCT: 22.1 % — ABNORMAL LOW (ref 38.4–49.9)
HGB: 7.1 g/dL — ABNORMAL LOW (ref 13.0–17.1)
Immature Retic Fract: 2.2 % — ABNORMAL LOW (ref 3.00–10.60)
LYMPH#: 1.7 10*3/uL (ref 0.9–3.3)
LYMPH%: 37.5 % (ref 14.0–49.0)
MCH: 28.4 pg (ref 27.2–33.4)
MCHC: 32.1 g/dL (ref 32.0–36.0)
MCV: 88.4 fL (ref 79.3–98.0)
MONO#: 0.7 10*3/uL (ref 0.1–0.9)
MONO%: 16.1 % — ABNORMAL HIGH (ref 0.0–14.0)
NEUT#: 1.8 10*3/uL (ref 1.5–6.5)
NEUT%: 41.2 % (ref 39.0–75.0)
NRBC: 0 % (ref 0–0)
Platelets: 209 10*3/uL (ref 140–400)
RBC: 2.5 10*6/uL — AB (ref 4.20–5.82)
RDW: 14 % (ref 11.0–14.6)
RETIC CT ABS: 4.75 10*3/uL — AB (ref 34.80–93.90)
Retic %: <0.38 % — ABNORMAL LOW (ref 0.5–1.6)
WBC: 4.4 10*3/uL (ref 4.0–10.3)

## 2014-07-28 LAB — HOLD TUBE, BLOOD BANK

## 2014-07-28 NOTE — Telephone Encounter (Signed)
Pt confirmed his phone numbers listed w/ Kim in Lab this morning.  He is agreeable to be scheduled for BMBX.  He will erase some messages on his phone to see if this helps [place] to get through.  Order for BMBx in IR placed by Dr. Alvy Bimler.  They will contact pt for appt.Dylan Barber

## 2014-07-29 ENCOUNTER — Other Ambulatory Visit: Payer: Self-pay | Admitting: Hematology and Oncology

## 2014-07-29 ENCOUNTER — Telehealth: Payer: Self-pay | Admitting: *Deleted

## 2014-07-29 NOTE — Telephone Encounter (Signed)
Pt scheduled for BMBx in IR on 9/24 and also scheduled for lab here at same time.  S/w [facility] [place] and asked them to please draw CBC in IR  and call us if Hgb less than 7.0.   She agreed.

## 2014-08-01 ENCOUNTER — Other Ambulatory Visit: Payer: Self-pay | Admitting: Radiology

## 2014-08-03 ENCOUNTER — Other Ambulatory Visit: Payer: Self-pay | Admitting: Radiology

## 2014-08-04 ENCOUNTER — Other Ambulatory Visit: Payer: Self-pay | Admitting: *Deleted

## 2014-08-04 ENCOUNTER — Telehealth: Payer: Self-pay | Admitting: *Deleted

## 2014-08-04 ENCOUNTER — Ambulatory Visit (HOSPITAL_COMMUNITY)
Admission: RE | Admit: 2014-08-04 | Discharge: 2014-08-04 | Disposition: A | Discharge status: Home / Self Care | Payer: Medicare Other | Source: Ambulatory Visit | Attending: Hematology and Oncology | Admitting: Hematology and Oncology

## 2014-08-04 ENCOUNTER — Encounter (HOSPITAL_COMMUNITY): Payer: Self-pay

## 2014-08-04 ENCOUNTER — Telehealth: Payer: Self-pay | Admitting: Hematology and Oncology

## 2014-08-04 ENCOUNTER — Other Ambulatory Visit: Payer: Self-pay

## 2014-08-04 ENCOUNTER — Non-Acute Institutional Stay (HOSPITAL_COMMUNITY)
Admission: AD | Admit: 2014-08-04 | Discharge: 2014-08-04 | Disposition: A | Discharge status: Home / Self Care | Payer: Medicare Other | Source: Ambulatory Visit | Attending: Hematology and Oncology | Admitting: Hematology and Oncology

## 2014-08-04 VITALS — BP 107/55 | HR 91 | Temp 97.8°F | Resp 16 | Ht 67.0 in | Wt 152.1 lb

## 2014-08-04 DIAGNOSIS — B20 Human immunodeficiency virus [HIV] disease: Secondary | ICD-10-CM | POA: Insufficient documentation

## 2014-08-04 DIAGNOSIS — D649 Anemia, unspecified: Secondary | ICD-10-CM | POA: Insufficient documentation

## 2014-08-04 DIAGNOSIS — N186 End stage renal disease: Secondary | ICD-10-CM

## 2014-08-04 DIAGNOSIS — D72822 Plasmacytosis: Secondary | ICD-10-CM | POA: Diagnosis not present

## 2014-08-04 DIAGNOSIS — Z992 Dependence on renal dialysis: Secondary | ICD-10-CM | POA: Insufficient documentation

## 2014-08-04 LAB — BONE MARROW EXAM

## 2014-08-04 LAB — CBC
HCT: 19 % — ABNORMAL LOW (ref 39.0–52.0)
HEMOGLOBIN: 6.3 g/dL — AB (ref 13.0–17.0)
MCH: 29 pg (ref 26.0–34.0)
MCHC: 33.2 g/dL (ref 30.0–36.0)
MCV: 87.6 fL (ref 78.0–100.0)
Platelets: 395 10*3/uL (ref 150–400)
RBC: 2.17 MIL/uL — AB (ref 4.22–5.81)
RDW: 14.2 % (ref 11.5–15.5)
WBC: 4.8 10*3/uL (ref 4.0–10.5)

## 2014-08-04 LAB — APTT: aPTT: 33 seconds (ref 24–37)

## 2014-08-04 LAB — PREPARE RBC (CROSSMATCH)

## 2014-08-04 LAB — PROTIME-INR
INR: 1.02 (ref 0.00–1.49)
PROTHROMBIN TIME: 13.4 s (ref 11.6–15.2)

## 2014-08-04 MED ORDER — FENTANYL CITRATE 0.05 MG/ML IJ SOLN
INTRAMUSCULAR | Status: AC
  Filled 2014-08-04: qty 6

## 2014-08-04 MED ORDER — FENTANYL CITRATE 0.05 MG/ML IJ SOLN
INTRAMUSCULAR | Status: AC | PRN
  Administered 2014-08-04 (×2): 50 ug via INTRAVENOUS

## 2014-08-04 MED ORDER — MIDAZOLAM HCL 2 MG/2ML IJ SOLN
INTRAMUSCULAR | Status: AC
  Filled 2014-08-04: qty 6

## 2014-08-04 MED ORDER — SODIUM CHLORIDE 0.9 % IJ SOLN: 3.0000 mL | INTRAMUSCULAR | Status: DC | PRN

## 2014-08-04 MED ORDER — MIDAZOLAM HCL 2 MG/2ML IJ SOLN
INTRAMUSCULAR | Status: AC | PRN
  Administered 2014-08-04 (×3): 1 mg via INTRAVENOUS

## 2014-08-04 MED ORDER — SODIUM CHLORIDE 0.9 % IV SOLN: 250.0000 mL | Freq: Once | INTRAVENOUS | Status: DC

## 2014-08-04 MED ORDER — SODIUM CHLORIDE 0.9 % IV SOLN
INTRAVENOUS | Status: DC
  Administered 2014-08-04: 08:00:00 via INTRAVENOUS

## 2014-08-04 MED ORDER — ACETAMINOPHEN 325 MG PO TABS
650.0000 mg | ORAL_TABLET | Freq: Once | ORAL | Status: AC
  Administered 2014-08-04: 650 mg via ORAL
  Filled 2014-08-04: qty 2

## 2014-08-04 MED ORDER — DIPHENHYDRAMINE HCL 25 MG PO CAPS
25.0000 mg | ORAL_CAPSULE | Freq: Once | ORAL | Status: AC
  Administered 2014-08-04: 25 mg via ORAL
  Filled 2014-08-04: qty 1

## 2014-08-04 NOTE — Discharge Instructions (Signed)
Anemia, Nonspecific Anemia is a condition in which the concentration of red blood cells or hemoglobin in the blood is below normal. Hemoglobin is a substance in red blood cells that carries oxygen to the tissues of the body. Anemia results in not enough oxygen reaching these tissues.  CAUSES  Common causes of anemia include:   Excessive bleeding. Bleeding may be internal or external. This includes excessive bleeding from periods (in women) or from the intestine.   Poor nutrition.   Chronic kidney, thyroid, and liver disease.  Bone marrow disorders that decrease red blood cell production.  Cancer and treatments for cancer.  HIV, AIDS, and their treatments.  Spleen problems that increase red blood cell destruction.  Blood disorders.  Excess destruction of red blood cells due to infection, medicines, and autoimmune disorders. SIGNS AND SYMPTOMS   Minor weakness.   Dizziness.   Headache.  Palpitations.   Shortness of breath, especially with exercise.   Paleness.  Cold sensitivity.  Indigestion.  Nausea.  Difficulty sleeping.  Difficulty concentrating. Symptoms may occur suddenly or they may develop slowly.  DIAGNOSIS  Additional blood tests are often needed. These help your health care provider determine the best treatment. Your health care provider will check your stool for blood and look for other causes of blood loss.  TREATMENT  Treatment varies depending on the cause of the anemia. Treatment can include:   Supplements of iron, vitamin B12, or folic acid.   Hormone medicines.   A blood transfusion. This may be needed if blood loss is severe.   Hospitalization. This may be needed if there is significant continual blood loss.   Dietary changes.  Spleen removal. HOME CARE INSTRUCTIONS Keep all follow-up appointments. It often takes many weeks to correct anemia, and having your health care provider check on your condition and your response to  treatment is very important. SEEK IMMEDIATE MEDICAL CARE IF:   You develop extreme weakness, shortness of breath, or chest pain.   You become dizzy or have trouble concentrating.  You develop heavy vaginal bleeding.   You develop a rash.   You have bloody or black, tarry stools.   You faint.   You vomit up blood.   You vomit repeatedly.   You have abdominal pain.  You have a fever or persistent symptoms for more than 2-3 days.   You have a fever and your symptoms suddenly get worse.   You are dehydrated.  MAKE SURE YOU:  Understand these instructions.  Will watch your condition.  Will get help right away if you are not doing well or get worse. Document Released: 12/05/2004 Document Revised: 06/30/2013 Document Reviewed: 04/23/2013 ExitCare Patient Information 2015 [employer]. This information is not intended to replace advice given to you by your health care provider. Make sure you discuss any questions you have with your health care provider.  

## 2014-08-04 NOTE — H&P (Signed)
Chief Complaint: ESRD;CKD HIV Worsened anemia requiring transfusion   Referring Physician(s): Gorsuch,Ni  History of Present Illness: Dylan Barber. is a 38 y.o. male  Pt with known ESRD--on transplant list HIV Anemia of chronic disease Worsened anemia requiring transfusion Now scheduled for Bone Marrow Biopsy per Dr Alvy Bimler    Past Medical History  Diagnosis Date  . Hypertension   . Dialysis patient   . Renal disorder   . HIV disease   . Anemia   . Seizure   . Heart murmur   . Lymphocytosis 06/16/2014    Past Surgical History  Procedure Laterality Date  . Av fistula placement Left 05/07/2013    Procedure: ARTERIOVENOUS (AV) FISTULA CREATION- LEFT;  Surgeon: Rosetta Posner, MD;  Location: [facility];  Service: Vascular;  Laterality: Left;  . Insertion of dialysis catheter      x 2  . Av fistula placement Left 08/12/2013    Procedure: ARTERIOVENOUS (AV) FISTULA CREATION BRACHIOCEPHALIC;  Surgeon: Angelia Mould, MD;  Location: [facility];  Service: Vascular;  Laterality: Left;  . Ligation of arteriovenous  fistula Left 08/12/2013    Procedure: LIGATION OF ARTERIOVENOUS  FISTULA RADIOCEPHALIC;  Surgeon: Angelia Mould, MD;  Location: [facility];  Service: Vascular;  Laterality: Left;    Allergies: Codeine; Eggs or egg-derived products; Heparin; and Mercury  Medications: Prior to Admission medications   Medication Sig Start Date End Date Taking? Authorizing Provider  abacavir (ZIAGEN) 300 MG tablet Take 600 mg by mouth daily at 12 noon.   Yes Historical Provider, MD  carvedilol (COREG) 6.25 MG tablet Take 6.25 mg by mouth daily at 12 noon.   Yes Historical Provider, MD  Darunavir Ethanolate (PREZISTA) 800 MG tablet Take 800 mg by mouth daily at 12 noon.   Yes Historical Provider, MD  Multiple Vitamin (MULTIVITAMIN) tablet Take 1 tablet by mouth daily.   Yes Historical Provider, MD  ritonavir (NORVIR) 100 MG capsule Take 100 mg by mouth daily at 12 noon.   Yes  Historical Provider, MD  diphenhydrAMINE (BENADRYL) 25 MG tablet Take 25 mg by mouth every 6 (six) hours as needed for itching. During dialysis    Historical Provider, MD  lamivudine (EPIVIR) 100 MG tablet Take 50 mg by mouth daily. On dialysis days: Mon, Wed, and Fri takes after 5 pm when dialysis treatment is finished. Tuesdays, thurs, sat, and Sunday takes between 12-1 pm.    Historical Provider, MD    Family History  Problem Relation Age of Onset  . Kidney failure Mother   . Kidney disease Mother   . Kidney failure Maternal Uncle   . Kidney disease Paternal Uncle   . Kidney disease Maternal Grandfather     History   Social History  . Marital Status: Single    Spouse Name: N/A    Number of Children: N/A  . Years of Education: N/A   Social History Main Topics  . Smoking status: Never Smoker   . Smokeless tobacco: Never Used  . Alcohol Use: No  . Drug Use: No  . Sexual Activity: Not Currently    Partners: Female    Birth Control/ Protection: Condom     Comment: pt. given condoms   Other Topics Concern  . None   Social History Narrative   Originally from [place],  States father worked at [employer].  Moved from [place], [place]. On 6/21 to live with father.     Review of Systems: A 12 point  ROS discussed and pertinent positives are indicated in the HPI above.  All other systems are negative.  Review of Systems  Constitutional: Positive for fatigue.  Respiratory: Negative for cough, chest tightness and shortness of breath.   Cardiovascular: Negative for chest pain.  Gastrointestinal: Negative for nausea, vomiting and abdominal pain.  Musculoskeletal: Negative for back pain and joint swelling.  Skin: Negative for color change.  Neurological: Negative for headaches.  Psychiatric/Behavioral: Negative for confusion.    Vital Signs: BP 112/60  Pulse 91  Temp(Src) 97.8 F (36.6 C) (Oral)  Resp 16  Ht _0  (1.702 m)  Wt 69 kg (152 lb 1.9 oz)  BMI 23.82 kg/m2  SpO2  100%  Physical Exam  Constitutional: He is oriented to person, place, and time. He appears well-nourished.  Cardiovascular: Normal rate and regular rhythm.   No murmur heard. Pulmonary/Chest: Effort normal and breath sounds normal. No respiratory distress. He has no wheezes.  Abdominal: Soft. Bowel sounds are normal. He exhibits no distension.  Musculoskeletal: Normal range of motion.  Neurological: He is alert and oriented to person, place, and time.  Skin: Skin is warm and dry.  Psychiatric: He has a normal mood and affect. His behavior is normal. Judgment and thought content normal.    Imaging: No results found.  Labs: Lab Results  Component Value Date   WBC 4.8 08/04/2014   HCT 19.0* 08/04/2014   MCV 87.6 08/04/2014   PLT 395 08/04/2014   NA 140 06/28/2014   K 3.2* 06/28/2014   CL 97 05/15/2014   CO2 32* 06/28/2014   GLUCOSE 87 06/28/2014   BUN 13.3 06/28/2014   CREATININE 8.6* 06/28/2014   CALCIUM 8.5 06/28/2014   PROT 8.3 06/28/2014   ALBUMIN 3.2* 06/28/2014   AST 24 06/28/2014   ALT 16 06/28/2014   ALKPHOS 136 06/28/2014   BILITOT 0.34 06/28/2014   GFRNONAA 12* 05/15/2014   GFRAA 14* 05/15/2014   INR 1.02 08/04/2014    Assessment and Plan:  Anemia of chronic disease ESRD; HIV On renal transplant list Recent worsened anemia requiring transfusion Scheduled for BM BX now Pt aware of procedure benefits and risks and agreeable to proceed Consent signed andin chart  Thank you for this interesting consult.  I greatly enjoyed meeting Dylan Barber. and look forward to participating in their care.     I spent a total of 20 minutes face to face in clinical consultation, greater than 50% of which was counseling/coordinating care for BM BX  Signed: Aasir Daigler A 08/04/2014, 8:12 AM

## 2014-08-04 NOTE — Progress Notes (Signed)
Received phone call from Dr. Calton Dach RN. Patient to receive two units of blood today at [facility] at noon today. D/c time from here post radiology procedure is 1130. Will type and crossmatch now here in Short Stay.

## 2014-08-04 NOTE — H&P (Signed)
He is here for transfusion only

## 2014-08-04 NOTE — Progress Notes (Signed)
Nursing post IR CT Bone Marrow biopsy - Discharge teaching given to Dylan Barber, who will be caring for patient after blood transfusion at sickle cell clinic. Report also given to sickle cell clinic RN Corah Bushaell.  Pt transported via wheelchair to [facility] clinic.

## 2014-08-04 NOTE — Telephone Encounter (Signed)
s.w. pt and advised on added slab in OCT....Marland Kitchenpt ok and aware

## 2014-08-04 NOTE — Progress Notes (Signed)
Pt informed Nurse in Sickle Cell clinic that he developed some hives at home after his last transfusion.  He did not notify this office.  Pre meds,  tylenol and benadryl ordered per Dr. Alvy Bimler.

## 2014-08-04 NOTE — Discharge Instructions (Signed)
Conscious Sedation °Sedation is the use of medicines to promote relaxation and relieve discomfort and anxiety. Conscious sedation is a type of sedation. Under conscious sedation you are less alert than normal but are still able to respond to instructions or stimulation. Conscious sedation is used during short medical and dental procedures. It is milder than deep sedation or general anesthesia and allows you to return to your regular activities sooner.  °LET YOUR HEALTH CARE PROVIDER KNOW ABOUT:  °· Any allergies you have. °· All medicines you are taking, including vitamins, herbs, eye drops, creams, and over-the-counter medicines. °· Use of steroids (by mouth or creams). °· Previous problems you or members of your family have had with the use of anesthetics. °· Any blood disorders you have. °· Previous surgeries you have had. °· Medical conditions you have. °· Possibility of pregnancy, if this applies. °· Use of cigarettes, alcohol, or illegal drugs. °RISKS AND COMPLICATIONS °Generally, this is a safe procedure. However, as with any procedure, problems can occur. Possible problems include: °· Oversedation. °· Trouble breathing on your own. You may need to have a breathing tube until you are awake and breathing on your own. °· Allergic reaction to any of the medicines used for the procedure. °BEFORE THE PROCEDURE °· You may have blood tests done. These tests can help show how well your kidneys and liver are working. They can also show how well your blood clots. °· A physical exam will be done.   °· Only take medicines as directed by your health care provider. You may need to stop taking medicines (such as blood thinners, aspirin, or nonsteroidal anti-inflammatory drugs) before the procedure.   °· Do not eat or drink at least 6 hours before the procedure or as directed by your health care provider. °· Arrange for a responsible adult, family member, or friend to take you home after the procedure. He or she should stay  with you for at least 24 hours after the procedure, until the medicine has worn off. °PROCEDURE  °· An intravenous (IV) catheter will be inserted into one of your veins. Medicine will be able to flow directly into your body through this catheter. You may be given medicine through this tube to help prevent pain and help you relax. °· The medical or dental procedure will be done. °AFTER THE PROCEDURE °· You will stay in a recovery area until the medicine has worn off. Your blood pressure and pulse will be checked.   °·  Depending on the procedure you had, you may be allowed to go home when you can tolerate liquids and your pain is under control. °Document Released: 07/23/2001 Document Revised: 11/02/2013 Document Reviewed: 07/05/2013 °ExitCare® Patient Information ©2015 [employer]. This information is not intended to replace advice given to you by your health care provider. Make sure you discuss any questions you have with your health care provider. ° °Bone Marrow Aspiration, Bone Marrow Biopsy °Care After °Read the instructions outlined below and refer to this sheet in the next few weeks. These discharge instructions provide you with general information on caring for yourself after you leave the hospital. Your caregiver may also give you specific instructions. While your treatment has been planned according to the most current medical practices available, unavoidable complications occasionally occur. If you have any problems or questions after discharge, call your caregiver. °FINDING OUT THE RESULTS OF YOUR TEST °Not all test results are available during your visit. If your test results are not back during the visit, make an appointment with   your caregiver to find out the results. Do not assume everything is normal if you have not heard from your caregiver or the medical facility. It is important for you to follow up on all of your test results.  °HOME CARE INSTRUCTIONS  °You have had sedation and may be sleepy or  dizzy. Your thinking may not be as clear as usual. For the next 24 hours: °· Only take over-the-counter or prescription medicines for pain, discomfort, and or fever as directed by your caregiver. °· Do not drink alcohol. °· Do not smoke. °· Do not drive. °· Do not make important legal decisions. °· Do not operate heavy machinery. °· Do not care for small children by yourself. °· Keep your dressing clean and dry. You may replace dressing with a bandage after 24 hours. °· You may take a bath or shower after 24 hours. °· Use an ice pack for 20 minutes every 2 hours while awake for pain as needed. °SEEK MEDICAL CARE IF:  °· There is redness, swelling, or increasing pain at the biopsy site. °· There is pus coming from the biopsy site. °· There is drainage from a biopsy site lasting longer than one day. °· An unexplained oral temperature above 102° F (38.9° C) develops. °SEEK IMMEDIATE MEDICAL CARE IF:  °· You develop a rash. °· You have difficulty breathing. °· You develop any reaction or side effects to medications given. °Document Released: 05/17/2005 Document Revised: 01/20/2012 Document Reviewed: 10/25/2008 °ExitCare® Patient Information ©2015 [employer]. This information is not intended to replace advice given to you by your health care provider. Make sure you discuss any questions you have with your health care provider. ° °

## 2014-08-04 NOTE — Progress Notes (Signed)
Patient's hemoglobin this am  6.3. Jannifer Franklin PA made aware.

## 2014-08-04 NOTE — Procedures (Signed)
Successful RT ILIAC BM ASP AND CORE BX No comp Stable Path pending Full report in PACS

## 2014-08-04 NOTE — Procedures (Signed)
[facility]  Procedure Note  Dylan Barber. UW:[id number] DOB: 1976/06/17 DOA: 08/04/2014   PCP: No PCP Per Patient   Associated Diagnosis: Anemia, unspecified   Procedure Note: Transfusion of 2 units PRBCs   Condition During Procedure: Pt tolerated well   Condition at Discharge: Pt alert, ambulatory, no complications noted   Tamala Julian, Salley Slaughter, [facility]

## 2014-08-04 NOTE — Telephone Encounter (Signed)
Received call from Baxter Flattery at [facility], [phone].  She reports pt's hgb there drawn yesterday was 5.5.   He got Aranesp 200 mcg yesterday.  Informed her pt's Hgb here today is 6.3 and pt is receiving 2 units of blood today.

## 2014-08-04 NOTE — Telephone Encounter (Signed)
Hgb today is 6.4.  Pt recovering from BMBx at Short Stay.  They do not have room to transfuse pt today. [facility] does not have room to transfuse either.  Arranged for pt to have blood transfusion, 2 units per Dr. Alvy Bimler, at [facility]. S/w Charlene at Sickle Cell and they can take pt at noon.  Informed Nurse, Manuela Schwartz, at Short Stay and she will draw type and cross there.  They will take pt over to Sickle Cell for the transfusion when he is d/c'd from Short Stay.

## 2014-08-05 LAB — TYPE AND SCREEN
ABO/RH(D): B POS
ANTIBODY SCREEN: NEGATIVE
UNIT DIVISION: 0
UNIT DIVISION: 0
Unit division: 0
Unit division: 0

## 2014-08-10 ENCOUNTER — Telehealth: Payer: Self-pay | Admitting: *Deleted

## 2014-08-10 ENCOUNTER — Other Ambulatory Visit: Payer: Self-pay | Admitting: Hematology and Oncology

## 2014-08-10 DIAGNOSIS — D649 Anemia, unspecified: Secondary | ICD-10-CM

## 2014-08-10 DIAGNOSIS — D601 Transient acquired pure red cell aplasia: Secondary | ICD-10-CM | POA: Insufficient documentation

## 2014-08-10 DIAGNOSIS — Z992 Dependence on renal dialysis: Principal | ICD-10-CM

## 2014-08-10 DIAGNOSIS — B343 Parvovirus infection, unspecified: Secondary | ICD-10-CM | POA: Insufficient documentation

## 2014-08-10 DIAGNOSIS — N186 End stage renal disease: Secondary | ICD-10-CM

## 2014-08-10 NOTE — Telephone Encounter (Signed)
Left pt VM x 2 informing him of schedule change tomorrow.  Informed him of appt at 8 am tomorrow.  He did call back and left VM while he was at dialysis today.  This RN called him back again and got his VM so left another message informing him of 8 am appt time.

## 2014-08-10 NOTE — Telephone Encounter (Signed)
Per staff phone call from desk RN I have schedueld appts. Desk RN to call patient .  JMW

## 2014-08-11 ENCOUNTER — Other Ambulatory Visit (HOSPITAL_BASED_OUTPATIENT_CLINIC_OR_DEPARTMENT_OTHER): Payer: Medicare Other

## 2014-08-11 ENCOUNTER — Ambulatory Visit (HOSPITAL_BASED_OUTPATIENT_CLINIC_OR_DEPARTMENT_OTHER): Payer: Medicare Other | Admitting: Hematology and Oncology

## 2014-08-11 ENCOUNTER — Ambulatory Visit (HOSPITAL_BASED_OUTPATIENT_CLINIC_OR_DEPARTMENT_OTHER): Payer: Medicare Other

## 2014-08-11 ENCOUNTER — Encounter: Payer: Self-pay | Admitting: Hematology and Oncology

## 2014-08-11 ENCOUNTER — Ambulatory Visit: Payer: Medicare Other

## 2014-08-11 ENCOUNTER — Ambulatory Visit (HOSPITAL_COMMUNITY)
Admission: RE | Admit: 2014-08-11 | Discharge: 2014-08-11 | Disposition: A | Discharge status: Home / Self Care | Payer: Medicare Other | Source: Ambulatory Visit | Attending: Hematology and Oncology | Admitting: Hematology and Oncology

## 2014-08-11 ENCOUNTER — Telehealth: Payer: Self-pay | Admitting: Hematology and Oncology

## 2014-08-11 VITALS — BP 127/68 | HR 76 | Temp 98.6°F | Resp 18 | Ht 67.0 in | Wt 158.6 lb

## 2014-08-11 VITALS — BP 156/94 | HR 79 | Temp 98.5°F | Resp 18

## 2014-08-11 DIAGNOSIS — Z992 Dependence on renal dialysis: Secondary | ICD-10-CM | POA: Diagnosis not present

## 2014-08-11 DIAGNOSIS — D649 Anemia, unspecified: Secondary | ICD-10-CM | POA: Insufficient documentation

## 2014-08-11 DIAGNOSIS — B343 Parvovirus infection, unspecified: Secondary | ICD-10-CM

## 2014-08-11 DIAGNOSIS — D601 Transient acquired pure red cell aplasia: Secondary | ICD-10-CM

## 2014-08-11 DIAGNOSIS — N186 End stage renal disease: Secondary | ICD-10-CM

## 2014-08-11 DIAGNOSIS — B20 Human immunodeficiency virus [HIV] disease: Secondary | ICD-10-CM

## 2014-08-11 LAB — CBC & DIFF AND RETIC
BASO%: 0 % (ref 0.0–2.0)
Basophils Absolute: 0 10*3/uL (ref 0.0–0.1)
EOS%: 5.7 % (ref 0.0–7.0)
Eosinophils Absolute: 0.3 10*3/uL (ref 0.0–0.5)
HEMATOCRIT: 22.4 % — AB (ref 38.4–49.9)
HGB: 7.1 g/dL — ABNORMAL LOW (ref 13.0–17.1)
IMMATURE RETIC FRACT: 2.2 % — AB (ref 3.00–10.60)
LYMPH%: 43.5 % (ref 14.0–49.0)
MCH: 28 pg (ref 27.2–33.4)
MCHC: 31.7 g/dL — AB (ref 32.0–36.0)
MCV: 88.2 fL (ref 79.3–98.0)
MONO#: 0.7 10*3/uL (ref 0.1–0.9)
MONO%: 16.3 % — ABNORMAL HIGH (ref 0.0–14.0)
NEUT#: 1.5 10*3/uL (ref 1.5–6.5)
NEUT%: 34.5 % — AB (ref 39.0–75.0)
Platelets: 267 10*3/uL (ref 140–400)
RBC: 2.54 10*6/uL — ABNORMAL LOW (ref 4.20–5.82)
RDW: 14.6 % (ref 11.0–14.6)
RETIC CT ABS: 4.83 10*3/uL — AB (ref 34.80–93.90)
Retic %: <0.38 % — ABNORMAL LOW (ref 0.5–1.6)
WBC: 4.4 10*3/uL (ref 4.0–10.3)
lymph#: 1.9 10*3/uL (ref 0.9–3.3)
nRBC: 0 % (ref 0–0)

## 2014-08-11 LAB — HOLD TUBE, BLOOD BANK

## 2014-08-11 MED ORDER — IMMUNE GLOBULIN (HUMAN) 10 GM/100ML IV SOLN
140.0000 g | Freq: Once | INTRAVENOUS | Status: AC
  Administered 2014-08-11: 140 g via INTRAVENOUS
  Filled 2014-08-11: qty 1400

## 2014-08-11 MED ORDER — ACETAMINOPHEN 325 MG PO TABS
650.0000 mg | ORAL_TABLET | Freq: Four times a day (QID) | ORAL | Status: DC | PRN
  Administered 2014-08-11: 650 mg via ORAL

## 2014-08-11 MED ORDER — DIPHENHYDRAMINE HCL 25 MG PO TABS
25.0000 mg | ORAL_TABLET | ORAL | Status: DC | PRN
  Administered 2014-08-11: 25 mg via ORAL
  Filled 2014-08-11: qty 1

## 2014-08-11 MED ORDER — ACETAMINOPHEN 325 MG PO TABS
ORAL_TABLET | ORAL | Status: AC
  Filled 2014-08-11: qty 2

## 2014-08-11 MED ORDER — DIPHENHYDRAMINE HCL 25 MG PO CAPS
ORAL_CAPSULE | ORAL | Status: AC
  Filled 2014-08-11: qty 1

## 2014-08-11 MED ORDER — SODIUM CHLORIDE 0.9 % IV SOLN
Freq: Once | INTRAVENOUS | Status: AC
  Administered 2014-08-11: 10:00:00 via INTRAVENOUS

## 2014-08-11 NOTE — Progress Notes (Signed)
[facility] OFFICE PROGRESS NOTE  No PCP Per Patient SUMMARY OF HEMATOLOGIC HISTORY: This patient has end-stage renal disease and on the renal transplant list. He has been on dialysis 3 times a week since January of 2014. He has been receiving erythropoietin stimulating agents with Procrit since last year. Recently, he became profoundly anemic requiring blood transfusion. There were no signs of bleeding. His erythropoietin stimulating agents was switched to darbepoetin. The patient received multiple units of blood transfusion from August to September 2015. 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 INTERVAL HISTORY: Dylan Barber. 38 y.o. male returns for further followup. He complained of mild fatigue. Denies any chest pain or shortness of breath. The patient denies any recent signs or symptoms of bleeding such as spontaneous epistaxis, hematuria or hematochezia.  I have reviewed the past medical history, past surgical history, social history and family history with the patient and they are unchanged from previous note.  ALLERGIES:  is allergic to codeine; eggs or egg-derived products; heparin; and mercury.  MEDICATIONS:  Current Outpatient Prescriptions  Medication Sig Dispense Refill  . abacavir (ZIAGEN) 300 MG tablet Take 600 mg by mouth daily at 12 noon.      . carvedilol (COREG) 6.25 MG tablet Take 6.25 mg by mouth daily at 12 noon.      . Darunavir Ethanolate (PREZISTA) 800 MG tablet Take 800 mg by mouth daily at 12 noon.      . diphenhydrAMINE (BENADRYL) 25 MG tablet Take 25 mg by mouth every 6 (six) hours as needed for itching. During dialysis      . lamivudine (EPIVIR) 100 MG tablet Take 50 mg by mouth daily. On dialysis days: Mon, Wed, and Fri takes after 5 pm when dialysis treatment is finished. Tuesdays, thurs, sat, and Sunday takes between 12-1 pm.      . Multiple Vitamin  (MULTIVITAMIN) tablet Take 1 tablet by mouth daily.      . ritonavir (NORVIR) 100 MG capsule Take 100 mg by mouth daily at 12 noon.       No current facility-administered medications for this visit.     REVIEW OF SYSTEMS:   Constitutional: Denies fevers, chills or night sweats Eyes: Denies blurriness of vision Ears, nose, mouth, throat, and face: Denies mucositis or sore throat Respiratory: Denies cough, dyspnea or wheezes Cardiovascular: Denies palpitation, chest discomfort or lower extremity swelling Gastrointestinal:  Denies nausea, heartburn or change in bowel habits Skin: Denies abnormal skin rashes Lymphatics: Denies new lymphadenopathy or easy bruising Neurological:Denies numbness, tingling or new weaknesses Behavioral/Psych: Mood is stable, no new changes  All other systems were reviewed with the patient and are negative.  PHYSICAL EXAMINATION: ECOG PERFORMANCE STATUS: 0 - Asymptomatic  Filed Vitals:   08/11/14 0827  BP: 127/68  Pulse: 76  Temp: 98.6 F (37 C)  Resp: 18   Filed Weights   08/11/14 0827  Weight: 158 lb 9.6 oz (71.94 kg)    GENERAL:alert, no distress and comfortable SKIN: skin color, texture, turgor are normal, no rashes or significant lesions EYES: normal, Conjunctiva are pink and non-injected, sclera clear OROPHARYNX:no exudate, no erythema and lips, buccal mucosa, and tongue normal  NECK: supple, thyroid normal size, non-tender, without nodularity LYMPH:  no palpable lymphadenopathy in the cervical, axillary or inguinal LUNGS: clear to auscultation and percussion with normal breathing effort HEART: regular rate & rhythm and no murmurs and no lower extremity edema ABDOMEN:abdomen soft,  non-tender and normal bowel sounds Musculoskeletal:no cyanosis of digits and no clubbing  NEURO: alert & oriented x 3 with fluent speech, no focal motor/sensory deficits  LABORATORY DATA:  I have reviewed the data as listed No results found for this or any  previous visit (from the past 48 hour(s)).  Lab Results  Component Value Date   WBC 4.8 08/04/2014   HGB 6.3* 08/04/2014   HCT 19.0* 08/04/2014   MCV 87.6 08/04/2014   PLT 395 08/04/2014   ASSESSMENT & PLAN:  Transient acquired pure red cell aplasia The patient has end-stage renal dialysis and was dependent on erythropoietin stimulating agents. He became transfusion dependent. Bone marrow biopsy suggested possible parvovirus infection. I recommend a trial of IVIG along transfusion support. The risks, benefits, side effects of IVIG was discussed with the patient and he agreed to proceed. We discussed some of the risks, benefits, and alternatives of blood transfusions. The patient is symptomatic from anemia and the hemoglobin level is critically low.  Some of the side-effects to be expected including risks of transfusion reactions, chills, infection, syndrome of volume overload and risk of hospitalization from various reasons and the patient is willing to proceed and went ahead to sign consent today. I will proceed to give him 1 unit of blood transfusions whenever his hemoglobin is close to 7 g.  ESRD (end stage renal disease) on dialysis He will continue on hemodialysis on Mondays, Wednesdays and Fridays.      Human immunodeficiency virus (HIV) disease He is doing well on antiretrovirals medication. His last HIV viral load appeared to be suppressed. If he has a lack of response to IVIG, his HIV medication could be a contributing factor to persistent anemia     All questions were answered. The patient knows to call the clinic with any problems, questions or concerns. No barriers to learning was detected.  I spent 30 minutes counseling the patient face to face. The total time spent in the appointment was 40 minutes and more than 50% was on counseling.     [place], [place], MD 08/11/2014 8:53 AM

## 2014-08-11 NOTE — Progress Notes (Signed)
Patient observed for 30 minutes post IVIG infusion. Patient discharged ambulatory in no acute distress.

## 2014-08-11 NOTE — Telephone Encounter (Signed)
, °

## 2014-08-11 NOTE — Assessment & Plan Note (Signed)
He will continue on hemodialysis on Mondays, Wednesdays and Fridays.

## 2014-08-11 NOTE — Assessment & Plan Note (Addendum)
The patient has end-stage renal dialysis and was dependent on erythropoietin stimulating agents. He became transfusion dependent. Bone marrow biopsy suggested possible parvovirus infection. I recommend a trial of IVIG along transfusion support. The risks, benefits, side effects of IVIG was discussed with the patient and he agreed to proceed. We discussed some of the risks, benefits, and alternatives of blood transfusions. The patient is symptomatic from anemia and the hemoglobin level is critically low.  Some of the side-effects to be expected including risks of transfusion reactions, chills, infection, syndrome of volume overload and risk of hospitalization from various reasons and the patient is willing to proceed and went ahead to sign consent today. I will proceed to give him 1 unit of blood transfusions whenever his hemoglobin is close to 7 g.

## 2014-08-11 NOTE — Patient Instructions (Signed)

## 2014-08-11 NOTE — Assessment & Plan Note (Addendum)
He is doing well on antiretrovirals medication. His last HIV viral load appeared to be suppressed. If he has a lack of response to IVIG, his HIV medication could be a contributing factor to persistent anemia

## 2014-08-12 ENCOUNTER — Ambulatory Visit (HOSPITAL_BASED_OUTPATIENT_CLINIC_OR_DEPARTMENT_OTHER): Payer: Medicare Other

## 2014-08-12 ENCOUNTER — Other Ambulatory Visit: Payer: Self-pay | Admitting: *Deleted

## 2014-08-12 VITALS — BP 115/73 | HR 100 | Temp 98.1°F | Resp 18

## 2014-08-12 DIAGNOSIS — D649 Anemia, unspecified: Secondary | ICD-10-CM

## 2014-08-12 DIAGNOSIS — D601 Transient acquired pure red cell aplasia: Secondary | ICD-10-CM | POA: Diagnosis not present

## 2014-08-12 DIAGNOSIS — N186 End stage renal disease: Secondary | ICD-10-CM | POA: Diagnosis not present

## 2014-08-12 DIAGNOSIS — Z23 Encounter for immunization: Secondary | ICD-10-CM | POA: Diagnosis not present

## 2014-08-12 DIAGNOSIS — D631 Anemia in chronic kidney disease: Secondary | ICD-10-CM | POA: Diagnosis not present

## 2014-08-12 DIAGNOSIS — Z992 Dependence on renal dialysis: Principal | ICD-10-CM

## 2014-08-12 MED ORDER — ACETAMINOPHEN 325 MG PO TABS
ORAL_TABLET | ORAL | Status: AC
  Filled 2014-08-12: qty 2

## 2014-08-12 MED ORDER — ACETAMINOPHEN 325 MG PO TABS
650.0000 mg | ORAL_TABLET | Freq: Once | ORAL | Status: AC
  Administered 2014-08-12: 650 mg via ORAL

## 2014-08-12 MED ORDER — DIPHENHYDRAMINE HCL 25 MG PO CAPS
ORAL_CAPSULE | ORAL | Status: AC
  Filled 2014-08-12: qty 1

## 2014-08-12 MED ORDER — DIPHENHYDRAMINE HCL 25 MG PO CAPS
25.0000 mg | ORAL_CAPSULE | Freq: Once | ORAL | Status: AC
  Administered 2014-08-12: 25 mg via ORAL

## 2014-08-12 NOTE — Patient Instructions (Signed)

## 2014-08-14 LAB — TYPE AND SCREEN
ABO/RH(D): B POS
Antibody Screen: NEGATIVE
UNIT DIVISION: 0

## 2014-08-15 LAB — PARVOVIRUS B19 IGG: Parvovirus B19 IgG: 1.2 index — ABNORMAL HIGH (ref <0.9)

## 2014-08-15 LAB — CHROMOSOME ANALYSIS, BONE MARROW

## 2014-08-15 LAB — PARVOVIRUS B19 IGM: PARVOVIRUS B19 IGM: 14.4 {index} — AB (ref <0.9)

## 2014-08-18 ENCOUNTER — Telehealth: Payer: Self-pay | Admitting: *Deleted

## 2014-08-18 ENCOUNTER — Other Ambulatory Visit (HOSPITAL_BASED_OUTPATIENT_CLINIC_OR_DEPARTMENT_OTHER): Payer: Medicare Other

## 2014-08-18 DIAGNOSIS — Z992 Dependence on renal dialysis: Principal | ICD-10-CM

## 2014-08-18 DIAGNOSIS — D649 Anemia, unspecified: Secondary | ICD-10-CM

## 2014-08-18 DIAGNOSIS — N186 End stage renal disease: Secondary | ICD-10-CM

## 2014-08-18 LAB — CBC & DIFF AND RETIC
BASO%: 0.4 % (ref 0.0–2.0)
Basophils Absolute: 0 10*3/uL (ref 0.0–0.1)
EOS ABS: 0.3 10*3/uL (ref 0.0–0.5)
EOS%: 4.2 % (ref 0.0–7.0)
HCT: 24.9 % — ABNORMAL LOW (ref 38.4–49.9)
HGB: 8.2 g/dL — ABNORMAL LOW (ref 13.0–17.1)
IMMATURE RETIC FRACT: 56.8 % — AB (ref 3.00–10.60)
LYMPH#: 2.9 10*3/uL (ref 0.9–3.3)
LYMPH%: 36 % (ref 14.0–49.0)
MCH: 29.2 pg (ref 27.2–33.4)
MCHC: 32.9 g/dL (ref 32.0–36.0)
MCV: 88.6 fL (ref 79.3–98.0)
MONO#: 1 10*3/uL — AB (ref 0.1–0.9)
MONO%: 12.1 % (ref 0.0–14.0)
NEUT%: 47.3 % (ref 39.0–75.0)
NEUTROS ABS: 3.8 10*3/uL (ref 1.5–6.5)
NRBC: 8 % — AB (ref 0–0)
PLATELETS: 297 10*3/uL (ref 140–400)
RBC: 2.81 10*6/uL — AB (ref 4.20–5.82)
RDW: 14.3 % (ref 11.0–14.6)
RETIC %: 3.1 % — AB (ref 0.80–1.80)
RETIC CT ABS: 87.11 10*3/uL (ref 34.80–93.90)
WBC: 8 10*3/uL (ref 4.0–10.3)

## 2014-08-18 LAB — HOLD TUBE, BLOOD BANK

## 2014-08-18 NOTE — Telephone Encounter (Signed)
Informed pt of Hgb 8.2 and no need for transfusion today.  Keep appt for weekly labs as scheduled.  He verbalized understanding.

## 2014-08-23 ENCOUNTER — Encounter: Payer: Self-pay | Admitting: Hematology and Oncology

## 2014-08-25 ENCOUNTER — Other Ambulatory Visit (HOSPITAL_BASED_OUTPATIENT_CLINIC_OR_DEPARTMENT_OTHER): Payer: Medicare Other

## 2014-08-25 DIAGNOSIS — N186 End stage renal disease: Secondary | ICD-10-CM

## 2014-08-25 DIAGNOSIS — Z992 Dependence on renal dialysis: Principal | ICD-10-CM

## 2014-08-25 DIAGNOSIS — D649 Anemia, unspecified: Secondary | ICD-10-CM

## 2014-08-25 LAB — CBC & DIFF AND RETIC
BASO%: 0.2 % (ref 0.0–2.0)
Basophils Absolute: 0 10*3/uL (ref 0.0–0.1)
EOS%: 5 % (ref 0.0–7.0)
Eosinophils Absolute: 0.3 10*3/uL (ref 0.0–0.5)
HCT: 29.8 % — ABNORMAL LOW (ref 38.4–49.9)
HGB: 9.6 g/dL — ABNORMAL LOW (ref 13.0–17.1)
LYMPH#: 2.7 10*3/uL (ref 0.9–3.3)
LYMPH%: 40.5 % (ref 14.0–49.0)
MCH: 32.5 pg (ref 27.2–33.4)
MCHC: 32.2 g/dL (ref 32.0–36.0)
MCV: 101 fL — AB (ref 79.3–98.0)
MONO#: 0.7 10*3/uL (ref 0.1–0.9)
MONO%: 10.2 % (ref 0.0–14.0)
NEUT#: 2.9 10*3/uL (ref 1.5–6.5)
NEUT%: 44.1 % (ref 39.0–75.0)
NRBC: 1 % — AB (ref 0–0)
Platelets: 212 10*3/uL (ref 140–400)
RBC: 2.95 10*6/uL — ABNORMAL LOW (ref 4.20–5.82)
WBC: 6.7 10*3/uL (ref 4.0–10.3)

## 2014-08-25 LAB — RETICULOCYTES (CHCC)
ABS Retic: 294 10*3/uL — ABNORMAL HIGH (ref 19.0–186.0)
RBC.: 3 MIL/uL — ABNORMAL LOW (ref 4.22–5.81)
Retic Ct Pct: 9.8 % — ABNORMAL HIGH (ref 0.4–2.3)

## 2014-08-25 LAB — HOLD TUBE, BLOOD BANK

## 2014-08-31 ENCOUNTER — Encounter (HOSPITAL_COMMUNITY): Payer: Self-pay

## 2014-08-31 ENCOUNTER — Telehealth: Payer: Self-pay | Admitting: *Deleted

## 2014-08-31 NOTE — Telephone Encounter (Signed)
Pt called to r/s his lab from tomorrow morning to 12:30 pm or later tomorrow due to having dialysis in the morning.  Lab moved to 1:30 pm tomorrow and pt notified.

## 2014-09-01 ENCOUNTER — Other Ambulatory Visit: Payer: Self-pay

## 2014-09-08 ENCOUNTER — Telehealth: Payer: Self-pay | Admitting: *Deleted

## 2014-09-08 ENCOUNTER — Telehealth: Payer: Self-pay | Admitting: Hematology and Oncology

## 2014-09-08 ENCOUNTER — Other Ambulatory Visit: Payer: Self-pay | Admitting: Hematology and Oncology

## 2014-09-08 ENCOUNTER — Other Ambulatory Visit (HOSPITAL_BASED_OUTPATIENT_CLINIC_OR_DEPARTMENT_OTHER): Payer: Medicare Other

## 2014-09-08 DIAGNOSIS — Z992 Dependence on renal dialysis: Principal | ICD-10-CM

## 2014-09-08 DIAGNOSIS — N186 End stage renal disease: Secondary | ICD-10-CM

## 2014-09-08 DIAGNOSIS — D649 Anemia, unspecified: Secondary | ICD-10-CM | POA: Diagnosis not present

## 2014-09-08 LAB — CBC & DIFF AND RETIC
BASO%: 0 % (ref 0.0–2.0)
Basophils Absolute: 0 10*3/uL (ref 0.0–0.1)
EOS ABS: 0.2 10*3/uL (ref 0.0–0.5)
EOS%: 4.2 % (ref 0.0–7.0)
HCT: 38.5 % (ref 38.4–49.9)
HGB: 12.2 g/dL — ABNORMAL LOW (ref 13.0–17.1)
Immature Retic Fract: 12.9 % — ABNORMAL HIGH (ref 3.00–10.60)
LYMPH%: 46 % (ref 14.0–49.0)
MCH: 34.2 pg — ABNORMAL HIGH (ref 27.2–33.4)
MCHC: 31.7 g/dL — AB (ref 32.0–36.0)
MCV: 107.8 fL — ABNORMAL HIGH (ref 79.3–98.0)
MONO#: 0.7 10*3/uL (ref 0.1–0.9)
MONO%: 13.9 % (ref 0.0–14.0)
NEUT#: 1.7 10*3/uL (ref 1.5–6.5)
NEUT%: 35.9 % — AB (ref 39.0–75.0)
PLATELETS: 179 10*3/uL (ref 140–400)
RBC: 3.57 10*6/uL — ABNORMAL LOW (ref 4.20–5.82)
RDW: 22.6 % — ABNORMAL HIGH (ref 11.0–14.6)
Retic %: 5.43 % — ABNORMAL HIGH (ref 0.80–1.80)
Retic Ct Abs: 193.85 10*3/uL — ABNORMAL HIGH (ref 34.80–93.90)
WBC: 4.7 10*3/uL (ref 4.0–10.3)
lymph#: 2.2 10*3/uL (ref 0.9–3.3)

## 2014-09-08 LAB — HOLD TUBE, BLOOD BANK

## 2014-09-08 NOTE — Telephone Encounter (Signed)
Pt aware of his improved hgb today.  Informed him Dr. Alvy Bimler said he does not need to come back to [place] as his anemia is better.  We canceled all his upcoming appts..  Instructed him to contact us if anything changes in the future.  He verbalized understanding and says he wants Dr. Alvy Bimler to know how much he appreciated all her help.  He said he is extremely happy!

## 2014-09-08 NOTE — Telephone Encounter (Signed)
My nurse will contact the patient about test results. Since his IVIG treatment, his anemia has resolved. I recommend he follows with his nephrologist only and I will cancel his appointment next week. 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

## 2014-09-10 DIAGNOSIS — N186 End stage renal disease: Secondary | ICD-10-CM | POA: Diagnosis not present

## 2014-09-10 DIAGNOSIS — Z992 Dependence on renal dialysis: Secondary | ICD-10-CM | POA: Diagnosis not present

## 2014-09-13 DIAGNOSIS — N186 End stage renal disease: Secondary | ICD-10-CM | POA: Diagnosis not present

## 2014-09-13 DIAGNOSIS — D631 Anemia in chronic kidney disease: Secondary | ICD-10-CM | POA: Diagnosis not present

## 2014-09-15 ENCOUNTER — Other Ambulatory Visit: Payer: Self-pay

## 2014-09-15 ENCOUNTER — Ambulatory Visit: Payer: Self-pay | Admitting: Hematology and Oncology

## 2014-09-21 ENCOUNTER — Other Ambulatory Visit: Payer: Self-pay | Admitting: Hematology and Oncology

## 2014-10-10 DIAGNOSIS — N186 End stage renal disease: Secondary | ICD-10-CM | POA: Diagnosis not present

## 2014-10-10 DIAGNOSIS — Z992 Dependence on renal dialysis: Secondary | ICD-10-CM | POA: Diagnosis not present

## 2014-10-12 DIAGNOSIS — N186 End stage renal disease: Secondary | ICD-10-CM | POA: Diagnosis not present

## 2014-10-12 DIAGNOSIS — D509 Iron deficiency anemia, unspecified: Secondary | ICD-10-CM | POA: Diagnosis not present

## 2014-10-26 DIAGNOSIS — Z114 Encounter for screening for human immunodeficiency virus [HIV]: Secondary | ICD-10-CM | POA: Diagnosis not present

## 2014-10-28 DIAGNOSIS — N186 End stage renal disease: Secondary | ICD-10-CM | POA: Diagnosis not present

## 2014-10-31 DIAGNOSIS — N186 End stage renal disease: Secondary | ICD-10-CM | POA: Diagnosis not present

## 2014-11-02 DIAGNOSIS — N186 End stage renal disease: Secondary | ICD-10-CM | POA: Diagnosis not present

## 2014-11-05 ENCOUNTER — Encounter (HOSPITAL_BASED_OUTPATIENT_CLINIC_OR_DEPARTMENT_OTHER): Payer: Self-pay | Admitting: Emergency Medicine

## 2014-11-05 DIAGNOSIS — Z992 Dependence on renal dialysis: Secondary | ICD-10-CM | POA: Diagnosis not present

## 2014-11-05 DIAGNOSIS — N186 End stage renal disease: Secondary | ICD-10-CM | POA: Insufficient documentation

## 2014-11-05 DIAGNOSIS — D649 Anemia, unspecified: Secondary | ICD-10-CM | POA: Insufficient documentation

## 2014-11-05 DIAGNOSIS — R011 Cardiac murmur, unspecified: Secondary | ICD-10-CM | POA: Diagnosis not present

## 2014-11-05 DIAGNOSIS — Z79899 Other long term (current) drug therapy: Secondary | ICD-10-CM | POA: Insufficient documentation

## 2014-11-05 DIAGNOSIS — N342 Other urethritis: Secondary | ICD-10-CM | POA: Diagnosis not present

## 2014-11-05 DIAGNOSIS — Z21 Asymptomatic human immunodeficiency virus [HIV] infection status: Secondary | ICD-10-CM | POA: Diagnosis not present

## 2014-11-05 DIAGNOSIS — N341 Nonspecific urethritis: Secondary | ICD-10-CM | POA: Diagnosis not present

## 2014-11-05 DIAGNOSIS — R369 Urethral discharge, unspecified: Secondary | ICD-10-CM | POA: Diagnosis present

## 2014-11-05 DIAGNOSIS — I120 Hypertensive chronic kidney disease with stage 5 chronic kidney disease or end stage renal disease: Secondary | ICD-10-CM | POA: Insufficient documentation

## 2014-11-05 NOTE — ED Notes (Signed)
PT presents to ED with complaints of drainage from penis and burning with urination.

## 2014-11-06 ENCOUNTER — Emergency Department (HOSPITAL_BASED_OUTPATIENT_CLINIC_OR_DEPARTMENT_OTHER)
Admission: EM | Admit: 2014-11-06 | Discharge: 2014-11-06 | Disposition: A | Discharge status: Home / Self Care | Payer: Medicare Other | Attending: Emergency Medicine | Admitting: Emergency Medicine

## 2014-11-06 DIAGNOSIS — N342 Other urethritis: Secondary | ICD-10-CM

## 2014-11-06 MED ORDER — AZITHROMYCIN 250 MG PO TABS
1000.0000 mg | ORAL_TABLET | Freq: Once | ORAL | Status: AC
  Administered 2014-11-06: 1000 mg via ORAL
  Filled 2014-11-06: qty 4

## 2014-11-06 MED ORDER — CEFTRIAXONE SODIUM 250 MG IJ SOLR
250.0000 mg | Freq: Once | INTRAMUSCULAR | Status: AC
  Administered 2014-11-06: 250 mg via INTRAMUSCULAR
  Filled 2014-11-06: qty 250

## 2014-11-06 NOTE — ED Provider Notes (Addendum)
CSN: [id number]     Arrival date & time 11/05/14  2242 History   First MD Initiated Contact with Patient 11/06/14 0148     Chief Complaint  Patient presents with  . Penile Discharge      (Consider location/radiation/quality/duration/timing/severity/associated sxs/prior Treatment) HPI This is a 38 year old male with HIV disease and end-stage renal disease on hemodialysis. He is here with a three-day history of watery penile discharge with burning on urination. His sexual partner advised him that she tested positive for chlamydia although he insists he has used condoms regularly. Symptoms are mild to moderate. They worsened yesterday. He denies any other symptoms such as fever, chills, nausea, vomiting, diarrhea, chest pain, shortness of breath, cough or abdominal pain.  Past Medical History  Diagnosis Date  . Hypertension   . Dialysis patient   . Renal disorder   . HIV disease   . Anemia   . Seizure   . Heart murmur   . Lymphocytosis 06/16/2014   Past Surgical History  Procedure Laterality Date  . Av fistula placement Left 05/07/2013    Procedure: ARTERIOVENOUS (AV) FISTULA CREATION- LEFT;  Surgeon: Rosetta Posner, MD;  Location: [place];  Service: Vascular;  Laterality: Left;  . Insertion of dialysis catheter      x 2  . Av fistula placement Left 08/12/2013    Procedure: ARTERIOVENOUS (AV) FISTULA CREATION BRACHIOCEPHALIC;  Surgeon: Angelia Mould, MD;  Location: [place];  Service: Vascular;  Laterality: Left;  . Ligation of arteriovenous  fistula Left 08/12/2013    Procedure: LIGATION OF ARTERIOVENOUS  FISTULA RADIOCEPHALIC;  Surgeon: Angelia Mould, MD;  Location: [facility] OR;  Service: Vascular;  Laterality: Left;   Family History  Problem Relation Age of Onset  . Kidney failure Mother   . Kidney disease Mother   . Kidney failure Maternal Uncle   . Kidney disease Paternal Uncle   . Kidney disease Maternal Grandfather    History  Substance Use Topics  . Smoking status:  Never Smoker   . Smokeless tobacco: Never Used  . Alcohol Use: No    Review of Systems  All other systems reviewed and are negative.   Allergies  Codeine; Eggs or egg-derived products; Heparin; and Mercury  Home Medications   Prior to Admission medications   Medication Sig Start Date End Date Taking? Authorizing Provider  abacavir (ZIAGEN) 300 MG tablet Take 600 mg by mouth daily at 12 noon.    Historical Provider, MD  carvedilol (COREG) 6.25 MG tablet Take 6.25 mg by mouth daily at 12 noon.    Historical Provider, MD  Darunavir Ethanolate (PREZISTA) 800 MG tablet Take 800 mg by mouth daily at 12 noon.    Historical Provider, MD  diphenhydrAMINE (BENADRYL) 25 MG tablet Take 25 mg by mouth every 6 (six) hours as needed for itching. During dialysis    Historical Provider, MD  lamivudine (EPIVIR) 100 MG tablet Take 50 mg by mouth daily. On dialysis days: Mon, Wed, and Fri takes after 5 pm when dialysis treatment is finished. Tuesdays, thurs, sat, and Sunday takes between 12-1 pm.    Historical Provider, MD  Multiple Vitamin (MULTIVITAMIN) tablet Take 1 tablet by mouth daily.    Historical Provider, MD  ritonavir (NORVIR) 100 MG capsule Take 100 mg by mouth daily at 12 noon.    Historical Provider, MD   BP 151/91 mmHg  Pulse 125  Temp(Src) 98.8 F (37.1 C) (Oral)  Resp 20  Ht 5\' 7"  (1.702 m)  Wt 160 lb (72.576 kg)  BMI 25.05 kg/m2  SpO2 100%   Physical Exam  General: Well-developed, well-nourished male in no acute distress; appearance consistent with age of record HENT: normocephalic; atraumatic Eyes: pupils equal, round and reactive to light; extraocular muscles intact Neck: supple Heart: regular rate and rhythm Lungs: clear to auscultation bilaterally Abdomen: soft; nondistended; nontender; no masses or hepatosplenomegaly; bowel sounds present GU: Tanner 5 male, circumcised; watery urethral discharge Extremities: No deformity; full range of motion Neurologic: Awake, alert  and oriented; motor function intact in all extremities and symmetric; no facial droop Skin: Warm and dry Psychiatric: Normal mood and affect    ED Course  Procedures (including critical care time)   MDM    advised patient that he may need re-dosing of Rocephin and Zithromax after his next dialysis. He needs to address this with staff at his next dialysis appointment.  Wynetta Fines, MD 11/06/14 [place], MD 11/06/14 0200

## 2014-11-06 NOTE — Discharge Instructions (Signed)
You have been given Rocephin 250mg  IM and Zithromax 1g PO for treatment of urethritis. At your next dialysis appointment please inform them of this. You may need to be re-treated after dialysis.

## 2014-11-06 NOTE — ED Notes (Signed)
C/o d/c onset friday night, dysuria onset this am, (denies: pain, sob, nv, fever, back or abd pain, dizziness or other sx).

## 2014-11-07 LAB — GC/CHLAMYDIA PROBE AMP
CT PROBE, AMP APTIMA: NEGATIVE
GC Probe RNA: NEGATIVE

## 2014-11-10 DIAGNOSIS — N186 End stage renal disease: Secondary | ICD-10-CM | POA: Diagnosis not present

## 2014-11-10 DIAGNOSIS — Z992 Dependence on renal dialysis: Secondary | ICD-10-CM | POA: Diagnosis not present

## 2014-11-12 DIAGNOSIS — N186 End stage renal disease: Secondary | ICD-10-CM | POA: Diagnosis not present

## 2014-11-12 DIAGNOSIS — D509 Iron deficiency anemia, unspecified: Secondary | ICD-10-CM | POA: Diagnosis not present

## 2014-11-14 DIAGNOSIS — N186 End stage renal disease: Secondary | ICD-10-CM | POA: Diagnosis not present

## 2014-11-14 DIAGNOSIS — D509 Iron deficiency anemia, unspecified: Secondary | ICD-10-CM | POA: Diagnosis not present

## 2014-11-16 ENCOUNTER — Emergency Department (HOSPITAL_COMMUNITY): Payer: Medicare Other

## 2014-11-16 ENCOUNTER — Emergency Department (HOSPITAL_COMMUNITY)
Admission: EM | Admit: 2014-11-16 | Discharge: 2014-11-16 | Disposition: A | Discharge status: Home / Self Care | Payer: Medicare Other | Attending: Emergency Medicine | Admitting: Emergency Medicine

## 2014-11-16 ENCOUNTER — Encounter (HOSPITAL_COMMUNITY): Payer: Self-pay | Admitting: Family Medicine

## 2014-11-16 DIAGNOSIS — R011 Cardiac murmur, unspecified: Secondary | ICD-10-CM | POA: Diagnosis not present

## 2014-11-16 DIAGNOSIS — K529 Noninfective gastroenteritis and colitis, unspecified: Secondary | ICD-10-CM | POA: Diagnosis not present

## 2014-11-16 DIAGNOSIS — D509 Iron deficiency anemia, unspecified: Secondary | ICD-10-CM | POA: Diagnosis not present

## 2014-11-16 DIAGNOSIS — R1030 Lower abdominal pain, unspecified: Secondary | ICD-10-CM

## 2014-11-16 DIAGNOSIS — Z79899 Other long term (current) drug therapy: Secondary | ICD-10-CM | POA: Diagnosis not present

## 2014-11-16 DIAGNOSIS — R197 Diarrhea, unspecified: Secondary | ICD-10-CM | POA: Diagnosis present

## 2014-11-16 DIAGNOSIS — Z21 Asymptomatic human immunodeficiency virus [HIV] infection status: Secondary | ICD-10-CM | POA: Diagnosis not present

## 2014-11-16 DIAGNOSIS — I10 Essential (primary) hypertension: Secondary | ICD-10-CM | POA: Diagnosis not present

## 2014-11-16 DIAGNOSIS — Z862 Personal history of diseases of the blood and blood-forming organs and certain disorders involving the immune mechanism: Secondary | ICD-10-CM | POA: Insufficient documentation

## 2014-11-16 DIAGNOSIS — N186 End stage renal disease: Secondary | ICD-10-CM | POA: Diagnosis not present

## 2014-11-16 DIAGNOSIS — Z992 Dependence on renal dialysis: Secondary | ICD-10-CM | POA: Insufficient documentation

## 2014-11-16 DIAGNOSIS — N269 Renal sclerosis, unspecified: Secondary | ICD-10-CM | POA: Diagnosis not present

## 2014-11-16 LAB — COMPREHENSIVE METABOLIC PANEL
ALT: 19 U/L (ref 0–53)
AST: 29 U/L (ref 0–37)
Albumin: 3.7 g/dL (ref 3.5–5.2)
Alkaline Phosphatase: 123 U/L — ABNORMAL HIGH (ref 39–117)
Anion gap: 17 — ABNORMAL HIGH (ref 5–15)
BUN: 13 mg/dL (ref 6–23)
CO2: 27 mmol/L (ref 19–32)
Calcium: 9.3 mg/dL (ref 8.4–10.5)
Chloride: 93 mEq/L — ABNORMAL LOW (ref 96–112)
Creatinine, Ser: 7.18 mg/dL — ABNORMAL HIGH (ref 0.50–1.35)
GFR calc Af Amer: 10 mL/min — ABNORMAL LOW (ref >90)
GFR calc non Af Amer: 9 mL/min — ABNORMAL LOW (ref >90)
Glucose, Bld: 103 mg/dL — ABNORMAL HIGH (ref 70–99)
Potassium: 3 mmol/L — ABNORMAL LOW (ref 3.5–5.1)
Sodium: 137 mmol/L (ref 135–145)
Total Bilirubin: 0.6 mg/dL (ref 0.3–1.2)
Total Protein: 9.3 g/dL — ABNORMAL HIGH (ref 6.0–8.3)

## 2014-11-16 LAB — CBC WITH DIFFERENTIAL/PLATELET
Basophils Absolute: 0 10*3/uL (ref 0.0–0.1)
Basophils Relative: 1 % (ref 0–1)
Eosinophils Absolute: 0.1 10*3/uL (ref 0.0–0.7)
Eosinophils Relative: 1 % (ref 0–5)
HCT: 41.5 % (ref 39.0–52.0)
Hemoglobin: 14.3 g/dL (ref 13.0–17.0)
Lymphocytes Relative: 49 % — ABNORMAL HIGH (ref 12–46)
Lymphs Abs: 2.1 10*3/uL (ref 0.7–4.0)
MCH: 35.2 pg — ABNORMAL HIGH (ref 26.0–34.0)
MCHC: 34.5 g/dL (ref 30.0–36.0)
MCV: 102.2 fL — ABNORMAL HIGH (ref 78.0–100.0)
Monocytes Absolute: 0.4 10*3/uL (ref 0.1–1.0)
Monocytes Relative: 9 % (ref 3–12)
Neutro Abs: 1.7 10*3/uL (ref 1.7–7.7)
Neutrophils Relative %: 40 % — ABNORMAL LOW (ref 43–77)
Platelets: 159 10*3/uL (ref 150–400)
RBC: 4.06 MIL/uL — ABNORMAL LOW (ref 4.22–5.81)
RDW: 14.6 % (ref 11.5–15.5)
WBC: 4.3 10*3/uL (ref 4.0–10.5)

## 2014-11-16 LAB — LIPASE, BLOOD: Lipase: 55 U/L (ref 11–59)

## 2014-11-16 MED ORDER — PROMETHAZINE HCL 25 MG/ML IJ SOLN
25.0000 mg | Freq: Once | INTRAMUSCULAR | Status: AC
  Administered 2014-11-16: 25 mg via INTRAVENOUS
  Filled 2014-11-16: qty 1

## 2014-11-16 MED ORDER — ONDANSETRON HCL 4 MG/2ML IJ SOLN
4.0000 mg | Freq: Once | INTRAMUSCULAR | Status: DC
  Filled 2014-11-16: qty 2

## 2014-11-16 MED ORDER — SODIUM CHLORIDE 0.9 % IV BOLUS (SEPSIS)
500.0000 mL | Freq: Once | INTRAVENOUS | Status: AC
  Administered 2014-11-16: 500 mL via INTRAVENOUS

## 2014-11-16 MED ORDER — SODIUM CHLORIDE 0.9 % IV BOLUS (SEPSIS): 1000.0000 mL | Freq: Once | INTRAVENOUS | Status: DC

## 2014-11-16 MED ORDER — POTASSIUM CHLORIDE 10 MEQ/100ML IV SOLN: 10.0000 meq | Freq: Once | INTRAVENOUS | Status: DC

## 2014-11-16 MED ORDER — PROMETHAZINE HCL 25 MG PO TABS: 25.0000 mg | ORAL_TABLET | Freq: Three times a day (TID) | ORAL | Status: DC | PRN

## 2014-11-16 MED ORDER — DIPHENOXYLATE-ATROPINE 2.5-0.025 MG PO TABS: 1.0000 | ORAL_TABLET | Freq: Four times a day (QID) | ORAL | Status: DC | PRN

## 2014-11-16 MED ORDER — PROMETHAZINE HCL 25 MG/ML IJ SOLN
12.5000 mg | Freq: Once | INTRAMUSCULAR | Status: AC
  Administered 2014-11-16: 12.5 mg via INTRAVENOUS
  Filled 2014-11-16: qty 1

## 2014-11-16 MED ORDER — POTASSIUM CHLORIDE 10 MEQ/100ML IV SOLN
10.0000 meq | INTRAVENOUS | Status: DC
  Administered 2014-11-16: 10 meq via INTRAVENOUS
  Filled 2014-11-16: qty 100

## 2014-11-16 MED ORDER — POTASSIUM CHLORIDE CRYS ER 20 MEQ PO TBCR
40.0000 meq | EXTENDED_RELEASE_TABLET | Freq: Once | ORAL | Status: AC
  Administered 2014-11-16: 40 meq via ORAL
  Filled 2014-11-16: qty 2

## 2014-11-16 MED ORDER — IOHEXOL 300 MG/ML  SOLN
25.0000 mL | Freq: Once | INTRAMUSCULAR | Status: AC | PRN
  Administered 2014-11-16: 25 mL via ORAL

## 2014-11-16 MED ORDER — SODIUM CHLORIDE 0.9 % IV BOLUS (SEPSIS): 250.0000 mL | Freq: Once | INTRAVENOUS | Status: DC

## 2014-11-16 MED ORDER — PROMETHAZINE HCL 25 MG RE SUPP: 25.0000 mg | Freq: Four times a day (QID) | RECTAL | Status: DC | PRN

## 2014-11-16 NOTE — ED Notes (Addendum)
Pt presents via [facility] from Dialysis with c/o NVD x3-4 days.  Pt reports emesis x2/day, 4-5 episodes of diarrhea daily x3-4 days, and central abdominal pain. Pt received entire Dialysis treatment today and still has his fistula accessed.  EMS notes that patient was hypotensive at 92/55 PTA. Pt did receive 4mg  IM Zofran from EMS. Pt is A&Ox4.

## 2014-11-16 NOTE — ED Notes (Signed)
Pt states that he does not make urine, and will not be able to give a sample.

## 2014-11-16 NOTE — ED Notes (Signed)
Pt states that the potassium is burning too bad, and does not want it. This Rn stopped the potassium infusion, and alerted [place], [place]. Pt also states that he is on a fluid restriction and does not want to drink the oral contrast because he can only have 1L of fluid between dialysis treatments.

## 2014-11-16 NOTE — ED Notes (Signed)
CT notified that the pt is not tolerating the oral contrast and is refusing IV contrast, but Vanita Panda, MD woukld like to the CT done without any contrast.

## 2014-11-16 NOTE — Discharge Instructions (Signed)
Return here as needed. Follow up with your doctor. This is most likely a viral GI illness. Slowly increase your fluid intake. Follow up with your doctor.

## 2014-11-16 NOTE — ED Provider Notes (Signed)
CSN: [id number]     Arrival date & time 11/16/14  1658 History   First MD Initiated Contact with Patient 11/16/14 1702     Chief Complaint  Patient presents with  . Emesis  . Diarrhea     (Consider location/radiation/quality/duration/timing/severity/associated sxs/prior Treatment) Patient is a 39 y.o. male presenting with vomiting. The history is provided by the patient.  Emesis Severity:  Moderate Duration:  4 days Timing:  Intermittent Quality:  Bilious material Progression:  Unchanged Context: not post-tussive and not self-induced   Relieved by:  Nothing Worsened by:  Nothing tried Associated symptoms: abdominal pain and diarrhea   Associated symptoms: no arthralgias, no chills, no cough, no fever, no headaches, no myalgias, no sore throat and no URI   Abdominal pain:    Location:  Generalized   Severity:  Moderate   Onset quality:  Gradual   Timing:  Constant   Progression:  Unchanged   Past Medical History  Diagnosis Date  . Hypertension   . Dialysis patient   . Renal disorder   . HIV disease   . Anemia   . Seizure   . Heart murmur   . Lymphocytosis 06/16/2014   Past Surgical History  Procedure Laterality Date  . Av fistula placement Left 05/07/2013    Procedure: ARTERIOVENOUS (AV) FISTULA CREATION- LEFT;  Surgeon: Rosetta Posner, MD;  Location: [place];  Service: Vascular;  Laterality: Left;  . Insertion of dialysis catheter      x 2  . Av fistula placement Left 08/12/2013    Procedure: ARTERIOVENOUS (AV) FISTULA CREATION BRACHIOCEPHALIC;  Surgeon: Angelia Mould, MD;  Location: [place];  Service: Vascular;  Laterality: Left;  . Ligation of arteriovenous  fistula Left 08/12/2013    Procedure: LIGATION OF ARTERIOVENOUS  FISTULA RADIOCEPHALIC;  Surgeon: Angelia Mould, MD;  Location: [facility] OR;  Service: Vascular;  Laterality: Left;   Family History  Problem Relation Age of Onset  . Kidney failure Mother   . Kidney disease Mother   . Kidney failure Maternal  Uncle   . Kidney disease Paternal Uncle   . Kidney disease Maternal Grandfather    History  Substance Use Topics  . Smoking status: Never Smoker   . Smokeless tobacco: Never Used  . Alcohol Use: No    Review of Systems  Constitutional: Negative for chills.  HENT: Negative for rhinorrhea, sore throat and trouble swallowing.   Eyes: Negative for visual disturbance.  Respiratory: Negative for cough.   Cardiovascular: Negative for chest pain and leg swelling.  Gastrointestinal: Positive for vomiting, abdominal pain and diarrhea.  Genitourinary: Negative for flank pain.  Musculoskeletal: Negative for myalgias and arthralgias.  Allergic/Immunologic: Positive for immunocompromised state.  Neurological: Negative for weakness and headaches.    All other systems negative except as documented in the HPI. All pertinent positives and negatives as reviewed in the HPI.   Allergies  Codeine; Eggs or egg-derived products; Heparin; and Mercury  Home Medications   Prior to Admission medications   Medication Sig Start Date End Date Taking? Authorizing Provider  abacavir (ZIAGEN) 300 MG tablet Take 600 mg by mouth daily at 12 noon.    Historical Provider, MD  carvedilol (COREG) 6.25 MG tablet Take 6.25 mg by mouth daily at 12 noon.    Historical Provider, MD  Darunavir Ethanolate (PREZISTA) 800 MG tablet Take 800 mg by mouth daily at 12 noon.    Historical Provider, MD  diphenhydrAMINE (BENADRYL) 25 MG tablet Take 25 mg by  mouth every 6 (six) hours as needed for itching. During dialysis    Historical Provider, MD  lamivudine (EPIVIR) 100 MG tablet Take 50 mg by mouth daily. On dialysis days: Mon, Wed, and Fri takes after 5 pm when dialysis treatment is finished. Tuesdays, thurs, sat, and Sunday takes between 12-1 pm.    Historical Provider, MD  Multiple Vitamin (MULTIVITAMIN) tablet Take 1 tablet by mouth daily.    Historical Provider, MD  ritonavir (NORVIR) 100 MG capsule Take 100 mg by mouth  daily at 12 noon.    Historical Provider, MD   BP 145/74 mmHg  Pulse 100  Temp(Src) 97.6 F (36.4 C) (Oral)  Resp 14  SpO2 100% Physical Exam  Constitutional: He is oriented to person, place, and time. He appears well-developed and well-nourished. No distress.  HENT:  Head: Normocephalic and atraumatic.  Mouth/Throat: Oropharynx is clear and moist.  Eyes: Pupils are equal, round, and reactive to light.  Neck: Normal range of motion. Neck supple.  Cardiovascular: Normal rate, regular rhythm and normal heart sounds.  Exam reveals no gallop and no friction rub.   No murmur heard. Pulmonary/Chest: Effort normal and breath sounds normal. No respiratory distress. He has no wheezes.  Abdominal: Soft. Bowel sounds are normal. He exhibits no distension. There is tenderness. There is no rebound and no guarding.  Musculoskeletal: He exhibits no edema.  Neurological: He is alert and oriented to person, place, and time. He exhibits normal muscle tone. Coordination normal.  Skin: Skin is warm and dry.  Psychiatric: He has a normal mood and affect. His behavior is normal.  Nursing note and vitals reviewed.   ED Course  Procedures (including critical care time) Labs Review Labs Reviewed  COMPREHENSIVE METABOLIC PANEL - Abnormal; Notable for the following:    Potassium 3.0 (*)    Chloride 93 (*)    Glucose, Bld 103 (*)    Creatinine, Ser 7.18 (*)    Total Protein 9.3 (*)    Alkaline Phosphatase 123 (*)    GFR calc non Af Amer 9 (*)    GFR calc Af Amer 10 (*)    Anion gap 17 (*)    All other components within normal limits  CBC WITH DIFFERENTIAL - Abnormal; Notable for the following:    RBC 4.06 (*)    MCV 102.2 (*)    MCH 35.2 (*)    Neutrophils Relative % 40 (*)    Lymphocytes Relative 49 (*)    All other components within normal limits  LIPASE, BLOOD    Imaging Review Ct Abdomen Pelvis Wo Contrast  11/16/2014   CLINICAL DATA:  Nausea, vomiting, and diarrhea for 4 days. Emesis 2  times per day. Five episodes of diarrhea daily for 4 days. Central abdominal pain. Patient had dialysis today and was hypotensive en route.  EXAM: CT ABDOMEN AND PELVIS WITHOUT CONTRAST  TECHNIQUE: Multidetector CT imaging of the abdomen and pelvis was performed following the standard protocol without IV contrast.  COMPARISON:  CT chest 03/27/2014  FINDINGS: The lung bases are clear.  The unenhanced appearance of the liver, spleen, gallbladder, pancreas, adrenal glands, and abdominal aorta, is unremarkable. Relative flattening of the inferior vena cava may correspond to hypovolemia. Mild prominence of retroperitoneal lymph nodes without pathologic enlargement, likely to be reactive. Small accessory spleens. Bilateral kidneys are diffusely atrophic. No hydronephrosis. Stomach and small bowel are decompressed resulting in limited evaluation. No bowel distention. Gas and stool throughout the colon without significant distention or appear  wall thickening. No free air or free fluid in the abdomen.  Pelvis: Prostate gland is not enlarged. Bladder is decompressed. No pelvic mass or lymphadenopathy. Appendix is normal. No inflammatory changes suggested in the pelvis. No free or loculated pelvic fluid collections.  IMPRESSION: No acute inflammatory process demonstrated in the abdomen or pelvis. Flattening of the inferior vena cava may indicate hypovolemia. Bilateral renal atrophy. No evidence of bowel obstruction.   Electronically Signed   By: Lucienne Capers M.D.   On: 11/16/2014 22:06    Patient is advised the results and all questions were answered.  Patient is rechecked 4.  The patient was given some gradual IV fluids and advised that he does appear dehydrated based on his testing.  I also advised this appears to be a gastroenteritis and his CT scan did not show any significant abnormalities.  I advised the patient to return here for any worsening in his condition, will need to follow-up with his primary care  doctor.    MDM   Final diagnoses:  Abdominal pain, lower       Brent General, PA-C 11/18/14 [place], PA-C 11/18/14 [id number]  Carmin Muskrat, MD 11/19/14 2238

## 2014-11-18 DIAGNOSIS — N186 End stage renal disease: Secondary | ICD-10-CM | POA: Diagnosis not present

## 2014-11-18 DIAGNOSIS — D509 Iron deficiency anemia, unspecified: Secondary | ICD-10-CM | POA: Diagnosis not present

## 2014-11-21 DIAGNOSIS — D509 Iron deficiency anemia, unspecified: Secondary | ICD-10-CM | POA: Diagnosis not present

## 2014-11-21 DIAGNOSIS — N186 End stage renal disease: Secondary | ICD-10-CM | POA: Diagnosis not present

## 2014-11-23 DIAGNOSIS — N186 End stage renal disease: Secondary | ICD-10-CM | POA: Diagnosis not present

## 2014-11-23 DIAGNOSIS — D509 Iron deficiency anemia, unspecified: Secondary | ICD-10-CM | POA: Diagnosis not present

## 2014-11-25 DIAGNOSIS — N186 End stage renal disease: Secondary | ICD-10-CM | POA: Diagnosis not present

## 2014-11-25 DIAGNOSIS — D509 Iron deficiency anemia, unspecified: Secondary | ICD-10-CM | POA: Diagnosis not present

## 2014-11-28 DIAGNOSIS — D509 Iron deficiency anemia, unspecified: Secondary | ICD-10-CM | POA: Diagnosis not present

## 2014-11-28 DIAGNOSIS — N186 End stage renal disease: Secondary | ICD-10-CM | POA: Diagnosis not present

## 2014-11-30 DIAGNOSIS — N186 End stage renal disease: Secondary | ICD-10-CM | POA: Diagnosis not present

## 2014-11-30 DIAGNOSIS — D509 Iron deficiency anemia, unspecified: Secondary | ICD-10-CM | POA: Diagnosis not present

## 2014-12-02 DIAGNOSIS — N186 End stage renal disease: Secondary | ICD-10-CM | POA: Diagnosis not present

## 2014-12-02 DIAGNOSIS — D509 Iron deficiency anemia, unspecified: Secondary | ICD-10-CM | POA: Diagnosis not present

## 2014-12-05 DIAGNOSIS — D509 Iron deficiency anemia, unspecified: Secondary | ICD-10-CM | POA: Diagnosis not present

## 2014-12-05 DIAGNOSIS — N186 End stage renal disease: Secondary | ICD-10-CM | POA: Diagnosis not present

## 2014-12-07 DIAGNOSIS — D509 Iron deficiency anemia, unspecified: Secondary | ICD-10-CM | POA: Diagnosis not present

## 2014-12-07 DIAGNOSIS — N186 End stage renal disease: Secondary | ICD-10-CM | POA: Diagnosis not present

## 2014-12-09 DIAGNOSIS — D509 Iron deficiency anemia, unspecified: Secondary | ICD-10-CM | POA: Diagnosis not present

## 2014-12-09 DIAGNOSIS — N186 End stage renal disease: Secondary | ICD-10-CM | POA: Diagnosis not present

## 2014-12-11 DIAGNOSIS — N186 End stage renal disease: Secondary | ICD-10-CM | POA: Diagnosis not present

## 2014-12-11 DIAGNOSIS — Z992 Dependence on renal dialysis: Secondary | ICD-10-CM | POA: Diagnosis not present

## 2014-12-12 DIAGNOSIS — D631 Anemia in chronic kidney disease: Secondary | ICD-10-CM | POA: Diagnosis not present

## 2014-12-12 DIAGNOSIS — N186 End stage renal disease: Secondary | ICD-10-CM | POA: Diagnosis not present

## 2014-12-14 DIAGNOSIS — N186 End stage renal disease: Secondary | ICD-10-CM | POA: Diagnosis not present

## 2014-12-14 DIAGNOSIS — D631 Anemia in chronic kidney disease: Secondary | ICD-10-CM | POA: Diagnosis not present

## 2014-12-16 DIAGNOSIS — N186 End stage renal disease: Secondary | ICD-10-CM | POA: Diagnosis not present

## 2014-12-16 DIAGNOSIS — D631 Anemia in chronic kidney disease: Secondary | ICD-10-CM | POA: Diagnosis not present

## 2014-12-19 DIAGNOSIS — D631 Anemia in chronic kidney disease: Secondary | ICD-10-CM | POA: Diagnosis not present

## 2014-12-19 DIAGNOSIS — N186 End stage renal disease: Secondary | ICD-10-CM | POA: Diagnosis not present

## 2014-12-21 DIAGNOSIS — D631 Anemia in chronic kidney disease: Secondary | ICD-10-CM | POA: Diagnosis not present

## 2014-12-21 DIAGNOSIS — N186 End stage renal disease: Secondary | ICD-10-CM | POA: Diagnosis not present

## 2014-12-23 DIAGNOSIS — D631 Anemia in chronic kidney disease: Secondary | ICD-10-CM | POA: Diagnosis not present

## 2014-12-23 DIAGNOSIS — N186 End stage renal disease: Secondary | ICD-10-CM | POA: Diagnosis not present

## 2014-12-27 DIAGNOSIS — N186 End stage renal disease: Secondary | ICD-10-CM | POA: Diagnosis not present

## 2014-12-27 DIAGNOSIS — D631 Anemia in chronic kidney disease: Secondary | ICD-10-CM | POA: Diagnosis not present

## 2014-12-28 DIAGNOSIS — N186 End stage renal disease: Secondary | ICD-10-CM | POA: Diagnosis not present

## 2014-12-28 DIAGNOSIS — D631 Anemia in chronic kidney disease: Secondary | ICD-10-CM | POA: Diagnosis not present

## 2014-12-30 DIAGNOSIS — D631 Anemia in chronic kidney disease: Secondary | ICD-10-CM | POA: Diagnosis not present

## 2014-12-30 DIAGNOSIS — N186 End stage renal disease: Secondary | ICD-10-CM | POA: Diagnosis not present

## 2015-01-01 ENCOUNTER — Other Ambulatory Visit: Payer: Self-pay | Admitting: Internal Medicine

## 2015-01-01 DIAGNOSIS — B20 Human immunodeficiency virus [HIV] disease: Secondary | ICD-10-CM

## 2015-01-02 DIAGNOSIS — D631 Anemia in chronic kidney disease: Secondary | ICD-10-CM | POA: Diagnosis not present

## 2015-01-02 DIAGNOSIS — N186 End stage renal disease: Secondary | ICD-10-CM | POA: Diagnosis not present

## 2015-01-04 DIAGNOSIS — N186 End stage renal disease: Secondary | ICD-10-CM | POA: Diagnosis not present

## 2015-01-04 DIAGNOSIS — D631 Anemia in chronic kidney disease: Secondary | ICD-10-CM | POA: Diagnosis not present

## 2015-01-06 DIAGNOSIS — N186 End stage renal disease: Secondary | ICD-10-CM | POA: Diagnosis not present

## 2015-01-06 DIAGNOSIS — D631 Anemia in chronic kidney disease: Secondary | ICD-10-CM | POA: Diagnosis not present

## 2015-01-09 DIAGNOSIS — D631 Anemia in chronic kidney disease: Secondary | ICD-10-CM | POA: Diagnosis not present

## 2015-01-09 DIAGNOSIS — Z992 Dependence on renal dialysis: Secondary | ICD-10-CM | POA: Diagnosis not present

## 2015-01-09 DIAGNOSIS — N186 End stage renal disease: Secondary | ICD-10-CM | POA: Diagnosis not present

## 2015-01-11 ENCOUNTER — Other Ambulatory Visit: Payer: Self-pay | Admitting: Internal Medicine

## 2015-01-11 DIAGNOSIS — N186 End stage renal disease: Secondary | ICD-10-CM | POA: Diagnosis not present

## 2015-01-11 DIAGNOSIS — D631 Anemia in chronic kidney disease: Secondary | ICD-10-CM | POA: Diagnosis not present

## 2015-01-13 DIAGNOSIS — D631 Anemia in chronic kidney disease: Secondary | ICD-10-CM | POA: Diagnosis not present

## 2015-01-13 DIAGNOSIS — N186 End stage renal disease: Secondary | ICD-10-CM | POA: Diagnosis not present

## 2015-01-16 DIAGNOSIS — N186 End stage renal disease: Secondary | ICD-10-CM | POA: Diagnosis not present

## 2015-01-16 DIAGNOSIS — D631 Anemia in chronic kidney disease: Secondary | ICD-10-CM | POA: Diagnosis not present

## 2015-01-18 DIAGNOSIS — N186 End stage renal disease: Secondary | ICD-10-CM | POA: Diagnosis not present

## 2015-01-18 DIAGNOSIS — D631 Anemia in chronic kidney disease: Secondary | ICD-10-CM | POA: Diagnosis not present

## 2015-01-19 ENCOUNTER — Other Ambulatory Visit: Payer: Self-pay | Admitting: *Deleted

## 2015-01-19 DIAGNOSIS — Z113 Encounter for screening for infections with a predominantly sexual mode of transmission: Secondary | ICD-10-CM

## 2015-01-20 ENCOUNTER — Emergency Department (HOSPITAL_BASED_OUTPATIENT_CLINIC_OR_DEPARTMENT_OTHER): Payer: Medicare Other

## 2015-01-20 ENCOUNTER — Encounter (HOSPITAL_BASED_OUTPATIENT_CLINIC_OR_DEPARTMENT_OTHER): Payer: Self-pay

## 2015-01-20 ENCOUNTER — Emergency Department (HOSPITAL_BASED_OUTPATIENT_CLINIC_OR_DEPARTMENT_OTHER)
Admission: EM | Admit: 2015-01-20 | Discharge: 2015-01-21 | Disposition: A | Discharge status: Home / Self Care | Payer: Medicare Other | Attending: Emergency Medicine | Admitting: Emergency Medicine

## 2015-01-20 DIAGNOSIS — J111 Influenza due to unidentified influenza virus with other respiratory manifestations: Secondary | ICD-10-CM | POA: Insufficient documentation

## 2015-01-20 DIAGNOSIS — D631 Anemia in chronic kidney disease: Secondary | ICD-10-CM | POA: Diagnosis not present

## 2015-01-20 DIAGNOSIS — B20 Human immunodeficiency virus [HIV] disease: Secondary | ICD-10-CM | POA: Diagnosis not present

## 2015-01-20 DIAGNOSIS — A419 Sepsis, unspecified organism: Secondary | ICD-10-CM | POA: Diagnosis not present

## 2015-01-20 DIAGNOSIS — Z8669 Personal history of other diseases of the nervous system and sense organs: Secondary | ICD-10-CM | POA: Insufficient documentation

## 2015-01-20 DIAGNOSIS — Z862 Personal history of diseases of the blood and blood-forming organs and certain disorders involving the immune mechanism: Secondary | ICD-10-CM | POA: Insufficient documentation

## 2015-01-20 DIAGNOSIS — R059 Cough, unspecified: Secondary | ICD-10-CM

## 2015-01-20 DIAGNOSIS — N186 End stage renal disease: Secondary | ICD-10-CM | POA: Diagnosis not present

## 2015-01-20 DIAGNOSIS — Z79899 Other long term (current) drug therapy: Secondary | ICD-10-CM | POA: Diagnosis not present

## 2015-01-20 DIAGNOSIS — R011 Cardiac murmur, unspecified: Secondary | ICD-10-CM | POA: Diagnosis not present

## 2015-01-20 DIAGNOSIS — I120 Hypertensive chronic kidney disease with stage 5 chronic kidney disease or end stage renal disease: Secondary | ICD-10-CM | POA: Insufficient documentation

## 2015-01-20 DIAGNOSIS — R0602 Shortness of breath: Secondary | ICD-10-CM | POA: Diagnosis not present

## 2015-01-20 DIAGNOSIS — R000 Tachycardia, unspecified: Secondary | ICD-10-CM | POA: Insufficient documentation

## 2015-01-20 DIAGNOSIS — Z992 Dependence on renal dialysis: Secondary | ICD-10-CM | POA: Diagnosis not present

## 2015-01-20 DIAGNOSIS — R05 Cough: Secondary | ICD-10-CM

## 2015-01-20 DIAGNOSIS — R509 Fever, unspecified: Secondary | ICD-10-CM | POA: Diagnosis not present

## 2015-01-20 LAB — CBC WITH DIFFERENTIAL/PLATELET
BASOS PCT: 0 % (ref 0–1)
Basophils Absolute: 0 10*3/uL (ref 0.0–0.1)
EOS PCT: 0 % (ref 0–5)
Eosinophils Absolute: 0 10*3/uL (ref 0.0–0.7)
HCT: 42.3 % (ref 39.0–52.0)
Hemoglobin: 14 g/dL (ref 13.0–17.0)
Lymphocytes Relative: 39 % (ref 12–46)
Lymphs Abs: 3 10*3/uL (ref 0.7–4.0)
MCH: 35.5 pg — ABNORMAL HIGH (ref 26.0–34.0)
MCHC: 33.1 g/dL (ref 30.0–36.0)
MCV: 107.4 fL — ABNORMAL HIGH (ref 78.0–100.0)
MONO ABS: 1.2 10*3/uL — AB (ref 0.1–1.0)
Monocytes Relative: 16 % — ABNORMAL HIGH (ref 3–12)
NEUTROS ABS: 3.4 10*3/uL (ref 1.7–7.7)
NEUTROS PCT: 45 % (ref 43–77)
Platelets: 177 10*3/uL (ref 150–400)
RBC: 3.94 MIL/uL — ABNORMAL LOW (ref 4.22–5.81)
RDW: 15.4 % (ref 11.5–15.5)
WBC: 7.7 10*3/uL (ref 4.0–10.5)

## 2015-01-20 LAB — COMPREHENSIVE METABOLIC PANEL
ALBUMIN: 4.1 g/dL (ref 3.5–5.2)
ALK PHOS: 76 U/L (ref 39–117)
ALT: 32 U/L (ref 0–53)
AST: 43 U/L — ABNORMAL HIGH (ref 0–37)
Anion gap: 16 — ABNORMAL HIGH (ref 5–15)
BILIRUBIN TOTAL: 0.4 mg/dL (ref 0.3–1.2)
BUN: 42 mg/dL — AB (ref 6–23)
CHLORIDE: 92 mmol/L — AB (ref 96–112)
CO2: 27 mmol/L (ref 19–32)
Calcium: 8.5 mg/dL (ref 8.4–10.5)
Creatinine, Ser: 12.31 mg/dL — ABNORMAL HIGH (ref 0.50–1.35)
GFR calc non Af Amer: 4 mL/min — ABNORMAL LOW (ref >90)
GFR, EST AFRICAN AMERICAN: 5 mL/min — AB (ref >90)
GLUCOSE: 89 mg/dL (ref 70–99)
POTASSIUM: 4.5 mmol/L (ref 3.5–5.1)
Sodium: 135 mmol/L (ref 135–145)
Total Protein: 8.9 g/dL — ABNORMAL HIGH (ref 6.0–8.3)

## 2015-01-20 LAB — I-STAT CG4 LACTIC ACID, ED: Lactic Acid, Venous: 2.06 mmol/L (ref 0.5–2.0)

## 2015-01-20 MED ORDER — SODIUM CHLORIDE 0.9 % IV BOLUS (SEPSIS)
500.0000 mL | Freq: Once | INTRAVENOUS | Status: AC
  Administered 2015-01-20: 500 mL via INTRAVENOUS

## 2015-01-20 MED ORDER — OSELTAMIVIR PHOSPHATE 75 MG PO CAPS
75.0000 mg | ORAL_CAPSULE | Freq: Once | ORAL | Status: AC
  Administered 2015-01-20: 75 mg via ORAL
  Filled 2015-01-20: qty 1

## 2015-01-20 MED ORDER — SODIUM CHLORIDE 0.9 % IV BOLUS (SEPSIS)
500.0000 mL | Freq: Once | INTRAVENOUS | Status: AC
Start: 2015-01-20 — End: 2015-01-20
  Administered 2015-01-20: 500 mL via INTRAVENOUS

## 2015-01-20 MED ORDER — OSELTAMIVIR PHOSPHATE 75 MG PO CAPS: 75.0000 mg | ORAL_CAPSULE | Freq: Every day | ORAL | Status: DC

## 2015-01-20 MED ORDER — ACETAMINOPHEN 325 MG PO TABS
650.0000 mg | ORAL_TABLET | Freq: Once | ORAL | Status: AC
  Administered 2015-01-20: 650 mg via ORAL
  Filled 2015-01-20: qty 2

## 2015-01-20 NOTE — ED Notes (Signed)
Pt wants to leave, EDP at [facility]. Friend at [facility].

## 2015-01-20 NOTE — ED Notes (Signed)
Pt wanting to leave, discussed with EDP, still wanting to leave despite encouragement and rationale to stay. Agreeable to signing AMA form.

## 2015-01-20 NOTE — ED Notes (Addendum)
Pt reports 2 day history of fever, cough, chills, body aches, headache, sore throat, diarrhea. States he went to HD today and could only run a half cycle due to hypotension. Pt also reports flu outbreak at dialysis center. Pt reports left arm fistula - states Tylenol given at 1300 today at HD Center. Pt requesting his HIV status be kept confidential if anyone is in the room with him. Pt triaged privately.

## 2015-01-20 NOTE — ED Notes (Signed)
BP remains low, IVF infusing, no changes, alert, NAD, calm, interactive, appropriate.

## 2015-01-20 NOTE — ED Provider Notes (Addendum)
CSN: [id number]     Arrival date & time 01/20/15  1826 History  This chart was scribed for Dylan Dessert, MD by Delphia Grates, ED Scribe. This patient was seen in room MH03/MH03 and the patient's care was started at 7:05 PM.    Chief Complaint  Patient presents with  . Influenza   The history is provided by the patient. No language interpreter was used.     HPI Comments: Dylan Barber. is a 39 y.o. male, with history of HTN, renal disorder, seizure, and HIV, who presents to the Emergency Department complaining of fever (triage temp 100.5 F) for the past 2 days. He is a dialysis patient who receives treatment every Monday, Wednesday, and Friday. He reports his last treatment was today (Friday), however, he was only able to run a half a cycle (~1.5 hours) due to low blood pressure. Patient states that while, he was informed he had a temperature of 102.7 F and was recommended that he go the ED to be evaluated. Patient is concerned he may have the flu, stating 20 dialysis patients have been diagnosed with flu at the same facility. He reports his symptoms started two days ago, after his dialysis, with nausea and diarrhea, but was unconcerned as he will sometimes experience these symptoms after receiving treatment. However, he report his symptoms persisted and became progressively worse with associated  He reports associated productive painful cough, SOB, chills, diarrhea, generalized body aches, and decreased appetite. He repots a total of 10 episodes of diarrhea daily. Patient reports he produces very little urine and only with defecation. He denies vomiting, abdominal pain, hematochezia, dysuria. He has received the flu vaccination. He states he has been a dialysis patient for several years and notes family history of kidney disease. Patient reports an undetectable viral load, last checked 2 weeks ago, however, he is unsure of CD4 count. He is currently not on any antibiotics, and is compliant  with all other medications. He denies history of asthma. Patient is a nonsmoker and denies EtOH consumption. Last Tylenol 6 hours ago at 1300.  Past Medical History  Diagnosis Date  . Hypertension   . Dialysis patient   . Renal disorder   . HIV disease   . Anemia   . Seizure   . Heart murmur   . Lymphocytosis 06/16/2014   Past Surgical History  Procedure Laterality Date  . Av fistula placement Left 05/07/2013    Procedure: ARTERIOVENOUS (AV) FISTULA CREATION- LEFT;  Surgeon: Rosetta Posner, MD;  Location: [facility];  Service: Vascular;  Laterality: Left;  . Insertion of dialysis catheter      x 2  . Av fistula placement Left 08/12/2013    Procedure: ARTERIOVENOUS (AV) FISTULA CREATION BRACHIOCEPHALIC;  Surgeon: Angelia Mould, MD;  Location: [facility];  Service: Vascular;  Laterality: Left;  . Ligation of arteriovenous  fistula Left 08/12/2013    Procedure: LIGATION OF ARTERIOVENOUS  FISTULA RADIOCEPHALIC;  Surgeon: Angelia Mould, MD;  Location: [facility] OR;  Service: Vascular;  Laterality: Left;   Family History  Problem Relation Age of Onset  . Kidney failure Mother   . Kidney disease Mother   . Kidney failure Maternal Uncle   . Kidney disease Paternal Uncle   . Kidney disease Maternal Grandfather    History  Substance Use Topics  . Smoking status: Never Smoker   . Smokeless tobacco: Never Used  . Alcohol Use: No    Review of Systems  Constitutional: Positive for  fever, chills and appetite change.  Respiratory: Positive for cough and shortness of breath.   Musculoskeletal: Positive for myalgias.  Neurological: Positive for headaches.  All other systems reviewed and are negative.     Allergies  Codeine; Eggs or egg-derived products; Heparin; and Mercury  Home Medications   Prior to Admission medications   Medication Sig Start Date End Date Taking? Authorizing Provider  abacavir (ZIAGEN) 300 MG tablet Take 600 mg by mouth daily at 12 noon.   Yes Historical Provider,  MD  carvedilol (COREG) 6.25 MG tablet Take 6.25 mg by mouth daily at 12 noon.   Yes Historical Provider, MD  diphenhydrAMINE (BENADRYL) 25 MG tablet Take 25 mg by mouth every 6 (six) hours as needed for itching. During dialysis   Yes Historical Provider, MD  lamivudine (EPIVIR) 100 MG tablet TAKE 1/2 TABLET BY MOUTH ONCE DAILY 01/02/15  Yes Thayer Headings, MD  Multiple Vitamin (MULTIVITAMIN) tablet Take 1 tablet by mouth daily.   Yes Historical Provider, MD  NORVIR 100 MG TABS tablet TAKE 1 TABLET BY MOUTH DAILY WITH BREAKFAST 01/02/15  Yes Thayer Headings, MD  PREZISTA 800 MG tablet TAKE 1 TABLET BY MOUTH DAILY WITH BREAKFAST 01/02/15  Yes Thayer Headings, MD  diphenoxylate-atropine (LOMOTIL) 2.5-0.025 MG per tablet Take 1 tablet by mouth 4 (four) times daily as needed for diarrhea or loose stools. 11/16/14   Dalia Heading, PA-C  promethazine (PHENERGAN) 25 MG suppository Place 1 suppository (25 mg total) rectally every 6 (six) hours as needed for nausea or vomiting. 11/16/14   Dalia Heading, PA-C  promethazine (PHENERGAN) 25 MG tablet Take 1 tablet (25 mg total) by mouth every 8 (eight) hours as needed for nausea or vomiting. 11/16/14   Dalia Heading, PA-C   Triage Vitals: BP 101/49 mmHg  Pulse 131  Temp(Src) 100.5 F (38.1 C) (Oral)  Resp 16  Ht 5\' 7"  (1.702 m)  Wt 170 lb (77.111 kg)  BMI 26.62 kg/m2  SpO2 100%  Physical Exam  Constitutional: He is oriented to person, place, and time. He appears well-developed and well-nourished. No distress.  HENT:  Head: Normocephalic and atraumatic.  Right Ear: Tympanic membrane, external ear and ear canal normal.  Left Ear: Tympanic membrane, external ear and ear canal normal.  Mouth/Throat: Posterior oropharyngeal erythema present.  Eyes: Conjunctivae and EOM are normal.  Neck: Neck supple. No tracheal deviation present.  Cardiovascular: Regular rhythm.  Tachycardia present.   Pulmonary/Chest: Effort normal and breath sounds normal. No  respiratory distress.  Musculoskeletal: Normal range of motion.  Left upper extremity has a fistula with a palpable thrill.  Neurological: He is alert and oriented to person, place, and time.  Skin: Skin is warm and dry.  Psychiatric: He has a normal mood and affect. His behavior is normal.  Nursing note and vitals reviewed.   ED Course  Procedures (including critical care time)  DIAGNOSTIC STUDIES: Oxygen Saturation is 100% on room air, normal by my interpretation.    COORDINATION OF CARE: At 1914 Discussed treatment plan with patient which includes CXR, Tylenol, and labs. Patient agrees.   Labs Review Labs Reviewed  CBC WITH DIFFERENTIAL/PLATELET - Abnormal; Notable for the following:    RBC 3.94 (*)    MCV 107.4 (*)    MCH 35.5 (*)    Monocytes Relative 16 (*)    Monocytes Absolute 1.2 (*)    All other components within normal limits  COMPREHENSIVE METABOLIC PANEL - Abnormal; Notable for the following:  Chloride 92 (*)    BUN 42 (*)    Creatinine, Ser 12.31 (*)    Total Protein 8.9 (*)    AST 43 (*)    GFR calc non Af Amer 4 (*)    GFR calc Af Amer 5 (*)    Anion gap 16 (*)    All other components within normal limits  I-STAT CG4 LACTIC ACID, ED - Abnormal; Notable for the following:    Lactic Acid, Venous 2.06 (*)    All other components within normal limits  CULTURE, BLOOD (ROUTINE X 2)  CULTURE, BLOOD (ROUTINE X 2)  URINALYSIS, ROUTINE W REFLEX MICROSCOPIC    Imaging Review Dg Chest 2 View  01/20/2015   CLINICAL DATA:  Acute onset of productive cough, fever, diarrhea and shortness of breath. Hypertension. Initial encounter.  EXAM: CHEST  2 VIEW  COMPARISON:  Chest radiograph performed 03/24/2014, and CTA of the chest performed 03/27/2014  FINDINGS: The lungs are well-aerated. Minimal right midlung hazy opacity may reflect recurrent mild interstitial prominence as noted on prior studies. There is no evidence of pleural effusion or pneumothorax.  The heart is  normal in size; the mediastinal contour is within normal limits. No acute osseous abnormalities are seen.  IMPRESSION: Minimal right mid lung hazy opacity may reflect recurrent mild interstitial prominence, as noted on prior studies. This is less prominent than in 2015, and could reflect a mild infectious process or minimal interstitial edema.   Electronically Signed   By: Garald Balding M.D.   On: 01/20/2015 19:55     EKG Interpretation None      MDM   Final diagnoses:  Cough  Sepsis, due to unspecified organism  Influenza   patient with a history of multiple medical problems including HIV, end-stage renal disease on dialysis, hypertension who presents today with flulike illness that started yesterday. Fever, cough, generalized body aches, decreased appetite, diarrhea. He was seen at dialysis today but can only complete half of course due to hypotension. Last time patient with blood pressure medication was yesterday. Upon arrival here blood pressure was 101/49 with a heart rate of 1:30 and a temperature of 100.5. Throughout his stay patient's blood pressure remained low mostly between [id number] and [id number] systolic. Chest x-ray without new evidence of infiltrate but a persistent infiltrate from 2015 which was improved in size.    Concern the patient is septic with a bacteremia versus flu. Patient does have HIV however viral loads are undetectable and he takes his medication regularly. Patient given 1 L of fluid with persistent blood pressures in the 80s. Discussed with patient the need for admission to treat for possible sepsis versus flu. Patient and his partner are present in the room. He is of his right mind and is able to make sound decisions. He is choosing to sign out AMA. Patient understands that his condition is serious and that things could become worse quickly. Discussed with him the possibility of deterioration requiring ICU stays, intubation, possible death. Patient and his partner voice  understanding. His partner will also check his blood pressure and patient states of blood pressure does not improve by tomorrow he will return to the hospital. Patient was started on Tamiflu. Blood cultures are pending  I personally performed the services described in this documentation, which was scribed in my presence.  The recorded information has been reviewed and considered.   4:27 PM Called pt to see how he was doing and he states pressure has improved and fever  has subsided.  He feels much better and is still taking the tamiflu Dylan Dessert, MD 01/20/15 [id number]  Dylan Dessert, MD 01/22/15 [id number]

## 2015-01-20 NOTE — ED Notes (Signed)
EDP finished at [facility], orders received and initiated, pt alert, NAD, calm, interactive, texting, resps e/u, no dyspnea noted, BP low, HR elevated, will continue to monitor, roommate into room at [facility]. States, "feel like flu sx are getting worse, onset 2d ago".

## 2015-01-20 NOTE — ED Notes (Signed)
Back from b/r and xray, unable to produce urine, alert, NAD, calm, no dyspnea, steady gait.

## 2015-01-21 NOTE — ED Notes (Signed)
Encouraged to stay, rationale given, VS explained, "still want to leave". Friend at [facility].

## 2015-01-23 DIAGNOSIS — D631 Anemia in chronic kidney disease: Secondary | ICD-10-CM | POA: Diagnosis not present

## 2015-01-23 DIAGNOSIS — N186 End stage renal disease: Secondary | ICD-10-CM | POA: Diagnosis not present

## 2015-01-25 DIAGNOSIS — D631 Anemia in chronic kidney disease: Secondary | ICD-10-CM | POA: Diagnosis not present

## 2015-01-25 DIAGNOSIS — N186 End stage renal disease: Secondary | ICD-10-CM | POA: Diagnosis not present

## 2015-01-27 DIAGNOSIS — D631 Anemia in chronic kidney disease: Secondary | ICD-10-CM | POA: Diagnosis not present

## 2015-01-27 DIAGNOSIS — N186 End stage renal disease: Secondary | ICD-10-CM | POA: Diagnosis not present

## 2015-01-27 LAB — CULTURE, BLOOD (ROUTINE X 2)
CULTURE: NO GROWTH
Culture: NO GROWTH

## 2015-01-31 DIAGNOSIS — N186 End stage renal disease: Secondary | ICD-10-CM | POA: Diagnosis not present

## 2015-01-31 DIAGNOSIS — D631 Anemia in chronic kidney disease: Secondary | ICD-10-CM | POA: Diagnosis not present

## 2015-02-01 DIAGNOSIS — N186 End stage renal disease: Secondary | ICD-10-CM | POA: Diagnosis not present

## 2015-02-01 DIAGNOSIS — D631 Anemia in chronic kidney disease: Secondary | ICD-10-CM | POA: Diagnosis not present

## 2015-02-03 DIAGNOSIS — N186 End stage renal disease: Secondary | ICD-10-CM | POA: Diagnosis not present

## 2015-02-03 DIAGNOSIS — D631 Anemia in chronic kidney disease: Secondary | ICD-10-CM | POA: Diagnosis not present

## 2015-02-06 DIAGNOSIS — D631 Anemia in chronic kidney disease: Secondary | ICD-10-CM | POA: Diagnosis not present

## 2015-02-06 DIAGNOSIS — N186 End stage renal disease: Secondary | ICD-10-CM | POA: Diagnosis not present

## 2015-02-07 ENCOUNTER — Ambulatory Visit (INDEPENDENT_AMBULATORY_CARE_PROVIDER_SITE_OTHER): Payer: Medicare Other | Admitting: Interventional Cardiology

## 2015-02-07 ENCOUNTER — Encounter: Payer: Self-pay | Admitting: Interventional Cardiology

## 2015-02-07 VITALS — BP 102/64 | HR 94 | Ht 67.0 in | Wt 173.0 lb

## 2015-02-07 DIAGNOSIS — I10 Essential (primary) hypertension: Secondary | ICD-10-CM | POA: Diagnosis not present

## 2015-02-07 DIAGNOSIS — R0602 Shortness of breath: Secondary | ICD-10-CM | POA: Diagnosis not present

## 2015-02-07 DIAGNOSIS — R002 Palpitations: Secondary | ICD-10-CM

## 2015-02-07 MED ORDER — CARVEDILOL 6.25 MG PO TABS: 6.2500 mg | ORAL_TABLET | Freq: Two times a day (BID) | ORAL | Status: DC

## 2015-02-07 NOTE — Patient Instructions (Addendum)
**Note De-Identified Felicita Nuncio Obfuscation** Your physician has recommended you make the following change in your medication: increase Carvedilol to 6.25 mg twice daily as directed by Dr Irish Lack  Your physician has requested that you have an echocardiogram. Echocardiography is a painless test that uses sound waves to create images of your heart. It provides your doctor with information about the size and shape of your heart and how well your heart's chambers and valves are working. This procedure takes approximately one hour. There are no restrictions for this procedure.  Your physician recommends that you schedule a follow-up appointment in: 2 months

## 2015-02-07 NOTE — Progress Notes (Signed)
Patient ID: Dylan Grahn., male   DOB: [birth year], 39 y.o.   MRN: [id number]     Cardiology Office Note   Date:  02/07/2015   ID:  Dylan Kemna., DOB 1975-12-21, MRN [id number]  PCP:  No PCP Per Patient  Cardiologist:   Jettie Booze., MD   No chief complaint on file. tachycardia    History of Present Illness: Dylan Bernd. is a 39 y.o. male who presents for evaluation of palpitations.  He has been on HD for ESRD due to HTN and family h/o kidney disease.  He denies chest pain.  He has had tachypalpitations for at least a year.  He was started on Carvedilol.  At dialysis, his HR will be in the 120-140 range.  He does not take the Carvedilol on dialysis days before treatment.  he is only been taking the carvedilol once a day.  He will feel palpitations even outside of dialysis.  He feels a fast beat with a short walk also.  He feels some SHOB with that.   He has a h/o anemia due to parvovirus. He had a transfusion  But his hemoglobin has normalized since then.    Past Medical History  Diagnosis Date  . Hypertension   . Dialysis patient   . Renal disorder   . HIV disease   . Anemia   . Seizure   . Heart murmur   . Lymphocytosis 06/16/2014    Past Surgical History  Procedure Laterality Date  . Av fistula placement Left 05/07/2013    Procedure: ARTERIOVENOUS (AV) FISTULA CREATION- LEFT;  Surgeon: Rosetta Posner, MD;  Location: [facility];  Service: Vascular;  Laterality: Left;  . Insertion of dialysis catheter      x 2  . Av fistula placement Left 08/12/2013    Procedure: ARTERIOVENOUS (AV) FISTULA CREATION BRACHIOCEPHALIC;  Surgeon: Angelia Mould, MD;  Location: [facility];  Service: Vascular;  Laterality: Left;  . Ligation of arteriovenous  fistula Left 08/12/2013    Procedure: LIGATION OF ARTERIOVENOUS  FISTULA RADIOCEPHALIC;  Surgeon: Angelia Mould, MD;  Location: [facility];  Service: Vascular;  Laterality: Left;     Current Outpatient  Prescriptions  Medication Sig Dispense Refill  . abacavir (ZIAGEN) 300 MG tablet Take 600 mg by mouth daily at 12 noon.    . diphenhydrAMINE (BENADRYL) 25 MG tablet Take 25 mg by mouth every 6 (six) hours as needed for itching. During dialysis    . lamivudine (EPIVIR) 100 MG tablet TAKE 1/2 TABLET BY MOUTH ONCE DAILY 15 tablet 1  . Multiple Vitamin (MULTIVITAMIN) tablet Take 1 tablet by mouth daily.    . NORVIR 100 MG TABS tablet TAKE 1 TABLET BY MOUTH DAILY WITH BREAKFAST 30 tablet 1  . PREZISTA 800 MG tablet TAKE 1 TABLET BY MOUTH DAILY WITH BREAKFAST 30 tablet 1  . carvedilol (COREG) 6.25 MG tablet Take 1 tablet (6.25 mg total) by mouth 2 (two) times daily. 60 tablet 3   No current facility-administered medications for this visit.    Allergies:   Codeine; Eggs or egg-derived products; Heparin; and Mercury    Social History:  The patient  reports that he has never smoked. He has never used smokeless tobacco. He reports that he does not drink alcohol or use illicit drugs.   Family History:  The patient's family history includes Kidney disease in his maternal grandfather, mother, and paternal uncle; Kidney failure in his maternal uncle and mother.  ROS:  Please see the history of present illness.   Otherwise, review of systems are positive for palpitations.   All other systems are reviewed and negative.    PHYSICAL EXAM: VS:  BP 102/64 mmHg  Pulse 94  Ht 5\' 7"  (1.702 m)  Wt 173 lb (78.472 kg)  BMI 27.09 kg/m2 , BMI Body mass index is 27.09 kg/(m^2). GEN: Well nourished, well developed, in no acute distress HEENT: normal Neck: no JVD, carotid bruits, or masses Cardiac: RRR; no murmurs, rubs, or gallops,no edema  Respiratory:  clear to auscultation bilaterally, normal work of breathing GI: soft, nontender, nondistended, + BS MS: no deformity or atrophy Skin: warm and dry, no rash Neuro:  Strength and sensation are intact Psych: euthymic mood, full affect   EKG:  EKG is  ordered today. The ekg ordered today demonstrates  Normal sinus rhythm, nonspecific ST segment changes   Recent Labs: 03/27/2014: Magnesium 2.8* 01/20/2015: ALT 32; BUN 42*; Creatinine 12.31*; Hemoglobin 14.0; Platelets 177; Potassium 4.5; Sodium 135    Lipid Panel    Component Value Date/Time   CHOL 131 07/08/2013 1113   TRIG 231* 07/08/2013 1113   HDL 35* 07/08/2013 1113   CHOLHDL 3.7 07/08/2013 1113   VLDL 46* 07/08/2013 1113   LDLCALC 50 07/08/2013 1113      Wt Readings from Last 3 Encounters:  02/07/15 173 lb (78.472 kg)  01/20/15 170 lb (77.111 kg)  11/05/14 160 lb (72.576 kg)      Other studies Reviewed: Additional studies/ records that were reviewed today include:  Prior ECG from 2015. Review of the above records demonstrates:  Sinus tachycardia   ASSESSMENT AND PLAN:  1.   Sinus tachycardia /palpitations: he is monitored regularly with dialysis. No mention of any other arrhythmia. Will increase carvedilol 26.25 milligrams by mouth twice a day given that this will give him better coverage during the day. He will hold his dose prior to dialysis. He will take the evening dose on those days. Check echocardiogram to evaluate for any structural heart disease , particularly given that he has had some shortness of breath.   At this point, I don't think a monitor would be helpful. If he has more symptoms  That are different from what he has a dialysis, could consider an event monitor.   Current medicines are reviewed at length with the patient today.  The patient has concerns regarding medicines.  The following changes have been made:   Change carvedilol to 6.25 mg by mouth twice a day as directed  Labs/ tests ordered today include:  echocardiogram   Orders Placed This Encounter  Procedures  . EKG 12-Lead  . 2D Echocardiogram without contrast     Disposition:   FU with me in 4 months   Signed, Jettie Booze., MD  02/07/2015 3:03 PM    [facility] [address], [place], [place]  [address] Phone: [phone]; Fax: [phone]

## 2015-02-08 DIAGNOSIS — I871 Compression of vein: Secondary | ICD-10-CM | POA: Diagnosis not present

## 2015-02-08 DIAGNOSIS — T82858D Stenosis of vascular prosthetic devices, implants and grafts, subsequent encounter: Secondary | ICD-10-CM | POA: Diagnosis not present

## 2015-02-08 DIAGNOSIS — N186 End stage renal disease: Secondary | ICD-10-CM | POA: Diagnosis not present

## 2015-02-08 DIAGNOSIS — Z992 Dependence on renal dialysis: Secondary | ICD-10-CM | POA: Diagnosis not present

## 2015-02-09 DIAGNOSIS — D631 Anemia in chronic kidney disease: Secondary | ICD-10-CM | POA: Diagnosis not present

## 2015-02-09 DIAGNOSIS — N186 End stage renal disease: Secondary | ICD-10-CM | POA: Diagnosis not present

## 2015-02-09 DIAGNOSIS — R000 Tachycardia, unspecified: Secondary | ICD-10-CM | POA: Diagnosis not present

## 2015-02-09 DIAGNOSIS — Z992 Dependence on renal dialysis: Secondary | ICD-10-CM | POA: Diagnosis not present

## 2015-02-11 DIAGNOSIS — N186 End stage renal disease: Secondary | ICD-10-CM | POA: Diagnosis not present

## 2015-02-11 DIAGNOSIS — N2581 Secondary hyperparathyroidism of renal origin: Secondary | ICD-10-CM | POA: Diagnosis not present

## 2015-02-14 ENCOUNTER — Other Ambulatory Visit: Payer: Medicare Other

## 2015-02-15 DIAGNOSIS — N186 End stage renal disease: Secondary | ICD-10-CM | POA: Diagnosis not present

## 2015-02-15 DIAGNOSIS — D631 Anemia in chronic kidney disease: Secondary | ICD-10-CM | POA: Diagnosis not present

## 2015-02-16 ENCOUNTER — Ambulatory Visit (HOSPITAL_COMMUNITY): Payer: Medicare Other | Attending: Cardiology | Admitting: Radiology

## 2015-02-16 DIAGNOSIS — R0602 Shortness of breath: Secondary | ICD-10-CM | POA: Diagnosis not present

## 2015-02-16 NOTE — Progress Notes (Signed)
Echocardiogram performed.  

## 2015-02-17 DIAGNOSIS — D631 Anemia in chronic kidney disease: Secondary | ICD-10-CM | POA: Diagnosis not present

## 2015-02-17 DIAGNOSIS — N186 End stage renal disease: Secondary | ICD-10-CM | POA: Diagnosis not present

## 2015-02-20 ENCOUNTER — Other Ambulatory Visit: Payer: Self-pay | Admitting: Licensed Clinical Social Worker

## 2015-02-20 ENCOUNTER — Telehealth: Payer: Self-pay | Admitting: Licensed Clinical Social Worker

## 2015-02-20 DIAGNOSIS — D631 Anemia in chronic kidney disease: Secondary | ICD-10-CM | POA: Diagnosis not present

## 2015-02-20 DIAGNOSIS — N186 End stage renal disease: Secondary | ICD-10-CM | POA: Diagnosis not present

## 2015-02-20 DIAGNOSIS — B20 Human immunodeficiency virus [HIV] disease: Secondary | ICD-10-CM

## 2015-02-20 MED ORDER — DARUNAVIR ETHANOLATE 800 MG PO TABS: 800.0000 mg | ORAL_TABLET | Freq: Every day | ORAL | Status: DC

## 2015-02-20 MED ORDER — RITONAVIR 100 MG PO TABS: 100.0000 mg | ORAL_TABLET | Freq: Every day | ORAL | Status: DC

## 2015-02-20 MED ORDER — LAMIVUDINE 100 MG PO TABS: 50.0000 mg | ORAL_TABLET | Freq: Every day | ORAL | Status: DC

## 2015-02-20 NOTE — Telephone Encounter (Signed)
Error

## 2015-02-20 NOTE — Telephone Encounter (Signed)
Patient called wanting refills of his medication, he has not been here since 06/2014 and was inconsistent then with medications. I called the patient and left a message to see how long he has been without medications and also to remind him to make an appointment to see a provider. He has a lab appointment for this week. Is it ok to refill?

## 2015-02-20 NOTE — Telephone Encounter (Signed)
Patient called back stating that he had been taking his medication every day and he just ran out Saturday. I advised the patient that I will only call in 30 day supply and he will need to keep his appointment for labs and office visit. Patient verbalized understanding.

## 2015-02-21 ENCOUNTER — Other Ambulatory Visit: Payer: Medicare Other

## 2015-02-21 ENCOUNTER — Other Ambulatory Visit: Payer: Self-pay | Admitting: Internal Medicine

## 2015-02-21 DIAGNOSIS — Z113 Encounter for screening for infections with a predominantly sexual mode of transmission: Secondary | ICD-10-CM

## 2015-02-21 DIAGNOSIS — Z79899 Other long term (current) drug therapy: Secondary | ICD-10-CM

## 2015-02-21 DIAGNOSIS — B20 Human immunodeficiency virus [HIV] disease: Secondary | ICD-10-CM | POA: Diagnosis not present

## 2015-02-21 LAB — CBC WITH DIFFERENTIAL/PLATELET
BASOS ABS: 0 10*3/uL (ref 0.0–0.1)
BASOS PCT: 0 % (ref 0–1)
EOS ABS: 0.3 10*3/uL (ref 0.0–0.7)
Eosinophils Relative: 4 % (ref 0–5)
HCT: 35.6 % — ABNORMAL LOW (ref 39.0–52.0)
Hemoglobin: 12.3 g/dL — ABNORMAL LOW (ref 13.0–17.0)
Lymphocytes Relative: 52 % — ABNORMAL HIGH (ref 12–46)
Lymphs Abs: 3.3 10*3/uL (ref 0.7–4.0)
MCH: 34 pg (ref 26.0–34.0)
MCHC: 34.6 g/dL (ref 30.0–36.0)
MCV: 98.3 fL (ref 78.0–100.0)
MONOS PCT: 8 % (ref 3–12)
MPV: 8.7 fL (ref 8.6–12.4)
Monocytes Absolute: 0.5 10*3/uL (ref 0.1–1.0)
NEUTROS ABS: 2.3 10*3/uL (ref 1.7–7.7)
Neutrophils Relative %: 36 % — ABNORMAL LOW (ref 43–77)
PLATELETS: 220 10*3/uL (ref 150–400)
RBC: 3.62 MIL/uL — AB (ref 4.22–5.81)
RDW: 14.8 % (ref 11.5–15.5)
WBC: 6.3 10*3/uL (ref 4.0–10.5)

## 2015-02-21 LAB — COMPLETE METABOLIC PANEL WITH GFR
ALT: 17 U/L (ref 0–53)
AST: 20 U/L (ref 0–37)
Albumin: 3.8 g/dL (ref 3.5–5.2)
Alkaline Phosphatase: 65 U/L (ref 39–117)
BILIRUBIN TOTAL: 0.3 mg/dL (ref 0.2–1.2)
BUN: 37 mg/dL — ABNORMAL HIGH (ref 6–23)
CO2: 30 meq/L (ref 19–32)
CREATININE: 11.7 mg/dL — AB (ref 0.50–1.35)
Calcium: 8.4 mg/dL (ref 8.4–10.5)
Chloride: 93 mEq/L — ABNORMAL LOW (ref 96–112)
GFR, Est African American: 6 mL/min — ABNORMAL LOW
GFR, Est Non African American: 5 mL/min — ABNORMAL LOW
GLUCOSE: 93 mg/dL (ref 70–99)
Potassium: 4.4 mEq/L (ref 3.5–5.3)
Sodium: 136 mEq/L (ref 135–145)
Total Protein: 7.8 g/dL (ref 6.0–8.3)

## 2015-02-21 LAB — LIPID PANEL
CHOL/HDL RATIO: 3.8 ratio
Cholesterol: 113 mg/dL (ref 0–200)
HDL: 30 mg/dL — AB (ref >=40)
LDL CALC: 54 mg/dL (ref 0–99)
TRIGLYCERIDES: 145 mg/dL (ref <150)
VLDL: 29 mg/dL (ref 0–40)

## 2015-02-22 DIAGNOSIS — N186 End stage renal disease: Secondary | ICD-10-CM | POA: Diagnosis not present

## 2015-02-22 DIAGNOSIS — D631 Anemia in chronic kidney disease: Secondary | ICD-10-CM | POA: Diagnosis not present

## 2015-02-22 LAB — T-HELPER CELL (CD4) - (RCID CLINIC ONLY)
CD4 T CELL ABS: 570 /uL (ref 400–2700)
CD4 T CELL HELPER: 17 % — AB (ref 33–55)

## 2015-02-22 LAB — RPR

## 2015-02-22 LAB — HIV-1 RNA QUANT-NO REFLEX-BLD
HIV 1 RNA Quant: 1183 copies/mL — ABNORMAL HIGH (ref <20)
HIV-1 RNA QUANT, LOG: 3.07 {Log} — AB (ref <1.30)

## 2015-02-24 ENCOUNTER — Telehealth: Payer: Self-pay | Admitting: Licensed Clinical Social Worker

## 2015-02-24 NOTE — Telephone Encounter (Signed)
Patient is going to stop medication, will ask Santiago Glad to add Genotype. Patient's next appointment is not until 5/26, and he doesn't want to wait that long before starting back medications. Should he come back sooner?

## 2015-02-24 NOTE — Telephone Encounter (Signed)
-----   Message from Thayer Headings, MD sent at 02/24/2015 12:31 PM EDT ----- Please add on a genotype and let patient know he should stop his regimen since he reported being on it prior to labs and therefore it is not working.  We will discuss new regimen when he comes for his appt when the genotype is back.  thanks

## 2015-02-27 ENCOUNTER — Other Ambulatory Visit: Payer: Self-pay | Admitting: Licensed Clinical Social Worker

## 2015-02-27 DIAGNOSIS — B20 Human immunodeficiency virus [HIV] disease: Secondary | ICD-10-CM

## 2015-02-27 NOTE — Telephone Encounter (Signed)
Yes, 2 weeks (to wait for genotype) with anyone. thanks

## 2015-03-01 DIAGNOSIS — N186 End stage renal disease: Secondary | ICD-10-CM | POA: Diagnosis not present

## 2015-03-01 DIAGNOSIS — D631 Anemia in chronic kidney disease: Secondary | ICD-10-CM | POA: Diagnosis not present

## 2015-03-03 DIAGNOSIS — D631 Anemia in chronic kidney disease: Secondary | ICD-10-CM | POA: Diagnosis not present

## 2015-03-03 DIAGNOSIS — N186 End stage renal disease: Secondary | ICD-10-CM | POA: Diagnosis not present

## 2015-03-06 ENCOUNTER — Encounter: Payer: Self-pay | Admitting: Infectious Disease

## 2015-03-06 ENCOUNTER — Ambulatory Visit (INDEPENDENT_AMBULATORY_CARE_PROVIDER_SITE_OTHER): Payer: Medicare Other | Admitting: Infectious Disease

## 2015-03-06 VITALS — BP 114/87 | HR 129 | Wt 169.8 lb

## 2015-03-06 DIAGNOSIS — N186 End stage renal disease: Secondary | ICD-10-CM | POA: Diagnosis not present

## 2015-03-06 DIAGNOSIS — B20 Human immunodeficiency virus [HIV] disease: Secondary | ICD-10-CM | POA: Diagnosis not present

## 2015-03-06 DIAGNOSIS — Z9119 Patient's noncompliance with other medical treatment and regimen: Secondary | ICD-10-CM

## 2015-03-06 DIAGNOSIS — Z992 Dependence on renal dialysis: Secondary | ICD-10-CM

## 2015-03-06 DIAGNOSIS — D631 Anemia in chronic kidney disease: Secondary | ICD-10-CM | POA: Diagnosis not present

## 2015-03-06 DIAGNOSIS — Z91199 Patient's noncompliance with other medical treatment and regimen due to unspecified reason: Secondary | ICD-10-CM

## 2015-03-06 HISTORY — DX: Patient's noncompliance with other medical treatment and regimen: Z91.19

## 2015-03-06 HISTORY — DX: Patient's noncompliance with other medical treatment and regimen due to unspecified reason: Z91.199

## 2015-03-06 MED ORDER — DARUNAVIR-COBICISTAT 800-150 MG PO TABS
1.0000 | ORAL_TABLET | Freq: Every day | ORAL | Status: DC
Start: 2015-03-06 — End: 2016-01-01

## 2015-03-06 MED ORDER — ZIDOVUDINE 300 MG PO TABS: 300.0000 mg | ORAL_TABLET | Freq: Every day | ORAL | Status: DC

## 2015-03-06 MED ORDER — DOLUTEGRAVIR SODIUM 50 MG PO TABS
50.0000 mg | ORAL_TABLET | Freq: Every day | ORAL | Status: DC
Start: 2015-03-06 — End: 2016-01-11

## 2015-03-06 MED ORDER — LAMIVUDINE 100 MG PO TABS: 50.0000 mg | ORAL_TABLET | Freq: Every day | ORAL | Status: DC

## 2015-03-06 MED ORDER — DARUNAVIR-COBICISTAT 800-150 MG PO TABS: 1.0000 | ORAL_TABLET | Freq: Every day | ORAL | Status: DC

## 2015-03-06 MED ORDER — DOLUTEGRAVIR SODIUM 50 MG PO TABS: 50.0000 mg | ORAL_TABLET | Freq: Every day | ORAL | Status: DC

## 2015-03-06 NOTE — Progress Notes (Signed)
   Subjective:    Patient ID: Dylan Barber., male    DOB: Apr 28, 1976, 39 y.o.   MRN: [id number]  HPI  39 year old with HIV disease, ESRD on HD who apparently has failed Prezista/Norvir, Abacavir and Epivir. He has had genotype but this is not back yet.  He claims that he missed meds 5 months ago when his insurance ran out. He claims to have always been taking all of his meds. He claims he has only been on ARV for past 2 years and always been on Prezista based regimen.  He did not wish to wait for appt with his ID PCP Dr. Linus Salmons to make change to his ARV so he came to clinic today.  Review of Systems  Constitutional: Negative for fever, chills, diaphoresis, activity change, appetite change, fatigue and unexpected weight change.  HENT: Negative for congestion, rhinorrhea, sinus pressure, sneezing, sore throat and trouble swallowing.   Eyes: Negative for photophobia and visual disturbance.  Respiratory: Negative for cough, chest tightness, shortness of breath, wheezing and stridor.   Cardiovascular: Negative for chest pain, palpitations and leg swelling.  Gastrointestinal: Negative for nausea, vomiting, abdominal pain, diarrhea, constipation, blood in stool, abdominal distention and anal bleeding.  Genitourinary: Negative for dysuria, hematuria, flank pain and difficulty urinating.  Musculoskeletal: Negative for myalgias, back pain, joint swelling, arthralgias and gait problem.  Skin: Negative for color change, pallor, rash and wound.  Neurological: Negative for dizziness, tremors, weakness and light-headedness.  Hematological: Negative for adenopathy. Does not bruise/bleed easily.  Psychiatric/Behavioral: Negative for behavioral problems, confusion, sleep disturbance, dysphoric mood, decreased concentration and agitation.       Objective:   Physical Exam  Constitutional: He is oriented to person, place, and time. He appears well-developed and well-nourished.  HENT:  Head:  Normocephalic and atraumatic.  Eyes: Conjunctivae and EOM are normal.  Neck: Normal range of motion. Neck supple.  Cardiovascular: Normal rate and regular rhythm.   Pulmonary/Chest: Effort normal. No respiratory distress. He has no wheezes.  Abdominal: Soft. He exhibits no distension.  Musculoskeletal: Normal range of motion. He exhibits no edema or tenderness.  Neurological: He is alert and oriented to person, place, and time.  Skin: Skin is warm and dry. No rash noted. No erythema. No pallor.  Psychiatric: He has a normal mood and affect. His behavior is normal. Judgment and thought content normal.          Assessment & Plan:   HIV disease with virological failure. HIGHLY doubtful he failed Prezista with PI resistance given he was naive patient.   For now will change him to Prezcobix, Tivicay daily with AZT 300mg  daily and 50mg  of Epivir  Will have him come back in one month for repeat VL, CD4 and visit with Dr. Linus Salmons afterwards  I spent greater than 40 minutes with the patient including greater than 50% of time in face to face counsel of the patient  Re how he had taken his prior ARV regimen and in describing in detail the new regimen that [facility] and I constructed for him  ESRD on HD: all of his drugs will be appropriated dose din HD. Note COBI not studies in HD but no mechanistic reason would not be ok to use and we know that Philis Fendt is studying GENVOYA in HD patients

## 2015-03-06 NOTE — Progress Notes (Signed)
Patient ID: Dylan Twaddle., male   DOB: 03/17/1976, 39 y.o.   MRN: [id number] HPI: Dylan Lemm. is a 39 y.o. male who is here for his f/u.  Allergies: Allergies  Allergen Reactions  . Codeine Itching  . Eggs Or Egg-Derived Products     Shortness of breath  . Heparin Itching    Severe itching  . Mercury     Childhood allergy    Vitals: BP: 114/87 mmHg (04/25 1501) Pulse Rate: 129 (04/25 1501)  Past Medical History: Past Medical History  Diagnosis Date  . Hypertension   . Dialysis patient   . Renal disorder   . HIV disease   . Anemia   . Seizure   . Heart murmur   . Lymphocytosis 06/16/2014    Social History: History   Social History  . Marital Status: Single    Spouse Name: N/A  . Number of Children: N/A  . Years of Education: N/A   Social History Main Topics  . Smoking status: Never Smoker   . Smokeless tobacco: Never Used  . Alcohol Use: No  . Drug Use: No  . Sexual Activity:    Partners: Female    Patent examiner Protection: Condom     Comment: pt. given condoms   Other Topics Concern  . None   Social History Narrative   Originally from [place],  States father worked at [employer].  Moved from [place], [place]. On 6/21 to live with father.     Previous Regimen:   Current Regimen: DRV/r + ABC + 3TC  Labs: HIV 1 RNA QUANT (copies/mL)  Date Value  02/21/2015 1183*  03/28/2014 80*  12/07/2013 61*   CD4 T CELL ABS (/uL)  Date Value  02/21/2015 570  03/28/2014 250*  12/07/2013 390*   HEP B S AB (no units)  Date Value  03/24/2014 POSITIVE*   HEPATITIS B SURFACE AG (no units)  Date Value  03/24/2014 NEGATIVE   HCV AB (no units)  Date Value  05/06/2013 NEGATIVE    CrCl: Estimated Creatinine Clearance: 7.8 mL/min (by C-G formula based on Cr of 11.7).  Lipids:    Component Value Date/Time   CHOL 113 02/21/2015 0911   TRIG 145 02/21/2015 0911   HDL 30* 02/21/2015 0911   CHOLHDL 3.8 02/21/2015 0911   VLDL 29 02/21/2015  0911   LDLCALC 54 02/21/2015 0911    Assessment: 38 who hasn't been here since last year. He was previously on 3TC/ABC/DRVr before he lost his insurance then there was a gap in his therapy. He got insurance back and restarted his therapy. The concerning this is the VL>1000. He said his compliance has been good. His genotype is still pending. We are going to try to keep everying once daily since he is on HD. Despite no genotype at this point, we think that AZT/Prez/DTG/3TC would be appropriate in this case.   Recommendations:  Stop ABC/DRV/r Start Prezcobix 1 PO qday Start DTG 50mg  PO qday Start AZT 300mg  PO qday Cont 3TC 50mg  PO qday Gave him the new calendar  Dylan Barber, PharmD [facility] 03/06/2015, 3:36 PM

## 2015-03-07 LAB — HIV-1 GENOTYPR PLUS

## 2015-03-08 DIAGNOSIS — D631 Anemia in chronic kidney disease: Secondary | ICD-10-CM | POA: Diagnosis not present

## 2015-03-08 DIAGNOSIS — N186 End stage renal disease: Secondary | ICD-10-CM | POA: Diagnosis not present

## 2015-03-10 DIAGNOSIS — N186 End stage renal disease: Secondary | ICD-10-CM | POA: Diagnosis not present

## 2015-03-10 DIAGNOSIS — D631 Anemia in chronic kidney disease: Secondary | ICD-10-CM | POA: Diagnosis not present

## 2015-03-11 DIAGNOSIS — B20 Human immunodeficiency virus [HIV] disease: Secondary | ICD-10-CM | POA: Diagnosis not present

## 2015-03-11 DIAGNOSIS — N186 End stage renal disease: Secondary | ICD-10-CM | POA: Diagnosis not present

## 2015-03-11 DIAGNOSIS — Z992 Dependence on renal dialysis: Secondary | ICD-10-CM | POA: Diagnosis not present

## 2015-03-13 DIAGNOSIS — N2581 Secondary hyperparathyroidism of renal origin: Secondary | ICD-10-CM | POA: Diagnosis not present

## 2015-03-13 DIAGNOSIS — N186 End stage renal disease: Secondary | ICD-10-CM | POA: Diagnosis not present

## 2015-03-13 DIAGNOSIS — D631 Anemia in chronic kidney disease: Secondary | ICD-10-CM | POA: Diagnosis not present

## 2015-03-15 DIAGNOSIS — D631 Anemia in chronic kidney disease: Secondary | ICD-10-CM | POA: Diagnosis not present

## 2015-03-15 DIAGNOSIS — N2581 Secondary hyperparathyroidism of renal origin: Secondary | ICD-10-CM | POA: Diagnosis not present

## 2015-03-15 DIAGNOSIS — N186 End stage renal disease: Secondary | ICD-10-CM | POA: Diagnosis not present

## 2015-03-17 DIAGNOSIS — N2581 Secondary hyperparathyroidism of renal origin: Secondary | ICD-10-CM | POA: Diagnosis not present

## 2015-03-17 DIAGNOSIS — N186 End stage renal disease: Secondary | ICD-10-CM | POA: Diagnosis not present

## 2015-03-17 DIAGNOSIS — D631 Anemia in chronic kidney disease: Secondary | ICD-10-CM | POA: Diagnosis not present

## 2015-03-20 DIAGNOSIS — D631 Anemia in chronic kidney disease: Secondary | ICD-10-CM | POA: Diagnosis not present

## 2015-03-20 DIAGNOSIS — N186 End stage renal disease: Secondary | ICD-10-CM | POA: Diagnosis not present

## 2015-03-20 DIAGNOSIS — N2581 Secondary hyperparathyroidism of renal origin: Secondary | ICD-10-CM | POA: Diagnosis not present

## 2015-03-21 DIAGNOSIS — Z01810 Encounter for preprocedural cardiovascular examination: Secondary | ICD-10-CM | POA: Diagnosis not present

## 2015-03-22 DIAGNOSIS — D631 Anemia in chronic kidney disease: Secondary | ICD-10-CM | POA: Diagnosis not present

## 2015-03-22 DIAGNOSIS — N2581 Secondary hyperparathyroidism of renal origin: Secondary | ICD-10-CM | POA: Diagnosis not present

## 2015-03-22 DIAGNOSIS — N186 End stage renal disease: Secondary | ICD-10-CM | POA: Diagnosis not present

## 2015-03-27 DIAGNOSIS — N2581 Secondary hyperparathyroidism of renal origin: Secondary | ICD-10-CM | POA: Diagnosis not present

## 2015-03-27 DIAGNOSIS — D631 Anemia in chronic kidney disease: Secondary | ICD-10-CM | POA: Diagnosis not present

## 2015-03-27 DIAGNOSIS — N186 End stage renal disease: Secondary | ICD-10-CM | POA: Diagnosis not present

## 2015-03-29 DIAGNOSIS — N2581 Secondary hyperparathyroidism of renal origin: Secondary | ICD-10-CM | POA: Diagnosis not present

## 2015-03-29 DIAGNOSIS — N186 End stage renal disease: Secondary | ICD-10-CM | POA: Diagnosis not present

## 2015-03-29 DIAGNOSIS — D631 Anemia in chronic kidney disease: Secondary | ICD-10-CM | POA: Diagnosis not present

## 2015-03-31 DIAGNOSIS — D631 Anemia in chronic kidney disease: Secondary | ICD-10-CM | POA: Diagnosis not present

## 2015-03-31 DIAGNOSIS — N186 End stage renal disease: Secondary | ICD-10-CM | POA: Diagnosis not present

## 2015-03-31 DIAGNOSIS — N2581 Secondary hyperparathyroidism of renal origin: Secondary | ICD-10-CM | POA: Diagnosis not present

## 2015-04-03 DIAGNOSIS — D631 Anemia in chronic kidney disease: Secondary | ICD-10-CM | POA: Diagnosis not present

## 2015-04-03 DIAGNOSIS — N2581 Secondary hyperparathyroidism of renal origin: Secondary | ICD-10-CM | POA: Diagnosis not present

## 2015-04-03 DIAGNOSIS — N186 End stage renal disease: Secondary | ICD-10-CM | POA: Diagnosis not present

## 2015-04-05 DIAGNOSIS — D631 Anemia in chronic kidney disease: Secondary | ICD-10-CM | POA: Diagnosis not present

## 2015-04-05 DIAGNOSIS — N2581 Secondary hyperparathyroidism of renal origin: Secondary | ICD-10-CM | POA: Diagnosis not present

## 2015-04-05 DIAGNOSIS — N186 End stage renal disease: Secondary | ICD-10-CM | POA: Diagnosis not present

## 2015-04-06 ENCOUNTER — Ambulatory Visit (INDEPENDENT_AMBULATORY_CARE_PROVIDER_SITE_OTHER): Payer: Medicare Other | Admitting: Internal Medicine

## 2015-04-06 ENCOUNTER — Ambulatory Visit: Payer: Medicare Other | Admitting: Internal Medicine

## 2015-04-06 ENCOUNTER — Encounter: Payer: Self-pay | Admitting: Internal Medicine

## 2015-04-06 VITALS — Temp 98.6°F | Ht 66.5 in | Wt 173.0 lb

## 2015-04-06 DIAGNOSIS — B20 Human immunodeficiency virus [HIV] disease: Secondary | ICD-10-CM

## 2015-04-06 DIAGNOSIS — Z113 Encounter for screening for infections with a predominantly sexual mode of transmission: Secondary | ICD-10-CM | POA: Diagnosis not present

## 2015-04-06 NOTE — Assessment & Plan Note (Addendum)
Seems to be doing well now.  Will check labs today and hopefully undetectable.  If ok, rtc 3 months.  25 minutes spent with 15 min face to face counseling on adherence.

## 2015-04-06 NOTE — Progress Notes (Signed)
   Subjective:    Patient ID: Dylan Barber., male    DOB: Apr 01, 1976, 39 y.o.   MRN: [id number]  HPI Here for follow up for HIV after a long abscence.  I last saw him one year ago on a renally adjusted regimen and he was doing well.  He though left the country and ran out of his medications.  He had been in the hospital due to missing dialysis but then saw my partner last month and got started on a new regimen with anticipated resistance.  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  He has been taking this well and pleased with the regimen.  He understands the importance of taking it daily and follow up.     Review of Systems  Constitutional: Negative for fatigue and unexpected weight change.  HENT: Negative for trouble swallowing.   Gastrointestinal: Negative for nausea and diarrhea.  Skin: Negative for rash.  Neurological: Negative for dizziness, light-headedness and headaches.       Objective:   Physical Exam  Constitutional: He appears well-developed and well-nourished. No distress.  HENT:  Mouth/Throat: No oropharyngeal exudate.  Eyes: No scleral icterus.  Cardiovascular: Normal rate, regular rhythm and normal heart sounds.   No murmur heard. Pulmonary/Chest: Effort normal and breath sounds normal. No respiratory distress.  Lymphadenopathy:    He has no cervical adenopathy.  Skin: No rash noted.          Assessment & Plan:

## 2015-04-07 DIAGNOSIS — N186 End stage renal disease: Secondary | ICD-10-CM | POA: Diagnosis not present

## 2015-04-07 DIAGNOSIS — D631 Anemia in chronic kidney disease: Secondary | ICD-10-CM | POA: Diagnosis not present

## 2015-04-07 DIAGNOSIS — N2581 Secondary hyperparathyroidism of renal origin: Secondary | ICD-10-CM | POA: Diagnosis not present

## 2015-04-07 LAB — T-HELPER CELL (CD4) - (RCID CLINIC ONLY)
CD4 % Helper T Cell: 14 % — ABNORMAL LOW (ref 33–55)
CD4 T CELL ABS: 440 /uL (ref 400–2700)

## 2015-04-09 LAB — HIV-1 RNA QUANT-NO REFLEX-BLD
HIV 1 RNA QUANT: 21 {copies}/mL (ref <20)
HIV-1 RNA QUANT, LOG: 1.32 {Log} — AB (ref <1.30)

## 2015-04-10 DIAGNOSIS — D631 Anemia in chronic kidney disease: Secondary | ICD-10-CM | POA: Diagnosis not present

## 2015-04-10 DIAGNOSIS — N2581 Secondary hyperparathyroidism of renal origin: Secondary | ICD-10-CM | POA: Diagnosis not present

## 2015-04-10 DIAGNOSIS — N186 End stage renal disease: Secondary | ICD-10-CM | POA: Diagnosis not present

## 2015-04-11 ENCOUNTER — Telehealth: Payer: Self-pay | Admitting: Licensed Clinical Social Worker

## 2015-04-11 DIAGNOSIS — B20 Human immunodeficiency virus [HIV] disease: Secondary | ICD-10-CM | POA: Diagnosis not present

## 2015-04-11 DIAGNOSIS — N186 End stage renal disease: Secondary | ICD-10-CM | POA: Diagnosis not present

## 2015-04-11 DIAGNOSIS — Z992 Dependence on renal dialysis: Secondary | ICD-10-CM | POA: Diagnosis not present

## 2015-04-11 NOTE — Telephone Encounter (Signed)
-----   Message from Thayer Headings, MD sent at 04/09/2015  2:21 PM EDT ----- Please let him know his HIV viral load looks great and he can return in 3 months.

## 2015-04-11 NOTE — Telephone Encounter (Signed)
I notified the patient.

## 2015-04-12 DIAGNOSIS — N186 End stage renal disease: Secondary | ICD-10-CM | POA: Diagnosis not present

## 2015-04-12 DIAGNOSIS — D631 Anemia in chronic kidney disease: Secondary | ICD-10-CM | POA: Diagnosis not present

## 2015-04-14 DIAGNOSIS — D631 Anemia in chronic kidney disease: Secondary | ICD-10-CM | POA: Diagnosis not present

## 2015-04-14 DIAGNOSIS — N186 End stage renal disease: Secondary | ICD-10-CM | POA: Diagnosis not present

## 2015-04-17 DIAGNOSIS — N186 End stage renal disease: Secondary | ICD-10-CM | POA: Diagnosis not present

## 2015-04-17 DIAGNOSIS — D631 Anemia in chronic kidney disease: Secondary | ICD-10-CM | POA: Diagnosis not present

## 2015-04-18 ENCOUNTER — Ambulatory Visit: Payer: Medicare Other | Admitting: Interventional Cardiology

## 2015-04-19 DIAGNOSIS — D631 Anemia in chronic kidney disease: Secondary | ICD-10-CM | POA: Diagnosis not present

## 2015-04-19 DIAGNOSIS — N186 End stage renal disease: Secondary | ICD-10-CM | POA: Diagnosis not present

## 2015-04-21 DIAGNOSIS — N186 End stage renal disease: Secondary | ICD-10-CM | POA: Diagnosis not present

## 2015-04-21 DIAGNOSIS — D631 Anemia in chronic kidney disease: Secondary | ICD-10-CM | POA: Diagnosis not present

## 2015-04-24 DIAGNOSIS — N186 End stage renal disease: Secondary | ICD-10-CM | POA: Diagnosis not present

## 2015-04-24 DIAGNOSIS — D631 Anemia in chronic kidney disease: Secondary | ICD-10-CM | POA: Diagnosis not present

## 2015-04-26 DIAGNOSIS — N186 End stage renal disease: Secondary | ICD-10-CM | POA: Diagnosis not present

## 2015-04-26 DIAGNOSIS — D631 Anemia in chronic kidney disease: Secondary | ICD-10-CM | POA: Diagnosis not present

## 2015-04-28 DIAGNOSIS — D631 Anemia in chronic kidney disease: Secondary | ICD-10-CM | POA: Diagnosis not present

## 2015-04-28 DIAGNOSIS — N186 End stage renal disease: Secondary | ICD-10-CM | POA: Diagnosis not present

## 2015-05-01 DIAGNOSIS — D631 Anemia in chronic kidney disease: Secondary | ICD-10-CM | POA: Diagnosis not present

## 2015-05-01 DIAGNOSIS — N186 End stage renal disease: Secondary | ICD-10-CM | POA: Diagnosis not present

## 2015-05-03 DIAGNOSIS — N186 End stage renal disease: Secondary | ICD-10-CM | POA: Diagnosis not present

## 2015-05-03 DIAGNOSIS — D631 Anemia in chronic kidney disease: Secondary | ICD-10-CM | POA: Diagnosis not present

## 2015-05-04 DIAGNOSIS — D631 Anemia in chronic kidney disease: Secondary | ICD-10-CM | POA: Diagnosis not present

## 2015-05-04 DIAGNOSIS — N186 End stage renal disease: Secondary | ICD-10-CM | POA: Diagnosis not present

## 2015-05-08 DIAGNOSIS — N186 End stage renal disease: Secondary | ICD-10-CM | POA: Diagnosis not present

## 2015-05-08 DIAGNOSIS — D631 Anemia in chronic kidney disease: Secondary | ICD-10-CM | POA: Diagnosis not present

## 2015-05-10 DIAGNOSIS — N186 End stage renal disease: Secondary | ICD-10-CM | POA: Diagnosis not present

## 2015-05-10 DIAGNOSIS — D631 Anemia in chronic kidney disease: Secondary | ICD-10-CM | POA: Diagnosis not present

## 2015-05-11 ENCOUNTER — Encounter: Payer: Self-pay | Admitting: Interventional Cardiology

## 2015-05-11 DIAGNOSIS — B20 Human immunodeficiency virus [HIV] disease: Secondary | ICD-10-CM | POA: Diagnosis not present

## 2015-05-11 DIAGNOSIS — Z992 Dependence on renal dialysis: Secondary | ICD-10-CM | POA: Diagnosis not present

## 2015-05-11 DIAGNOSIS — N186 End stage renal disease: Secondary | ICD-10-CM | POA: Diagnosis not present

## 2015-05-13 DIAGNOSIS — N2581 Secondary hyperparathyroidism of renal origin: Secondary | ICD-10-CM | POA: Diagnosis not present

## 2015-05-13 DIAGNOSIS — N186 End stage renal disease: Secondary | ICD-10-CM | POA: Diagnosis not present

## 2015-05-13 DIAGNOSIS — D631 Anemia in chronic kidney disease: Secondary | ICD-10-CM | POA: Diagnosis not present

## 2015-05-17 DIAGNOSIS — D631 Anemia in chronic kidney disease: Secondary | ICD-10-CM | POA: Diagnosis not present

## 2015-05-17 DIAGNOSIS — N186 End stage renal disease: Secondary | ICD-10-CM | POA: Diagnosis not present

## 2015-05-19 DIAGNOSIS — D631 Anemia in chronic kidney disease: Secondary | ICD-10-CM | POA: Diagnosis not present

## 2015-05-19 DIAGNOSIS — N186 End stage renal disease: Secondary | ICD-10-CM | POA: Diagnosis not present

## 2015-05-22 DIAGNOSIS — N186 End stage renal disease: Secondary | ICD-10-CM | POA: Diagnosis not present

## 2015-05-22 DIAGNOSIS — D631 Anemia in chronic kidney disease: Secondary | ICD-10-CM | POA: Diagnosis not present

## 2015-05-24 DIAGNOSIS — D631 Anemia in chronic kidney disease: Secondary | ICD-10-CM | POA: Diagnosis not present

## 2015-05-24 DIAGNOSIS — N186 End stage renal disease: Secondary | ICD-10-CM | POA: Diagnosis not present

## 2015-05-26 DIAGNOSIS — N186 End stage renal disease: Secondary | ICD-10-CM | POA: Diagnosis not present

## 2015-05-26 DIAGNOSIS — D631 Anemia in chronic kidney disease: Secondary | ICD-10-CM | POA: Diagnosis not present

## 2015-05-29 DIAGNOSIS — D631 Anemia in chronic kidney disease: Secondary | ICD-10-CM | POA: Diagnosis not present

## 2015-05-29 DIAGNOSIS — N186 End stage renal disease: Secondary | ICD-10-CM | POA: Diagnosis not present

## 2015-05-31 DIAGNOSIS — N186 End stage renal disease: Secondary | ICD-10-CM | POA: Diagnosis not present

## 2015-05-31 DIAGNOSIS — D631 Anemia in chronic kidney disease: Secondary | ICD-10-CM | POA: Diagnosis not present

## 2015-06-02 DIAGNOSIS — D631 Anemia in chronic kidney disease: Secondary | ICD-10-CM | POA: Diagnosis not present

## 2015-06-02 DIAGNOSIS — N186 End stage renal disease: Secondary | ICD-10-CM | POA: Diagnosis not present

## 2015-06-05 DIAGNOSIS — N186 End stage renal disease: Secondary | ICD-10-CM | POA: Diagnosis not present

## 2015-06-05 DIAGNOSIS — D631 Anemia in chronic kidney disease: Secondary | ICD-10-CM | POA: Diagnosis not present

## 2015-06-07 DIAGNOSIS — D631 Anemia in chronic kidney disease: Secondary | ICD-10-CM | POA: Diagnosis not present

## 2015-06-07 DIAGNOSIS — N186 End stage renal disease: Secondary | ICD-10-CM | POA: Diagnosis not present

## 2015-06-09 DIAGNOSIS — D631 Anemia in chronic kidney disease: Secondary | ICD-10-CM | POA: Diagnosis not present

## 2015-06-09 DIAGNOSIS — N186 End stage renal disease: Secondary | ICD-10-CM | POA: Diagnosis not present

## 2015-06-11 DIAGNOSIS — B20 Human immunodeficiency virus [HIV] disease: Secondary | ICD-10-CM | POA: Diagnosis not present

## 2015-06-11 DIAGNOSIS — Z992 Dependence on renal dialysis: Secondary | ICD-10-CM | POA: Diagnosis not present

## 2015-06-11 DIAGNOSIS — N186 End stage renal disease: Secondary | ICD-10-CM | POA: Diagnosis not present

## 2015-06-12 DIAGNOSIS — N186 End stage renal disease: Secondary | ICD-10-CM | POA: Diagnosis not present

## 2015-06-12 DIAGNOSIS — N2581 Secondary hyperparathyroidism of renal origin: Secondary | ICD-10-CM | POA: Diagnosis not present

## 2015-06-14 DIAGNOSIS — N2581 Secondary hyperparathyroidism of renal origin: Secondary | ICD-10-CM | POA: Diagnosis not present

## 2015-06-14 DIAGNOSIS — N186 End stage renal disease: Secondary | ICD-10-CM | POA: Diagnosis not present

## 2015-06-16 DIAGNOSIS — N2581 Secondary hyperparathyroidism of renal origin: Secondary | ICD-10-CM | POA: Diagnosis not present

## 2015-06-16 DIAGNOSIS — N186 End stage renal disease: Secondary | ICD-10-CM | POA: Diagnosis not present

## 2015-06-19 DIAGNOSIS — N2581 Secondary hyperparathyroidism of renal origin: Secondary | ICD-10-CM | POA: Diagnosis not present

## 2015-06-19 DIAGNOSIS — N186 End stage renal disease: Secondary | ICD-10-CM | POA: Diagnosis not present

## 2015-06-21 DIAGNOSIS — N2581 Secondary hyperparathyroidism of renal origin: Secondary | ICD-10-CM | POA: Diagnosis not present

## 2015-06-21 DIAGNOSIS — N186 End stage renal disease: Secondary | ICD-10-CM | POA: Diagnosis not present

## 2015-06-23 DIAGNOSIS — N2581 Secondary hyperparathyroidism of renal origin: Secondary | ICD-10-CM | POA: Diagnosis not present

## 2015-06-23 DIAGNOSIS — N186 End stage renal disease: Secondary | ICD-10-CM | POA: Diagnosis not present

## 2015-06-26 DIAGNOSIS — N2581 Secondary hyperparathyroidism of renal origin: Secondary | ICD-10-CM | POA: Diagnosis not present

## 2015-06-26 DIAGNOSIS — N186 End stage renal disease: Secondary | ICD-10-CM | POA: Diagnosis not present

## 2015-06-27 ENCOUNTER — Other Ambulatory Visit: Payer: Medicare Other

## 2015-06-28 DIAGNOSIS — N2581 Secondary hyperparathyroidism of renal origin: Secondary | ICD-10-CM | POA: Diagnosis not present

## 2015-06-28 DIAGNOSIS — N186 End stage renal disease: Secondary | ICD-10-CM | POA: Diagnosis not present

## 2015-06-29 DIAGNOSIS — E8779 Other fluid overload: Secondary | ICD-10-CM | POA: Diagnosis not present

## 2015-06-29 DIAGNOSIS — N186 End stage renal disease: Secondary | ICD-10-CM | POA: Diagnosis not present

## 2015-06-30 DIAGNOSIS — N2581 Secondary hyperparathyroidism of renal origin: Secondary | ICD-10-CM | POA: Diagnosis not present

## 2015-06-30 DIAGNOSIS — N186 End stage renal disease: Secondary | ICD-10-CM | POA: Diagnosis not present

## 2015-07-03 DIAGNOSIS — N186 End stage renal disease: Secondary | ICD-10-CM | POA: Diagnosis not present

## 2015-07-03 DIAGNOSIS — N2581 Secondary hyperparathyroidism of renal origin: Secondary | ICD-10-CM | POA: Diagnosis not present

## 2015-07-05 DIAGNOSIS — N186 End stage renal disease: Secondary | ICD-10-CM | POA: Diagnosis not present

## 2015-07-05 DIAGNOSIS — N2581 Secondary hyperparathyroidism of renal origin: Secondary | ICD-10-CM | POA: Diagnosis not present

## 2015-07-07 DIAGNOSIS — N2581 Secondary hyperparathyroidism of renal origin: Secondary | ICD-10-CM | POA: Diagnosis not present

## 2015-07-07 DIAGNOSIS — N186 End stage renal disease: Secondary | ICD-10-CM | POA: Diagnosis not present

## 2015-07-10 DIAGNOSIS — N2581 Secondary hyperparathyroidism of renal origin: Secondary | ICD-10-CM | POA: Diagnosis not present

## 2015-07-10 DIAGNOSIS — N186 End stage renal disease: Secondary | ICD-10-CM | POA: Diagnosis not present

## 2015-07-11 ENCOUNTER — Ambulatory Visit (INDEPENDENT_AMBULATORY_CARE_PROVIDER_SITE_OTHER): Payer: Medicare Other | Admitting: Internal Medicine

## 2015-07-11 ENCOUNTER — Encounter: Payer: Self-pay | Admitting: Internal Medicine

## 2015-07-11 VITALS — BP 128/78 | HR 91 | Temp 98.1°F | Wt 183.0 lb

## 2015-07-11 DIAGNOSIS — B20 Human immunodeficiency virus [HIV] disease: Secondary | ICD-10-CM | POA: Diagnosis not present

## 2015-07-11 DIAGNOSIS — I10 Essential (primary) hypertension: Secondary | ICD-10-CM

## 2015-07-11 DIAGNOSIS — Z23 Encounter for immunization: Secondary | ICD-10-CM | POA: Diagnosis not present

## 2015-07-11 NOTE — Assessment & Plan Note (Signed)
Stable, no issues 

## 2015-07-11 NOTE — Assessment & Plan Note (Signed)
Doing well  I will get his labs today and rtc 4 months unless concerns.

## 2015-07-11 NOTE — Progress Notes (Signed)
   Subjective:    Patient ID: Dylan Barber., male    DOB: 1976/05/15, 39 y.o.   MRN: [id number]  HPI Here for follow up for HIV.  This is his second visit after a long abscence. 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  He has been taking this well and pleased with the regimen.  He understands the importance of taking it daily and follow up.  Missed his lab appt prior to this appt.  Denies any missed doses.   Being evaluated for transplant at [facility].     Review of Systems  Constitutional: Negative for fatigue and unexpected weight change.  HENT: Negative for trouble swallowing.   Gastrointestinal: Negative for nausea and diarrhea.  Skin: Negative for rash.  Neurological: Negative for dizziness, light-headedness and headaches.       Objective:   Physical Exam  Constitutional: He appears well-developed and well-nourished. No distress.  HENT:  Mouth/Throat: No oropharyngeal exudate.  Eyes: No scleral icterus.  Cardiovascular: Normal rate, regular rhythm and normal heart sounds.   No murmur heard. Pulmonary/Chest: Effort normal and breath sounds normal. No respiratory distress.  Lymphadenopathy:    He has no cervical adenopathy.  Skin: No rash noted.          Assessment & Plan:

## 2015-07-12 DIAGNOSIS — Z992 Dependence on renal dialysis: Secondary | ICD-10-CM | POA: Diagnosis not present

## 2015-07-12 DIAGNOSIS — N2581 Secondary hyperparathyroidism of renal origin: Secondary | ICD-10-CM | POA: Diagnosis not present

## 2015-07-12 DIAGNOSIS — B20 Human immunodeficiency virus [HIV] disease: Secondary | ICD-10-CM | POA: Diagnosis not present

## 2015-07-12 DIAGNOSIS — N186 End stage renal disease: Secondary | ICD-10-CM | POA: Diagnosis not present

## 2015-07-12 LAB — HIV-1 RNA QUANT-NO REFLEX-BLD
HIV 1 RNA Quant: 238 copies/mL — ABNORMAL HIGH (ref <20)
HIV-1 RNA Quant, Log: 2.38 {Log} — ABNORMAL HIGH (ref <1.30)

## 2015-07-12 LAB — T-HELPER CELL (CD4) - (RCID CLINIC ONLY)
CD4 T CELL HELPER: 19 % — AB (ref 33–55)
CD4 T Cell Abs: 590 /uL (ref 400–2700)

## 2015-07-13 DIAGNOSIS — N186 End stage renal disease: Secondary | ICD-10-CM | POA: Diagnosis not present

## 2015-07-13 DIAGNOSIS — E8779 Other fluid overload: Secondary | ICD-10-CM | POA: Diagnosis not present

## 2015-07-14 DIAGNOSIS — N2581 Secondary hyperparathyroidism of renal origin: Secondary | ICD-10-CM | POA: Diagnosis not present

## 2015-07-14 DIAGNOSIS — D631 Anemia in chronic kidney disease: Secondary | ICD-10-CM | POA: Diagnosis not present

## 2015-07-14 DIAGNOSIS — N186 End stage renal disease: Secondary | ICD-10-CM | POA: Diagnosis not present

## 2015-07-17 DIAGNOSIS — D631 Anemia in chronic kidney disease: Secondary | ICD-10-CM | POA: Diagnosis not present

## 2015-07-17 DIAGNOSIS — N186 End stage renal disease: Secondary | ICD-10-CM | POA: Diagnosis not present

## 2015-07-17 DIAGNOSIS — N2581 Secondary hyperparathyroidism of renal origin: Secondary | ICD-10-CM | POA: Diagnosis not present

## 2015-07-19 DIAGNOSIS — N2581 Secondary hyperparathyroidism of renal origin: Secondary | ICD-10-CM | POA: Diagnosis not present

## 2015-07-19 DIAGNOSIS — N186 End stage renal disease: Secondary | ICD-10-CM | POA: Diagnosis not present

## 2015-07-19 DIAGNOSIS — B20 Human immunodeficiency virus [HIV] disease: Secondary | ICD-10-CM | POA: Diagnosis not present

## 2015-07-19 DIAGNOSIS — D631 Anemia in chronic kidney disease: Secondary | ICD-10-CM | POA: Diagnosis not present

## 2015-07-20 DIAGNOSIS — N2581 Secondary hyperparathyroidism of renal origin: Secondary | ICD-10-CM | POA: Diagnosis not present

## 2015-07-20 DIAGNOSIS — D631 Anemia in chronic kidney disease: Secondary | ICD-10-CM | POA: Diagnosis not present

## 2015-07-20 DIAGNOSIS — N186 End stage renal disease: Secondary | ICD-10-CM | POA: Diagnosis not present

## 2015-07-24 DIAGNOSIS — D631 Anemia in chronic kidney disease: Secondary | ICD-10-CM | POA: Diagnosis not present

## 2015-07-24 DIAGNOSIS — N2581 Secondary hyperparathyroidism of renal origin: Secondary | ICD-10-CM | POA: Diagnosis not present

## 2015-07-24 DIAGNOSIS — N186 End stage renal disease: Secondary | ICD-10-CM | POA: Diagnosis not present

## 2015-07-26 DIAGNOSIS — D631 Anemia in chronic kidney disease: Secondary | ICD-10-CM | POA: Diagnosis not present

## 2015-07-26 DIAGNOSIS — N186 End stage renal disease: Secondary | ICD-10-CM | POA: Diagnosis not present

## 2015-07-26 DIAGNOSIS — N2581 Secondary hyperparathyroidism of renal origin: Secondary | ICD-10-CM | POA: Diagnosis not present

## 2015-07-26 DIAGNOSIS — B20 Human immunodeficiency virus [HIV] disease: Secondary | ICD-10-CM | POA: Diagnosis not present

## 2015-07-28 DIAGNOSIS — D631 Anemia in chronic kidney disease: Secondary | ICD-10-CM | POA: Diagnosis not present

## 2015-07-28 DIAGNOSIS — N186 End stage renal disease: Secondary | ICD-10-CM | POA: Diagnosis not present

## 2015-07-28 DIAGNOSIS — N2581 Secondary hyperparathyroidism of renal origin: Secondary | ICD-10-CM | POA: Diagnosis not present

## 2015-07-31 DIAGNOSIS — N2581 Secondary hyperparathyroidism of renal origin: Secondary | ICD-10-CM | POA: Diagnosis not present

## 2015-07-31 DIAGNOSIS — N186 End stage renal disease: Secondary | ICD-10-CM | POA: Diagnosis not present

## 2015-07-31 DIAGNOSIS — D631 Anemia in chronic kidney disease: Secondary | ICD-10-CM | POA: Diagnosis not present

## 2015-08-02 DIAGNOSIS — N186 End stage renal disease: Secondary | ICD-10-CM | POA: Diagnosis not present

## 2015-08-02 DIAGNOSIS — D631 Anemia in chronic kidney disease: Secondary | ICD-10-CM | POA: Diagnosis not present

## 2015-08-02 DIAGNOSIS — N2581 Secondary hyperparathyroidism of renal origin: Secondary | ICD-10-CM | POA: Diagnosis not present

## 2015-08-04 DIAGNOSIS — N186 End stage renal disease: Secondary | ICD-10-CM | POA: Diagnosis not present

## 2015-08-04 DIAGNOSIS — N2581 Secondary hyperparathyroidism of renal origin: Secondary | ICD-10-CM | POA: Diagnosis not present

## 2015-08-04 DIAGNOSIS — D631 Anemia in chronic kidney disease: Secondary | ICD-10-CM | POA: Diagnosis not present

## 2015-08-07 DIAGNOSIS — N2581 Secondary hyperparathyroidism of renal origin: Secondary | ICD-10-CM | POA: Diagnosis not present

## 2015-08-07 DIAGNOSIS — N186 End stage renal disease: Secondary | ICD-10-CM | POA: Diagnosis not present

## 2015-08-07 DIAGNOSIS — D631 Anemia in chronic kidney disease: Secondary | ICD-10-CM | POA: Diagnosis not present

## 2015-08-09 ENCOUNTER — Encounter: Payer: Self-pay | Admitting: Ophthalmology

## 2015-08-09 ENCOUNTER — Inpatient Hospital Stay (HOSPITAL_COMMUNITY)
Admission: EM | Admit: 2015-08-09 | Discharge: 2015-08-09 | DRG: 974 | Disposition: A | Discharge status: Other Acute Inpatient Hospital | Payer: Medicare Other | Attending: Student in an Organized Health Care Education/Training Program | Admitting: Student in an Organized Health Care Education/Training Program

## 2015-08-09 ENCOUNTER — Encounter (HOSPITAL_COMMUNITY): Payer: Self-pay | Admitting: Emergency Medicine

## 2015-08-09 ENCOUNTER — Emergency Department (HOSPITAL_COMMUNITY): Payer: Medicare Other

## 2015-08-09 DIAGNOSIS — Z885 Allergy status to narcotic agent status: Secondary | ICD-10-CM | POA: Diagnosis not present

## 2015-08-09 DIAGNOSIS — B0230 Zoster ocular disease, unspecified: Secondary | ICD-10-CM | POA: Diagnosis present

## 2015-08-09 DIAGNOSIS — B029 Zoster without complications: Principal | ICD-10-CM | POA: Diagnosis present

## 2015-08-09 DIAGNOSIS — B20 Human immunodeficiency virus [HIV] disease: Secondary | ICD-10-CM | POA: Diagnosis present

## 2015-08-09 DIAGNOSIS — N186 End stage renal disease: Secondary | ICD-10-CM | POA: Diagnosis present

## 2015-08-09 DIAGNOSIS — H109 Unspecified conjunctivitis: Secondary | ICD-10-CM | POA: Diagnosis not present

## 2015-08-09 DIAGNOSIS — G40909 Epilepsy, unspecified, not intractable, without status epilepticus: Secondary | ICD-10-CM | POA: Diagnosis present

## 2015-08-09 DIAGNOSIS — R21 Rash and other nonspecific skin eruption: Secondary | ICD-10-CM | POA: Diagnosis not present

## 2015-08-09 DIAGNOSIS — Z992 Dependence on renal dialysis: Secondary | ICD-10-CM

## 2015-08-09 DIAGNOSIS — Z79899 Other long term (current) drug therapy: Secondary | ICD-10-CM | POA: Diagnosis not present

## 2015-08-09 DIAGNOSIS — B028 Zoster with other complications: Secondary | ICD-10-CM | POA: Diagnosis not present

## 2015-08-09 DIAGNOSIS — I120 Hypertensive chronic kidney disease with stage 5 chronic kidney disease or end stage renal disease: Secondary | ICD-10-CM | POA: Diagnosis present

## 2015-08-09 DIAGNOSIS — Z888 Allergy status to other drugs, medicaments and biological substances status: Secondary | ICD-10-CM | POA: Diagnosis not present

## 2015-08-09 DIAGNOSIS — H5711 Ocular pain, right eye: Secondary | ICD-10-CM | POA: Diagnosis not present

## 2015-08-09 LAB — CBC WITH DIFFERENTIAL/PLATELET
BASOS PCT: 0 %
Basophils Absolute: 0 10*3/uL (ref 0.0–0.1)
EOS ABS: 0.1 10*3/uL (ref 0.0–0.7)
EOS PCT: 2 %
HCT: 29.8 % — ABNORMAL LOW (ref 39.0–52.0)
Hemoglobin: 10.2 g/dL — ABNORMAL LOW (ref 13.0–17.0)
LYMPHS ABS: 1.6 10*3/uL (ref 0.7–4.0)
Lymphocytes Relative: 25 %
MCH: 38.5 pg — AB (ref 26.0–34.0)
MCHC: 34.2 g/dL (ref 30.0–36.0)
MCV: 112.5 fL — AB (ref 78.0–100.0)
MONO ABS: 0.8 10*3/uL (ref 0.1–1.0)
Monocytes Relative: 13 %
Neutro Abs: 3.8 10*3/uL (ref 1.7–7.7)
Neutrophils Relative %: 60 %
PLATELETS: 241 10*3/uL (ref 150–400)
RBC: 2.65 MIL/uL — ABNORMAL LOW (ref 4.22–5.81)
RDW: 14 % (ref 11.5–15.5)
WBC: 6.3 10*3/uL (ref 4.0–10.5)

## 2015-08-09 LAB — BASIC METABOLIC PANEL
ANION GAP: 18 — AB (ref 5–15)
BUN: 37 mg/dL — AB (ref 6–20)
CALCIUM: 8.7 mg/dL — AB (ref 8.9–10.3)
CO2: 24 mmol/L (ref 22–32)
Chloride: 94 mmol/L — ABNORMAL LOW (ref 101–111)
Creatinine, Ser: 14.5 mg/dL — ABNORMAL HIGH (ref 0.61–1.24)
GFR calc Af Amer: 4 mL/min — ABNORMAL LOW (ref >60)
GFR, EST NON AFRICAN AMERICAN: 4 mL/min — AB (ref >60)
GLUCOSE: 86 mg/dL (ref 65–99)
Potassium: 4.1 mmol/L (ref 3.5–5.1)
SODIUM: 136 mmol/L (ref 135–145)

## 2015-08-09 LAB — PHOSPHORUS: Phosphorus: 5.7 mg/dL — ABNORMAL HIGH (ref 2.5–4.6)

## 2015-08-09 LAB — MAGNESIUM: MAGNESIUM: 2 mg/dL (ref 1.7–2.4)

## 2015-08-09 MED ORDER — FENTANYL CITRATE (PF) 100 MCG/2ML IJ SOLN
25.0000 ug | INTRAMUSCULAR | Status: DC | PRN
Start: 2015-08-09 — End: 2015-08-09

## 2015-08-09 MED ORDER — VANCOMYCIN HCL IN DEXTROSE 1-5 GM/200ML-% IV SOLN
1000.0000 mg | Freq: Once | INTRAVENOUS | Status: AC
  Administered 2015-08-09: 1000 mg via INTRAVENOUS
  Filled 2015-08-09: qty 200

## 2015-08-09 MED ORDER — VALACYCLOVIR HCL 500 MG PO TABS
1000.0000 mg | ORAL_TABLET | Freq: Once | ORAL | Status: DC
  Filled 2015-08-09: qty 2

## 2015-08-09 MED ORDER — FENTANYL CITRATE (PF) 100 MCG/2ML IJ SOLN
25.0000 ug | Freq: Once | INTRAMUSCULAR | Status: AC
  Administered 2015-08-09: 25 ug via INTRAVENOUS
  Filled 2015-08-09: qty 2

## 2015-08-09 MED ORDER — SODIUM CHLORIDE 0.9 % IV BOLUS (SEPSIS)
500.0000 mL | Freq: Once | INTRAVENOUS | Status: AC
  Administered 2015-08-09: 500 mL via INTRAVENOUS

## 2015-08-09 MED ORDER — DEXTROSE 5 % IV SOLN
5.0000 mg/kg | INTRAVENOUS | Status: DC
  Administered 2015-08-09: 415 mg via INTRAVENOUS
  Filled 2015-08-09: qty 8.3

## 2015-08-09 NOTE — ED Notes (Signed)
Patient transported to the Eye room to be assessed by the Opthalmologist.

## 2015-08-09 NOTE — ED Notes (Signed)
[facility] at bedside to transport patient. All transport paperwork given to [facility]. Patient's belongings given to patient's family member at bedside.

## 2015-08-09 NOTE — Progress Notes (Unsigned)
Patient ID: Dylan Barber., male   DOB: Nov 20, 1975, 39 y.o.   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 He has a 3 day history of Herpes Zoster involving th Left 1st Division V with nasociliary branch involvement. He has related edema of the brow , upper lid and nose.  He has no visual complaints currently and does not have photophobia at this time. He is on a reduced dosage of Valtrex given his renal issues and is only able to take 1000mg .  Slit lamp not working in the ED.  Exam attempted at slit lamp with side lighting form the stand light.   VA: 20/20 at near OU   IOP deferred  EOMS: OD: Full OS:  full with lid elevated by a cotton tipped applicator  L/L: Pock lesions is left 1st Division V distribution with lesions on the nose bridge. Conj: injected without discharge Scl: intact Cor: no overt epithelial defect at this time, no stromal infiltrate at this time [facility]: without the proper slit lamp lighting I cannot rule out anterior chamber flare and cell IRIS: normal OU Lens: clear OU  Impression:   Left 1st Division V Zoster with nasociliary involvement, Eye at high risk for iridocyclitis and viral keratitis  Discussion:  I recommend transfer to [facility] as he needs collaboration between Nephrology, Infectious Disease and Ophthalmology regarding medication dosingand care  while having access to  Comprehensive Ophthalmology evaluation services, slit lamp exam, etc. To guide his medical management. In that he does not have corneal infiltrates yet, his ocular evaluation and management guidance by their Ophthalmology service is needed.  Once he is showing an adequate therapeutic response, he can resume his usual care here in [place].  Greer L. Anderson Malta, MD Ophthalmology

## 2015-08-09 NOTE — ED Notes (Signed)
Dr. Rees at bedside  

## 2015-08-09 NOTE — ED Notes (Signed)
Patient here with complaint of left face "shingles rash". States rash appeared Saturday evening but was on a "couple dots" similar to a pimple. Was seen by his nephrologist and told he has shingles on his face. Rash progressed to cover left forehead and around eye with some rash on his nose. This AM when patient woke to begin his day he noted that his eye had completely swollen shut. Currently denies pain and states the rash only hurt for a short period of time and it was a burning sensation. Currently not sensitive to touch either. Tachycardic during triage with HR @ 120. Temp 99.9. Denies other symptoms aside from rash.

## 2015-08-09 NOTE — ED Notes (Signed)
PA Lucio Edward advised 25 mcg fentanyl pain medication can be administered IV.

## 2015-08-09 NOTE — H&P (Signed)
Date: 08/09/2015               Patient Name:  Dylan Barber. MRN: [id number]  DOB: 1976-05-20 Age / Sex: 39 y.o., male   PCP: No Pcp Per Patient         Medical Service: Internal Medicine Teaching Service         Attending Physician: Dr. Axel Filler, MD    First Contact: Florentina Addison Pager: [phone]  Second Contact: Dr. Naaman Plummer Pager: [phone]       After Hours (After 5p/  First Contact Pager: [phone]  weekends / holidays): Second Contact Pager: [phone]   Chief Complaint: rash on face  History of Present Illness: [name]. 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  He reports this rash started Sunday with what he thought was a fluid filled pimple on his forehead which he popped. This next day in dialysis it the rash was getting worse and he brought it up to the PA who brought in Dr Arty Baumgartner who diagnosed him with shingles and he started Valtrex.  He reports that after dialysis he picked up the prescription and started it that day, he report some immediate improvement in some of the tingling pain after starting this medication and felt he was getting better.  However he woke up Wednesday morning with increased swelling to the point his eye was swollen shut.  He reports he has continued to have minimal pain but he does report some pain with eye movement.  He denies any fever, chills, N/V, blurry vision.  He does report mild headaches.  Meds: Current Facility-Administered Medications  Medication Dose Route Frequency Provider Last Rate Last Dose  . acyclovir (ZOVIRAX) 415 mg in dextrose 5 % 100 mL IVPB  5 mg/kg Intravenous Q24H Jake Church Masters, RPH 108.3 mL/hr at 08/09/15 0919 415 mg at 08/09/15 0919  . fentaNYL (SUBLIMAZE) injection 25 mcg  25 mcg Nasal Q30 min PRN Delsa Grana, PA-C       Current Outpatient Prescriptions  Medication Sig  Dispense Refill  . carvedilol (COREG) 6.25 MG tablet Take 1 tablet (6.25 mg total) by mouth 2 (two) times daily. (Patient taking differently: Take 6.25 mg by mouth daily as needed. If leave HD and heart rate is over 110) 60 tablet 3  . darunavir-cobicistat (PREZCOBIX) 800-150 MG per tablet Take 1 tablet by mouth daily. 30 tablet 11  . dolutegravir (TIVICAY) 50 MG tablet Take 1 tablet (50 mg total) by mouth daily. 30 tablet 11  . ibuprofen (ADVIL,MOTRIN) 200 MG tablet Take 800 mg by mouth every 6 (six) hours as needed for headache.    . lamivudine (EPIVIR HBV) 100 MG tablet Take 0.5 tablets (50 mg total) by mouth daily. 15 tablet 11  . valACYclovir (VALTREX) 500 MG tablet Take 500 mg by mouth daily. For shingles    . zidovudine (RETROVIR) 300 MG tablet Take 1 tablet (300 mg total) by mouth daily. 30 tablet 11    Allergies: Allergies as of 08/09/2015 - Review Complete 08/09/2015  Allergen Reaction Noted  . Codeine Itching 05/03/2013  . Eggs or egg-derived products  05/03/2013  . Heparin Itching 05/14/2014  . Mercury  05/03/2013   Past Medical History  Diagnosis Date  . Hypertension   . Dialysis patient   . Renal disorder   .  HIV disease   . Anemia   . Seizure   . Heart murmur   . Lymphocytosis 06/16/2014  . Noncompliance 03/06/2015   Past Surgical History  Procedure Laterality Date  . Av fistula placement Left 05/07/2013    Procedure: ARTERIOVENOUS (AV) FISTULA CREATION- LEFT;  Surgeon: Rosetta Posner, MD;  Location: [facility];  Service: Vascular;  Laterality: Left;  . Insertion of dialysis catheter      x 2  . Av fistula placement Left 08/12/2013    Procedure: ARTERIOVENOUS (AV) FISTULA CREATION BRACHIOCEPHALIC;  Surgeon: Angelia Mould, MD;  Location: [facility];  Service: Vascular;  Laterality: Left;  . Ligation of arteriovenous  fistula Left 08/12/2013    Procedure: LIGATION OF ARTERIOVENOUS  FISTULA RADIOCEPHALIC;  Surgeon: Angelia Mould, MD;  [facility];  Service:  Vascular;  Laterality: Left;   Family History  Problem Relation Age of Onset  . Kidney failure Mother   . Kidney disease Mother   . Kidney failure Maternal Uncle   . Kidney disease Paternal Uncle   . Kidney disease Maternal Grandfather    Social History   Social History  . Marital Status: Single    Spouse Name: N/A  . Number of Children: N/A  . Years of Education: N/A   Occupational History  . Not on file.   Social History Main Topics  . Smoking status: Never Smoker   . Smokeless tobacco: Never Used  . Alcohol Use: No  . Drug Use: No  . Sexual Activity:    Partners: Female    Patent examiner Protection: Condom     Comment: pt. given condoms   Other Topics Concern  . Not on file   Social History Narrative   Originally from [place],  States father worked at [employer].  Moved from [place], [place]. On 6/21 to live with father.     Review of Systems: Review of Systems  Constitutional: Negative for fever, chills, weight loss and malaise/fatigue.  HENT: Negative for ear discharge, ear pain, hearing loss and tinnitus.   Eyes: Positive for pain (some pain with eye movement). Negative for blurred vision, double vision, photophobia and discharge.  Respiratory: Negative for shortness of breath.   Cardiovascular: Negative for chest pain.  Gastrointestinal: Negative for abdominal pain.  Genitourinary: Negative for dysuria.  Musculoskeletal: Negative for back pain.  Skin: Positive for rash.  Neurological: Positive for tingling and headaches. Negative for dizziness, sensory change and focal weakness.  All other systems reviewed and are negative.    Physical Exam: Blood pressure 149/95, pulse 110, temperature 99.5 F (37.5 C), temperature source Oral, resp. rate 19, weight 83 kg (182 lb 15.7 oz), SpO2 100 %. Physical Exam  Constitutional: He is oriented to person, place, and time and well-developed, well-nourished, and in no distress.  HENT:  Right Ear: Tympanic membrane and  ear canal normal.  Mouth/Throat: No oropharyngeal exudate.  Fine vesciular rash on right side from bridge of nose to hair line, with mild erythema Impacted cerumen on left ear  Eyes: EOM are normal. Pupils are equal, round, and reactive to light. Right eye exhibits no discharge. Left eye exhibits no discharge.  Cardiovascular: Normal rate and regular rhythm.   Pulmonary/Chest: Effort normal and breath sounds normal.  Abdominal: Soft. Bowel sounds are normal.  Musculoskeletal: He exhibits no edema.  Neurological: He is alert and oriented to person, place, and time.  Skin:  See photo below, no appreciated rash on arms, legs, abdomen  Psychiatric: Affect  normal.  Nursing note and vitals reviewed.      Lab results: Basic Metabolic Panel:  Recent Labs  08/09/15 0639  NA 136  K 4.1  CL 94*  CO2 24  GLUCOSE 86  BUN 37*  CREATININE 14.50*  CALCIUM 8.7*  MG 2.0  PHOS 5.7*   CBC:  Recent Labs  08/09/15 0715  WBC 6.3  NEUTROABS 3.8  HGB 10.2*  HCT 29.8*  MCV 112.5*  PLT 241   Imaging results:  No results found.  Other results: EKG: none  Assessment & Plan by Problem:   Zoster ophthalmicus - Failed outpatient treatment with valacyclovir.  ID and opthalmology have been consulted in the ED. - He has been placed on IV Acyclovir by ID - IV Ancef for possible super imposed bacterial infection - Will place on contact and airborne precautions    ESRD (end stage renal disease) on dialysis - Consulted Nephrology for inpatient HD    Human immunodeficiency virus (HIV) disease - Had been off his medications earlier this year due to cost but was restarted in April, has been compliant since that time and last CD4 was 590, VL 238 on 07/11/15. - Continue ART    Dispo: Disposition is deferred at this time, awaiting improvement of current medical problems. Anticipated discharge in approximately 2 day(s).   The patient does not have a current PCP (No Pcp Per Patient) and does  not know need an [facility] hospital follow-up appointment after discharge.  The patient does not have transportation limitations that hinder transportation to clinic appointments.  Signed: Lucious Groves, DO IMTS PGY-3 Pager: [phone] 08/09/2015, 10:08 AM   ADDENDUM: [facility] does not feel they can manage patient here at [facility], per EDP they have requested transfer to [facility] and this was accepted.  EDP filled out EMTALA.

## 2015-08-09 NOTE — ED Notes (Signed)
CT dept contacted about missing results for maxillofacial CT

## 2015-08-09 NOTE — ED Notes (Signed)
Was started on Valtrex Nephrology MD and started taking it on Monday 5/26.

## 2015-08-09 NOTE — ED Notes (Signed)
Spoke with MD Ralene Bathe about patient needing to be placed on isolation precautions. At this time MD doesn't suspect infection is disseminated and patient should be placed on contact isolation. Advises that Airborne precautions are not necessary at this time.

## 2015-08-09 NOTE — ED Provider Notes (Signed)
CSN: [id number]     Arrival date & time 08/09/15  [id number] History   First MD Initiated Contact with Patient 08/09/15 [phone]     Chief Complaint  Patient presents with  . Rash     (Consider location/radiation/quality/duration/timing/severity/associated sxs/prior Treatment) HPI   The patient is a 39 year old male with history of ESRD with MWF dialysis, HIV, and hypertension, he presents to the emergency room this morning with progression of a rash all over the left side of his face, which was diagnosed as shingles 2 days ago. He was given Valtrex by his nephrologist to take 500 mg once a day.  He did have an ophthalmology appointment this morning, however when he woke up he had increased swelling to his left eye, he can barely open it, and he was concerned and came into the ER for evaluation. He states that the rash is painful, described as burning and stinging, however after beginning valtrex 2 days ago the pain has improved.  The rash began 4 days ago as a small vesicle in his forehead which he popped. The next morning there were several more on the left side of his forehead.  Now it is covering the entire left side of his face from his forehead and cheek to the midline bridge of his nose, it is red and swollen. He complains of a low-grade temp and a headache x 4 days that is located across his forehead and front of face, described as a "dense, pushing outward pressure."  He states that it is unchanged since its onset, not relieved with ibuprofen. He denies any neck stiffness, chills, sweats, nausea or vomiting.   Past Medical History  Diagnosis Date  . Hypertension   . Dialysis patient   . Renal disorder   . HIV disease   . Anemia   . Seizure   . Heart murmur   . Lymphocytosis 06/16/2014  . Noncompliance 03/06/2015   Past Surgical History  Procedure Laterality Date  . Av fistula placement Left 05/07/2013    Procedure: ARTERIOVENOUS (AV) FISTULA CREATION- LEFT;  Surgeon: Rosetta Posner, MD;   Location: [place];  Service: Vascular;  Laterality: Left;  . Insertion of dialysis catheter      x 2  . Av fistula placement Left 08/12/2013    Procedure: ARTERIOVENOUS (AV) FISTULA CREATION BRACHIOCEPHALIC;  Surgeon: Angelia Mould, MD;  Location: [place];  Service: Vascular;  Laterality: Left;  . Ligation of arteriovenous  fistula Left 08/12/2013    Procedure: LIGATION OF ARTERIOVENOUS  FISTULA RADIOCEPHALIC;  Surgeon: Angelia Mould, MD;  Location: [facility] OR;  Service: Vascular;  Laterality: Left;   Family History  Problem Relation Age of Onset  . Kidney failure Mother   . Kidney disease Mother   . Kidney failure Maternal Uncle   . Kidney disease Paternal Uncle   . Kidney disease Maternal Grandfather    Social History  Substance Use Topics  . Smoking status: Never Smoker   . Smokeless tobacco: Never Used  . Alcohol Use: No    Review of Systems  Constitutional: Positive for fever. Negative for chills, diaphoresis, activity change, appetite change and fatigue.  HENT: Positive for facial swelling and sinus pressure. Negative for congestion, hearing loss, mouth sores, nosebleeds, postnasal drip, rhinorrhea, sore throat, tinnitus, trouble swallowing and voice change.       Allergies  Codeine; Eggs or egg-derived products; Heparin; and Mercury  Home Medications   Prior to Admission medications   Medication Sig Start Date  End Date Taking? Authorizing Provider  carvedilol (COREG) 6.25 MG tablet Take 1 tablet (6.25 mg total) by mouth 2 (two) times daily. Patient taking differently: Take 6.25 mg by mouth daily as needed. If leave HD and heart rate is over 110 02/07/15  Yes Jettie Booze, MD  darunavir-cobicistat (PREZCOBIX) 800-150 MG per tablet Take 1 tablet by mouth daily. 03/06/15  Yes Truman Hayward, MD  dolutegravir (TIVICAY) 50 MG tablet Take 1 tablet (50 mg total) by mouth daily. 03/06/15  Yes Truman Hayward, MD  ibuprofen (ADVIL,MOTRIN) 200 MG tablet Take  800 mg by mouth every 6 (six) hours as needed for headache.   Yes Historical Provider, MD  lamivudine (EPIVIR HBV) 100 MG tablet Take 0.5 tablets (50 mg total) by mouth daily. 03/06/15  Yes Truman Hayward, MD  valACYclovir (VALTREX) 500 MG tablet Take 500 mg by mouth daily. For shingles   Yes Historical Provider, MD  zidovudine (RETROVIR) 300 MG tablet Take 1 tablet (300 mg total) by mouth daily. 03/06/15  Yes Truman Hayward, MD   BP 149/95 mmHg  Pulse 110  Temp(Src) 99.5 F (37.5 C) (Oral)  Resp 19  Wt 182 lb 15.7 oz (83 kg)  SpO2 100% Physical Exam  Constitutional: He is oriented to person, place, and time. He appears well-developed and well-nourished. No distress.  Chronically ill appearing man, appears older than stated age, NAD  HENT:  Head: Normocephalic and atraumatic. Head is with left periorbital erythema.    Nose: Nose normal.  Mouth/Throat: Oropharynx is clear and moist. No oropharyngeal exudate.  Eyes: Conjunctivae are normal. Pupils are equal, round, and reactive to light. Right eye exhibits no discharge. Left eye exhibits discharge. No scleral icterus. Left eye exhibits abnormal extraocular motion.    Neck: Normal range of motion. No JVD present. No tracheal deviation present. No thyromegaly present.  Cardiovascular: Normal rate, regular rhythm, normal heart sounds and intact distal pulses.  Exam reveals no gallop and no friction rub.   No murmur heard. Pulmonary/Chest: Effort normal and breath sounds normal. No respiratory distress. He has no wheezes. He has no rales. He exhibits no tenderness.  Abdominal: Soft. Bowel sounds are normal. He exhibits no distension and no mass. There is no tenderness. There is no rebound and no guarding.  Musculoskeletal: Normal range of motion. He exhibits no edema or tenderness.  Lymphadenopathy:    He has no cervical adenopathy.  Neurological: He is alert and oriented to person, place, and time. He has normal reflexes. No  cranial nerve deficit. He exhibits normal muscle tone. Coordination normal.  Skin: Skin is warm and dry. Rash noted. He is not diaphoretic. There is erythema. No pallor.  Psychiatric: He has a normal mood and affect. His behavior is normal. Judgment and thought content normal.  Nursing note and vitals reviewed.        ED Course  Procedures (including critical care time) Labs Review Labs Reviewed  BASIC METABOLIC PANEL - Abnormal; Notable for the following:    Chloride 94 (*)    BUN 37 (*)    Creatinine, Ser 14.50 (*)    Calcium 8.7 (*)    GFR calc non Af Amer 4 (*)    GFR calc Af Amer 4 (*)    Anion gap 18 (*)    All other components within normal limits  PHOSPHORUS - Abnormal; Notable for the following:    Phosphorus 5.7 (*)    All other components within  normal limits  CBC WITH DIFFERENTIAL/PLATELET - Abnormal; Notable for the following:    RBC 2.65 (*)    Hemoglobin 10.2 (*)    HCT 29.8 (*)    MCV 112.5 (*)    MCH 38.5 (*)    All other components within normal limits  MAGNESIUM  CBC WITH DIFFERENTIAL/PLATELET    Imaging Review No results found. I have personally reviewed and evaluated these images and lab results as part of my medical decision-making.   EKG Interpretation None      Consult Ophthalmology - Dr. Anderson Malta - Pt to be given 1000 mg of Valtrex, Dr. Anderson Malta to evaluate in the ED.  She spoke with my attending physician, Dr. Randal Buba - have discussed coordination of care between infectious disease, nephrology and ophthalmology.  Dr. Anderson Malta stated she will be in at 10 AM to evaluate the patient, charge nurse has been notified of need for the eye room in fast track, pending her arrival.    Consult to ID - for coordination of care, spoke with Dr. Tommy Medal - he was patient should be on 10 mg/kg of acyclovir, his colleague will see him this morning Family Medicine to admit - Dr. Joni Reining will come see in the ED.   MDM   Final diagnoses:  None   Pt with  HIV, ESRD/dialysis pt, with shingles x 4 days, sub therapeutic associated with headache  Pt is a high risk for dissemination.  He is tachycardic on presentation to the ER this morning, temperature is 99.9. He states that he feels well, but is complaining about a headache and concern for head the swelling in his left eye. Dr. Tommy Medal stated that the patient should be given 10 mg/kg of acyclovir IV, every 8 hours, due to his end-stage renal disease this necessitates pharmacy consult, those orders have been put in. He states with any other cellulitis, patient could be given Ancef.  The patient was given acyclovir per dosing with pharmacy, also given vanc and zosyn. Dr. Anderson Malta evaluated her in the ER and was unable to evaluate with slit lamp in the ER. She recommended transfer to [facility] for ophthalmology, ID and nephrology to manage the patient.  Dr. Ralene Bathe has seen and evaluated the patient and was assisting with coordination of care and transfer to [facility]. Please see her documentation for remainder of the patient's clinical course.     Delsa Grana, PA-C 08/11/15 [id number]  April Palumbo, MD 08/11/15 [phone]

## 2015-08-09 NOTE — ED Provider Notes (Signed)
Pt visit shared with Dr. Randal Buba and Delsa Grana, PA .  Pt with ESRD on HD as well as HIV here with facial swelling/rash c/w zoster.  Patient has been evaluated by Dr. Anderson Malta with Ophthalmology, who recommends transfer to [facility] for further mgmt given his complicated medical issues and her concern that she cannot manage him appropriately at [facility].  Patient also requests transfer to [facility].  D/w Ophtho at [facility], agrees to see patient in transfer - recommends ED to ED transfer.  D/w Dr. Noland Fordyce who agrees to see the patient in transfer.    Quintella Reichert, MD 08/09/15 [phone]

## 2015-08-09 NOTE — Progress Notes (Addendum)
ANTIBIOTIC CONSULT NOTE - INITIAL  Pharmacy Consult for acyclovir Indication: Shingles  Allergies  Allergen Reactions  . Codeine Itching  . Eggs Or Egg-Derived Products     Shortness of breath  . Heparin Itching    Severe itching  . Mercury     Childhood allergy    Patient Measurements: Weight: 182 lb 15.7 oz (83 kg) Adjusted Body Weight:   Vital Signs: Temp: 99.9 F (37.7 C) (09/28 0545) Temp Source: Oral (09/28 0545) BP: 117/73 mmHg (09/28 0745) Pulse Rate: 115 (09/28 0745) Intake/Output from previous day:   Intake/Output from this shift:    Labs:  Recent Labs  08/09/15 0639 08/09/15 0715  WBC  --  6.3  HGB  --  10.2*  PLT  --  241  CREATININE 14.50*  --    Estimated Creatinine Clearance: 6.8 mL/min (by C-G formula based on Cr of 14.5). No results for input(s): VANCOTROUGH, VANCOPEAK, VANCORANDOM, GENTTROUGH, GENTPEAK, GENTRANDOM, TOBRATROUGH, TOBRAPEAK, TOBRARND, AMIKACINPEAK, AMIKACINTROU, AMIKACIN in the last 72 hours.   Microbiology: No results found for this or any previous visit (from the past 720 hour(s)).  Medical History: Past Medical History  Diagnosis Date  . Hypertension   . Dialysis patient   . Renal disorder   . HIV disease   . Anemia   . Seizure   . Heart murmur   . Lymphocytosis 06/16/2014  . Noncompliance 03/06/2015    Medications:  Anti-infectives    Start     Dose/Rate Route Frequency Ordered Stop   08/09/15 0900  acyclovir (ZOVIRAX) 415 mg in dextrose 5 % 100 mL IVPB     5 mg/kg  83 kg 108.3 mL/hr over 60 Minutes Intravenous Every 24 hours 08/09/15 0822     08/09/15 0745  vancomycin (VANCOCIN) IVPB 1000 mg/200 mL premix     1,000 mg 200 mL/hr over 60 Minutes Intravenous  Once 08/09/15 0734     08/09/15 0715  valACYclovir (VALTREX) tablet 1,000 mg  Status:  Discontinued     1,000 mg Oral  Once 08/09/15 [id number] 08/09/15 0755     Assessment: 39 yo male admitted with progression of rash on L side of face. He is ESRD on HD-MWF.  Was recently diagnosed with Shingles but has only been taking Valtrex PTA for the past two days, therefore Pharmacy consulted to dose IV Acyclovir. Pt received vancomycin 1 g IV x1 in ED - this is a small load for this patient, expect ~40% to be removed with dialysis. If patient indeed has HD today, will begin Ancef today after dialysis.    Goal of Therapy:  Resolution of infection   Plan:  -Due to ESRD will give patient acyclovir 5 mg/kg IV q24h -Monitor HD tolerance -Ancef 2 g IV qHD    [name], PharmD, BCPS Clinical Pharmacist Pager: [phone] 08/09/2015 8:23 AM

## 2015-08-10 NOTE — ED Provider Notes (Signed)
Medical screening examination/treatment/procedure(s) were performed by non-physician practitioner and as supervising physician I was immediately available for consultation/collaboration.   EKG Interpretation None     713 am Case d/w Dr. Anderson Malta who will see the patient in the ED.    Kristeen Miss will coordinate care with Ophtho and ID  Sarrinah Gardin, MD 08/10/15 [id number]

## 2015-08-11 DIAGNOSIS — B20 Human immunodeficiency virus [HIV] disease: Secondary | ICD-10-CM | POA: Diagnosis not present

## 2015-08-11 DIAGNOSIS — Z992 Dependence on renal dialysis: Secondary | ICD-10-CM | POA: Diagnosis not present

## 2015-08-11 DIAGNOSIS — N2581 Secondary hyperparathyroidism of renal origin: Secondary | ICD-10-CM | POA: Diagnosis not present

## 2015-08-11 DIAGNOSIS — N186 End stage renal disease: Secondary | ICD-10-CM | POA: Diagnosis not present

## 2015-08-11 DIAGNOSIS — D631 Anemia in chronic kidney disease: Secondary | ICD-10-CM | POA: Diagnosis not present

## 2015-08-14 DIAGNOSIS — D631 Anemia in chronic kidney disease: Secondary | ICD-10-CM | POA: Diagnosis not present

## 2015-08-14 DIAGNOSIS — N186 End stage renal disease: Secondary | ICD-10-CM | POA: Diagnosis not present

## 2015-08-16 DIAGNOSIS — D631 Anemia in chronic kidney disease: Secondary | ICD-10-CM | POA: Diagnosis not present

## 2015-08-16 DIAGNOSIS — N186 End stage renal disease: Secondary | ICD-10-CM | POA: Diagnosis not present

## 2015-08-18 DIAGNOSIS — N186 End stage renal disease: Secondary | ICD-10-CM | POA: Diagnosis not present

## 2015-08-18 DIAGNOSIS — D631 Anemia in chronic kidney disease: Secondary | ICD-10-CM | POA: Diagnosis not present

## 2015-08-21 DIAGNOSIS — B029 Zoster without complications: Secondary | ICD-10-CM | POA: Diagnosis not present

## 2015-08-21 DIAGNOSIS — D631 Anemia in chronic kidney disease: Secondary | ICD-10-CM | POA: Diagnosis not present

## 2015-08-21 DIAGNOSIS — N186 End stage renal disease: Secondary | ICD-10-CM | POA: Diagnosis not present

## 2015-08-23 DIAGNOSIS — B029 Zoster without complications: Secondary | ICD-10-CM | POA: Diagnosis not present

## 2015-08-23 DIAGNOSIS — Z21 Asymptomatic human immunodeficiency virus [HIV] infection status: Secondary | ICD-10-CM | POA: Diagnosis not present

## 2015-08-24 DIAGNOSIS — D631 Anemia in chronic kidney disease: Secondary | ICD-10-CM | POA: Diagnosis not present

## 2015-08-24 DIAGNOSIS — N186 End stage renal disease: Secondary | ICD-10-CM | POA: Diagnosis not present

## 2015-08-28 DIAGNOSIS — N186 End stage renal disease: Secondary | ICD-10-CM | POA: Diagnosis not present

## 2015-08-28 DIAGNOSIS — D631 Anemia in chronic kidney disease: Secondary | ICD-10-CM | POA: Diagnosis not present

## 2015-08-30 DIAGNOSIS — N186 End stage renal disease: Secondary | ICD-10-CM | POA: Diagnosis not present

## 2015-08-30 DIAGNOSIS — H1045 Other chronic allergic conjunctivitis: Secondary | ICD-10-CM | POA: Diagnosis not present

## 2015-08-30 DIAGNOSIS — H04123 Dry eye syndrome of bilateral lacrimal glands: Secondary | ICD-10-CM | POA: Diagnosis not present

## 2015-08-30 DIAGNOSIS — H3589 Other specified retinal disorders: Secondary | ICD-10-CM | POA: Diagnosis not present

## 2015-08-30 DIAGNOSIS — D631 Anemia in chronic kidney disease: Secondary | ICD-10-CM | POA: Diagnosis not present

## 2015-08-30 DIAGNOSIS — H40013 Open angle with borderline findings, low risk, bilateral: Secondary | ICD-10-CM | POA: Diagnosis not present

## 2015-08-30 DIAGNOSIS — H25013 Cortical age-related cataract, bilateral: Secondary | ICD-10-CM | POA: Diagnosis not present

## 2015-08-31 DIAGNOSIS — Z01810 Encounter for preprocedural cardiovascular examination: Secondary | ICD-10-CM | POA: Diagnosis not present

## 2015-08-31 DIAGNOSIS — I120 Hypertensive chronic kidney disease with stage 5 chronic kidney disease or end stage renal disease: Secondary | ICD-10-CM | POA: Diagnosis not present

## 2015-08-31 DIAGNOSIS — Z21 Asymptomatic human immunodeficiency virus [HIV] infection status: Secondary | ICD-10-CM | POA: Diagnosis not present

## 2015-08-31 DIAGNOSIS — B20 Human immunodeficiency virus [HIV] disease: Secondary | ICD-10-CM | POA: Diagnosis not present

## 2015-08-31 DIAGNOSIS — Z01818 Encounter for other preprocedural examination: Secondary | ICD-10-CM | POA: Diagnosis not present

## 2015-08-31 DIAGNOSIS — Z992 Dependence on renal dialysis: Secondary | ICD-10-CM | POA: Diagnosis not present

## 2015-08-31 DIAGNOSIS — N186 End stage renal disease: Secondary | ICD-10-CM | POA: Diagnosis not present

## 2015-09-01 DIAGNOSIS — N186 End stage renal disease: Secondary | ICD-10-CM | POA: Diagnosis not present

## 2015-09-01 DIAGNOSIS — D631 Anemia in chronic kidney disease: Secondary | ICD-10-CM | POA: Diagnosis not present

## 2015-09-04 DIAGNOSIS — N186 End stage renal disease: Secondary | ICD-10-CM | POA: Diagnosis not present

## 2015-09-04 DIAGNOSIS — D631 Anemia in chronic kidney disease: Secondary | ICD-10-CM | POA: Diagnosis not present

## 2015-09-06 DIAGNOSIS — D631 Anemia in chronic kidney disease: Secondary | ICD-10-CM | POA: Diagnosis not present

## 2015-09-06 DIAGNOSIS — N186 End stage renal disease: Secondary | ICD-10-CM | POA: Diagnosis not present

## 2015-09-07 DIAGNOSIS — E8779 Other fluid overload: Secondary | ICD-10-CM | POA: Diagnosis not present

## 2015-09-07 DIAGNOSIS — N186 End stage renal disease: Secondary | ICD-10-CM | POA: Diagnosis not present

## 2015-09-08 DIAGNOSIS — D631 Anemia in chronic kidney disease: Secondary | ICD-10-CM | POA: Diagnosis not present

## 2015-09-08 DIAGNOSIS — N186 End stage renal disease: Secondary | ICD-10-CM | POA: Diagnosis not present

## 2015-09-11 DIAGNOSIS — D631 Anemia in chronic kidney disease: Secondary | ICD-10-CM | POA: Diagnosis not present

## 2015-09-11 DIAGNOSIS — N186 End stage renal disease: Secondary | ICD-10-CM | POA: Diagnosis not present

## 2015-09-11 DIAGNOSIS — Z992 Dependence on renal dialysis: Secondary | ICD-10-CM | POA: Diagnosis not present

## 2015-09-11 DIAGNOSIS — B20 Human immunodeficiency virus [HIV] disease: Secondary | ICD-10-CM | POA: Diagnosis not present

## 2015-09-13 DIAGNOSIS — N2581 Secondary hyperparathyroidism of renal origin: Secondary | ICD-10-CM | POA: Diagnosis not present

## 2015-09-13 DIAGNOSIS — D631 Anemia in chronic kidney disease: Secondary | ICD-10-CM | POA: Diagnosis not present

## 2015-09-13 DIAGNOSIS — N186 End stage renal disease: Secondary | ICD-10-CM | POA: Diagnosis not present

## 2015-09-15 DIAGNOSIS — N2581 Secondary hyperparathyroidism of renal origin: Secondary | ICD-10-CM | POA: Diagnosis not present

## 2015-09-15 DIAGNOSIS — N186 End stage renal disease: Secondary | ICD-10-CM | POA: Diagnosis not present

## 2015-09-15 DIAGNOSIS — D631 Anemia in chronic kidney disease: Secondary | ICD-10-CM | POA: Diagnosis not present

## 2015-09-18 DIAGNOSIS — D631 Anemia in chronic kidney disease: Secondary | ICD-10-CM | POA: Diagnosis not present

## 2015-09-18 DIAGNOSIS — N186 End stage renal disease: Secondary | ICD-10-CM | POA: Diagnosis not present

## 2015-09-18 DIAGNOSIS — N2581 Secondary hyperparathyroidism of renal origin: Secondary | ICD-10-CM | POA: Diagnosis not present

## 2015-09-21 DIAGNOSIS — N186 End stage renal disease: Secondary | ICD-10-CM | POA: Diagnosis not present

## 2015-09-21 DIAGNOSIS — D631 Anemia in chronic kidney disease: Secondary | ICD-10-CM | POA: Diagnosis not present

## 2015-09-21 DIAGNOSIS — N2581 Secondary hyperparathyroidism of renal origin: Secondary | ICD-10-CM | POA: Diagnosis not present

## 2015-09-22 DIAGNOSIS — N2581 Secondary hyperparathyroidism of renal origin: Secondary | ICD-10-CM | POA: Diagnosis not present

## 2015-09-22 DIAGNOSIS — N186 End stage renal disease: Secondary | ICD-10-CM | POA: Diagnosis not present

## 2015-09-22 DIAGNOSIS — D631 Anemia in chronic kidney disease: Secondary | ICD-10-CM | POA: Diagnosis not present

## 2015-09-25 DIAGNOSIS — D631 Anemia in chronic kidney disease: Secondary | ICD-10-CM | POA: Diagnosis not present

## 2015-09-25 DIAGNOSIS — N186 End stage renal disease: Secondary | ICD-10-CM | POA: Diagnosis not present

## 2015-09-25 DIAGNOSIS — N2581 Secondary hyperparathyroidism of renal origin: Secondary | ICD-10-CM | POA: Diagnosis not present

## 2015-09-26 DIAGNOSIS — H3589 Other specified retinal disorders: Secondary | ICD-10-CM | POA: Diagnosis not present

## 2015-09-26 DIAGNOSIS — H04123 Dry eye syndrome of bilateral lacrimal glands: Secondary | ICD-10-CM | POA: Diagnosis not present

## 2015-09-26 DIAGNOSIS — H1045 Other chronic allergic conjunctivitis: Secondary | ICD-10-CM | POA: Diagnosis not present

## 2015-09-26 DIAGNOSIS — H40023 Open angle with borderline findings, high risk, bilateral: Secondary | ICD-10-CM | POA: Diagnosis not present

## 2015-09-26 DIAGNOSIS — H5712 Ocular pain, left eye: Secondary | ICD-10-CM | POA: Diagnosis not present

## 2015-09-27 DIAGNOSIS — D631 Anemia in chronic kidney disease: Secondary | ICD-10-CM | POA: Diagnosis not present

## 2015-09-27 DIAGNOSIS — N186 End stage renal disease: Secondary | ICD-10-CM | POA: Diagnosis not present

## 2015-09-27 DIAGNOSIS — N2581 Secondary hyperparathyroidism of renal origin: Secondary | ICD-10-CM | POA: Diagnosis not present

## 2015-09-29 DIAGNOSIS — D631 Anemia in chronic kidney disease: Secondary | ICD-10-CM | POA: Diagnosis not present

## 2015-09-29 DIAGNOSIS — H01021 Squamous blepharitis right upper eyelid: Secondary | ICD-10-CM | POA: Diagnosis not present

## 2015-09-29 DIAGNOSIS — N2581 Secondary hyperparathyroidism of renal origin: Secondary | ICD-10-CM | POA: Diagnosis not present

## 2015-09-29 DIAGNOSIS — N186 End stage renal disease: Secondary | ICD-10-CM | POA: Diagnosis not present

## 2015-09-29 DIAGNOSIS — H10413 Chronic giant papillary conjunctivitis, bilateral: Secondary | ICD-10-CM | POA: Diagnosis not present

## 2015-09-29 DIAGNOSIS — H01022 Squamous blepharitis right lower eyelid: Secondary | ICD-10-CM | POA: Diagnosis not present

## 2015-09-29 DIAGNOSIS — H0015 Chalazion left lower eyelid: Secondary | ICD-10-CM | POA: Diagnosis not present

## 2015-09-29 DIAGNOSIS — H01024 Squamous blepharitis left upper eyelid: Secondary | ICD-10-CM | POA: Diagnosis not present

## 2015-09-29 DIAGNOSIS — H01025 Squamous blepharitis left lower eyelid: Secondary | ICD-10-CM | POA: Diagnosis not present

## 2015-10-02 DIAGNOSIS — N186 End stage renal disease: Secondary | ICD-10-CM | POA: Diagnosis not present

## 2015-10-02 DIAGNOSIS — D631 Anemia in chronic kidney disease: Secondary | ICD-10-CM | POA: Diagnosis not present

## 2015-10-02 DIAGNOSIS — N2581 Secondary hyperparathyroidism of renal origin: Secondary | ICD-10-CM | POA: Diagnosis not present

## 2015-10-04 DIAGNOSIS — N2581 Secondary hyperparathyroidism of renal origin: Secondary | ICD-10-CM | POA: Diagnosis not present

## 2015-10-04 DIAGNOSIS — N186 End stage renal disease: Secondary | ICD-10-CM | POA: Diagnosis not present

## 2015-10-07 DIAGNOSIS — N2581 Secondary hyperparathyroidism of renal origin: Secondary | ICD-10-CM | POA: Diagnosis not present

## 2015-10-07 DIAGNOSIS — N186 End stage renal disease: Secondary | ICD-10-CM | POA: Diagnosis not present

## 2015-10-09 DIAGNOSIS — N186 End stage renal disease: Secondary | ICD-10-CM | POA: Diagnosis not present

## 2015-10-09 DIAGNOSIS — N2581 Secondary hyperparathyroidism of renal origin: Secondary | ICD-10-CM | POA: Diagnosis not present

## 2015-10-09 DIAGNOSIS — D631 Anemia in chronic kidney disease: Secondary | ICD-10-CM | POA: Diagnosis not present

## 2015-10-11 DIAGNOSIS — Z992 Dependence on renal dialysis: Secondary | ICD-10-CM | POA: Diagnosis not present

## 2015-10-11 DIAGNOSIS — D631 Anemia in chronic kidney disease: Secondary | ICD-10-CM | POA: Diagnosis not present

## 2015-10-11 DIAGNOSIS — N2581 Secondary hyperparathyroidism of renal origin: Secondary | ICD-10-CM | POA: Diagnosis not present

## 2015-10-11 DIAGNOSIS — N186 End stage renal disease: Secondary | ICD-10-CM | POA: Diagnosis not present

## 2015-10-11 DIAGNOSIS — B20 Human immunodeficiency virus [HIV] disease: Secondary | ICD-10-CM | POA: Diagnosis not present

## 2015-10-13 DIAGNOSIS — N2581 Secondary hyperparathyroidism of renal origin: Secondary | ICD-10-CM | POA: Diagnosis not present

## 2015-10-13 DIAGNOSIS — N186 End stage renal disease: Secondary | ICD-10-CM | POA: Diagnosis not present

## 2015-10-16 DIAGNOSIS — N2581 Secondary hyperparathyroidism of renal origin: Secondary | ICD-10-CM | POA: Diagnosis not present

## 2015-10-16 DIAGNOSIS — N186 End stage renal disease: Secondary | ICD-10-CM | POA: Diagnosis not present

## 2015-10-18 DIAGNOSIS — N2581 Secondary hyperparathyroidism of renal origin: Secondary | ICD-10-CM | POA: Diagnosis not present

## 2015-10-18 DIAGNOSIS — N186 End stage renal disease: Secondary | ICD-10-CM | POA: Diagnosis not present

## 2015-10-20 DIAGNOSIS — N2581 Secondary hyperparathyroidism of renal origin: Secondary | ICD-10-CM | POA: Diagnosis not present

## 2015-10-20 DIAGNOSIS — N186 End stage renal disease: Secondary | ICD-10-CM | POA: Diagnosis not present

## 2015-10-23 DIAGNOSIS — N186 End stage renal disease: Secondary | ICD-10-CM | POA: Diagnosis not present

## 2015-10-23 DIAGNOSIS — N2581 Secondary hyperparathyroidism of renal origin: Secondary | ICD-10-CM | POA: Diagnosis not present

## 2015-10-25 DIAGNOSIS — N2581 Secondary hyperparathyroidism of renal origin: Secondary | ICD-10-CM | POA: Diagnosis not present

## 2015-10-25 DIAGNOSIS — N186 End stage renal disease: Secondary | ICD-10-CM | POA: Diagnosis not present

## 2015-10-26 ENCOUNTER — Other Ambulatory Visit: Payer: Medicare Other

## 2015-10-26 DIAGNOSIS — B20 Human immunodeficiency virus [HIV] disease: Secondary | ICD-10-CM | POA: Diagnosis not present

## 2015-10-26 LAB — CBC WITH DIFFERENTIAL/PLATELET
BASOS ABS: 0 10*3/uL (ref 0.0–0.1)
Basophils Relative: 0 % (ref 0–1)
EOS ABS: 0.3 10*3/uL (ref 0.0–0.7)
EOS PCT: 4 % (ref 0–5)
HCT: 40.9 % (ref 39.0–52.0)
Hemoglobin: 14.9 g/dL (ref 13.0–17.0)
LYMPHS ABS: 3.7 10*3/uL (ref 0.7–4.0)
Lymphocytes Relative: 58 % — ABNORMAL HIGH (ref 12–46)
MCH: 41.3 pg — AB (ref 26.0–34.0)
MCHC: 36.4 g/dL — AB (ref 30.0–36.0)
MCV: 113.3 fL — AB (ref 78.0–100.0)
MONO ABS: 0.6 10*3/uL (ref 0.1–1.0)
MONOS PCT: 9 % (ref 3–12)
MPV: 9.6 fL (ref 8.6–12.4)
Neutro Abs: 1.9 10*3/uL (ref 1.7–7.7)
Neutrophils Relative %: 29 % — ABNORMAL LOW (ref 43–77)
PLATELETS: 245 10*3/uL (ref 150–400)
RBC: 3.61 MIL/uL — ABNORMAL LOW (ref 4.22–5.81)
RDW: 14.5 % (ref 11.5–15.5)
WBC: 6.4 10*3/uL (ref 4.0–10.5)

## 2015-10-27 DIAGNOSIS — N186 End stage renal disease: Secondary | ICD-10-CM | POA: Diagnosis not present

## 2015-10-27 DIAGNOSIS — N2581 Secondary hyperparathyroidism of renal origin: Secondary | ICD-10-CM | POA: Diagnosis not present

## 2015-10-27 LAB — HIV-1 RNA QUANT-NO REFLEX-BLD
HIV 1 RNA QUANT: 56 {copies}/mL — AB (ref <20)
HIV-1 RNA QUANT, LOG: 1.75 {Log_copies}/mL — AB (ref <1.30)

## 2015-10-27 LAB — RPR

## 2015-10-27 LAB — T-HELPER CELL (CD4) - (RCID CLINIC ONLY)
CD4 T CELL HELPER: 19 % — AB (ref 33–55)
CD4 T Cell Abs: 1120 /uL (ref 400–2700)

## 2015-10-30 DIAGNOSIS — N2581 Secondary hyperparathyroidism of renal origin: Secondary | ICD-10-CM | POA: Diagnosis not present

## 2015-10-30 DIAGNOSIS — N186 End stage renal disease: Secondary | ICD-10-CM | POA: Diagnosis not present

## 2015-10-31 DIAGNOSIS — H01021 Squamous blepharitis right upper eyelid: Secondary | ICD-10-CM | POA: Diagnosis not present

## 2015-10-31 DIAGNOSIS — H01024 Squamous blepharitis left upper eyelid: Secondary | ICD-10-CM | POA: Diagnosis not present

## 2015-10-31 DIAGNOSIS — H10413 Chronic giant papillary conjunctivitis, bilateral: Secondary | ICD-10-CM | POA: Diagnosis not present

## 2015-10-31 DIAGNOSIS — H01025 Squamous blepharitis left lower eyelid: Secondary | ICD-10-CM | POA: Diagnosis not present

## 2015-10-31 DIAGNOSIS — H01022 Squamous blepharitis right lower eyelid: Secondary | ICD-10-CM | POA: Diagnosis not present

## 2015-10-31 DIAGNOSIS — H0015 Chalazion left lower eyelid: Secondary | ICD-10-CM | POA: Diagnosis not present

## 2015-11-01 DIAGNOSIS — N2581 Secondary hyperparathyroidism of renal origin: Secondary | ICD-10-CM | POA: Diagnosis not present

## 2015-11-01 DIAGNOSIS — N186 End stage renal disease: Secondary | ICD-10-CM | POA: Diagnosis not present

## 2015-11-03 DIAGNOSIS — N2581 Secondary hyperparathyroidism of renal origin: Secondary | ICD-10-CM | POA: Diagnosis not present

## 2015-11-03 DIAGNOSIS — N186 End stage renal disease: Secondary | ICD-10-CM | POA: Diagnosis not present

## 2015-11-06 DIAGNOSIS — N186 End stage renal disease: Secondary | ICD-10-CM | POA: Diagnosis not present

## 2015-11-06 DIAGNOSIS — N2581 Secondary hyperparathyroidism of renal origin: Secondary | ICD-10-CM | POA: Diagnosis not present

## 2015-11-08 DIAGNOSIS — N186 End stage renal disease: Secondary | ICD-10-CM | POA: Diagnosis not present

## 2015-11-08 DIAGNOSIS — N2581 Secondary hyperparathyroidism of renal origin: Secondary | ICD-10-CM | POA: Diagnosis not present

## 2015-11-09 ENCOUNTER — Ambulatory Visit: Payer: Medicare Other | Admitting: Internal Medicine

## 2015-11-10 DIAGNOSIS — N186 End stage renal disease: Secondary | ICD-10-CM | POA: Diagnosis not present

## 2015-11-10 DIAGNOSIS — N2581 Secondary hyperparathyroidism of renal origin: Secondary | ICD-10-CM | POA: Diagnosis not present

## 2015-11-11 DIAGNOSIS — Z992 Dependence on renal dialysis: Secondary | ICD-10-CM | POA: Diagnosis not present

## 2015-11-11 DIAGNOSIS — B20 Human immunodeficiency virus [HIV] disease: Secondary | ICD-10-CM | POA: Diagnosis not present

## 2015-11-11 DIAGNOSIS — N186 End stage renal disease: Secondary | ICD-10-CM | POA: Diagnosis not present

## 2015-11-13 DIAGNOSIS — N2581 Secondary hyperparathyroidism of renal origin: Secondary | ICD-10-CM | POA: Diagnosis not present

## 2015-11-13 DIAGNOSIS — N186 End stage renal disease: Secondary | ICD-10-CM | POA: Diagnosis not present

## 2015-11-14 DIAGNOSIS — H0015 Chalazion left lower eyelid: Secondary | ICD-10-CM | POA: Diagnosis not present

## 2015-11-15 DIAGNOSIS — N186 End stage renal disease: Secondary | ICD-10-CM | POA: Diagnosis not present

## 2015-11-15 DIAGNOSIS — N2581 Secondary hyperparathyroidism of renal origin: Secondary | ICD-10-CM | POA: Diagnosis not present

## 2015-11-17 DIAGNOSIS — N186 End stage renal disease: Secondary | ICD-10-CM | POA: Diagnosis not present

## 2015-11-17 DIAGNOSIS — N2581 Secondary hyperparathyroidism of renal origin: Secondary | ICD-10-CM | POA: Diagnosis not present

## 2015-11-20 DIAGNOSIS — N2581 Secondary hyperparathyroidism of renal origin: Secondary | ICD-10-CM | POA: Diagnosis not present

## 2015-11-20 DIAGNOSIS — N186 End stage renal disease: Secondary | ICD-10-CM | POA: Diagnosis not present

## 2015-11-22 DIAGNOSIS — N186 End stage renal disease: Secondary | ICD-10-CM | POA: Diagnosis not present

## 2015-11-22 DIAGNOSIS — N2581 Secondary hyperparathyroidism of renal origin: Secondary | ICD-10-CM | POA: Diagnosis not present

## 2015-11-24 DIAGNOSIS — N2581 Secondary hyperparathyroidism of renal origin: Secondary | ICD-10-CM | POA: Diagnosis not present

## 2015-11-24 DIAGNOSIS — N186 End stage renal disease: Secondary | ICD-10-CM | POA: Diagnosis not present

## 2015-11-28 DIAGNOSIS — N2581 Secondary hyperparathyroidism of renal origin: Secondary | ICD-10-CM | POA: Diagnosis not present

## 2015-11-28 DIAGNOSIS — N186 End stage renal disease: Secondary | ICD-10-CM | POA: Diagnosis not present

## 2015-11-29 DIAGNOSIS — N2581 Secondary hyperparathyroidism of renal origin: Secondary | ICD-10-CM | POA: Diagnosis not present

## 2015-11-29 DIAGNOSIS — N186 End stage renal disease: Secondary | ICD-10-CM | POA: Diagnosis not present

## 2015-12-01 DIAGNOSIS — N186 End stage renal disease: Secondary | ICD-10-CM | POA: Diagnosis not present

## 2015-12-01 DIAGNOSIS — N2581 Secondary hyperparathyroidism of renal origin: Secondary | ICD-10-CM | POA: Diagnosis not present

## 2015-12-04 DIAGNOSIS — N186 End stage renal disease: Secondary | ICD-10-CM | POA: Diagnosis not present

## 2015-12-04 DIAGNOSIS — N2581 Secondary hyperparathyroidism of renal origin: Secondary | ICD-10-CM | POA: Diagnosis not present

## 2015-12-05 DIAGNOSIS — I871 Compression of vein: Secondary | ICD-10-CM | POA: Diagnosis not present

## 2015-12-05 DIAGNOSIS — N186 End stage renal disease: Secondary | ICD-10-CM | POA: Diagnosis not present

## 2015-12-05 DIAGNOSIS — Z992 Dependence on renal dialysis: Secondary | ICD-10-CM | POA: Diagnosis not present

## 2015-12-05 DIAGNOSIS — T82858D Stenosis of vascular prosthetic devices, implants and grafts, subsequent encounter: Secondary | ICD-10-CM | POA: Diagnosis not present

## 2015-12-06 DIAGNOSIS — N2581 Secondary hyperparathyroidism of renal origin: Secondary | ICD-10-CM | POA: Diagnosis not present

## 2015-12-06 DIAGNOSIS — N186 End stage renal disease: Secondary | ICD-10-CM | POA: Diagnosis not present

## 2015-12-08 DIAGNOSIS — N2581 Secondary hyperparathyroidism of renal origin: Secondary | ICD-10-CM | POA: Diagnosis not present

## 2015-12-08 DIAGNOSIS — N186 End stage renal disease: Secondary | ICD-10-CM | POA: Diagnosis not present

## 2015-12-11 DIAGNOSIS — N186 End stage renal disease: Secondary | ICD-10-CM | POA: Diagnosis not present

## 2015-12-11 DIAGNOSIS — N2581 Secondary hyperparathyroidism of renal origin: Secondary | ICD-10-CM | POA: Diagnosis not present

## 2015-12-12 DIAGNOSIS — B20 Human immunodeficiency virus [HIV] disease: Secondary | ICD-10-CM | POA: Diagnosis not present

## 2015-12-12 DIAGNOSIS — N186 End stage renal disease: Secondary | ICD-10-CM | POA: Diagnosis not present

## 2015-12-12 DIAGNOSIS — Z992 Dependence on renal dialysis: Secondary | ICD-10-CM | POA: Diagnosis not present

## 2015-12-13 DIAGNOSIS — N2581 Secondary hyperparathyroidism of renal origin: Secondary | ICD-10-CM | POA: Diagnosis not present

## 2015-12-13 DIAGNOSIS — N186 End stage renal disease: Secondary | ICD-10-CM | POA: Diagnosis not present

## 2015-12-13 DIAGNOSIS — D631 Anemia in chronic kidney disease: Secondary | ICD-10-CM | POA: Diagnosis not present

## 2015-12-15 DIAGNOSIS — N186 End stage renal disease: Secondary | ICD-10-CM | POA: Diagnosis not present

## 2015-12-15 DIAGNOSIS — N2581 Secondary hyperparathyroidism of renal origin: Secondary | ICD-10-CM | POA: Diagnosis not present

## 2015-12-15 DIAGNOSIS — D631 Anemia in chronic kidney disease: Secondary | ICD-10-CM | POA: Diagnosis not present

## 2015-12-18 DIAGNOSIS — N2581 Secondary hyperparathyroidism of renal origin: Secondary | ICD-10-CM | POA: Diagnosis not present

## 2015-12-18 DIAGNOSIS — D631 Anemia in chronic kidney disease: Secondary | ICD-10-CM | POA: Diagnosis not present

## 2015-12-18 DIAGNOSIS — N186 End stage renal disease: Secondary | ICD-10-CM | POA: Diagnosis not present

## 2015-12-20 DIAGNOSIS — N2581 Secondary hyperparathyroidism of renal origin: Secondary | ICD-10-CM | POA: Diagnosis not present

## 2015-12-20 DIAGNOSIS — D631 Anemia in chronic kidney disease: Secondary | ICD-10-CM | POA: Diagnosis not present

## 2015-12-20 DIAGNOSIS — N186 End stage renal disease: Secondary | ICD-10-CM | POA: Diagnosis not present

## 2015-12-22 DIAGNOSIS — D631 Anemia in chronic kidney disease: Secondary | ICD-10-CM | POA: Diagnosis not present

## 2015-12-22 DIAGNOSIS — N186 End stage renal disease: Secondary | ICD-10-CM | POA: Diagnosis not present

## 2015-12-22 DIAGNOSIS — N2581 Secondary hyperparathyroidism of renal origin: Secondary | ICD-10-CM | POA: Diagnosis not present

## 2015-12-25 DIAGNOSIS — N186 End stage renal disease: Secondary | ICD-10-CM | POA: Diagnosis not present

## 2015-12-25 DIAGNOSIS — N2581 Secondary hyperparathyroidism of renal origin: Secondary | ICD-10-CM | POA: Diagnosis not present

## 2015-12-25 DIAGNOSIS — D631 Anemia in chronic kidney disease: Secondary | ICD-10-CM | POA: Diagnosis not present

## 2015-12-27 DIAGNOSIS — N186 End stage renal disease: Secondary | ICD-10-CM | POA: Diagnosis not present

## 2015-12-27 DIAGNOSIS — D631 Anemia in chronic kidney disease: Secondary | ICD-10-CM | POA: Diagnosis not present

## 2015-12-27 DIAGNOSIS — N2581 Secondary hyperparathyroidism of renal origin: Secondary | ICD-10-CM | POA: Diagnosis not present

## 2015-12-29 DIAGNOSIS — N186 End stage renal disease: Secondary | ICD-10-CM | POA: Diagnosis not present

## 2015-12-29 DIAGNOSIS — N2581 Secondary hyperparathyroidism of renal origin: Secondary | ICD-10-CM | POA: Diagnosis not present

## 2015-12-29 DIAGNOSIS — D631 Anemia in chronic kidney disease: Secondary | ICD-10-CM | POA: Diagnosis not present

## 2015-12-30 DIAGNOSIS — E8779 Other fluid overload: Secondary | ICD-10-CM | POA: Diagnosis not present

## 2015-12-30 DIAGNOSIS — N186 End stage renal disease: Secondary | ICD-10-CM | POA: Diagnosis not present

## 2015-12-30 DIAGNOSIS — R600 Localized edema: Secondary | ICD-10-CM | POA: Diagnosis not present

## 2016-01-01 ENCOUNTER — Other Ambulatory Visit: Payer: Self-pay | Admitting: Infectious Disease

## 2016-01-01 DIAGNOSIS — B20 Human immunodeficiency virus [HIV] disease: Secondary | ICD-10-CM

## 2016-01-01 DIAGNOSIS — D631 Anemia in chronic kidney disease: Secondary | ICD-10-CM | POA: Diagnosis not present

## 2016-01-01 DIAGNOSIS — N186 End stage renal disease: Secondary | ICD-10-CM | POA: Diagnosis not present

## 2016-01-01 DIAGNOSIS — N2581 Secondary hyperparathyroidism of renal origin: Secondary | ICD-10-CM | POA: Diagnosis not present

## 2016-01-02 DIAGNOSIS — Z21 Asymptomatic human immunodeficiency virus [HIV] infection status: Secondary | ICD-10-CM | POA: Diagnosis not present

## 2016-01-02 DIAGNOSIS — N08 Glomerular disorders in diseases classified elsewhere: Secondary | ICD-10-CM | POA: Diagnosis not present

## 2016-01-02 DIAGNOSIS — B20 Human immunodeficiency virus [HIV] disease: Secondary | ICD-10-CM | POA: Diagnosis not present

## 2016-01-02 DIAGNOSIS — I120 Hypertensive chronic kidney disease with stage 5 chronic kidney disease or end stage renal disease: Secondary | ICD-10-CM | POA: Diagnosis not present

## 2016-01-02 DIAGNOSIS — Z01818 Encounter for other preprocedural examination: Secondary | ICD-10-CM | POA: Diagnosis not present

## 2016-01-02 DIAGNOSIS — N186 End stage renal disease: Secondary | ICD-10-CM | POA: Diagnosis not present

## 2016-01-02 DIAGNOSIS — Z992 Dependence on renal dialysis: Secondary | ICD-10-CM | POA: Diagnosis not present

## 2016-01-02 DIAGNOSIS — Z7682 Awaiting organ transplant status: Secondary | ICD-10-CM | POA: Diagnosis not present

## 2016-01-03 DIAGNOSIS — N2581 Secondary hyperparathyroidism of renal origin: Secondary | ICD-10-CM | POA: Diagnosis not present

## 2016-01-03 DIAGNOSIS — D631 Anemia in chronic kidney disease: Secondary | ICD-10-CM | POA: Diagnosis not present

## 2016-01-03 DIAGNOSIS — N186 End stage renal disease: Secondary | ICD-10-CM | POA: Diagnosis not present

## 2016-01-05 DIAGNOSIS — N186 End stage renal disease: Secondary | ICD-10-CM | POA: Diagnosis not present

## 2016-01-05 DIAGNOSIS — D631 Anemia in chronic kidney disease: Secondary | ICD-10-CM | POA: Diagnosis not present

## 2016-01-05 DIAGNOSIS — N2581 Secondary hyperparathyroidism of renal origin: Secondary | ICD-10-CM | POA: Diagnosis not present

## 2016-01-08 DIAGNOSIS — N186 End stage renal disease: Secondary | ICD-10-CM | POA: Diagnosis not present

## 2016-01-08 DIAGNOSIS — N2581 Secondary hyperparathyroidism of renal origin: Secondary | ICD-10-CM | POA: Diagnosis not present

## 2016-01-08 DIAGNOSIS — D631 Anemia in chronic kidney disease: Secondary | ICD-10-CM | POA: Diagnosis not present

## 2016-01-09 DIAGNOSIS — B20 Human immunodeficiency virus [HIV] disease: Secondary | ICD-10-CM | POA: Diagnosis not present

## 2016-01-09 DIAGNOSIS — N186 End stage renal disease: Secondary | ICD-10-CM | POA: Diagnosis not present

## 2016-01-09 DIAGNOSIS — Z992 Dependence on renal dialysis: Secondary | ICD-10-CM | POA: Diagnosis not present

## 2016-01-10 DIAGNOSIS — N186 End stage renal disease: Secondary | ICD-10-CM | POA: Diagnosis not present

## 2016-01-10 DIAGNOSIS — D631 Anemia in chronic kidney disease: Secondary | ICD-10-CM | POA: Diagnosis not present

## 2016-01-10 DIAGNOSIS — N2581 Secondary hyperparathyroidism of renal origin: Secondary | ICD-10-CM | POA: Diagnosis not present

## 2016-01-11 ENCOUNTER — Ambulatory Visit (INDEPENDENT_AMBULATORY_CARE_PROVIDER_SITE_OTHER): Payer: Medicare Other | Admitting: Internal Medicine

## 2016-01-11 ENCOUNTER — Encounter: Payer: Self-pay | Admitting: Internal Medicine

## 2016-01-11 VITALS — BP 128/89 | HR 99 | Temp 98.2°F | Ht 67.0 in | Wt 204.0 lb

## 2016-01-11 DIAGNOSIS — Z992 Dependence on renal dialysis: Secondary | ICD-10-CM

## 2016-01-11 DIAGNOSIS — B0230 Zoster ocular disease, unspecified: Secondary | ICD-10-CM | POA: Diagnosis not present

## 2016-01-11 DIAGNOSIS — R600 Localized edema: Secondary | ICD-10-CM | POA: Diagnosis not present

## 2016-01-11 DIAGNOSIS — B20 Human immunodeficiency virus [HIV] disease: Secondary | ICD-10-CM

## 2016-01-11 DIAGNOSIS — N186 End stage renal disease: Secondary | ICD-10-CM | POA: Diagnosis not present

## 2016-01-11 DIAGNOSIS — E8779 Other fluid overload: Secondary | ICD-10-CM | POA: Diagnosis not present

## 2016-01-11 MED ORDER — ABACAVIR SULFATE 300 MG PO TABS: 600.0000 mg | ORAL_TABLET | Freq: Every day | ORAL | Status: DC

## 2016-01-11 MED ORDER — LAMIVUDINE 100 MG PO TABS: 50.0000 mg | ORAL_TABLET | Freq: Every day | ORAL | Status: DC

## 2016-01-11 MED ORDER — DOLUTEGRAVIR SODIUM 50 MG PO TABS: 50.0000 mg | ORAL_TABLET | Freq: Every day | ORAL | Status: DC

## 2016-01-11 MED ORDER — RILPIVIRINE HCL 25 MG PO TABS: 25.0000 mg | ORAL_TABLET | Freq: Every day | ORAL | Status: DC

## 2016-01-11 NOTE — Assessment & Plan Note (Signed)
Resolved

## 2016-01-11 NOTE — Assessment & Plan Note (Signed)
Labs today and rtc 4 months unless concerns.  I did refill all his HIV medicines and he will continue to follow here.

## 2016-01-11 NOTE — Assessment & Plan Note (Signed)
Continuing transplant evaluation at [facility] and medications adjusted appropriately.

## 2016-01-11 NOTE — Progress Notes (Signed)
   Subjective:    Patient ID: Dylan Barber., male    DOB: Nov 20, 1975, 40 y.o.   MRN: [id number]  HPI Here for follow up for HIV.    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  He then was evaluated by [facility] for possible renal transplant and saw Kendall Flack, PharmD and regimen appropriately adjusted to rilpivirine, 3TC, Tivicay and abacavir and is tolerating this well.  Denies any missed doses.  He also recently saw Marlinda Mike at [facility].  Last labs with CD4 of 1120 and viral load of 56 copies.  No labs prior to today's visit.  Wants to continue his HIV care here and has follow up with Apolonio Schneiders in 1 year.  No complaints now.  Recent shingles in face.  Some weight gain he attributes to lack of exercise with recent shingles.     Review of Systems  Constitutional: Negative for fatigue and unexpected weight change.  HENT: Negative for trouble swallowing.   Gastrointestinal: Negative for nausea and diarrhea.  Skin: Negative for rash.  Neurological: Negative for dizziness, light-headedness and headaches.       Objective:   Physical Exam  Constitutional: He appears well-developed and well-nourished. No distress.  HENT:  Mouth/Throat: No oropharyngeal exudate.  Eyes: No scleral icterus.  Cardiovascular: Normal rate, regular rhythm and normal heart sounds.   No murmur heard. Pulmonary/Chest: Effort normal and breath sounds normal. No respiratory distress.  Lymphadenopathy:    He has no cervical adenopathy.  Skin: No rash noted.    Social History   Social History  . Marital Status: Single    Spouse Name: N/A  . Number of Children: N/A  . Years of Education: N/A   Occupational History  . Not on file.   Social History Main Topics  . Smoking status: Never Smoker   . Smokeless tobacco: Never Used  . Alcohol Use: No  . Drug Use: No  . Sexual Activity:    Partners: Female    Patent examiner Protection: Condom     Comment: pt. given  condoms   Other Topics Concern  . Not on file   Social History Narrative   Originally from [place],  States father worked at [employer].  Moved from [place], [place]. On 6/21 to live with father.         Assessment & Plan:

## 2016-01-12 DIAGNOSIS — N186 End stage renal disease: Secondary | ICD-10-CM | POA: Diagnosis not present

## 2016-01-12 DIAGNOSIS — N2581 Secondary hyperparathyroidism of renal origin: Secondary | ICD-10-CM | POA: Diagnosis not present

## 2016-01-12 DIAGNOSIS — D631 Anemia in chronic kidney disease: Secondary | ICD-10-CM | POA: Diagnosis not present

## 2016-01-12 LAB — T-HELPER CELL (CD4) - (RCID CLINIC ONLY)
CD4 T CELL ABS: 700 /uL (ref 400–2700)
CD4 T CELL HELPER: 21 % — AB (ref 33–55)

## 2016-01-12 LAB — HIV-1 RNA QUANT-NO REFLEX-BLD: HIV-1 RNA Quant, Log: <1.3 Log copies/mL (ref <1.30)

## 2016-01-15 DIAGNOSIS — N2581 Secondary hyperparathyroidism of renal origin: Secondary | ICD-10-CM | POA: Diagnosis not present

## 2016-01-15 DIAGNOSIS — D631 Anemia in chronic kidney disease: Secondary | ICD-10-CM | POA: Diagnosis not present

## 2016-01-15 DIAGNOSIS — N186 End stage renal disease: Secondary | ICD-10-CM | POA: Diagnosis not present

## 2016-01-17 DIAGNOSIS — N186 End stage renal disease: Secondary | ICD-10-CM | POA: Diagnosis not present

## 2016-01-17 DIAGNOSIS — N2581 Secondary hyperparathyroidism of renal origin: Secondary | ICD-10-CM | POA: Diagnosis not present

## 2016-01-17 DIAGNOSIS — D631 Anemia in chronic kidney disease: Secondary | ICD-10-CM | POA: Diagnosis not present

## 2016-01-18 ENCOUNTER — Encounter: Payer: Self-pay | Admitting: Internal Medicine

## 2016-01-18 ENCOUNTER — Ambulatory Visit (INDEPENDENT_AMBULATORY_CARE_PROVIDER_SITE_OTHER): Payer: Medicare Other | Admitting: Internal Medicine

## 2016-01-18 VITALS — BP 111/73 | HR 111 | Temp 98.0°F | Wt 202.0 lb

## 2016-01-18 DIAGNOSIS — N186 End stage renal disease: Secondary | ICD-10-CM | POA: Diagnosis not present

## 2016-01-18 DIAGNOSIS — Z113 Encounter for screening for infections with a predominantly sexual mode of transmission: Secondary | ICD-10-CM | POA: Diagnosis not present

## 2016-01-18 DIAGNOSIS — B20 Human immunodeficiency virus [HIV] disease: Secondary | ICD-10-CM | POA: Diagnosis present

## 2016-01-18 DIAGNOSIS — Z992 Dependence on renal dialysis: Secondary | ICD-10-CM

## 2016-01-18 NOTE — Assessment & Plan Note (Signed)
Ongoing evaluation for transplant.

## 2016-01-18 NOTE — Assessment & Plan Note (Signed)
Labs look great, he is doing very well and understands the need for compliance. rtc 4 months.

## 2016-01-18 NOTE — Progress Notes (Signed)
   Subjective:    Patient ID: Dylan Barber., male    DOB: 03/31/1976, 40 y.o.   MRN: [id number]  HPI Here for follow up for HIV after labs.    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  He then was evaluated by [facility] for possible renal transplant and saw Kendall Flack, PharmD and regimen appropriately adjusted to rilpivirine, 3TC, Tivicay and abacavir and is tolerating this well.  Denies any missed doses.  He also recently saw Marlinda Mike at [facility] clinic.  Last labs with CD4 of 1120 and viral load of 56 copies. Labs reviewed and CD 4 of 700, undetectable viral load.  Some weight gain he attributes to lack of exercise with recent shingles.     Review of Systems  Constitutional: Negative for fatigue and unexpected weight change.  HENT: Negative for trouble swallowing.   Gastrointestinal: Negative for nausea and diarrhea.  Skin: Negative for rash.  Neurological: Negative for dizziness, light-headedness and headaches.       Objective:   Physical Exam  Constitutional: He appears well-developed and well-nourished. No distress.  HENT:  Mouth/Throat: No oropharyngeal exudate.  Eyes: No scleral icterus.  Cardiovascular: Normal rate, regular rhythm and normal heart sounds.   No murmur heard. Pulmonary/Chest: Effort normal and breath sounds normal. No respiratory distress.  Lymphadenopathy:    He has no cervical adenopathy.  Skin: No rash noted.    Social History   Social History  . Marital Status: Single    Spouse Name: N/A  . Number of Children: N/A  . Years of Education: N/A   Occupational History  . Not on file.   Social History Main Topics  . Smoking status: Never Smoker   . Smokeless tobacco: Never Used  . Alcohol Use: No  . Drug Use: No  . Sexual Activity:    Partners: Female    Patent examiner Protection: Condom     Comment: pt. given condoms   Other Topics Concern  . Not on file   Social History Narrative   Originally  from [place],  States father worked at [employer].  Moved from [place], [place]. On 6/21 to live with father.         Assessment & Plan:

## 2016-01-19 DIAGNOSIS — D631 Anemia in chronic kidney disease: Secondary | ICD-10-CM | POA: Diagnosis not present

## 2016-01-19 DIAGNOSIS — N186 End stage renal disease: Secondary | ICD-10-CM | POA: Diagnosis not present

## 2016-01-19 DIAGNOSIS — N2581 Secondary hyperparathyroidism of renal origin: Secondary | ICD-10-CM | POA: Diagnosis not present

## 2016-01-22 DIAGNOSIS — N2581 Secondary hyperparathyroidism of renal origin: Secondary | ICD-10-CM | POA: Diagnosis not present

## 2016-01-22 DIAGNOSIS — N186 End stage renal disease: Secondary | ICD-10-CM | POA: Diagnosis not present

## 2016-01-22 DIAGNOSIS — D631 Anemia in chronic kidney disease: Secondary | ICD-10-CM | POA: Diagnosis not present

## 2016-01-24 DIAGNOSIS — N2581 Secondary hyperparathyroidism of renal origin: Secondary | ICD-10-CM | POA: Diagnosis not present

## 2016-01-24 DIAGNOSIS — D631 Anemia in chronic kidney disease: Secondary | ICD-10-CM | POA: Diagnosis not present

## 2016-01-24 DIAGNOSIS — N186 End stage renal disease: Secondary | ICD-10-CM | POA: Diagnosis not present

## 2016-01-26 DIAGNOSIS — D631 Anemia in chronic kidney disease: Secondary | ICD-10-CM | POA: Diagnosis not present

## 2016-01-26 DIAGNOSIS — N2581 Secondary hyperparathyroidism of renal origin: Secondary | ICD-10-CM | POA: Diagnosis not present

## 2016-01-26 DIAGNOSIS — N186 End stage renal disease: Secondary | ICD-10-CM | POA: Diagnosis not present

## 2016-01-29 DIAGNOSIS — N2581 Secondary hyperparathyroidism of renal origin: Secondary | ICD-10-CM | POA: Diagnosis not present

## 2016-01-29 DIAGNOSIS — D631 Anemia in chronic kidney disease: Secondary | ICD-10-CM | POA: Diagnosis not present

## 2016-01-29 DIAGNOSIS — N186 End stage renal disease: Secondary | ICD-10-CM | POA: Diagnosis not present

## 2016-01-31 DIAGNOSIS — D631 Anemia in chronic kidney disease: Secondary | ICD-10-CM | POA: Diagnosis not present

## 2016-01-31 DIAGNOSIS — N2581 Secondary hyperparathyroidism of renal origin: Secondary | ICD-10-CM | POA: Diagnosis not present

## 2016-01-31 DIAGNOSIS — N186 End stage renal disease: Secondary | ICD-10-CM | POA: Diagnosis not present

## 2016-02-02 DIAGNOSIS — N186 End stage renal disease: Secondary | ICD-10-CM | POA: Diagnosis not present

## 2016-02-02 DIAGNOSIS — D631 Anemia in chronic kidney disease: Secondary | ICD-10-CM | POA: Diagnosis not present

## 2016-02-02 DIAGNOSIS — N2581 Secondary hyperparathyroidism of renal origin: Secondary | ICD-10-CM | POA: Diagnosis not present

## 2016-02-05 DIAGNOSIS — N2581 Secondary hyperparathyroidism of renal origin: Secondary | ICD-10-CM | POA: Diagnosis not present

## 2016-02-05 DIAGNOSIS — D631 Anemia in chronic kidney disease: Secondary | ICD-10-CM | POA: Diagnosis not present

## 2016-02-05 DIAGNOSIS — N186 End stage renal disease: Secondary | ICD-10-CM | POA: Diagnosis not present

## 2016-02-07 DIAGNOSIS — N186 End stage renal disease: Secondary | ICD-10-CM | POA: Diagnosis not present

## 2016-02-07 DIAGNOSIS — N2581 Secondary hyperparathyroidism of renal origin: Secondary | ICD-10-CM | POA: Diagnosis not present

## 2016-02-07 DIAGNOSIS — D631 Anemia in chronic kidney disease: Secondary | ICD-10-CM | POA: Diagnosis not present

## 2016-02-09 DIAGNOSIS — N186 End stage renal disease: Secondary | ICD-10-CM | POA: Diagnosis not present

## 2016-02-09 DIAGNOSIS — N2581 Secondary hyperparathyroidism of renal origin: Secondary | ICD-10-CM | POA: Diagnosis not present

## 2016-02-09 DIAGNOSIS — Z992 Dependence on renal dialysis: Secondary | ICD-10-CM | POA: Diagnosis not present

## 2016-02-09 DIAGNOSIS — B20 Human immunodeficiency virus [HIV] disease: Secondary | ICD-10-CM | POA: Diagnosis not present

## 2016-02-09 DIAGNOSIS — D631 Anemia in chronic kidney disease: Secondary | ICD-10-CM | POA: Diagnosis not present

## 2016-02-13 DIAGNOSIS — D631 Anemia in chronic kidney disease: Secondary | ICD-10-CM | POA: Diagnosis not present

## 2016-02-13 DIAGNOSIS — N2581 Secondary hyperparathyroidism of renal origin: Secondary | ICD-10-CM | POA: Diagnosis not present

## 2016-02-13 DIAGNOSIS — N186 End stage renal disease: Secondary | ICD-10-CM | POA: Diagnosis not present

## 2016-02-14 DIAGNOSIS — D631 Anemia in chronic kidney disease: Secondary | ICD-10-CM | POA: Diagnosis not present

## 2016-02-14 DIAGNOSIS — N186 End stage renal disease: Secondary | ICD-10-CM | POA: Diagnosis not present

## 2016-02-14 DIAGNOSIS — N2581 Secondary hyperparathyroidism of renal origin: Secondary | ICD-10-CM | POA: Diagnosis not present

## 2016-02-16 DIAGNOSIS — N186 End stage renal disease: Secondary | ICD-10-CM | POA: Diagnosis not present

## 2016-02-16 DIAGNOSIS — N2581 Secondary hyperparathyroidism of renal origin: Secondary | ICD-10-CM | POA: Diagnosis not present

## 2016-02-16 DIAGNOSIS — D631 Anemia in chronic kidney disease: Secondary | ICD-10-CM | POA: Diagnosis not present

## 2016-02-19 DIAGNOSIS — N186 End stage renal disease: Secondary | ICD-10-CM | POA: Diagnosis not present

## 2016-02-19 DIAGNOSIS — D631 Anemia in chronic kidney disease: Secondary | ICD-10-CM | POA: Diagnosis not present

## 2016-02-19 DIAGNOSIS — N2581 Secondary hyperparathyroidism of renal origin: Secondary | ICD-10-CM | POA: Diagnosis not present

## 2016-02-21 DIAGNOSIS — D631 Anemia in chronic kidney disease: Secondary | ICD-10-CM | POA: Diagnosis not present

## 2016-02-21 DIAGNOSIS — N186 End stage renal disease: Secondary | ICD-10-CM | POA: Diagnosis not present

## 2016-02-21 DIAGNOSIS — N2581 Secondary hyperparathyroidism of renal origin: Secondary | ICD-10-CM | POA: Diagnosis not present

## 2016-02-23 DIAGNOSIS — N2581 Secondary hyperparathyroidism of renal origin: Secondary | ICD-10-CM | POA: Diagnosis not present

## 2016-02-23 DIAGNOSIS — N186 End stage renal disease: Secondary | ICD-10-CM | POA: Diagnosis not present

## 2016-02-23 DIAGNOSIS — D631 Anemia in chronic kidney disease: Secondary | ICD-10-CM | POA: Diagnosis not present

## 2016-02-28 ENCOUNTER — Other Ambulatory Visit: Payer: Self-pay | Admitting: Infectious Disease

## 2016-02-28 DIAGNOSIS — D631 Anemia in chronic kidney disease: Secondary | ICD-10-CM | POA: Diagnosis not present

## 2016-02-28 DIAGNOSIS — N2581 Secondary hyperparathyroidism of renal origin: Secondary | ICD-10-CM | POA: Diagnosis not present

## 2016-02-28 DIAGNOSIS — N186 End stage renal disease: Secondary | ICD-10-CM | POA: Diagnosis not present

## 2016-02-29 ENCOUNTER — Encounter (HOSPITAL_BASED_OUTPATIENT_CLINIC_OR_DEPARTMENT_OTHER): Payer: Self-pay | Admitting: *Deleted

## 2016-02-29 ENCOUNTER — Emergency Department (HOSPITAL_BASED_OUTPATIENT_CLINIC_OR_DEPARTMENT_OTHER)
Admission: EM | Admit: 2016-02-29 | Discharge: 2016-02-29 | Disposition: A | Discharge status: Home / Self Care | Payer: Medicare Other | Attending: Emergency Medicine | Admitting: Emergency Medicine

## 2016-02-29 ENCOUNTER — Emergency Department (HOSPITAL_BASED_OUTPATIENT_CLINIC_OR_DEPARTMENT_OTHER): Payer: Medicare Other

## 2016-02-29 DIAGNOSIS — Z992 Dependence on renal dialysis: Secondary | ICD-10-CM | POA: Diagnosis not present

## 2016-02-29 DIAGNOSIS — S99922A Unspecified injury of left foot, initial encounter: Secondary | ICD-10-CM | POA: Diagnosis present

## 2016-02-29 DIAGNOSIS — M79672 Pain in left foot: Secondary | ICD-10-CM | POA: Diagnosis not present

## 2016-02-29 DIAGNOSIS — I10 Essential (primary) hypertension: Secondary | ICD-10-CM | POA: Diagnosis not present

## 2016-02-29 DIAGNOSIS — R011 Cardiac murmur, unspecified: Secondary | ICD-10-CM | POA: Diagnosis not present

## 2016-02-29 DIAGNOSIS — Y9389 Activity, other specified: Secondary | ICD-10-CM | POA: Insufficient documentation

## 2016-02-29 DIAGNOSIS — Z79899 Other long term (current) drug therapy: Secondary | ICD-10-CM | POA: Diagnosis not present

## 2016-02-29 DIAGNOSIS — Z87448 Personal history of other diseases of urinary system: Secondary | ICD-10-CM | POA: Diagnosis not present

## 2016-02-29 DIAGNOSIS — Y9289 Other specified places as the place of occurrence of the external cause: Secondary | ICD-10-CM | POA: Diagnosis not present

## 2016-02-29 DIAGNOSIS — S9032XA Contusion of left foot, initial encounter: Secondary | ICD-10-CM | POA: Insufficient documentation

## 2016-02-29 DIAGNOSIS — W208XXA Other cause of strike by thrown, projected or falling object, initial encounter: Secondary | ICD-10-CM | POA: Insufficient documentation

## 2016-02-29 DIAGNOSIS — Z9119 Patient's noncompliance with other medical treatment and regimen: Secondary | ICD-10-CM | POA: Diagnosis not present

## 2016-02-29 DIAGNOSIS — Z862 Personal history of diseases of the blood and blood-forming organs and certain disorders involving the immune mechanism: Secondary | ICD-10-CM | POA: Diagnosis not present

## 2016-02-29 DIAGNOSIS — Y998 Other external cause status: Secondary | ICD-10-CM | POA: Insufficient documentation

## 2016-02-29 NOTE — Discharge Instructions (Signed)
You have been seen today for a foot injury. Your imaging showed no abnormalities. Follow up with PCP as needed should symptoms continue. Return to ED should symptoms worsen. Wear the postop shoe and use the crutches for comfort. Elevate the extremity whenever possible. Apply ice for no more than 15 minutes at a time. Take 800 mg of ibuprofen every 8 hours for pain and inflammation. Take this with food to avoid upset stomach.

## 2016-02-29 NOTE — ED Provider Notes (Signed)
CSN: [id number]     Arrival date & time 02/29/16  1149 History   First MD Initiated Contact with Patient 02/29/16 1318     Chief Complaint  Patient presents with  . Foot Injury     (Consider location/radiation/quality/duration/timing/severity/associated sxs/prior Treatment) HPI   Dylan Barber. is a 40 y.o. male, with a history of HIV, dialysis, and hypertension, presenting to the ED with Left foot injury that occurred just prior to arrival. Patient states that a 50 pound weight fell on his left foot today. Patient complains of pain with ambulation. Denies pain at rest. States that he took thousand milligrams of ibuprofen prior to arrival, with some relief. While ambulating, patient endorses 8 out of 10 pain, sharp, nonradiating. Patient denies neuro deficits or any other complaints or injuries. Patient's last check of his HIV was in March 2017, his viral load was undetectable, and his CD4 count was 700.   Past Medical History  Diagnosis Date  . Hypertension   . Dialysis patient ([place])   . Renal disorder   . HIV disease ([place])   . Anemia   . Seizure ([place])   . Heart murmur   . Lymphocytosis 06/16/2014  . Noncompliance 03/06/2015   Past Surgical History  Procedure Laterality Date  . Av fistula placement Left 05/07/2013    Procedure: ARTERIOVENOUS (AV) FISTULA CREATION- LEFT;  Surgeon: Rosetta Posner, MD;  Location: [place];  Service: Vascular;  Laterality: Left;  . Insertion of dialysis catheter      x 2  . Av fistula placement Left 08/12/2013    Procedure: ARTERIOVENOUS (AV) FISTULA CREATION BRACHIOCEPHALIC;  Surgeon: Angelia Mould, MD;  Location: [place];  Service: Vascular;  Laterality: Left;  . Ligation of arteriovenous  fistula Left 08/12/2013    Procedure: LIGATION OF ARTERIOVENOUS  FISTULA RADIOCEPHALIC;  Surgeon: Angelia Mould, MD;  Location: [facility] OR;  Service: Vascular;  Laterality: Left;   Family History  Problem Relation Age of Onset  . Kidney failure Mother    . Kidney disease Mother   . Kidney failure Maternal Uncle   . Kidney disease Paternal Uncle   . Kidney disease Maternal Grandfather    Social History  Substance Use Topics  . Smoking status: Never Smoker   . Smokeless tobacco: Never Used  . Alcohol Use: No    Review of Systems  Musculoskeletal: Positive for arthralgias (left foot).  Skin: Negative for pallor.  Neurological: Negative for weakness and numbness.      Allergies  Codeine; Eggs or egg-derived products; Heparin; and Mercury  Home Medications   Prior to Admission medications   Medication Sig Start Date End Date Taking? Authorizing Provider  abacavir (ZIAGEN) 300 MG tablet Take 2 tablets (600 mg total) by mouth daily. 01/11/16   Thayer Headings, MD  carvedilol (COREG) 6.25 MG tablet Take 1 tablet (6.25 mg total) by mouth 2 (two) times daily. Patient not taking: Reported on 01/18/2016 02/07/15   Jettie Booze, MD  dolutegravir (TIVICAY) 50 MG tablet Take 1 tablet (50 mg total) by mouth daily. 01/11/16   Thayer Headings, MD  ibuprofen (ADVIL,MOTRIN) 200 MG tablet Take 800 mg by mouth every 6 (six) hours as needed for headache. Reported on 01/18/2016    Historical Provider, MD  lamivudine (EPIVIR HBV) 100 MG tablet Take 0.5 tablets (50 mg total) by mouth daily. 01/11/16   Thayer Headings, MD  rilpivirine (EDURANT) 25 MG TABS tablet Take 1 tablet (25 mg total)  by mouth daily with breakfast. 01/11/16   Thayer Headings, MD  valACYclovir (VALTREX) 500 MG tablet Take 500 mg by mouth daily. Reported on 01/18/2016    Historical Provider, MD   BP 122/76 mmHg  Pulse 82  Temp(Src) 98.2 F (36.8 C) (Oral)  Resp 18  Ht 5\' 7"  (1.702 m)  Wt 91.627 kg  BMI 31.63 kg/m2  SpO2 100% Physical Exam  Constitutional: He appears well-developed and well-nourished. No distress.  HENT:  Head: Normocephalic and atraumatic.  Eyes: Conjunctivae are normal.  Cardiovascular: Normal rate, regular rhythm and intact distal pulses.   Pulmonary/Chest: Effort  normal.  Musculoskeletal:  Swelling, tenderness, and bruising to the dorsum of the left foot. Range of motion intact.  Neurological: He is alert.  No sensory deficits distal to the injury. Strength 5 out of 5.  Skin: Skin is warm and dry. He is not diaphoretic.  Nursing note and vitals reviewed.   ED Course  Procedures (including critical care time)  Imaging Review Dg Foot Complete Left  02/29/2016  CLINICAL DATA:  Acute left foot pain after weight fell on it today. Initial encounter. EXAM: LEFT FOOT - COMPLETE 3+ VIEW COMPARISON:  None. FINDINGS: There is no evidence of fracture or dislocation. There is no evidence of arthropathy or other focal bone abnormality. Soft tissues are unremarkable. IMPRESSION: Normal left foot. Electronically Signed   By: Marijo Conception, M.D.   On: 02/29/2016 12:22   I have personally reviewed and evaluated these images as part of my medical decision-making.   EKG Interpretation None      MDM   Final diagnoses:  Foot injury, left, initial encounter    Dylan Barber Dylan Barber. presents with left foot pain from an injury that occurred just prior to arrival.   No sign of fracture or dislocation on x-ray. Patient placed in postop shoe and given crutches. Patient states that he is allowed to take ibuprofen, even with his renal dysfunction. Placed in postop shoe and given crutches. Patient was advised to follow up with his PCP and nephrologist and get their opinion on pain management. Further home care and return precautions discussed. Patient voiced understanding of these instructions and is comfortable with discharge.   Filed Vitals:   02/29/16 1158 02/29/16 1343  BP: 121/76 122/76  Pulse: 99 82  Temp: 98.7 F (37.1 C) 98.2 F (36.8 C)  TempSrc: Oral Oral  Resp: 18 18  Height: 5\' 7"  (1.702 m)   Weight: 91.627 kg   SpO2: 100% 100%      Lorayne Bender, PA-C 02/29/16 [place], MD 02/29/16 1604

## 2016-02-29 NOTE — ED Notes (Signed)
Shawn, PA at bedside.

## 2016-02-29 NOTE — ED Notes (Signed)
50 pound weight fell on his left foot today.

## 2016-03-01 DIAGNOSIS — N186 End stage renal disease: Secondary | ICD-10-CM | POA: Diagnosis not present

## 2016-03-01 DIAGNOSIS — D631 Anemia in chronic kidney disease: Secondary | ICD-10-CM | POA: Diagnosis not present

## 2016-03-01 DIAGNOSIS — N2581 Secondary hyperparathyroidism of renal origin: Secondary | ICD-10-CM | POA: Diagnosis not present

## 2016-03-04 DIAGNOSIS — N2581 Secondary hyperparathyroidism of renal origin: Secondary | ICD-10-CM | POA: Diagnosis not present

## 2016-03-04 DIAGNOSIS — D631 Anemia in chronic kidney disease: Secondary | ICD-10-CM | POA: Diagnosis not present

## 2016-03-04 DIAGNOSIS — N186 End stage renal disease: Secondary | ICD-10-CM | POA: Diagnosis not present

## 2016-03-06 DIAGNOSIS — N186 End stage renal disease: Secondary | ICD-10-CM | POA: Diagnosis not present

## 2016-03-06 DIAGNOSIS — N2581 Secondary hyperparathyroidism of renal origin: Secondary | ICD-10-CM | POA: Diagnosis not present

## 2016-03-06 DIAGNOSIS — D631 Anemia in chronic kidney disease: Secondary | ICD-10-CM | POA: Diagnosis not present

## 2016-03-08 DIAGNOSIS — D631 Anemia in chronic kidney disease: Secondary | ICD-10-CM | POA: Diagnosis not present

## 2016-03-08 DIAGNOSIS — N2581 Secondary hyperparathyroidism of renal origin: Secondary | ICD-10-CM | POA: Diagnosis not present

## 2016-03-08 DIAGNOSIS — N186 End stage renal disease: Secondary | ICD-10-CM | POA: Diagnosis not present

## 2016-03-10 DIAGNOSIS — Z992 Dependence on renal dialysis: Secondary | ICD-10-CM | POA: Diagnosis not present

## 2016-03-10 DIAGNOSIS — N186 End stage renal disease: Secondary | ICD-10-CM | POA: Diagnosis not present

## 2016-03-10 DIAGNOSIS — B20 Human immunodeficiency virus [HIV] disease: Secondary | ICD-10-CM | POA: Diagnosis not present

## 2016-03-11 DIAGNOSIS — N186 End stage renal disease: Secondary | ICD-10-CM | POA: Diagnosis not present

## 2016-03-11 DIAGNOSIS — D631 Anemia in chronic kidney disease: Secondary | ICD-10-CM | POA: Diagnosis not present

## 2016-03-11 DIAGNOSIS — N2581 Secondary hyperparathyroidism of renal origin: Secondary | ICD-10-CM | POA: Diagnosis not present

## 2016-03-13 DIAGNOSIS — N2581 Secondary hyperparathyroidism of renal origin: Secondary | ICD-10-CM | POA: Diagnosis not present

## 2016-03-13 DIAGNOSIS — N186 End stage renal disease: Secondary | ICD-10-CM | POA: Diagnosis not present

## 2016-03-13 DIAGNOSIS — D631 Anemia in chronic kidney disease: Secondary | ICD-10-CM | POA: Diagnosis not present

## 2016-03-15 DIAGNOSIS — D631 Anemia in chronic kidney disease: Secondary | ICD-10-CM | POA: Diagnosis not present

## 2016-03-15 DIAGNOSIS — N186 End stage renal disease: Secondary | ICD-10-CM | POA: Diagnosis not present

## 2016-03-15 DIAGNOSIS — N2581 Secondary hyperparathyroidism of renal origin: Secondary | ICD-10-CM | POA: Diagnosis not present

## 2016-03-18 DIAGNOSIS — D631 Anemia in chronic kidney disease: Secondary | ICD-10-CM | POA: Diagnosis not present

## 2016-03-18 DIAGNOSIS — N2581 Secondary hyperparathyroidism of renal origin: Secondary | ICD-10-CM | POA: Diagnosis not present

## 2016-03-18 DIAGNOSIS — N186 End stage renal disease: Secondary | ICD-10-CM | POA: Diagnosis not present

## 2016-03-20 DIAGNOSIS — N186 End stage renal disease: Secondary | ICD-10-CM | POA: Diagnosis not present

## 2016-03-20 DIAGNOSIS — D631 Anemia in chronic kidney disease: Secondary | ICD-10-CM | POA: Diagnosis not present

## 2016-03-20 DIAGNOSIS — N2581 Secondary hyperparathyroidism of renal origin: Secondary | ICD-10-CM | POA: Diagnosis not present

## 2016-03-22 DIAGNOSIS — D631 Anemia in chronic kidney disease: Secondary | ICD-10-CM | POA: Diagnosis not present

## 2016-03-22 DIAGNOSIS — N2581 Secondary hyperparathyroidism of renal origin: Secondary | ICD-10-CM | POA: Diagnosis not present

## 2016-03-22 DIAGNOSIS — N186 End stage renal disease: Secondary | ICD-10-CM | POA: Diagnosis not present

## 2016-03-25 ENCOUNTER — Other Ambulatory Visit: Payer: Self-pay | Admitting: Infectious Disease

## 2016-03-25 DIAGNOSIS — D631 Anemia in chronic kidney disease: Secondary | ICD-10-CM | POA: Diagnosis not present

## 2016-03-25 DIAGNOSIS — N186 End stage renal disease: Secondary | ICD-10-CM | POA: Diagnosis not present

## 2016-03-25 DIAGNOSIS — N2581 Secondary hyperparathyroidism of renal origin: Secondary | ICD-10-CM | POA: Diagnosis not present

## 2016-03-27 DIAGNOSIS — N2581 Secondary hyperparathyroidism of renal origin: Secondary | ICD-10-CM | POA: Diagnosis not present

## 2016-03-27 DIAGNOSIS — N186 End stage renal disease: Secondary | ICD-10-CM | POA: Diagnosis not present

## 2016-03-27 DIAGNOSIS — D631 Anemia in chronic kidney disease: Secondary | ICD-10-CM | POA: Diagnosis not present

## 2016-03-29 DIAGNOSIS — D631 Anemia in chronic kidney disease: Secondary | ICD-10-CM | POA: Diagnosis not present

## 2016-03-29 DIAGNOSIS — N186 End stage renal disease: Secondary | ICD-10-CM | POA: Diagnosis not present

## 2016-03-29 DIAGNOSIS — N2581 Secondary hyperparathyroidism of renal origin: Secondary | ICD-10-CM | POA: Diagnosis not present

## 2016-04-01 DIAGNOSIS — N2581 Secondary hyperparathyroidism of renal origin: Secondary | ICD-10-CM | POA: Diagnosis not present

## 2016-04-01 DIAGNOSIS — D631 Anemia in chronic kidney disease: Secondary | ICD-10-CM | POA: Diagnosis not present

## 2016-04-01 DIAGNOSIS — N186 End stage renal disease: Secondary | ICD-10-CM | POA: Diagnosis not present

## 2016-04-03 DIAGNOSIS — N2581 Secondary hyperparathyroidism of renal origin: Secondary | ICD-10-CM | POA: Diagnosis not present

## 2016-04-03 DIAGNOSIS — D631 Anemia in chronic kidney disease: Secondary | ICD-10-CM | POA: Diagnosis not present

## 2016-04-03 DIAGNOSIS — N186 End stage renal disease: Secondary | ICD-10-CM | POA: Diagnosis not present

## 2016-04-05 DIAGNOSIS — D631 Anemia in chronic kidney disease: Secondary | ICD-10-CM | POA: Diagnosis not present

## 2016-04-05 DIAGNOSIS — N186 End stage renal disease: Secondary | ICD-10-CM | POA: Diagnosis not present

## 2016-04-05 DIAGNOSIS — N2581 Secondary hyperparathyroidism of renal origin: Secondary | ICD-10-CM | POA: Diagnosis not present

## 2016-04-10 DIAGNOSIS — D631 Anemia in chronic kidney disease: Secondary | ICD-10-CM | POA: Diagnosis not present

## 2016-04-10 DIAGNOSIS — Z992 Dependence on renal dialysis: Secondary | ICD-10-CM | POA: Diagnosis not present

## 2016-04-10 DIAGNOSIS — N186 End stage renal disease: Secondary | ICD-10-CM | POA: Diagnosis not present

## 2016-04-10 DIAGNOSIS — N2581 Secondary hyperparathyroidism of renal origin: Secondary | ICD-10-CM | POA: Diagnosis not present

## 2016-04-10 DIAGNOSIS — B20 Human immunodeficiency virus [HIV] disease: Secondary | ICD-10-CM | POA: Diagnosis not present

## 2016-04-12 DIAGNOSIS — N2581 Secondary hyperparathyroidism of renal origin: Secondary | ICD-10-CM | POA: Diagnosis not present

## 2016-04-12 DIAGNOSIS — D631 Anemia in chronic kidney disease: Secondary | ICD-10-CM | POA: Diagnosis not present

## 2016-04-12 DIAGNOSIS — N186 End stage renal disease: Secondary | ICD-10-CM | POA: Diagnosis not present

## 2016-04-15 DIAGNOSIS — D631 Anemia in chronic kidney disease: Secondary | ICD-10-CM | POA: Diagnosis not present

## 2016-04-15 DIAGNOSIS — N186 End stage renal disease: Secondary | ICD-10-CM | POA: Diagnosis not present

## 2016-04-15 DIAGNOSIS — N2581 Secondary hyperparathyroidism of renal origin: Secondary | ICD-10-CM | POA: Diagnosis not present

## 2016-04-17 DIAGNOSIS — N186 End stage renal disease: Secondary | ICD-10-CM | POA: Diagnosis not present

## 2016-04-17 DIAGNOSIS — N2581 Secondary hyperparathyroidism of renal origin: Secondary | ICD-10-CM | POA: Diagnosis not present

## 2016-04-17 DIAGNOSIS — D631 Anemia in chronic kidney disease: Secondary | ICD-10-CM | POA: Diagnosis not present

## 2016-04-19 DIAGNOSIS — N186 End stage renal disease: Secondary | ICD-10-CM | POA: Diagnosis not present

## 2016-04-19 DIAGNOSIS — N2581 Secondary hyperparathyroidism of renal origin: Secondary | ICD-10-CM | POA: Diagnosis not present

## 2016-04-19 DIAGNOSIS — D631 Anemia in chronic kidney disease: Secondary | ICD-10-CM | POA: Diagnosis not present

## 2016-04-22 DIAGNOSIS — N186 End stage renal disease: Secondary | ICD-10-CM | POA: Diagnosis not present

## 2016-04-22 DIAGNOSIS — D631 Anemia in chronic kidney disease: Secondary | ICD-10-CM | POA: Diagnosis not present

## 2016-04-22 DIAGNOSIS — N2581 Secondary hyperparathyroidism of renal origin: Secondary | ICD-10-CM | POA: Diagnosis not present

## 2016-04-24 DIAGNOSIS — D631 Anemia in chronic kidney disease: Secondary | ICD-10-CM | POA: Diagnosis not present

## 2016-04-24 DIAGNOSIS — N2581 Secondary hyperparathyroidism of renal origin: Secondary | ICD-10-CM | POA: Diagnosis not present

## 2016-04-24 DIAGNOSIS — N186 End stage renal disease: Secondary | ICD-10-CM | POA: Diagnosis not present

## 2016-04-26 DIAGNOSIS — D631 Anemia in chronic kidney disease: Secondary | ICD-10-CM | POA: Diagnosis not present

## 2016-04-26 DIAGNOSIS — N2581 Secondary hyperparathyroidism of renal origin: Secondary | ICD-10-CM | POA: Diagnosis not present

## 2016-04-26 DIAGNOSIS — N186 End stage renal disease: Secondary | ICD-10-CM | POA: Diagnosis not present

## 2016-04-29 DIAGNOSIS — D631 Anemia in chronic kidney disease: Secondary | ICD-10-CM | POA: Diagnosis not present

## 2016-04-29 DIAGNOSIS — N186 End stage renal disease: Secondary | ICD-10-CM | POA: Diagnosis not present

## 2016-04-29 DIAGNOSIS — N2581 Secondary hyperparathyroidism of renal origin: Secondary | ICD-10-CM | POA: Diagnosis not present

## 2016-05-01 DIAGNOSIS — D631 Anemia in chronic kidney disease: Secondary | ICD-10-CM | POA: Diagnosis not present

## 2016-05-01 DIAGNOSIS — N2581 Secondary hyperparathyroidism of renal origin: Secondary | ICD-10-CM | POA: Diagnosis not present

## 2016-05-01 DIAGNOSIS — N186 End stage renal disease: Secondary | ICD-10-CM | POA: Diagnosis not present

## 2016-05-03 DIAGNOSIS — N2581 Secondary hyperparathyroidism of renal origin: Secondary | ICD-10-CM | POA: Diagnosis not present

## 2016-05-03 DIAGNOSIS — D631 Anemia in chronic kidney disease: Secondary | ICD-10-CM | POA: Diagnosis not present

## 2016-05-03 DIAGNOSIS — N186 End stage renal disease: Secondary | ICD-10-CM | POA: Diagnosis not present

## 2016-05-06 DIAGNOSIS — N186 End stage renal disease: Secondary | ICD-10-CM | POA: Diagnosis not present

## 2016-05-06 DIAGNOSIS — D631 Anemia in chronic kidney disease: Secondary | ICD-10-CM | POA: Diagnosis not present

## 2016-05-06 DIAGNOSIS — N2581 Secondary hyperparathyroidism of renal origin: Secondary | ICD-10-CM | POA: Diagnosis not present

## 2016-05-08 DIAGNOSIS — N2581 Secondary hyperparathyroidism of renal origin: Secondary | ICD-10-CM | POA: Diagnosis not present

## 2016-05-08 DIAGNOSIS — N186 End stage renal disease: Secondary | ICD-10-CM | POA: Diagnosis not present

## 2016-05-08 DIAGNOSIS — D631 Anemia in chronic kidney disease: Secondary | ICD-10-CM | POA: Diagnosis not present

## 2016-05-10 DIAGNOSIS — N2581 Secondary hyperparathyroidism of renal origin: Secondary | ICD-10-CM | POA: Diagnosis not present

## 2016-05-10 DIAGNOSIS — N186 End stage renal disease: Secondary | ICD-10-CM | POA: Diagnosis not present

## 2016-05-10 DIAGNOSIS — B20 Human immunodeficiency virus [HIV] disease: Secondary | ICD-10-CM | POA: Diagnosis not present

## 2016-05-10 DIAGNOSIS — Z992 Dependence on renal dialysis: Secondary | ICD-10-CM | POA: Diagnosis not present

## 2016-05-10 DIAGNOSIS — D631 Anemia in chronic kidney disease: Secondary | ICD-10-CM | POA: Diagnosis not present

## 2016-05-13 DIAGNOSIS — N186 End stage renal disease: Secondary | ICD-10-CM | POA: Diagnosis not present

## 2016-05-13 DIAGNOSIS — N2581 Secondary hyperparathyroidism of renal origin: Secondary | ICD-10-CM | POA: Diagnosis not present

## 2016-05-13 DIAGNOSIS — D631 Anemia in chronic kidney disease: Secondary | ICD-10-CM | POA: Diagnosis not present

## 2016-05-15 DIAGNOSIS — N186 End stage renal disease: Secondary | ICD-10-CM | POA: Diagnosis not present

## 2016-05-15 DIAGNOSIS — D631 Anemia in chronic kidney disease: Secondary | ICD-10-CM | POA: Diagnosis not present

## 2016-05-15 DIAGNOSIS — N2581 Secondary hyperparathyroidism of renal origin: Secondary | ICD-10-CM | POA: Diagnosis not present

## 2016-05-17 DIAGNOSIS — N186 End stage renal disease: Secondary | ICD-10-CM | POA: Diagnosis not present

## 2016-05-17 DIAGNOSIS — D631 Anemia in chronic kidney disease: Secondary | ICD-10-CM | POA: Diagnosis not present

## 2016-05-17 DIAGNOSIS — N2581 Secondary hyperparathyroidism of renal origin: Secondary | ICD-10-CM | POA: Diagnosis not present

## 2016-05-20 DIAGNOSIS — D631 Anemia in chronic kidney disease: Secondary | ICD-10-CM | POA: Diagnosis not present

## 2016-05-20 DIAGNOSIS — N186 End stage renal disease: Secondary | ICD-10-CM | POA: Diagnosis not present

## 2016-05-20 DIAGNOSIS — N2581 Secondary hyperparathyroidism of renal origin: Secondary | ICD-10-CM | POA: Diagnosis not present

## 2016-05-22 DIAGNOSIS — N186 End stage renal disease: Secondary | ICD-10-CM | POA: Diagnosis not present

## 2016-05-22 DIAGNOSIS — N2581 Secondary hyperparathyroidism of renal origin: Secondary | ICD-10-CM | POA: Diagnosis not present

## 2016-05-22 DIAGNOSIS — D631 Anemia in chronic kidney disease: Secondary | ICD-10-CM | POA: Diagnosis not present

## 2016-05-24 DIAGNOSIS — N186 End stage renal disease: Secondary | ICD-10-CM | POA: Diagnosis not present

## 2016-05-24 DIAGNOSIS — D631 Anemia in chronic kidney disease: Secondary | ICD-10-CM | POA: Diagnosis not present

## 2016-05-24 DIAGNOSIS — N2581 Secondary hyperparathyroidism of renal origin: Secondary | ICD-10-CM | POA: Diagnosis not present

## 2016-05-27 DIAGNOSIS — N186 End stage renal disease: Secondary | ICD-10-CM | POA: Diagnosis not present

## 2016-05-27 DIAGNOSIS — N2581 Secondary hyperparathyroidism of renal origin: Secondary | ICD-10-CM | POA: Diagnosis not present

## 2016-05-27 DIAGNOSIS — D631 Anemia in chronic kidney disease: Secondary | ICD-10-CM | POA: Diagnosis not present

## 2016-05-29 DIAGNOSIS — N186 End stage renal disease: Secondary | ICD-10-CM | POA: Diagnosis not present

## 2016-05-29 DIAGNOSIS — N2581 Secondary hyperparathyroidism of renal origin: Secondary | ICD-10-CM | POA: Diagnosis not present

## 2016-05-29 DIAGNOSIS — D631 Anemia in chronic kidney disease: Secondary | ICD-10-CM | POA: Diagnosis not present

## 2016-05-31 DIAGNOSIS — N186 End stage renal disease: Secondary | ICD-10-CM | POA: Diagnosis not present

## 2016-05-31 DIAGNOSIS — D631 Anemia in chronic kidney disease: Secondary | ICD-10-CM | POA: Diagnosis not present

## 2016-05-31 DIAGNOSIS — N2581 Secondary hyperparathyroidism of renal origin: Secondary | ICD-10-CM | POA: Diagnosis not present

## 2016-06-03 DIAGNOSIS — D631 Anemia in chronic kidney disease: Secondary | ICD-10-CM | POA: Diagnosis not present

## 2016-06-03 DIAGNOSIS — N2581 Secondary hyperparathyroidism of renal origin: Secondary | ICD-10-CM | POA: Diagnosis not present

## 2016-06-03 DIAGNOSIS — N186 End stage renal disease: Secondary | ICD-10-CM | POA: Diagnosis not present

## 2016-06-05 DIAGNOSIS — N2581 Secondary hyperparathyroidism of renal origin: Secondary | ICD-10-CM | POA: Diagnosis not present

## 2016-06-05 DIAGNOSIS — D631 Anemia in chronic kidney disease: Secondary | ICD-10-CM | POA: Diagnosis not present

## 2016-06-05 DIAGNOSIS — N186 End stage renal disease: Secondary | ICD-10-CM | POA: Diagnosis not present

## 2016-06-07 DIAGNOSIS — N2581 Secondary hyperparathyroidism of renal origin: Secondary | ICD-10-CM | POA: Diagnosis not present

## 2016-06-07 DIAGNOSIS — N186 End stage renal disease: Secondary | ICD-10-CM | POA: Diagnosis not present

## 2016-06-07 DIAGNOSIS — D631 Anemia in chronic kidney disease: Secondary | ICD-10-CM | POA: Diagnosis not present

## 2016-06-10 DIAGNOSIS — Z992 Dependence on renal dialysis: Secondary | ICD-10-CM | POA: Diagnosis not present

## 2016-06-10 DIAGNOSIS — D631 Anemia in chronic kidney disease: Secondary | ICD-10-CM | POA: Diagnosis not present

## 2016-06-10 DIAGNOSIS — N186 End stage renal disease: Secondary | ICD-10-CM | POA: Diagnosis not present

## 2016-06-10 DIAGNOSIS — N2581 Secondary hyperparathyroidism of renal origin: Secondary | ICD-10-CM | POA: Diagnosis not present

## 2016-06-10 DIAGNOSIS — B20 Human immunodeficiency virus [HIV] disease: Secondary | ICD-10-CM | POA: Diagnosis not present

## 2016-06-12 DIAGNOSIS — N186 End stage renal disease: Secondary | ICD-10-CM | POA: Diagnosis not present

## 2016-06-12 DIAGNOSIS — N2581 Secondary hyperparathyroidism of renal origin: Secondary | ICD-10-CM | POA: Diagnosis not present

## 2016-06-14 DIAGNOSIS — N2581 Secondary hyperparathyroidism of renal origin: Secondary | ICD-10-CM | POA: Diagnosis not present

## 2016-06-14 DIAGNOSIS — N186 End stage renal disease: Secondary | ICD-10-CM | POA: Diagnosis not present

## 2016-06-17 DIAGNOSIS — N2581 Secondary hyperparathyroidism of renal origin: Secondary | ICD-10-CM | POA: Diagnosis not present

## 2016-06-17 DIAGNOSIS — N186 End stage renal disease: Secondary | ICD-10-CM | POA: Diagnosis not present

## 2016-06-19 DIAGNOSIS — N2581 Secondary hyperparathyroidism of renal origin: Secondary | ICD-10-CM | POA: Diagnosis not present

## 2016-06-19 DIAGNOSIS — N186 End stage renal disease: Secondary | ICD-10-CM | POA: Diagnosis not present

## 2016-06-20 DIAGNOSIS — N186 End stage renal disease: Secondary | ICD-10-CM | POA: Diagnosis not present

## 2016-06-20 DIAGNOSIS — N2581 Secondary hyperparathyroidism of renal origin: Secondary | ICD-10-CM | POA: Diagnosis not present

## 2016-06-21 DIAGNOSIS — N186 End stage renal disease: Secondary | ICD-10-CM | POA: Diagnosis not present

## 2016-06-21 DIAGNOSIS — N2581 Secondary hyperparathyroidism of renal origin: Secondary | ICD-10-CM | POA: Diagnosis not present

## 2016-06-24 DIAGNOSIS — N186 End stage renal disease: Secondary | ICD-10-CM | POA: Diagnosis not present

## 2016-06-24 DIAGNOSIS — N2581 Secondary hyperparathyroidism of renal origin: Secondary | ICD-10-CM | POA: Diagnosis not present

## 2016-06-26 DIAGNOSIS — N186 End stage renal disease: Secondary | ICD-10-CM | POA: Diagnosis not present

## 2016-06-26 DIAGNOSIS — N2581 Secondary hyperparathyroidism of renal origin: Secondary | ICD-10-CM | POA: Diagnosis not present

## 2016-06-28 DIAGNOSIS — N186 End stage renal disease: Secondary | ICD-10-CM | POA: Diagnosis not present

## 2016-06-28 DIAGNOSIS — N2581 Secondary hyperparathyroidism of renal origin: Secondary | ICD-10-CM | POA: Diagnosis not present

## 2016-06-28 IMAGING — CR DG CHEST 2V
2 series · 2 of 2 positions shown · non-contrast
Comparison: Chest radiograph performed 03/24/2014, and CTA of the
chest performed 03/27/2014

CLINICAL DATA: Acute onset of productive cough, fever, diarrhea and
shortness of breath. Hypertension. Initial encounter.

EXAM:
CHEST  2 VIEW

[w chest pa]
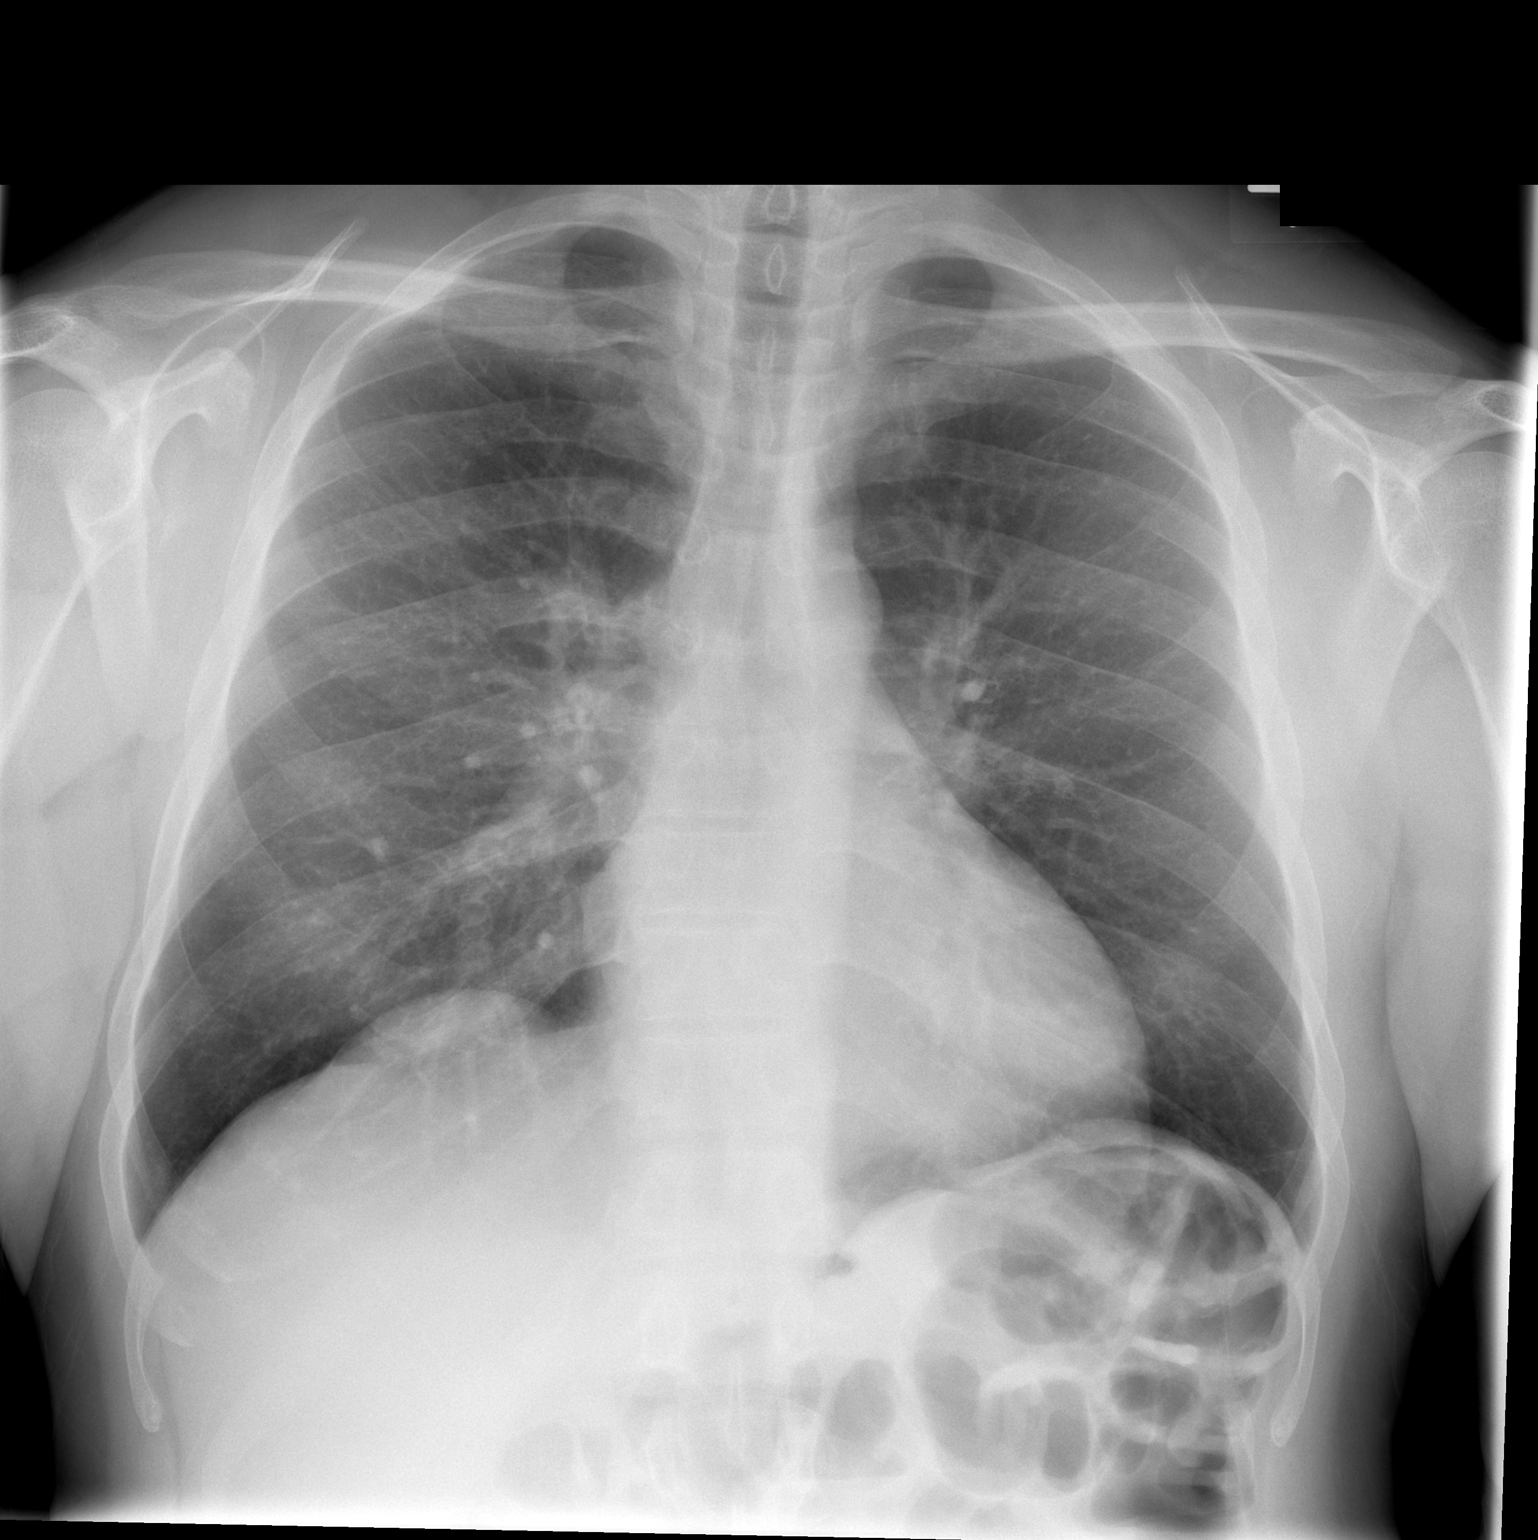

[w chest lat]
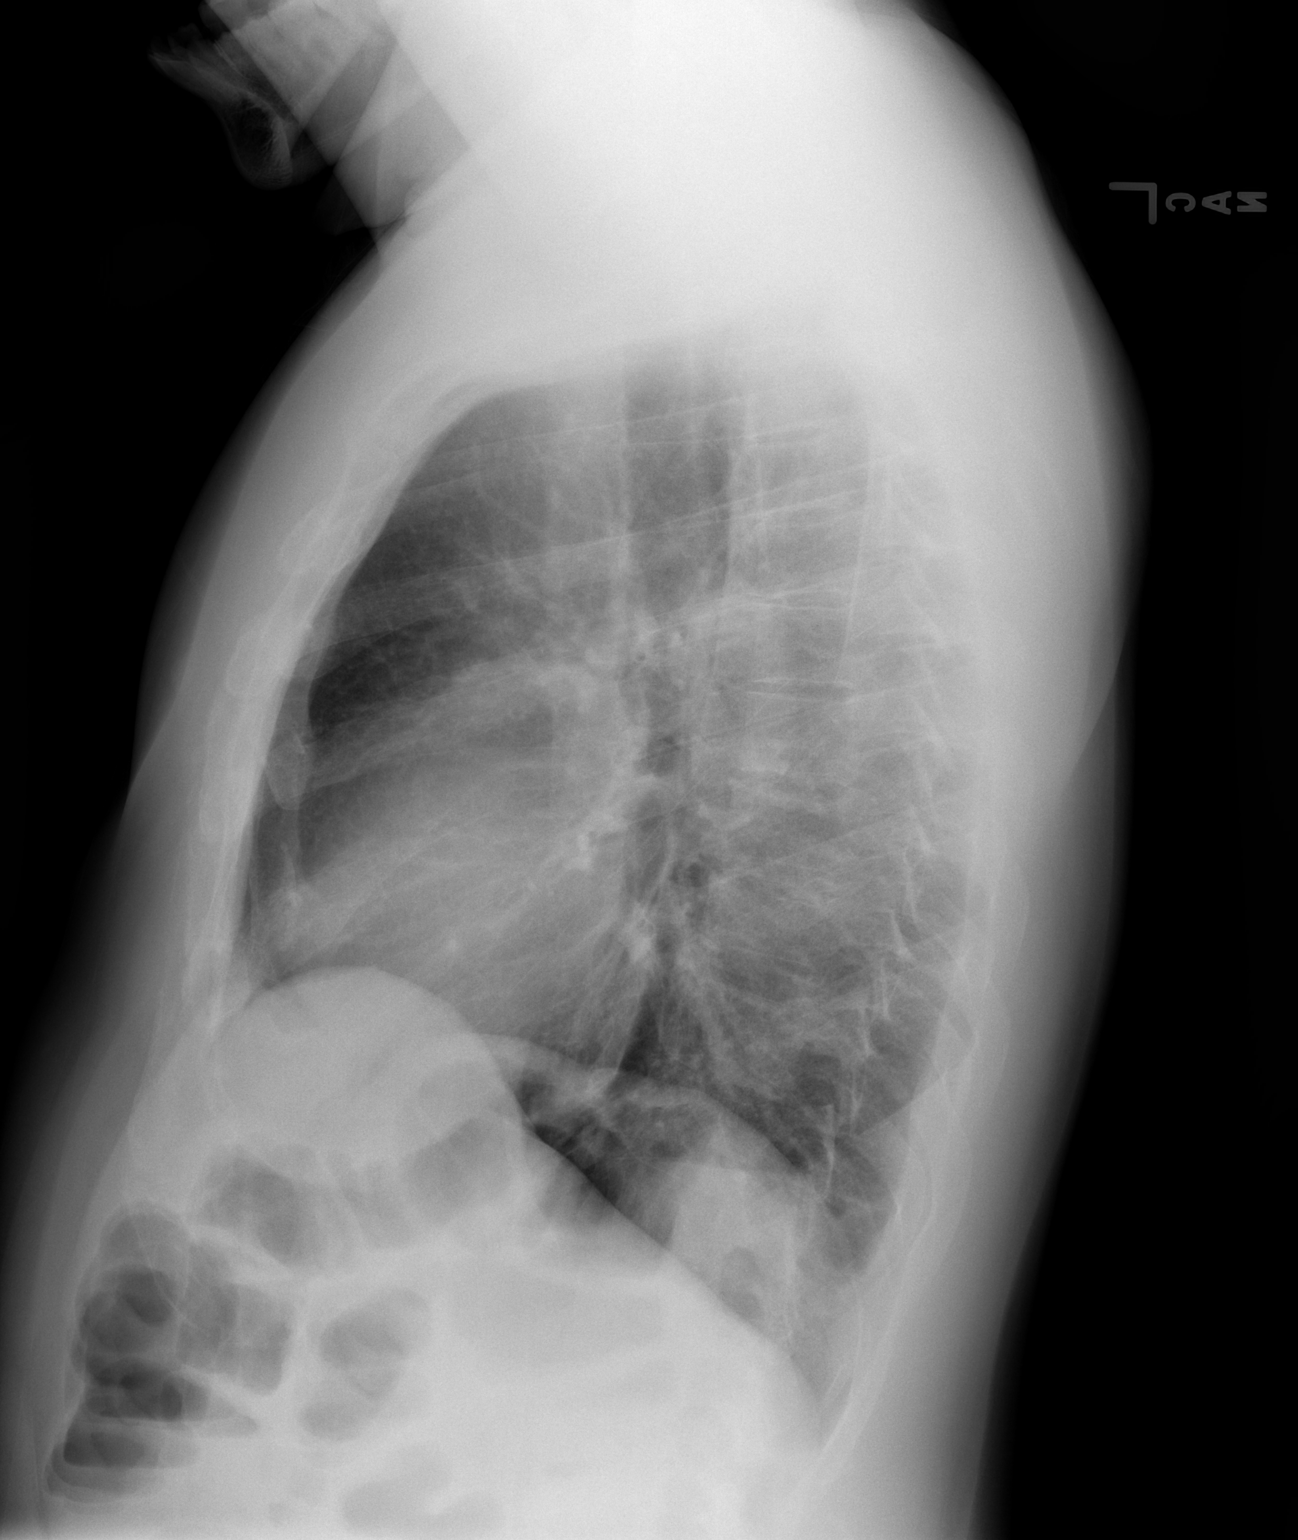

[2 of 2 positions shown; findings below may reference images not displayed]

FINDINGS: The lungs are well-aerated. Minimal right midlung hazy opacity may
reflect recurrent mild interstitial prominence as noted on prior
studies. There is no evidence of pleural effusion or pneumothorax.

The heart is normal in size; the mediastinal contour is within
normal limits. No acute osseous abnormalities are seen.
IMPRESSION: Minimal right mid lung hazy opacity may reflect recurrent mild
interstitial prominence, as noted on prior studies. This is less
prominent than in 8894, and could reflect a mild infectious process
or minimal interstitial edema.

## 2016-07-01 DIAGNOSIS — N2581 Secondary hyperparathyroidism of renal origin: Secondary | ICD-10-CM | POA: Diagnosis not present

## 2016-07-01 DIAGNOSIS — N186 End stage renal disease: Secondary | ICD-10-CM | POA: Diagnosis not present

## 2016-07-03 DIAGNOSIS — N2581 Secondary hyperparathyroidism of renal origin: Secondary | ICD-10-CM | POA: Diagnosis not present

## 2016-07-03 DIAGNOSIS — N186 End stage renal disease: Secondary | ICD-10-CM | POA: Diagnosis not present

## 2016-07-05 DIAGNOSIS — N2581 Secondary hyperparathyroidism of renal origin: Secondary | ICD-10-CM | POA: Diagnosis not present

## 2016-07-05 DIAGNOSIS — N186 End stage renal disease: Secondary | ICD-10-CM | POA: Diagnosis not present

## 2016-07-08 DIAGNOSIS — N186 End stage renal disease: Secondary | ICD-10-CM | POA: Diagnosis not present

## 2016-07-08 DIAGNOSIS — N2581 Secondary hyperparathyroidism of renal origin: Secondary | ICD-10-CM | POA: Diagnosis not present

## 2016-07-10 DIAGNOSIS — N2581 Secondary hyperparathyroidism of renal origin: Secondary | ICD-10-CM | POA: Diagnosis not present

## 2016-07-10 DIAGNOSIS — N186 End stage renal disease: Secondary | ICD-10-CM | POA: Diagnosis not present

## 2016-07-11 DIAGNOSIS — B20 Human immunodeficiency virus [HIV] disease: Secondary | ICD-10-CM | POA: Diagnosis not present

## 2016-07-11 DIAGNOSIS — Z992 Dependence on renal dialysis: Secondary | ICD-10-CM | POA: Diagnosis not present

## 2016-07-11 DIAGNOSIS — N186 End stage renal disease: Secondary | ICD-10-CM | POA: Diagnosis not present

## 2016-07-12 DIAGNOSIS — N186 End stage renal disease: Secondary | ICD-10-CM | POA: Diagnosis not present

## 2016-07-12 DIAGNOSIS — N2581 Secondary hyperparathyroidism of renal origin: Secondary | ICD-10-CM | POA: Diagnosis not present

## 2016-07-15 DIAGNOSIS — N2581 Secondary hyperparathyroidism of renal origin: Secondary | ICD-10-CM | POA: Diagnosis not present

## 2016-07-15 DIAGNOSIS — N186 End stage renal disease: Secondary | ICD-10-CM | POA: Diagnosis not present

## 2016-07-17 DIAGNOSIS — N186 End stage renal disease: Secondary | ICD-10-CM | POA: Diagnosis not present

## 2016-07-17 DIAGNOSIS — N2581 Secondary hyperparathyroidism of renal origin: Secondary | ICD-10-CM | POA: Diagnosis not present

## 2016-07-19 DIAGNOSIS — N2581 Secondary hyperparathyroidism of renal origin: Secondary | ICD-10-CM | POA: Diagnosis not present

## 2016-07-19 DIAGNOSIS — N186 End stage renal disease: Secondary | ICD-10-CM | POA: Diagnosis not present

## 2016-07-22 DIAGNOSIS — N2581 Secondary hyperparathyroidism of renal origin: Secondary | ICD-10-CM | POA: Diagnosis not present

## 2016-07-22 DIAGNOSIS — N186 End stage renal disease: Secondary | ICD-10-CM | POA: Diagnosis not present

## 2016-07-24 DIAGNOSIS — N2581 Secondary hyperparathyroidism of renal origin: Secondary | ICD-10-CM | POA: Diagnosis not present

## 2016-07-24 DIAGNOSIS — N186 End stage renal disease: Secondary | ICD-10-CM | POA: Diagnosis not present

## 2016-07-26 DIAGNOSIS — N186 End stage renal disease: Secondary | ICD-10-CM | POA: Diagnosis not present

## 2016-07-26 DIAGNOSIS — N2581 Secondary hyperparathyroidism of renal origin: Secondary | ICD-10-CM | POA: Diagnosis not present

## 2016-07-29 DIAGNOSIS — N2581 Secondary hyperparathyroidism of renal origin: Secondary | ICD-10-CM | POA: Diagnosis not present

## 2016-07-29 DIAGNOSIS — N186 End stage renal disease: Secondary | ICD-10-CM | POA: Diagnosis not present

## 2016-07-31 DIAGNOSIS — N2581 Secondary hyperparathyroidism of renal origin: Secondary | ICD-10-CM | POA: Diagnosis not present

## 2016-07-31 DIAGNOSIS — N186 End stage renal disease: Secondary | ICD-10-CM | POA: Diagnosis not present

## 2016-08-02 DIAGNOSIS — N2581 Secondary hyperparathyroidism of renal origin: Secondary | ICD-10-CM | POA: Diagnosis not present

## 2016-08-02 DIAGNOSIS — N186 End stage renal disease: Secondary | ICD-10-CM | POA: Diagnosis not present

## 2016-08-05 DIAGNOSIS — N186 End stage renal disease: Secondary | ICD-10-CM | POA: Diagnosis not present

## 2016-08-05 DIAGNOSIS — N2581 Secondary hyperparathyroidism of renal origin: Secondary | ICD-10-CM | POA: Diagnosis not present

## 2016-08-07 DIAGNOSIS — N186 End stage renal disease: Secondary | ICD-10-CM | POA: Diagnosis not present

## 2016-08-07 DIAGNOSIS — N2581 Secondary hyperparathyroidism of renal origin: Secondary | ICD-10-CM | POA: Diagnosis not present

## 2016-08-09 DIAGNOSIS — N2581 Secondary hyperparathyroidism of renal origin: Secondary | ICD-10-CM | POA: Diagnosis not present

## 2016-08-09 DIAGNOSIS — N186 End stage renal disease: Secondary | ICD-10-CM | POA: Diagnosis not present

## 2016-08-10 DIAGNOSIS — Z992 Dependence on renal dialysis: Secondary | ICD-10-CM | POA: Diagnosis not present

## 2016-08-10 DIAGNOSIS — N186 End stage renal disease: Secondary | ICD-10-CM | POA: Diagnosis not present

## 2016-08-10 DIAGNOSIS — B20 Human immunodeficiency virus [HIV] disease: Secondary | ICD-10-CM | POA: Diagnosis not present

## 2016-08-12 DIAGNOSIS — N2581 Secondary hyperparathyroidism of renal origin: Secondary | ICD-10-CM | POA: Diagnosis not present

## 2016-08-12 DIAGNOSIS — N186 End stage renal disease: Secondary | ICD-10-CM | POA: Diagnosis not present

## 2016-08-14 DIAGNOSIS — N186 End stage renal disease: Secondary | ICD-10-CM | POA: Diagnosis not present

## 2016-08-14 DIAGNOSIS — N2581 Secondary hyperparathyroidism of renal origin: Secondary | ICD-10-CM | POA: Diagnosis not present

## 2016-08-16 DIAGNOSIS — N2581 Secondary hyperparathyroidism of renal origin: Secondary | ICD-10-CM | POA: Diagnosis not present

## 2016-08-16 DIAGNOSIS — N186 End stage renal disease: Secondary | ICD-10-CM | POA: Diagnosis not present

## 2016-08-19 DIAGNOSIS — N2581 Secondary hyperparathyroidism of renal origin: Secondary | ICD-10-CM | POA: Diagnosis not present

## 2016-08-19 DIAGNOSIS — N186 End stage renal disease: Secondary | ICD-10-CM | POA: Diagnosis not present

## 2016-08-21 DIAGNOSIS — N186 End stage renal disease: Secondary | ICD-10-CM | POA: Diagnosis not present

## 2016-08-21 DIAGNOSIS — N2581 Secondary hyperparathyroidism of renal origin: Secondary | ICD-10-CM | POA: Diagnosis not present

## 2016-08-23 DIAGNOSIS — N2581 Secondary hyperparathyroidism of renal origin: Secondary | ICD-10-CM | POA: Diagnosis not present

## 2016-08-23 DIAGNOSIS — N186 End stage renal disease: Secondary | ICD-10-CM | POA: Diagnosis not present

## 2016-08-26 DIAGNOSIS — N2581 Secondary hyperparathyroidism of renal origin: Secondary | ICD-10-CM | POA: Diagnosis not present

## 2016-08-26 DIAGNOSIS — N186 End stage renal disease: Secondary | ICD-10-CM | POA: Diagnosis not present

## 2016-08-28 DIAGNOSIS — N186 End stage renal disease: Secondary | ICD-10-CM | POA: Diagnosis not present

## 2016-08-28 DIAGNOSIS — N2581 Secondary hyperparathyroidism of renal origin: Secondary | ICD-10-CM | POA: Diagnosis not present

## 2016-08-30 DIAGNOSIS — N186 End stage renal disease: Secondary | ICD-10-CM | POA: Diagnosis not present

## 2016-08-30 DIAGNOSIS — N2581 Secondary hyperparathyroidism of renal origin: Secondary | ICD-10-CM | POA: Diagnosis not present

## 2016-09-02 DIAGNOSIS — N2581 Secondary hyperparathyroidism of renal origin: Secondary | ICD-10-CM | POA: Diagnosis not present

## 2016-09-02 DIAGNOSIS — N186 End stage renal disease: Secondary | ICD-10-CM | POA: Diagnosis not present

## 2016-09-04 DIAGNOSIS — N186 End stage renal disease: Secondary | ICD-10-CM | POA: Diagnosis not present

## 2016-09-04 DIAGNOSIS — N2581 Secondary hyperparathyroidism of renal origin: Secondary | ICD-10-CM | POA: Diagnosis not present

## 2016-09-06 DIAGNOSIS — N186 End stage renal disease: Secondary | ICD-10-CM | POA: Diagnosis not present

## 2016-09-06 DIAGNOSIS — N2581 Secondary hyperparathyroidism of renal origin: Secondary | ICD-10-CM | POA: Diagnosis not present

## 2016-09-09 DIAGNOSIS — N186 End stage renal disease: Secondary | ICD-10-CM | POA: Diagnosis not present

## 2016-09-09 DIAGNOSIS — N2581 Secondary hyperparathyroidism of renal origin: Secondary | ICD-10-CM | POA: Diagnosis not present

## 2016-09-10 DIAGNOSIS — N186 End stage renal disease: Secondary | ICD-10-CM | POA: Diagnosis not present

## 2016-09-10 DIAGNOSIS — Z992 Dependence on renal dialysis: Secondary | ICD-10-CM | POA: Diagnosis not present

## 2016-09-10 DIAGNOSIS — B20 Human immunodeficiency virus [HIV] disease: Secondary | ICD-10-CM | POA: Diagnosis not present

## 2016-09-11 DIAGNOSIS — N186 End stage renal disease: Secondary | ICD-10-CM | POA: Diagnosis not present

## 2016-09-11 DIAGNOSIS — D631 Anemia in chronic kidney disease: Secondary | ICD-10-CM | POA: Diagnosis not present

## 2016-09-11 DIAGNOSIS — D509 Iron deficiency anemia, unspecified: Secondary | ICD-10-CM | POA: Diagnosis not present

## 2016-09-11 DIAGNOSIS — N2581 Secondary hyperparathyroidism of renal origin: Secondary | ICD-10-CM | POA: Diagnosis not present

## 2016-09-13 DIAGNOSIS — N2581 Secondary hyperparathyroidism of renal origin: Secondary | ICD-10-CM | POA: Diagnosis not present

## 2016-09-13 DIAGNOSIS — D631 Anemia in chronic kidney disease: Secondary | ICD-10-CM | POA: Diagnosis not present

## 2016-09-13 DIAGNOSIS — D509 Iron deficiency anemia, unspecified: Secondary | ICD-10-CM | POA: Diagnosis not present

## 2016-09-13 DIAGNOSIS — N186 End stage renal disease: Secondary | ICD-10-CM | POA: Diagnosis not present

## 2016-09-16 DIAGNOSIS — N186 End stage renal disease: Secondary | ICD-10-CM | POA: Diagnosis not present

## 2016-09-16 DIAGNOSIS — N2581 Secondary hyperparathyroidism of renal origin: Secondary | ICD-10-CM | POA: Diagnosis not present

## 2016-09-16 DIAGNOSIS — D631 Anemia in chronic kidney disease: Secondary | ICD-10-CM | POA: Diagnosis not present

## 2016-09-16 DIAGNOSIS — D509 Iron deficiency anemia, unspecified: Secondary | ICD-10-CM | POA: Diagnosis not present

## 2016-09-18 DIAGNOSIS — N2581 Secondary hyperparathyroidism of renal origin: Secondary | ICD-10-CM | POA: Diagnosis not present

## 2016-09-18 DIAGNOSIS — N186 End stage renal disease: Secondary | ICD-10-CM | POA: Diagnosis not present

## 2016-09-18 DIAGNOSIS — D631 Anemia in chronic kidney disease: Secondary | ICD-10-CM | POA: Diagnosis not present

## 2016-09-18 DIAGNOSIS — D509 Iron deficiency anemia, unspecified: Secondary | ICD-10-CM | POA: Diagnosis not present

## 2016-09-20 DIAGNOSIS — N186 End stage renal disease: Secondary | ICD-10-CM | POA: Diagnosis not present

## 2016-09-20 DIAGNOSIS — D631 Anemia in chronic kidney disease: Secondary | ICD-10-CM | POA: Diagnosis not present

## 2016-09-20 DIAGNOSIS — D509 Iron deficiency anemia, unspecified: Secondary | ICD-10-CM | POA: Diagnosis not present

## 2016-09-20 DIAGNOSIS — N2581 Secondary hyperparathyroidism of renal origin: Secondary | ICD-10-CM | POA: Diagnosis not present

## 2016-09-23 DIAGNOSIS — N2581 Secondary hyperparathyroidism of renal origin: Secondary | ICD-10-CM | POA: Diagnosis not present

## 2016-09-23 DIAGNOSIS — N186 End stage renal disease: Secondary | ICD-10-CM | POA: Diagnosis not present

## 2016-09-23 DIAGNOSIS — D631 Anemia in chronic kidney disease: Secondary | ICD-10-CM | POA: Diagnosis not present

## 2016-09-23 DIAGNOSIS — D509 Iron deficiency anemia, unspecified: Secondary | ICD-10-CM | POA: Diagnosis not present

## 2016-09-25 DIAGNOSIS — D509 Iron deficiency anemia, unspecified: Secondary | ICD-10-CM | POA: Diagnosis not present

## 2016-09-25 DIAGNOSIS — D631 Anemia in chronic kidney disease: Secondary | ICD-10-CM | POA: Diagnosis not present

## 2016-09-25 DIAGNOSIS — N186 End stage renal disease: Secondary | ICD-10-CM | POA: Diagnosis not present

## 2016-09-25 DIAGNOSIS — N2581 Secondary hyperparathyroidism of renal origin: Secondary | ICD-10-CM | POA: Diagnosis not present

## 2016-09-27 DIAGNOSIS — N186 End stage renal disease: Secondary | ICD-10-CM | POA: Diagnosis not present

## 2016-09-27 DIAGNOSIS — D631 Anemia in chronic kidney disease: Secondary | ICD-10-CM | POA: Diagnosis not present

## 2016-09-27 DIAGNOSIS — D509 Iron deficiency anemia, unspecified: Secondary | ICD-10-CM | POA: Diagnosis not present

## 2016-09-27 DIAGNOSIS — N2581 Secondary hyperparathyroidism of renal origin: Secondary | ICD-10-CM | POA: Diagnosis not present

## 2016-09-29 DIAGNOSIS — D631 Anemia in chronic kidney disease: Secondary | ICD-10-CM | POA: Diagnosis not present

## 2016-09-29 DIAGNOSIS — N186 End stage renal disease: Secondary | ICD-10-CM | POA: Diagnosis not present

## 2016-09-29 DIAGNOSIS — D509 Iron deficiency anemia, unspecified: Secondary | ICD-10-CM | POA: Diagnosis not present

## 2016-09-29 DIAGNOSIS — N2581 Secondary hyperparathyroidism of renal origin: Secondary | ICD-10-CM | POA: Diagnosis not present

## 2016-10-01 DIAGNOSIS — N186 End stage renal disease: Secondary | ICD-10-CM | POA: Diagnosis not present

## 2016-10-01 DIAGNOSIS — N2581 Secondary hyperparathyroidism of renal origin: Secondary | ICD-10-CM | POA: Diagnosis not present

## 2016-10-01 DIAGNOSIS — D509 Iron deficiency anemia, unspecified: Secondary | ICD-10-CM | POA: Diagnosis not present

## 2016-10-01 DIAGNOSIS — D631 Anemia in chronic kidney disease: Secondary | ICD-10-CM | POA: Diagnosis not present

## 2016-10-04 DIAGNOSIS — N186 End stage renal disease: Secondary | ICD-10-CM | POA: Diagnosis not present

## 2016-10-04 DIAGNOSIS — D509 Iron deficiency anemia, unspecified: Secondary | ICD-10-CM | POA: Diagnosis not present

## 2016-10-04 DIAGNOSIS — N2581 Secondary hyperparathyroidism of renal origin: Secondary | ICD-10-CM | POA: Diagnosis not present

## 2016-10-04 DIAGNOSIS — D631 Anemia in chronic kidney disease: Secondary | ICD-10-CM | POA: Diagnosis not present

## 2016-10-08 DIAGNOSIS — D509 Iron deficiency anemia, unspecified: Secondary | ICD-10-CM | POA: Diagnosis not present

## 2016-10-08 DIAGNOSIS — N186 End stage renal disease: Secondary | ICD-10-CM | POA: Diagnosis not present

## 2016-10-08 DIAGNOSIS — D631 Anemia in chronic kidney disease: Secondary | ICD-10-CM | POA: Diagnosis not present

## 2016-10-08 DIAGNOSIS — N2581 Secondary hyperparathyroidism of renal origin: Secondary | ICD-10-CM | POA: Diagnosis not present

## 2016-10-09 DIAGNOSIS — D631 Anemia in chronic kidney disease: Secondary | ICD-10-CM | POA: Diagnosis not present

## 2016-10-09 DIAGNOSIS — D509 Iron deficiency anemia, unspecified: Secondary | ICD-10-CM | POA: Diagnosis not present

## 2016-10-09 DIAGNOSIS — N186 End stage renal disease: Secondary | ICD-10-CM | POA: Diagnosis not present

## 2016-10-09 DIAGNOSIS — N2581 Secondary hyperparathyroidism of renal origin: Secondary | ICD-10-CM | POA: Diagnosis not present

## 2016-10-10 DIAGNOSIS — N186 End stage renal disease: Secondary | ICD-10-CM | POA: Diagnosis not present

## 2016-10-10 DIAGNOSIS — Z992 Dependence on renal dialysis: Secondary | ICD-10-CM | POA: Diagnosis not present

## 2016-10-10 DIAGNOSIS — B20 Human immunodeficiency virus [HIV] disease: Secondary | ICD-10-CM | POA: Diagnosis not present

## 2016-10-11 DIAGNOSIS — N186 End stage renal disease: Secondary | ICD-10-CM | POA: Diagnosis not present

## 2016-10-11 DIAGNOSIS — D509 Iron deficiency anemia, unspecified: Secondary | ICD-10-CM | POA: Diagnosis not present

## 2016-10-11 DIAGNOSIS — N2581 Secondary hyperparathyroidism of renal origin: Secondary | ICD-10-CM | POA: Diagnosis not present

## 2016-10-11 DIAGNOSIS — D631 Anemia in chronic kidney disease: Secondary | ICD-10-CM | POA: Diagnosis not present

## 2016-10-14 DIAGNOSIS — D509 Iron deficiency anemia, unspecified: Secondary | ICD-10-CM | POA: Diagnosis not present

## 2016-10-14 DIAGNOSIS — N186 End stage renal disease: Secondary | ICD-10-CM | POA: Diagnosis not present

## 2016-10-14 DIAGNOSIS — D631 Anemia in chronic kidney disease: Secondary | ICD-10-CM | POA: Diagnosis not present

## 2016-10-14 DIAGNOSIS — N2581 Secondary hyperparathyroidism of renal origin: Secondary | ICD-10-CM | POA: Diagnosis not present

## 2016-10-16 DIAGNOSIS — D631 Anemia in chronic kidney disease: Secondary | ICD-10-CM | POA: Diagnosis not present

## 2016-10-16 DIAGNOSIS — D509 Iron deficiency anemia, unspecified: Secondary | ICD-10-CM | POA: Diagnosis not present

## 2016-10-16 DIAGNOSIS — N2581 Secondary hyperparathyroidism of renal origin: Secondary | ICD-10-CM | POA: Diagnosis not present

## 2016-10-16 DIAGNOSIS — N186 End stage renal disease: Secondary | ICD-10-CM | POA: Diagnosis not present

## 2016-10-18 DIAGNOSIS — N2581 Secondary hyperparathyroidism of renal origin: Secondary | ICD-10-CM | POA: Diagnosis not present

## 2016-10-18 DIAGNOSIS — D509 Iron deficiency anemia, unspecified: Secondary | ICD-10-CM | POA: Diagnosis not present

## 2016-10-18 DIAGNOSIS — N186 End stage renal disease: Secondary | ICD-10-CM | POA: Diagnosis not present

## 2016-10-18 DIAGNOSIS — D631 Anemia in chronic kidney disease: Secondary | ICD-10-CM | POA: Diagnosis not present

## 2016-10-21 DIAGNOSIS — D509 Iron deficiency anemia, unspecified: Secondary | ICD-10-CM | POA: Diagnosis not present

## 2016-10-21 DIAGNOSIS — N186 End stage renal disease: Secondary | ICD-10-CM | POA: Diagnosis not present

## 2016-10-21 DIAGNOSIS — N2581 Secondary hyperparathyroidism of renal origin: Secondary | ICD-10-CM | POA: Diagnosis not present

## 2016-10-21 DIAGNOSIS — D631 Anemia in chronic kidney disease: Secondary | ICD-10-CM | POA: Diagnosis not present

## 2016-10-23 DIAGNOSIS — N186 End stage renal disease: Secondary | ICD-10-CM | POA: Diagnosis not present

## 2016-10-23 DIAGNOSIS — D509 Iron deficiency anemia, unspecified: Secondary | ICD-10-CM | POA: Diagnosis not present

## 2016-10-23 DIAGNOSIS — N2581 Secondary hyperparathyroidism of renal origin: Secondary | ICD-10-CM | POA: Diagnosis not present

## 2016-10-23 DIAGNOSIS — D631 Anemia in chronic kidney disease: Secondary | ICD-10-CM | POA: Diagnosis not present

## 2016-10-25 DIAGNOSIS — D509 Iron deficiency anemia, unspecified: Secondary | ICD-10-CM | POA: Diagnosis not present

## 2016-10-25 DIAGNOSIS — D631 Anemia in chronic kidney disease: Secondary | ICD-10-CM | POA: Diagnosis not present

## 2016-10-25 DIAGNOSIS — N2581 Secondary hyperparathyroidism of renal origin: Secondary | ICD-10-CM | POA: Diagnosis not present

## 2016-10-25 DIAGNOSIS — N186 End stage renal disease: Secondary | ICD-10-CM | POA: Diagnosis not present

## 2016-10-28 DIAGNOSIS — N186 End stage renal disease: Secondary | ICD-10-CM | POA: Diagnosis not present

## 2016-10-28 DIAGNOSIS — D509 Iron deficiency anemia, unspecified: Secondary | ICD-10-CM | POA: Diagnosis not present

## 2016-10-28 DIAGNOSIS — N2581 Secondary hyperparathyroidism of renal origin: Secondary | ICD-10-CM | POA: Diagnosis not present

## 2016-10-28 DIAGNOSIS — D631 Anemia in chronic kidney disease: Secondary | ICD-10-CM | POA: Diagnosis not present

## 2016-10-30 DIAGNOSIS — N186 End stage renal disease: Secondary | ICD-10-CM | POA: Diagnosis not present

## 2016-10-30 DIAGNOSIS — N2581 Secondary hyperparathyroidism of renal origin: Secondary | ICD-10-CM | POA: Diagnosis not present

## 2016-10-30 DIAGNOSIS — D631 Anemia in chronic kidney disease: Secondary | ICD-10-CM | POA: Diagnosis not present

## 2016-10-30 DIAGNOSIS — D509 Iron deficiency anemia, unspecified: Secondary | ICD-10-CM | POA: Diagnosis not present

## 2016-11-01 DIAGNOSIS — D631 Anemia in chronic kidney disease: Secondary | ICD-10-CM | POA: Diagnosis not present

## 2016-11-01 DIAGNOSIS — N186 End stage renal disease: Secondary | ICD-10-CM | POA: Diagnosis not present

## 2016-11-01 DIAGNOSIS — D509 Iron deficiency anemia, unspecified: Secondary | ICD-10-CM | POA: Diagnosis not present

## 2016-11-01 DIAGNOSIS — N2581 Secondary hyperparathyroidism of renal origin: Secondary | ICD-10-CM | POA: Diagnosis not present

## 2016-11-03 DIAGNOSIS — N186 End stage renal disease: Secondary | ICD-10-CM | POA: Diagnosis not present

## 2016-11-03 DIAGNOSIS — N2581 Secondary hyperparathyroidism of renal origin: Secondary | ICD-10-CM | POA: Diagnosis not present

## 2016-11-03 DIAGNOSIS — D509 Iron deficiency anemia, unspecified: Secondary | ICD-10-CM | POA: Diagnosis not present

## 2016-11-03 DIAGNOSIS — D631 Anemia in chronic kidney disease: Secondary | ICD-10-CM | POA: Diagnosis not present

## 2016-11-06 DIAGNOSIS — N186 End stage renal disease: Secondary | ICD-10-CM | POA: Diagnosis not present

## 2016-11-06 DIAGNOSIS — D631 Anemia in chronic kidney disease: Secondary | ICD-10-CM | POA: Diagnosis not present

## 2016-11-06 DIAGNOSIS — N2581 Secondary hyperparathyroidism of renal origin: Secondary | ICD-10-CM | POA: Diagnosis not present

## 2016-11-06 DIAGNOSIS — D509 Iron deficiency anemia, unspecified: Secondary | ICD-10-CM | POA: Diagnosis not present

## 2016-11-08 DIAGNOSIS — D631 Anemia in chronic kidney disease: Secondary | ICD-10-CM | POA: Diagnosis not present

## 2016-11-08 DIAGNOSIS — N186 End stage renal disease: Secondary | ICD-10-CM | POA: Diagnosis not present

## 2016-11-08 DIAGNOSIS — D509 Iron deficiency anemia, unspecified: Secondary | ICD-10-CM | POA: Diagnosis not present

## 2016-11-08 DIAGNOSIS — N2581 Secondary hyperparathyroidism of renal origin: Secondary | ICD-10-CM | POA: Diagnosis not present

## 2016-11-10 DIAGNOSIS — N2581 Secondary hyperparathyroidism of renal origin: Secondary | ICD-10-CM | POA: Diagnosis not present

## 2016-11-10 DIAGNOSIS — N186 End stage renal disease: Secondary | ICD-10-CM | POA: Diagnosis not present

## 2016-11-10 DIAGNOSIS — B20 Human immunodeficiency virus [HIV] disease: Secondary | ICD-10-CM | POA: Diagnosis not present

## 2016-11-10 DIAGNOSIS — Z992 Dependence on renal dialysis: Secondary | ICD-10-CM | POA: Diagnosis not present

## 2016-11-10 DIAGNOSIS — D631 Anemia in chronic kidney disease: Secondary | ICD-10-CM | POA: Diagnosis not present

## 2016-11-10 DIAGNOSIS — D509 Iron deficiency anemia, unspecified: Secondary | ICD-10-CM | POA: Diagnosis not present

## 2016-11-13 DIAGNOSIS — N186 End stage renal disease: Secondary | ICD-10-CM | POA: Diagnosis not present

## 2016-11-13 DIAGNOSIS — D631 Anemia in chronic kidney disease: Secondary | ICD-10-CM | POA: Diagnosis not present

## 2016-11-13 DIAGNOSIS — N2581 Secondary hyperparathyroidism of renal origin: Secondary | ICD-10-CM | POA: Diagnosis not present

## 2016-11-13 DIAGNOSIS — D509 Iron deficiency anemia, unspecified: Secondary | ICD-10-CM | POA: Diagnosis not present

## 2016-11-15 DIAGNOSIS — N186 End stage renal disease: Secondary | ICD-10-CM | POA: Diagnosis not present

## 2016-11-15 DIAGNOSIS — D509 Iron deficiency anemia, unspecified: Secondary | ICD-10-CM | POA: Diagnosis not present

## 2016-11-15 DIAGNOSIS — D631 Anemia in chronic kidney disease: Secondary | ICD-10-CM | POA: Diagnosis not present

## 2016-11-15 DIAGNOSIS — N2581 Secondary hyperparathyroidism of renal origin: Secondary | ICD-10-CM | POA: Diagnosis not present

## 2016-11-18 DIAGNOSIS — D631 Anemia in chronic kidney disease: Secondary | ICD-10-CM | POA: Diagnosis not present

## 2016-11-18 DIAGNOSIS — D509 Iron deficiency anemia, unspecified: Secondary | ICD-10-CM | POA: Diagnosis not present

## 2016-11-18 DIAGNOSIS — N2581 Secondary hyperparathyroidism of renal origin: Secondary | ICD-10-CM | POA: Diagnosis not present

## 2016-11-18 DIAGNOSIS — N186 End stage renal disease: Secondary | ICD-10-CM | POA: Diagnosis not present

## 2016-11-20 DIAGNOSIS — N2581 Secondary hyperparathyroidism of renal origin: Secondary | ICD-10-CM | POA: Diagnosis not present

## 2016-11-20 DIAGNOSIS — D509 Iron deficiency anemia, unspecified: Secondary | ICD-10-CM | POA: Diagnosis not present

## 2016-11-20 DIAGNOSIS — D631 Anemia in chronic kidney disease: Secondary | ICD-10-CM | POA: Diagnosis not present

## 2016-11-20 DIAGNOSIS — N186 End stage renal disease: Secondary | ICD-10-CM | POA: Diagnosis not present

## 2016-11-22 DIAGNOSIS — D631 Anemia in chronic kidney disease: Secondary | ICD-10-CM | POA: Diagnosis not present

## 2016-11-22 DIAGNOSIS — N2581 Secondary hyperparathyroidism of renal origin: Secondary | ICD-10-CM | POA: Diagnosis not present

## 2016-11-22 DIAGNOSIS — D509 Iron deficiency anemia, unspecified: Secondary | ICD-10-CM | POA: Diagnosis not present

## 2016-11-22 DIAGNOSIS — N186 End stage renal disease: Secondary | ICD-10-CM | POA: Diagnosis not present

## 2016-11-25 ENCOUNTER — Encounter: Payer: Self-pay | Admitting: Neurology

## 2016-11-25 ENCOUNTER — Ambulatory Visit (INDEPENDENT_AMBULATORY_CARE_PROVIDER_SITE_OTHER): Payer: Medicare Other | Admitting: Neurology

## 2016-11-25 DIAGNOSIS — B0229 Other postherpetic nervous system involvement: Secondary | ICD-10-CM

## 2016-11-25 DIAGNOSIS — N186 End stage renal disease: Secondary | ICD-10-CM | POA: Diagnosis not present

## 2016-11-25 DIAGNOSIS — D509 Iron deficiency anemia, unspecified: Secondary | ICD-10-CM | POA: Diagnosis not present

## 2016-11-25 DIAGNOSIS — D631 Anemia in chronic kidney disease: Secondary | ICD-10-CM | POA: Diagnosis not present

## 2016-11-25 DIAGNOSIS — N2581 Secondary hyperparathyroidism of renal origin: Secondary | ICD-10-CM | POA: Diagnosis not present

## 2016-11-25 HISTORY — DX: Other postherpetic nervous system involvement: B02.29

## 2016-11-25 MED ORDER — CARBAMAZEPINE 200 MG PO TABS: 200.0000 mg | ORAL_TABLET | Freq: Two times a day (BID) | ORAL | 3 refills | Status: DC

## 2016-11-25 NOTE — Patient Instructions (Signed)
   With the carbamazepine 200 mg tablets, start at 1/2 tablet twice a day for 3 weeks, then take one tablet twice a day.  Tegretol (carbamazepine) may result in dizziness, gait instability, cognitive slowing, or drowsiness. Sometimes, and allergic rash may occur, or a photosensitive rash may occur. If any significant side effects are noted, please contact our office.

## 2016-11-25 NOTE — Progress Notes (Signed)
Reason for visit: Postherpetic neuralgia  Referring physician: Dr. Juliene Pina Broly Hatfield. is a 41 y.o. male  History of present illness:  [name] is a 41 year old right-handed black male with a history of HIV infection, and a history of HIV nephropathy with renal failure, on hemodialysis. The patient had a severe outbreak of shingles on the left V1 distribution in October 2016. Since that time, he has had numbness on the forehead, he has chronic issues with itching sensations on the left brow, he may have occasional sharp shooting pains behind the eye on the left and some episodes of more dull achy pain coming go throughout the day. The patient has been on hydroxyzine without benefit for the itching, topical Benadryl cream has not helped. The patient currently is on gabapentin taking 100 mg 3 times a day without much benefit. He denies any numbness or weakness elsewhere on the body, he denies any issues with balance or difficulty controlling the bowels, he does not produce any more urine. The patient is sent to this office for an evaluation.  Past Medical History:  Diagnosis Date  . Anemia   . Dialysis patient ([place])   . Heart murmur   . HIV disease ([place])   . Hypertension   . Lymphocytosis 06/16/2014  . Noncompliance 03/06/2015  . Post herpetic neuralgia 11/25/2016   Left V1 distribution  . Renal disorder   . Seizure [facility])     Past Surgical History:  Procedure Laterality Date  . AV FISTULA PLACEMENT Left 05/07/2013   Procedure: ARTERIOVENOUS (AV) FISTULA CREATION- LEFT;  Surgeon: Rosetta Posner, MD;  Location: [place];  Service: Vascular;  Laterality: Left;  . AV FISTULA PLACEMENT Left 08/12/2013   Procedure: ARTERIOVENOUS (AV) FISTULA CREATION BRACHIOCEPHALIC;  Surgeon: Angelia Mould, MD;  Location: [place];  Service: Vascular;  Laterality: Left;  . INSERTION OF DIALYSIS CATHETER     x 2  . LIGATION OF ARTERIOVENOUS  FISTULA Left 08/12/2013   Procedure: LIGATION OF  ARTERIOVENOUS  FISTULA RADIOCEPHALIC;  Surgeon: Angelia Mould, MD;  Location: [facility] OR;  Service: Vascular;  Laterality: Left;    Family History  Problem Relation Age of Onset  . Kidney failure Mother   . Colon polyps Father   . Kidney disease Paternal Uncle   . Kidney disease Maternal Grandfather   . Kidney failure Maternal Uncle     Social history:  reports that he has never smoked. He has never used smokeless tobacco. He reports that he does not drink alcohol or use drugs.  Medications:  Prior to Admission medications   Medication Sig Start Date End Date Taking? Authorizing Provider  abacavir (ZIAGEN) 300 MG tablet Take 2 tablets (600 mg total) by mouth daily. 01/11/16  Yes Thayer Headings, MD  carvedilol (COREG) 6.25 MG tablet Take 1 tablet (6.25 mg total) by mouth 2 (two) times daily. 02/07/15  Yes Jettie Booze, MD  dolutegravir (TIVICAY) 50 MG tablet Take 1 tablet (50 mg total) by mouth daily. 01/11/16  Yes Thayer Headings, MD  gabapentin (NEURONTIN) 100 MG capsule  10/29/16  Yes Historical Provider, MD  ibuprofen (ADVIL,MOTRIN) 200 MG tablet Take 800 mg by mouth every 6 (six) hours as needed for headache. Reported on 01/18/2016   Yes Historical Provider, MD  lamivudine (EPIVIR HBV) 100 MG tablet Take 0.5 tablets (50 mg total) by mouth daily. 01/11/16  Yes Thayer Headings, MD  rilpivirine (EDURANT) 25 MG TABS tablet Take  1 tablet (25 mg total) by mouth daily with breakfast. 01/11/16  Yes Thayer Headings, MD  valACYclovir (VALTREX) 500 MG tablet Take 500 mg by mouth daily. Reported on 01/18/2016   Yes Historical Provider, MD  carbamazepine (TEGRETOL) 200 MG tablet Take 1 tablet (200 mg total) by mouth 2 (two) times daily. 11/25/16   Kathrynn Ducking, MD      Allergies  Allergen Reactions  . Codeine Itching  . Eggs Or Egg-Derived Products     Shortness of breath  . Heparin Itching    Severe itching  . Mercury     Childhood allergy  . Tomato Itching    ROS:  Out of a complete  14 system review of symptoms, the patient complains only of the following symptoms, and all other reviewed systems are negative.  Numbness Itching  Blood pressure 119/79, pulse (!) 102, height 5' 7.5" (1.715 m), weight 222 lb (100.7 kg).  Physical Exam  General: The patient is alert and cooperative at the time of the examination.  Eyes: Pupils are equal, round, and reactive to light. Discs are flat bilaterally.  Neck: The neck is supple, no carotid bruits are noted.  Respiratory: The respiratory examination is clear.  Cardiovascular: The cardiovascular examination reveals a regular rate and rhythm, no obvious murmurs or rubs are noted.  Skin: Extremities are without significant edema.  Neurologic Exam  Mental status: The patient is alert and oriented x 3 at the time of the examination. The patient has apparent normal recent and remote memory, with an apparently normal attention span and concentration ability.  Cranial nerves: Facial symmetry is present. There is good sensation of the face to pinprick and soft touch bilaterally, with exception of decreased pinprick sensation in the V1 distribution on the left. The strength of the facial muscles and the muscles to head turning and shoulder shrug are normal bilaterally. Speech is well enunciated, no aphasia or dysarthria is noted. Extraocular movements are full. Visual fields are full. The tongue is midline, and the patient has symmetric elevation of the soft palate. No obvious hearing deficits are noted.  Motor: The motor testing reveals 5 over 5 strength of all 4 extremities. Good symmetric motor tone is noted throughout.  Sensory: Sensory testing is intact to pinprick, soft touch, vibration sensation, and position sense on all 4 extremities. No evidence of extinction is noted.  Coordination: Cerebellar testing reveals good finger-nose-finger and heel-to-shin bilaterally.  Gait and station: Gait is normal. Tandem gait is normal.  Romberg is negative. No drift is seen.  Reflexes: Deep tendon reflexes are symmetric and normal bilaterally. Toes are downgoing bilaterally.   Assessment/Plan:  1. Postherpetic neuralgia, left V1 distribution  The patient has a residual of numbness, itching sensation, dull achy pain and occasional sharp shooting pains in the V1 distribution on the left following a severe shingle outbreak. The patient is on gabapentin, he does have end-stage renal disease and this does cannot be increased dramatically. The patient will be placed on carbamazepine for the discomfort, he will follow-up in 3 months. He will contact our office for any dose adjustments. He inquires about the use of Botox for his neuralgia pain. There have been some reports of benefit with the use of Botox with trigeminal neuralgia, but there are no indications through the FDA for use of Botox in this capacity.   Jill Alexanders MD 11/25/2016 3:36 PM  [facility] [address] [place], [place] [address]  Phone [phone] Fax  [phone]  

## 2016-11-27 DIAGNOSIS — N186 End stage renal disease: Secondary | ICD-10-CM | POA: Diagnosis not present

## 2016-11-27 DIAGNOSIS — D509 Iron deficiency anemia, unspecified: Secondary | ICD-10-CM | POA: Diagnosis not present

## 2016-11-27 DIAGNOSIS — D631 Anemia in chronic kidney disease: Secondary | ICD-10-CM | POA: Diagnosis not present

## 2016-11-27 DIAGNOSIS — N2581 Secondary hyperparathyroidism of renal origin: Secondary | ICD-10-CM | POA: Diagnosis not present

## 2016-11-29 DIAGNOSIS — N186 End stage renal disease: Secondary | ICD-10-CM | POA: Diagnosis not present

## 2016-11-29 DIAGNOSIS — D509 Iron deficiency anemia, unspecified: Secondary | ICD-10-CM | POA: Diagnosis not present

## 2016-11-29 DIAGNOSIS — D631 Anemia in chronic kidney disease: Secondary | ICD-10-CM | POA: Diagnosis not present

## 2016-11-29 DIAGNOSIS — N2581 Secondary hyperparathyroidism of renal origin: Secondary | ICD-10-CM | POA: Diagnosis not present

## 2016-12-02 DIAGNOSIS — N2581 Secondary hyperparathyroidism of renal origin: Secondary | ICD-10-CM | POA: Diagnosis not present

## 2016-12-02 DIAGNOSIS — D631 Anemia in chronic kidney disease: Secondary | ICD-10-CM | POA: Diagnosis not present

## 2016-12-02 DIAGNOSIS — D509 Iron deficiency anemia, unspecified: Secondary | ICD-10-CM | POA: Diagnosis not present

## 2016-12-02 DIAGNOSIS — N186 End stage renal disease: Secondary | ICD-10-CM | POA: Diagnosis not present

## 2016-12-04 DIAGNOSIS — D509 Iron deficiency anemia, unspecified: Secondary | ICD-10-CM | POA: Diagnosis not present

## 2016-12-04 DIAGNOSIS — N186 End stage renal disease: Secondary | ICD-10-CM | POA: Diagnosis not present

## 2016-12-04 DIAGNOSIS — N2581 Secondary hyperparathyroidism of renal origin: Secondary | ICD-10-CM | POA: Diagnosis not present

## 2016-12-04 DIAGNOSIS — D631 Anemia in chronic kidney disease: Secondary | ICD-10-CM | POA: Diagnosis not present

## 2016-12-06 DIAGNOSIS — N2581 Secondary hyperparathyroidism of renal origin: Secondary | ICD-10-CM | POA: Diagnosis not present

## 2016-12-06 DIAGNOSIS — D509 Iron deficiency anemia, unspecified: Secondary | ICD-10-CM | POA: Diagnosis not present

## 2016-12-06 DIAGNOSIS — D631 Anemia in chronic kidney disease: Secondary | ICD-10-CM | POA: Diagnosis not present

## 2016-12-06 DIAGNOSIS — N186 End stage renal disease: Secondary | ICD-10-CM | POA: Diagnosis not present

## 2016-12-09 DIAGNOSIS — D509 Iron deficiency anemia, unspecified: Secondary | ICD-10-CM | POA: Diagnosis not present

## 2016-12-09 DIAGNOSIS — N186 End stage renal disease: Secondary | ICD-10-CM | POA: Diagnosis not present

## 2016-12-09 DIAGNOSIS — D631 Anemia in chronic kidney disease: Secondary | ICD-10-CM | POA: Diagnosis not present

## 2016-12-09 DIAGNOSIS — N2581 Secondary hyperparathyroidism of renal origin: Secondary | ICD-10-CM | POA: Diagnosis not present

## 2016-12-11 DIAGNOSIS — D631 Anemia in chronic kidney disease: Secondary | ICD-10-CM | POA: Diagnosis not present

## 2016-12-11 DIAGNOSIS — N2581 Secondary hyperparathyroidism of renal origin: Secondary | ICD-10-CM | POA: Diagnosis not present

## 2016-12-11 DIAGNOSIS — B20 Human immunodeficiency virus [HIV] disease: Secondary | ICD-10-CM | POA: Diagnosis not present

## 2016-12-11 DIAGNOSIS — N186 End stage renal disease: Secondary | ICD-10-CM | POA: Diagnosis not present

## 2016-12-11 DIAGNOSIS — D509 Iron deficiency anemia, unspecified: Secondary | ICD-10-CM | POA: Diagnosis not present

## 2016-12-11 DIAGNOSIS — Z992 Dependence on renal dialysis: Secondary | ICD-10-CM | POA: Diagnosis not present

## 2016-12-13 DIAGNOSIS — N186 End stage renal disease: Secondary | ICD-10-CM | POA: Diagnosis not present

## 2016-12-13 DIAGNOSIS — N2581 Secondary hyperparathyroidism of renal origin: Secondary | ICD-10-CM | POA: Diagnosis not present

## 2016-12-13 DIAGNOSIS — D631 Anemia in chronic kidney disease: Secondary | ICD-10-CM | POA: Diagnosis not present

## 2016-12-16 DIAGNOSIS — N186 End stage renal disease: Secondary | ICD-10-CM | POA: Diagnosis not present

## 2016-12-16 DIAGNOSIS — N2581 Secondary hyperparathyroidism of renal origin: Secondary | ICD-10-CM | POA: Diagnosis not present

## 2016-12-16 DIAGNOSIS — D631 Anemia in chronic kidney disease: Secondary | ICD-10-CM | POA: Diagnosis not present

## 2016-12-20 DIAGNOSIS — N186 End stage renal disease: Secondary | ICD-10-CM | POA: Diagnosis not present

## 2016-12-20 DIAGNOSIS — D631 Anemia in chronic kidney disease: Secondary | ICD-10-CM | POA: Diagnosis not present

## 2016-12-20 DIAGNOSIS — N2581 Secondary hyperparathyroidism of renal origin: Secondary | ICD-10-CM | POA: Diagnosis not present

## 2016-12-23 DIAGNOSIS — D631 Anemia in chronic kidney disease: Secondary | ICD-10-CM | POA: Diagnosis not present

## 2016-12-23 DIAGNOSIS — N186 End stage renal disease: Secondary | ICD-10-CM | POA: Diagnosis not present

## 2016-12-23 DIAGNOSIS — N2581 Secondary hyperparathyroidism of renal origin: Secondary | ICD-10-CM | POA: Diagnosis not present

## 2016-12-25 DIAGNOSIS — D631 Anemia in chronic kidney disease: Secondary | ICD-10-CM | POA: Diagnosis not present

## 2016-12-25 DIAGNOSIS — N186 End stage renal disease: Secondary | ICD-10-CM | POA: Diagnosis not present

## 2016-12-25 DIAGNOSIS — N2581 Secondary hyperparathyroidism of renal origin: Secondary | ICD-10-CM | POA: Diagnosis not present

## 2016-12-27 DIAGNOSIS — N186 End stage renal disease: Secondary | ICD-10-CM | POA: Diagnosis not present

## 2016-12-27 DIAGNOSIS — D631 Anemia in chronic kidney disease: Secondary | ICD-10-CM | POA: Diagnosis not present

## 2016-12-27 DIAGNOSIS — N2581 Secondary hyperparathyroidism of renal origin: Secondary | ICD-10-CM | POA: Diagnosis not present

## 2016-12-30 DIAGNOSIS — D631 Anemia in chronic kidney disease: Secondary | ICD-10-CM | POA: Diagnosis not present

## 2016-12-30 DIAGNOSIS — N2581 Secondary hyperparathyroidism of renal origin: Secondary | ICD-10-CM | POA: Diagnosis not present

## 2016-12-30 DIAGNOSIS — N186 End stage renal disease: Secondary | ICD-10-CM | POA: Diagnosis not present

## 2017-01-01 DIAGNOSIS — N186 End stage renal disease: Secondary | ICD-10-CM | POA: Diagnosis not present

## 2017-01-01 DIAGNOSIS — D631 Anemia in chronic kidney disease: Secondary | ICD-10-CM | POA: Diagnosis not present

## 2017-01-01 DIAGNOSIS — N2581 Secondary hyperparathyroidism of renal origin: Secondary | ICD-10-CM | POA: Diagnosis not present

## 2017-01-03 DIAGNOSIS — D631 Anemia in chronic kidney disease: Secondary | ICD-10-CM | POA: Diagnosis not present

## 2017-01-03 DIAGNOSIS — N2581 Secondary hyperparathyroidism of renal origin: Secondary | ICD-10-CM | POA: Diagnosis not present

## 2017-01-03 DIAGNOSIS — N186 End stage renal disease: Secondary | ICD-10-CM | POA: Diagnosis not present

## 2017-01-06 DIAGNOSIS — D631 Anemia in chronic kidney disease: Secondary | ICD-10-CM | POA: Diagnosis not present

## 2017-01-06 DIAGNOSIS — N2581 Secondary hyperparathyroidism of renal origin: Secondary | ICD-10-CM | POA: Diagnosis not present

## 2017-01-06 DIAGNOSIS — N186 End stage renal disease: Secondary | ICD-10-CM | POA: Diagnosis not present

## 2017-01-08 DIAGNOSIS — Z992 Dependence on renal dialysis: Secondary | ICD-10-CM | POA: Diagnosis not present

## 2017-01-08 DIAGNOSIS — N186 End stage renal disease: Secondary | ICD-10-CM | POA: Diagnosis not present

## 2017-01-08 DIAGNOSIS — N2581 Secondary hyperparathyroidism of renal origin: Secondary | ICD-10-CM | POA: Diagnosis not present

## 2017-01-08 DIAGNOSIS — B20 Human immunodeficiency virus [HIV] disease: Secondary | ICD-10-CM | POA: Diagnosis not present

## 2017-01-08 DIAGNOSIS — D631 Anemia in chronic kidney disease: Secondary | ICD-10-CM | POA: Diagnosis not present

## 2017-01-10 ENCOUNTER — Other Ambulatory Visit: Payer: Self-pay | Admitting: Internal Medicine

## 2017-01-10 DIAGNOSIS — D631 Anemia in chronic kidney disease: Secondary | ICD-10-CM | POA: Diagnosis not present

## 2017-01-10 DIAGNOSIS — B20 Human immunodeficiency virus [HIV] disease: Secondary | ICD-10-CM

## 2017-01-10 DIAGNOSIS — N186 End stage renal disease: Secondary | ICD-10-CM | POA: Diagnosis not present

## 2017-01-10 DIAGNOSIS — N2581 Secondary hyperparathyroidism of renal origin: Secondary | ICD-10-CM | POA: Diagnosis not present

## 2017-01-12 ENCOUNTER — Other Ambulatory Visit: Payer: Self-pay | Admitting: Internal Medicine

## 2017-01-12 DIAGNOSIS — B20 Human immunodeficiency virus [HIV] disease: Secondary | ICD-10-CM

## 2017-01-13 DIAGNOSIS — N2581 Secondary hyperparathyroidism of renal origin: Secondary | ICD-10-CM | POA: Diagnosis not present

## 2017-01-13 DIAGNOSIS — D631 Anemia in chronic kidney disease: Secondary | ICD-10-CM | POA: Diagnosis not present

## 2017-01-13 DIAGNOSIS — N186 End stage renal disease: Secondary | ICD-10-CM | POA: Diagnosis not present

## 2017-01-15 DIAGNOSIS — D631 Anemia in chronic kidney disease: Secondary | ICD-10-CM | POA: Diagnosis not present

## 2017-01-15 DIAGNOSIS — N186 End stage renal disease: Secondary | ICD-10-CM | POA: Diagnosis not present

## 2017-01-15 DIAGNOSIS — N2581 Secondary hyperparathyroidism of renal origin: Secondary | ICD-10-CM | POA: Diagnosis not present

## 2017-01-17 DIAGNOSIS — N186 End stage renal disease: Secondary | ICD-10-CM | POA: Diagnosis not present

## 2017-01-17 DIAGNOSIS — D631 Anemia in chronic kidney disease: Secondary | ICD-10-CM | POA: Diagnosis not present

## 2017-01-17 DIAGNOSIS — N2581 Secondary hyperparathyroidism of renal origin: Secondary | ICD-10-CM | POA: Diagnosis not present

## 2017-01-20 DIAGNOSIS — N2581 Secondary hyperparathyroidism of renal origin: Secondary | ICD-10-CM | POA: Diagnosis not present

## 2017-01-20 DIAGNOSIS — D631 Anemia in chronic kidney disease: Secondary | ICD-10-CM | POA: Diagnosis not present

## 2017-01-20 DIAGNOSIS — N186 End stage renal disease: Secondary | ICD-10-CM | POA: Diagnosis not present

## 2017-01-22 DIAGNOSIS — N186 End stage renal disease: Secondary | ICD-10-CM | POA: Diagnosis not present

## 2017-01-22 DIAGNOSIS — N2581 Secondary hyperparathyroidism of renal origin: Secondary | ICD-10-CM | POA: Diagnosis not present

## 2017-01-22 DIAGNOSIS — D631 Anemia in chronic kidney disease: Secondary | ICD-10-CM | POA: Diagnosis not present

## 2017-01-24 DIAGNOSIS — N186 End stage renal disease: Secondary | ICD-10-CM | POA: Diagnosis not present

## 2017-01-24 DIAGNOSIS — D631 Anemia in chronic kidney disease: Secondary | ICD-10-CM | POA: Diagnosis not present

## 2017-01-24 DIAGNOSIS — N2581 Secondary hyperparathyroidism of renal origin: Secondary | ICD-10-CM | POA: Diagnosis not present

## 2017-01-27 DIAGNOSIS — D631 Anemia in chronic kidney disease: Secondary | ICD-10-CM | POA: Diagnosis not present

## 2017-01-27 DIAGNOSIS — N2581 Secondary hyperparathyroidism of renal origin: Secondary | ICD-10-CM | POA: Diagnosis not present

## 2017-01-27 DIAGNOSIS — N186 End stage renal disease: Secondary | ICD-10-CM | POA: Diagnosis not present

## 2017-01-29 DIAGNOSIS — N2581 Secondary hyperparathyroidism of renal origin: Secondary | ICD-10-CM | POA: Diagnosis not present

## 2017-01-29 DIAGNOSIS — D631 Anemia in chronic kidney disease: Secondary | ICD-10-CM | POA: Diagnosis not present

## 2017-01-29 DIAGNOSIS — N186 End stage renal disease: Secondary | ICD-10-CM | POA: Diagnosis not present

## 2017-01-31 DIAGNOSIS — N186 End stage renal disease: Secondary | ICD-10-CM | POA: Diagnosis not present

## 2017-01-31 DIAGNOSIS — N2581 Secondary hyperparathyroidism of renal origin: Secondary | ICD-10-CM | POA: Diagnosis not present

## 2017-01-31 DIAGNOSIS — D631 Anemia in chronic kidney disease: Secondary | ICD-10-CM | POA: Diagnosis not present

## 2017-02-03 DIAGNOSIS — D631 Anemia in chronic kidney disease: Secondary | ICD-10-CM | POA: Diagnosis not present

## 2017-02-03 DIAGNOSIS — N186 End stage renal disease: Secondary | ICD-10-CM | POA: Diagnosis not present

## 2017-02-03 DIAGNOSIS — N2581 Secondary hyperparathyroidism of renal origin: Secondary | ICD-10-CM | POA: Diagnosis not present

## 2017-02-05 DIAGNOSIS — N2581 Secondary hyperparathyroidism of renal origin: Secondary | ICD-10-CM | POA: Diagnosis not present

## 2017-02-05 DIAGNOSIS — D631 Anemia in chronic kidney disease: Secondary | ICD-10-CM | POA: Diagnosis not present

## 2017-02-05 DIAGNOSIS — N186 End stage renal disease: Secondary | ICD-10-CM | POA: Diagnosis not present

## 2017-02-07 DIAGNOSIS — D631 Anemia in chronic kidney disease: Secondary | ICD-10-CM | POA: Diagnosis not present

## 2017-02-07 DIAGNOSIS — N2581 Secondary hyperparathyroidism of renal origin: Secondary | ICD-10-CM | POA: Diagnosis not present

## 2017-02-07 DIAGNOSIS — N186 End stage renal disease: Secondary | ICD-10-CM | POA: Diagnosis not present

## 2017-02-08 ENCOUNTER — Other Ambulatory Visit: Payer: Self-pay | Admitting: Infectious Disease

## 2017-02-08 DIAGNOSIS — Z992 Dependence on renal dialysis: Secondary | ICD-10-CM | POA: Diagnosis not present

## 2017-02-08 DIAGNOSIS — B20 Human immunodeficiency virus [HIV] disease: Secondary | ICD-10-CM | POA: Diagnosis not present

## 2017-02-08 DIAGNOSIS — N186 End stage renal disease: Secondary | ICD-10-CM | POA: Diagnosis not present

## 2017-02-10 ENCOUNTER — Other Ambulatory Visit: Payer: Self-pay | Admitting: *Deleted

## 2017-02-10 DIAGNOSIS — N2581 Secondary hyperparathyroidism of renal origin: Secondary | ICD-10-CM | POA: Diagnosis not present

## 2017-02-10 DIAGNOSIS — B20 Human immunodeficiency virus [HIV] disease: Secondary | ICD-10-CM

## 2017-02-10 DIAGNOSIS — N186 End stage renal disease: Secondary | ICD-10-CM | POA: Diagnosis not present

## 2017-02-10 MED ORDER — LAMIVUDINE 100 MG PO TABS: 50.0000 mg | ORAL_TABLET | Freq: Every day | ORAL | 4 refills | Status: DC

## 2017-02-12 DIAGNOSIS — N2581 Secondary hyperparathyroidism of renal origin: Secondary | ICD-10-CM | POA: Diagnosis not present

## 2017-02-12 DIAGNOSIS — N186 End stage renal disease: Secondary | ICD-10-CM | POA: Diagnosis not present

## 2017-02-14 DIAGNOSIS — N186 End stage renal disease: Secondary | ICD-10-CM | POA: Diagnosis not present

## 2017-02-14 DIAGNOSIS — N2581 Secondary hyperparathyroidism of renal origin: Secondary | ICD-10-CM | POA: Diagnosis not present

## 2017-02-17 DIAGNOSIS — N2581 Secondary hyperparathyroidism of renal origin: Secondary | ICD-10-CM | POA: Diagnosis not present

## 2017-02-17 DIAGNOSIS — N186 End stage renal disease: Secondary | ICD-10-CM | POA: Diagnosis not present

## 2017-02-18 DIAGNOSIS — D631 Anemia in chronic kidney disease: Secondary | ICD-10-CM | POA: Diagnosis not present

## 2017-02-18 DIAGNOSIS — Z23 Encounter for immunization: Secondary | ICD-10-CM | POA: Diagnosis not present

## 2017-02-18 DIAGNOSIS — D513 Other dietary vitamin B12 deficiency anemia: Secondary | ICD-10-CM | POA: Diagnosis not present

## 2017-02-18 DIAGNOSIS — Z4931 Encounter for adequacy testing for hemodialysis: Secondary | ICD-10-CM | POA: Diagnosis not present

## 2017-02-18 DIAGNOSIS — N2581 Secondary hyperparathyroidism of renal origin: Secondary | ICD-10-CM | POA: Diagnosis not present

## 2017-02-18 DIAGNOSIS — K7689 Other specified diseases of liver: Secondary | ICD-10-CM | POA: Diagnosis not present

## 2017-02-18 DIAGNOSIS — D509 Iron deficiency anemia, unspecified: Secondary | ICD-10-CM | POA: Diagnosis not present

## 2017-02-18 DIAGNOSIS — N186 End stage renal disease: Secondary | ICD-10-CM | POA: Diagnosis not present

## 2017-02-18 DIAGNOSIS — N2589 Other disorders resulting from impaired renal tubular function: Secondary | ICD-10-CM | POA: Diagnosis not present

## 2017-02-18 DIAGNOSIS — R8299 Other abnormal findings in urine: Secondary | ICD-10-CM | POA: Diagnosis not present

## 2017-02-20 DIAGNOSIS — D509 Iron deficiency anemia, unspecified: Secondary | ICD-10-CM | POA: Diagnosis not present

## 2017-02-20 DIAGNOSIS — D513 Other dietary vitamin B12 deficiency anemia: Secondary | ICD-10-CM | POA: Diagnosis not present

## 2017-02-20 DIAGNOSIS — Z4931 Encounter for adequacy testing for hemodialysis: Secondary | ICD-10-CM | POA: Diagnosis not present

## 2017-02-20 DIAGNOSIS — N2581 Secondary hyperparathyroidism of renal origin: Secondary | ICD-10-CM | POA: Diagnosis not present

## 2017-02-20 DIAGNOSIS — K7689 Other specified diseases of liver: Secondary | ICD-10-CM | POA: Diagnosis not present

## 2017-02-20 DIAGNOSIS — N186 End stage renal disease: Secondary | ICD-10-CM | POA: Diagnosis not present

## 2017-02-24 ENCOUNTER — Ambulatory Visit: Payer: Medicare Other | Admitting: Adult Health

## 2017-02-24 ENCOUNTER — Telehealth: Payer: Self-pay | Admitting: *Deleted

## 2017-02-24 DIAGNOSIS — N186 End stage renal disease: Secondary | ICD-10-CM | POA: Diagnosis not present

## 2017-02-24 DIAGNOSIS — K7689 Other specified diseases of liver: Secondary | ICD-10-CM | POA: Diagnosis not present

## 2017-02-24 DIAGNOSIS — N2581 Secondary hyperparathyroidism of renal origin: Secondary | ICD-10-CM | POA: Diagnosis not present

## 2017-02-24 DIAGNOSIS — D509 Iron deficiency anemia, unspecified: Secondary | ICD-10-CM | POA: Diagnosis not present

## 2017-02-24 DIAGNOSIS — D513 Other dietary vitamin B12 deficiency anemia: Secondary | ICD-10-CM | POA: Diagnosis not present

## 2017-02-24 DIAGNOSIS — Z4931 Encounter for adequacy testing for hemodialysis: Secondary | ICD-10-CM | POA: Diagnosis not present

## 2017-02-24 NOTE — Telephone Encounter (Signed)
LVM informing patient his follow up was cancelled today due to power outage at office. Left number and advised he call tomorrow to reschedule.

## 2017-02-25 DIAGNOSIS — Z4931 Encounter for adequacy testing for hemodialysis: Secondary | ICD-10-CM | POA: Diagnosis not present

## 2017-02-25 DIAGNOSIS — D513 Other dietary vitamin B12 deficiency anemia: Secondary | ICD-10-CM | POA: Diagnosis not present

## 2017-02-25 DIAGNOSIS — N2581 Secondary hyperparathyroidism of renal origin: Secondary | ICD-10-CM | POA: Diagnosis not present

## 2017-02-25 DIAGNOSIS — K7689 Other specified diseases of liver: Secondary | ICD-10-CM | POA: Diagnosis not present

## 2017-02-25 DIAGNOSIS — D509 Iron deficiency anemia, unspecified: Secondary | ICD-10-CM | POA: Diagnosis not present

## 2017-02-25 DIAGNOSIS — N186 End stage renal disease: Secondary | ICD-10-CM | POA: Diagnosis not present

## 2017-02-27 DIAGNOSIS — N2581 Secondary hyperparathyroidism of renal origin: Secondary | ICD-10-CM | POA: Diagnosis not present

## 2017-02-27 DIAGNOSIS — K7689 Other specified diseases of liver: Secondary | ICD-10-CM | POA: Diagnosis not present

## 2017-02-27 DIAGNOSIS — N186 End stage renal disease: Secondary | ICD-10-CM | POA: Diagnosis not present

## 2017-02-27 DIAGNOSIS — Z4931 Encounter for adequacy testing for hemodialysis: Secondary | ICD-10-CM | POA: Diagnosis not present

## 2017-02-27 DIAGNOSIS — D513 Other dietary vitamin B12 deficiency anemia: Secondary | ICD-10-CM | POA: Diagnosis not present

## 2017-02-27 DIAGNOSIS — D509 Iron deficiency anemia, unspecified: Secondary | ICD-10-CM | POA: Diagnosis not present

## 2017-02-28 DIAGNOSIS — K7689 Other specified diseases of liver: Secondary | ICD-10-CM | POA: Diagnosis not present

## 2017-02-28 DIAGNOSIS — N186 End stage renal disease: Secondary | ICD-10-CM | POA: Diagnosis not present

## 2017-02-28 DIAGNOSIS — D513 Other dietary vitamin B12 deficiency anemia: Secondary | ICD-10-CM | POA: Diagnosis not present

## 2017-02-28 DIAGNOSIS — D509 Iron deficiency anemia, unspecified: Secondary | ICD-10-CM | POA: Diagnosis not present

## 2017-02-28 DIAGNOSIS — N2581 Secondary hyperparathyroidism of renal origin: Secondary | ICD-10-CM | POA: Diagnosis not present

## 2017-02-28 DIAGNOSIS — Z4931 Encounter for adequacy testing for hemodialysis: Secondary | ICD-10-CM | POA: Diagnosis not present

## 2017-03-03 DIAGNOSIS — K7689 Other specified diseases of liver: Secondary | ICD-10-CM | POA: Diagnosis not present

## 2017-03-03 DIAGNOSIS — Z4931 Encounter for adequacy testing for hemodialysis: Secondary | ICD-10-CM | POA: Diagnosis not present

## 2017-03-03 DIAGNOSIS — N186 End stage renal disease: Secondary | ICD-10-CM | POA: Diagnosis not present

## 2017-03-03 DIAGNOSIS — N2581 Secondary hyperparathyroidism of renal origin: Secondary | ICD-10-CM | POA: Diagnosis not present

## 2017-03-03 DIAGNOSIS — D513 Other dietary vitamin B12 deficiency anemia: Secondary | ICD-10-CM | POA: Diagnosis not present

## 2017-03-03 DIAGNOSIS — D509 Iron deficiency anemia, unspecified: Secondary | ICD-10-CM | POA: Diagnosis not present

## 2017-03-04 DIAGNOSIS — Z4931 Encounter for adequacy testing for hemodialysis: Secondary | ICD-10-CM | POA: Diagnosis not present

## 2017-03-04 DIAGNOSIS — N2581 Secondary hyperparathyroidism of renal origin: Secondary | ICD-10-CM | POA: Diagnosis not present

## 2017-03-04 DIAGNOSIS — K7689 Other specified diseases of liver: Secondary | ICD-10-CM | POA: Diagnosis not present

## 2017-03-04 DIAGNOSIS — D509 Iron deficiency anemia, unspecified: Secondary | ICD-10-CM | POA: Diagnosis not present

## 2017-03-04 DIAGNOSIS — D513 Other dietary vitamin B12 deficiency anemia: Secondary | ICD-10-CM | POA: Diagnosis not present

## 2017-03-04 DIAGNOSIS — N186 End stage renal disease: Secondary | ICD-10-CM | POA: Diagnosis not present

## 2017-03-07 DIAGNOSIS — K7689 Other specified diseases of liver: Secondary | ICD-10-CM | POA: Diagnosis not present

## 2017-03-07 DIAGNOSIS — D509 Iron deficiency anemia, unspecified: Secondary | ICD-10-CM | POA: Diagnosis not present

## 2017-03-07 DIAGNOSIS — D513 Other dietary vitamin B12 deficiency anemia: Secondary | ICD-10-CM | POA: Diagnosis not present

## 2017-03-07 DIAGNOSIS — N186 End stage renal disease: Secondary | ICD-10-CM | POA: Diagnosis not present

## 2017-03-07 DIAGNOSIS — N2581 Secondary hyperparathyroidism of renal origin: Secondary | ICD-10-CM | POA: Diagnosis not present

## 2017-03-07 DIAGNOSIS — Z4931 Encounter for adequacy testing for hemodialysis: Secondary | ICD-10-CM | POA: Diagnosis not present

## 2017-03-08 ENCOUNTER — Emergency Department (HOSPITAL_BASED_OUTPATIENT_CLINIC_OR_DEPARTMENT_OTHER)
Admission: EM | Admit: 2017-03-08 | Discharge: 2017-03-08 | Disposition: A | Discharge status: Home / Self Care | Payer: Medicare Other | Attending: Emergency Medicine | Admitting: Emergency Medicine

## 2017-03-08 ENCOUNTER — Encounter (HOSPITAL_BASED_OUTPATIENT_CLINIC_OR_DEPARTMENT_OTHER): Payer: Self-pay | Admitting: *Deleted

## 2017-03-08 DIAGNOSIS — Z21 Asymptomatic human immunodeficiency virus [HIV] infection status: Secondary | ICD-10-CM | POA: Diagnosis not present

## 2017-03-08 DIAGNOSIS — R21 Rash and other nonspecific skin eruption: Secondary | ICD-10-CM

## 2017-03-08 DIAGNOSIS — N186 End stage renal disease: Secondary | ICD-10-CM | POA: Insufficient documentation

## 2017-03-08 DIAGNOSIS — Z79899 Other long term (current) drug therapy: Secondary | ICD-10-CM | POA: Diagnosis not present

## 2017-03-08 DIAGNOSIS — Z992 Dependence on renal dialysis: Secondary | ICD-10-CM | POA: Diagnosis not present

## 2017-03-08 DIAGNOSIS — I120 Hypertensive chronic kidney disease with stage 5 chronic kidney disease or end stage renal disease: Secondary | ICD-10-CM | POA: Insufficient documentation

## 2017-03-08 MED ORDER — DEXAMETHASONE 4 MG PO TABS
8.0000 mg | ORAL_TABLET | Freq: Once | ORAL | Status: AC
  Administered 2017-03-08: 8 mg via ORAL
  Filled 2017-03-08: qty 2

## 2017-03-08 MED ORDER — PREDNISONE 50 MG PO TABS: ORAL_TABLET | ORAL | 0 refills | Status: DC

## 2017-03-08 MED ORDER — DIPHENHYDRAMINE HCL 50 MG/ML IJ SOLN
50.0000 mg | Freq: Once | INTRAMUSCULAR | Status: AC
  Administered 2017-03-08: 50 mg via INTRAMUSCULAR
  Filled 2017-03-08: qty 1

## 2017-03-08 NOTE — ED Notes (Signed)
EDP into room 

## 2017-03-08 NOTE — ED Triage Notes (Signed)
Pt reports that he has had a rash on his chest, back, arms x 2 days with itching.  States that he has changed his detergent that he uses.

## 2017-03-08 NOTE — ED Notes (Addendum)
Alert, NAD, calm, interactive, resps e/u, speaking in clear complete sentences, no dyspnea noted, skin W&D, fine, raised red dry rash and erythema present c/o itching and rash, onset yesterday, no relief with benadryl, nothing topical applied, no h/o same, (denies: pain, sob, nvd, fever). States, "have had hives before, and this is not hives". Family at [facility].  Reports allergy to Codeine, eggs and tomatoes.

## 2017-03-08 NOTE — ED Notes (Signed)
Continues to itch, updated on wait, Dr. Christy Gentles into room

## 2017-03-08 NOTE — ED Provider Notes (Signed)
[place] DEPT [facility] Provider Note   CSN: [id number] Arrival date & time: 03/08/17  0051     History   Chief Complaint Chief Complaint  Patient presents with  . Rash    HPI Dylan Barber. is a 41 y.o. male.  The history is provided by the patient.  Rash   This is a new problem. The current episode started 2 days ago. The problem has been gradually worsening. The problem is associated with a new detergent/soap. There has been no fever. The patient is experiencing no pain. The pain has been constant since onset. Associated symptoms include itching. He has tried antihistamines for the symptoms. The treatment provided no relief.   Patient presents with rash for past 2 days He reports new detergent but no other known exposures No new meds (he is compliant with HIV meds, no new changes) No insect/tick bites No fever/vomiting/shortness of breath No tongue swelling reported  Past Medical History:  Diagnosis Date  . Anemia   . Dialysis patient ([place])   . Heart murmur   . HIV disease ([place])   . Hypertension   . Lymphocytosis 06/16/2014  . Noncompliance 03/06/2015  . Post herpetic neuralgia 11/25/2016   Left V1 distribution  . Renal disorder   . Seizure [facility])     Patient Active Problem List   Diagnosis Date Noted  . Post herpetic neuralgia 11/25/2016  . Zoster ophthalmicus 08/09/2015  . Screening examination for venereal disease 04/06/2015  . Noncompliance 03/06/2015  . Parvovirus B19 infection 08/10/2014  . 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 Human immunodeficiency virus (HIV) disease ([place]) 03/28/2014  . Pulmonary edema 03/27/2014  . Muscle twitching 12/14/2013  . Internal and external bleeding hemorrhoids 08/26/2013  . Hypertension   . ESRD (end stage renal disease) on dialysis ([place]) 05/04/2013  . Normocytic anemia 05/04/2013    Past Surgical History:  Procedure Laterality Date  . AV FISTULA PLACEMENT Left 05/07/2013   Procedure:  ARTERIOVENOUS (AV) FISTULA CREATION- LEFT;  Surgeon: Rosetta Posner, MD;  Location: [place];  Service: Vascular;  Laterality: Left;  . AV FISTULA PLACEMENT Left 08/12/2013   Procedure: ARTERIOVENOUS (AV) FISTULA CREATION BRACHIOCEPHALIC;  Surgeon: Angelia Mould, MD;  Location: [place];  Service: Vascular;  Laterality: Left;  . INSERTION OF DIALYSIS CATHETER     x 2  . LIGATION OF ARTERIOVENOUS  FISTULA Left 08/12/2013   Procedure: LIGATION OF ARTERIOVENOUS  FISTULA RADIOCEPHALIC;  Surgeon: Angelia Mould, MD;  Location: [place];  Service: Vascular;  Laterality: Left;       Home Medications    Prior to Admission medications   Medication Sig Start Date End Date Taking? Authorizing Provider  abacavir (ZIAGEN) 300 MG tablet TAKE 2 TABLETS BY MOUTH DAILY. STOP ZIDOVUDINE, STOP PREZCOBIX 01/10/17   Thayer Headings, MD  carbamazepine (TEGRETOL) 200 MG tablet Take 1 tablet (200 mg total) by mouth 2 (two) times daily. 11/25/16   Kathrynn Ducking, MD  carvedilol (COREG) 6.25 MG tablet Take 1 tablet (6.25 mg total) by mouth 2 (two) times daily. 02/07/15   Jettie Booze, MD  EDURANT 25 MG TABS tablet TAKE 1 TABLET(25 MG) BY MOUTH DAILY WITH BREAKFAST 01/10/17   Thayer Headings, MD  gabapentin (NEURONTIN) 100 MG capsule  10/29/16   Historical Provider, MD  ibuprofen (ADVIL,MOTRIN) 200 MG tablet Take 800 mg by mouth every 6 (six) hours as needed for headache. Reported on 01/18/2016    Historical Provider,  MD  lamivudine (EPIVIR) 100 MG tablet Take 0.5 tablets (50 mg total) by mouth daily. 02/10/17   Thayer Headings, MD  predniSONE (DELTASONE) 50 MG tablet 1 tablet PO QD X4 days 03/08/17   Ripley Fraise, MD  TIVICAY 50 MG tablet TAKE 1 TABLET(50 MG) BY MOUTH DAILY 01/10/17   Thayer Headings, MD  valACYclovir (VALTREX) 500 MG tablet Take 500 mg by mouth daily. Reported on 01/18/2016    Historical Provider, MD    Family History Family History  Problem Relation Age of Onset  . Kidney failure Mother   . Colon  polyps Father   . Kidney disease Paternal Uncle   . Kidney disease Maternal Grandfather   . Kidney failure Maternal Uncle     Social History Social History  Substance Use Topics  . Smoking status: Never Smoker  . Smokeless tobacco: Never Used  . Alcohol use No     Allergies   Codeine; Eggs or egg-derived products; Heparin; Mercury; and Tomato   Review of Systems Review of Systems  Constitutional: Negative for fever.  Respiratory: Negative for shortness of breath.   Cardiovascular: Negative for chest pain.  Gastrointestinal: Negative for vomiting.  Skin: Positive for itching and rash.  All other systems reviewed and are negative.    Physical Exam Updated Vital Signs BP 109/74 (BP Location: Left Arm)   Pulse (!) 112   Temp 98.2 F (36.8 C) (Oral)   Resp 18   Ht 5\' 7"  (1.702 m)   Wt 88.5 kg   SpO2 98%   BMI 30.54 kg/m   Physical Exam CONSTITUTIONAL: Well developed/well nourished HEAD: Normocephalic/atraumatic EYES: EOMI/PERRL ENMT: Mucous membranes moist, no angioedema noted, healed rash from previous zoster to left face (chronic) NECK: supple no meningeal signs SPINE/BACK:entire spine nontender CV: S1/S2 noted LUNGS: Lungs are clear to auscultation bilaterally, no apparent distress ABDOMEN: soft, nontender NEURO: Pt is awake/alert/appropriate, moves all extremitiesx4.  No facial droop.   EXTREMITIES: pulses normal/equal, full ROM, dialysis access to Left UE SKIN: warm, rash noted to back/chest/extremities.  Spares palms.  No uriticaria noted.  Erythematous rash noted PSYCH: no abnormalities of mood noted, alert and oriented to situation   ED Treatments / Results  Labs (all labs ordered are listed, but only abnormal results are displayed) Labs Reviewed - No data to display  EKG  EKG Interpretation None       Radiology No results found.  Procedures Procedures (including critical care time)  Medications Ordered in ED Medications    diphenhydrAMINE (BENADRYL) injection 50 mg (50 mg Intramuscular Given 03/08/17 0232)  dexamethasone (DECADRON) tablet 8 mg (8 mg Oral Given 03/08/17 0229)     Initial Impression / Assessment and Plan / ED Course  I have reviewed the triage vital signs and the nursing notes.   Pt stable/well appearing Concern for possible allergic rxn, but confined to skin He is in no distress/ using phone Will d/c home He will only start prednisone if no improvement in 48 hours We discussed return precautions  Final Clinical Impressions(s) / ED Diagnoses   Final diagnoses:  Rash    New Prescriptions New Prescriptions   PREDNISONE (DELTASONE) 50 MG TABLET    1 tablet PO QD X4 days     Ripley Fraise, MD 03/08/17 [phone]

## 2017-03-10 DIAGNOSIS — B20 Human immunodeficiency virus [HIV] disease: Secondary | ICD-10-CM | POA: Diagnosis not present

## 2017-03-10 DIAGNOSIS — K7689 Other specified diseases of liver: Secondary | ICD-10-CM | POA: Diagnosis not present

## 2017-03-10 DIAGNOSIS — Z992 Dependence on renal dialysis: Secondary | ICD-10-CM | POA: Diagnosis not present

## 2017-03-10 DIAGNOSIS — Z4931 Encounter for adequacy testing for hemodialysis: Secondary | ICD-10-CM | POA: Diagnosis not present

## 2017-03-10 DIAGNOSIS — N2581 Secondary hyperparathyroidism of renal origin: Secondary | ICD-10-CM | POA: Diagnosis not present

## 2017-03-10 DIAGNOSIS — N186 End stage renal disease: Secondary | ICD-10-CM | POA: Diagnosis not present

## 2017-03-10 DIAGNOSIS — D513 Other dietary vitamin B12 deficiency anemia: Secondary | ICD-10-CM | POA: Diagnosis not present

## 2017-03-10 DIAGNOSIS — D509 Iron deficiency anemia, unspecified: Secondary | ICD-10-CM | POA: Diagnosis not present

## 2017-03-11 DIAGNOSIS — D631 Anemia in chronic kidney disease: Secondary | ICD-10-CM | POA: Diagnosis not present

## 2017-03-11 DIAGNOSIS — Z79899 Other long term (current) drug therapy: Secondary | ICD-10-CM | POA: Diagnosis not present

## 2017-03-11 DIAGNOSIS — N186 End stage renal disease: Secondary | ICD-10-CM | POA: Diagnosis not present

## 2017-03-11 DIAGNOSIS — N2581 Secondary hyperparathyroidism of renal origin: Secondary | ICD-10-CM | POA: Diagnosis not present

## 2017-03-11 DIAGNOSIS — D509 Iron deficiency anemia, unspecified: Secondary | ICD-10-CM | POA: Diagnosis not present

## 2017-03-11 DIAGNOSIS — Z4931 Encounter for adequacy testing for hemodialysis: Secondary | ICD-10-CM | POA: Diagnosis not present

## 2017-03-11 DIAGNOSIS — E440 Moderate protein-calorie malnutrition: Secondary | ICD-10-CM | POA: Diagnosis not present

## 2017-03-11 DIAGNOSIS — R17 Unspecified jaundice: Secondary | ICD-10-CM | POA: Diagnosis not present

## 2017-03-13 DIAGNOSIS — Z4931 Encounter for adequacy testing for hemodialysis: Secondary | ICD-10-CM | POA: Diagnosis not present

## 2017-03-13 DIAGNOSIS — D509 Iron deficiency anemia, unspecified: Secondary | ICD-10-CM | POA: Diagnosis not present

## 2017-03-13 DIAGNOSIS — E440 Moderate protein-calorie malnutrition: Secondary | ICD-10-CM | POA: Diagnosis not present

## 2017-03-13 DIAGNOSIS — N186 End stage renal disease: Secondary | ICD-10-CM | POA: Diagnosis not present

## 2017-03-13 DIAGNOSIS — Z79899 Other long term (current) drug therapy: Secondary | ICD-10-CM | POA: Diagnosis not present

## 2017-03-13 DIAGNOSIS — R17 Unspecified jaundice: Secondary | ICD-10-CM | POA: Diagnosis not present

## 2017-03-14 DIAGNOSIS — D509 Iron deficiency anemia, unspecified: Secondary | ICD-10-CM | POA: Diagnosis not present

## 2017-03-14 DIAGNOSIS — E440 Moderate protein-calorie malnutrition: Secondary | ICD-10-CM | POA: Diagnosis not present

## 2017-03-14 DIAGNOSIS — Z4931 Encounter for adequacy testing for hemodialysis: Secondary | ICD-10-CM | POA: Diagnosis not present

## 2017-03-14 DIAGNOSIS — R17 Unspecified jaundice: Secondary | ICD-10-CM | POA: Diagnosis not present

## 2017-03-14 DIAGNOSIS — N186 End stage renal disease: Secondary | ICD-10-CM | POA: Diagnosis not present

## 2017-03-14 DIAGNOSIS — Z79899 Other long term (current) drug therapy: Secondary | ICD-10-CM | POA: Diagnosis not present

## 2017-03-16 ENCOUNTER — Other Ambulatory Visit: Payer: Self-pay | Admitting: Neurology

## 2017-03-17 ENCOUNTER — Other Ambulatory Visit: Payer: Self-pay | Admitting: Neurology

## 2017-03-17 DIAGNOSIS — D509 Iron deficiency anemia, unspecified: Secondary | ICD-10-CM | POA: Diagnosis not present

## 2017-03-17 DIAGNOSIS — Z79899 Other long term (current) drug therapy: Secondary | ICD-10-CM | POA: Diagnosis not present

## 2017-03-17 DIAGNOSIS — E440 Moderate protein-calorie malnutrition: Secondary | ICD-10-CM | POA: Diagnosis not present

## 2017-03-17 DIAGNOSIS — N186 End stage renal disease: Secondary | ICD-10-CM | POA: Diagnosis not present

## 2017-03-17 DIAGNOSIS — Z4931 Encounter for adequacy testing for hemodialysis: Secondary | ICD-10-CM | POA: Diagnosis not present

## 2017-03-17 DIAGNOSIS — R17 Unspecified jaundice: Secondary | ICD-10-CM | POA: Diagnosis not present

## 2017-03-17 NOTE — Telephone Encounter (Signed)
Faxed printed/signed rx f carbamazepine for 90 day supply as requested. Fax: [phone]. Received confirmation.

## 2017-03-18 DIAGNOSIS — N186 End stage renal disease: Secondary | ICD-10-CM | POA: Diagnosis not present

## 2017-03-18 DIAGNOSIS — E440 Moderate protein-calorie malnutrition: Secondary | ICD-10-CM | POA: Diagnosis not present

## 2017-03-18 DIAGNOSIS — D509 Iron deficiency anemia, unspecified: Secondary | ICD-10-CM | POA: Diagnosis not present

## 2017-03-18 DIAGNOSIS — Z79899 Other long term (current) drug therapy: Secondary | ICD-10-CM | POA: Diagnosis not present

## 2017-03-18 DIAGNOSIS — Z4931 Encounter for adequacy testing for hemodialysis: Secondary | ICD-10-CM | POA: Diagnosis not present

## 2017-03-18 DIAGNOSIS — R17 Unspecified jaundice: Secondary | ICD-10-CM | POA: Diagnosis not present

## 2017-03-19 DIAGNOSIS — N186 End stage renal disease: Secondary | ICD-10-CM | POA: Diagnosis not present

## 2017-03-19 DIAGNOSIS — D509 Iron deficiency anemia, unspecified: Secondary | ICD-10-CM | POA: Diagnosis not present

## 2017-03-19 DIAGNOSIS — Z79899 Other long term (current) drug therapy: Secondary | ICD-10-CM | POA: Diagnosis not present

## 2017-03-19 DIAGNOSIS — E440 Moderate protein-calorie malnutrition: Secondary | ICD-10-CM | POA: Diagnosis not present

## 2017-03-19 DIAGNOSIS — R17 Unspecified jaundice: Secondary | ICD-10-CM | POA: Diagnosis not present

## 2017-03-19 DIAGNOSIS — Z4931 Encounter for adequacy testing for hemodialysis: Secondary | ICD-10-CM | POA: Diagnosis not present

## 2017-03-20 DIAGNOSIS — Z79899 Other long term (current) drug therapy: Secondary | ICD-10-CM | POA: Diagnosis not present

## 2017-03-20 DIAGNOSIS — E440 Moderate protein-calorie malnutrition: Secondary | ICD-10-CM | POA: Diagnosis not present

## 2017-03-20 DIAGNOSIS — D509 Iron deficiency anemia, unspecified: Secondary | ICD-10-CM | POA: Diagnosis not present

## 2017-03-20 DIAGNOSIS — R17 Unspecified jaundice: Secondary | ICD-10-CM | POA: Diagnosis not present

## 2017-03-20 DIAGNOSIS — N186 End stage renal disease: Secondary | ICD-10-CM | POA: Diagnosis not present

## 2017-03-20 DIAGNOSIS — Z4931 Encounter for adequacy testing for hemodialysis: Secondary | ICD-10-CM | POA: Diagnosis not present

## 2017-03-21 DIAGNOSIS — Z4931 Encounter for adequacy testing for hemodialysis: Secondary | ICD-10-CM | POA: Diagnosis not present

## 2017-03-21 DIAGNOSIS — Z992 Dependence on renal dialysis: Secondary | ICD-10-CM | POA: Diagnosis not present

## 2017-03-21 DIAGNOSIS — B20 Human immunodeficiency virus [HIV] disease: Secondary | ICD-10-CM | POA: Diagnosis not present

## 2017-03-21 DIAGNOSIS — R17 Unspecified jaundice: Secondary | ICD-10-CM | POA: Diagnosis not present

## 2017-03-21 DIAGNOSIS — D509 Iron deficiency anemia, unspecified: Secondary | ICD-10-CM | POA: Diagnosis not present

## 2017-03-21 DIAGNOSIS — Z79899 Other long term (current) drug therapy: Secondary | ICD-10-CM | POA: Diagnosis not present

## 2017-03-21 DIAGNOSIS — N186 End stage renal disease: Secondary | ICD-10-CM | POA: Diagnosis not present

## 2017-03-21 DIAGNOSIS — E440 Moderate protein-calorie malnutrition: Secondary | ICD-10-CM | POA: Diagnosis not present

## 2017-03-24 DIAGNOSIS — N186 End stage renal disease: Secondary | ICD-10-CM | POA: Diagnosis not present

## 2017-03-24 DIAGNOSIS — E8779 Other fluid overload: Secondary | ICD-10-CM | POA: Diagnosis not present

## 2017-03-24 DIAGNOSIS — N2581 Secondary hyperparathyroidism of renal origin: Secondary | ICD-10-CM | POA: Diagnosis not present

## 2017-03-25 DIAGNOSIS — E8779 Other fluid overload: Secondary | ICD-10-CM | POA: Diagnosis not present

## 2017-03-25 DIAGNOSIS — N186 End stage renal disease: Secondary | ICD-10-CM | POA: Diagnosis not present

## 2017-03-25 DIAGNOSIS — N2581 Secondary hyperparathyroidism of renal origin: Secondary | ICD-10-CM | POA: Diagnosis not present

## 2017-03-27 DIAGNOSIS — N2581 Secondary hyperparathyroidism of renal origin: Secondary | ICD-10-CM | POA: Diagnosis not present

## 2017-03-27 DIAGNOSIS — N186 End stage renal disease: Secondary | ICD-10-CM | POA: Diagnosis not present

## 2017-03-27 DIAGNOSIS — E8779 Other fluid overload: Secondary | ICD-10-CM | POA: Diagnosis not present

## 2017-03-28 DIAGNOSIS — E8779 Other fluid overload: Secondary | ICD-10-CM | POA: Diagnosis not present

## 2017-03-28 DIAGNOSIS — N186 End stage renal disease: Secondary | ICD-10-CM | POA: Diagnosis not present

## 2017-03-28 DIAGNOSIS — N2581 Secondary hyperparathyroidism of renal origin: Secondary | ICD-10-CM | POA: Diagnosis not present

## 2017-03-31 DIAGNOSIS — N186 End stage renal disease: Secondary | ICD-10-CM | POA: Diagnosis not present

## 2017-03-31 DIAGNOSIS — N2581 Secondary hyperparathyroidism of renal origin: Secondary | ICD-10-CM | POA: Diagnosis not present

## 2017-03-31 DIAGNOSIS — E8779 Other fluid overload: Secondary | ICD-10-CM | POA: Diagnosis not present

## 2017-04-01 DIAGNOSIS — N2581 Secondary hyperparathyroidism of renal origin: Secondary | ICD-10-CM | POA: Diagnosis not present

## 2017-04-01 DIAGNOSIS — N186 End stage renal disease: Secondary | ICD-10-CM | POA: Diagnosis not present

## 2017-04-01 DIAGNOSIS — E8779 Other fluid overload: Secondary | ICD-10-CM | POA: Diagnosis not present

## 2017-04-03 DIAGNOSIS — E8779 Other fluid overload: Secondary | ICD-10-CM | POA: Diagnosis not present

## 2017-04-03 DIAGNOSIS — N2581 Secondary hyperparathyroidism of renal origin: Secondary | ICD-10-CM | POA: Diagnosis not present

## 2017-04-03 DIAGNOSIS — N186 End stage renal disease: Secondary | ICD-10-CM | POA: Diagnosis not present

## 2017-04-04 DIAGNOSIS — N186 End stage renal disease: Secondary | ICD-10-CM | POA: Diagnosis not present

## 2017-04-04 DIAGNOSIS — N2581 Secondary hyperparathyroidism of renal origin: Secondary | ICD-10-CM | POA: Diagnosis not present

## 2017-04-04 DIAGNOSIS — E8779 Other fluid overload: Secondary | ICD-10-CM | POA: Diagnosis not present

## 2017-04-07 DIAGNOSIS — N2581 Secondary hyperparathyroidism of renal origin: Secondary | ICD-10-CM | POA: Diagnosis not present

## 2017-04-07 DIAGNOSIS — N186 End stage renal disease: Secondary | ICD-10-CM | POA: Diagnosis not present

## 2017-04-07 DIAGNOSIS — E8779 Other fluid overload: Secondary | ICD-10-CM | POA: Diagnosis not present

## 2017-04-08 DIAGNOSIS — E8779 Other fluid overload: Secondary | ICD-10-CM | POA: Diagnosis not present

## 2017-04-08 DIAGNOSIS — N186 End stage renal disease: Secondary | ICD-10-CM | POA: Diagnosis not present

## 2017-04-08 DIAGNOSIS — N2581 Secondary hyperparathyroidism of renal origin: Secondary | ICD-10-CM | POA: Diagnosis not present

## 2017-04-10 DIAGNOSIS — N2581 Secondary hyperparathyroidism of renal origin: Secondary | ICD-10-CM | POA: Diagnosis not present

## 2017-04-10 DIAGNOSIS — E8779 Other fluid overload: Secondary | ICD-10-CM | POA: Diagnosis not present

## 2017-04-10 DIAGNOSIS — N186 End stage renal disease: Secondary | ICD-10-CM | POA: Diagnosis not present

## 2017-04-10 DIAGNOSIS — B20 Human immunodeficiency virus [HIV] disease: Secondary | ICD-10-CM | POA: Diagnosis not present

## 2017-04-10 DIAGNOSIS — Z992 Dependence on renal dialysis: Secondary | ICD-10-CM | POA: Diagnosis not present

## 2017-04-11 DIAGNOSIS — D631 Anemia in chronic kidney disease: Secondary | ICD-10-CM | POA: Diagnosis not present

## 2017-04-11 DIAGNOSIS — E8779 Other fluid overload: Secondary | ICD-10-CM | POA: Diagnosis not present

## 2017-04-11 DIAGNOSIS — Z4931 Encounter for adequacy testing for hemodialysis: Secondary | ICD-10-CM | POA: Diagnosis not present

## 2017-04-11 DIAGNOSIS — N2581 Secondary hyperparathyroidism of renal origin: Secondary | ICD-10-CM | POA: Diagnosis not present

## 2017-04-11 DIAGNOSIS — Z4932 Encounter for adequacy testing for peritoneal dialysis: Secondary | ICD-10-CM | POA: Diagnosis not present

## 2017-04-11 DIAGNOSIS — N186 End stage renal disease: Secondary | ICD-10-CM | POA: Diagnosis not present

## 2017-04-11 DIAGNOSIS — Z79899 Other long term (current) drug therapy: Secondary | ICD-10-CM | POA: Diagnosis not present

## 2017-04-11 DIAGNOSIS — D509 Iron deficiency anemia, unspecified: Secondary | ICD-10-CM | POA: Diagnosis not present

## 2017-04-11 DIAGNOSIS — E440 Moderate protein-calorie malnutrition: Secondary | ICD-10-CM | POA: Diagnosis not present

## 2017-04-11 DIAGNOSIS — R17 Unspecified jaundice: Secondary | ICD-10-CM | POA: Diagnosis not present

## 2017-04-14 DIAGNOSIS — Z4931 Encounter for adequacy testing for hemodialysis: Secondary | ICD-10-CM | POA: Diagnosis not present

## 2017-04-14 DIAGNOSIS — N186 End stage renal disease: Secondary | ICD-10-CM | POA: Diagnosis not present

## 2017-04-14 DIAGNOSIS — D509 Iron deficiency anemia, unspecified: Secondary | ICD-10-CM | POA: Diagnosis not present

## 2017-04-14 DIAGNOSIS — R17 Unspecified jaundice: Secondary | ICD-10-CM | POA: Diagnosis not present

## 2017-04-14 DIAGNOSIS — Z79899 Other long term (current) drug therapy: Secondary | ICD-10-CM | POA: Diagnosis not present

## 2017-04-14 DIAGNOSIS — E440 Moderate protein-calorie malnutrition: Secondary | ICD-10-CM | POA: Diagnosis not present

## 2017-04-15 DIAGNOSIS — Z4931 Encounter for adequacy testing for hemodialysis: Secondary | ICD-10-CM | POA: Diagnosis not present

## 2017-04-15 DIAGNOSIS — Z79899 Other long term (current) drug therapy: Secondary | ICD-10-CM | POA: Diagnosis not present

## 2017-04-15 DIAGNOSIS — R17 Unspecified jaundice: Secondary | ICD-10-CM | POA: Diagnosis not present

## 2017-04-15 DIAGNOSIS — E440 Moderate protein-calorie malnutrition: Secondary | ICD-10-CM | POA: Diagnosis not present

## 2017-04-15 DIAGNOSIS — D509 Iron deficiency anemia, unspecified: Secondary | ICD-10-CM | POA: Diagnosis not present

## 2017-04-15 DIAGNOSIS — N186 End stage renal disease: Secondary | ICD-10-CM | POA: Diagnosis not present

## 2017-04-17 DIAGNOSIS — Z79899 Other long term (current) drug therapy: Secondary | ICD-10-CM | POA: Diagnosis not present

## 2017-04-17 DIAGNOSIS — R17 Unspecified jaundice: Secondary | ICD-10-CM | POA: Diagnosis not present

## 2017-04-17 DIAGNOSIS — D509 Iron deficiency anemia, unspecified: Secondary | ICD-10-CM | POA: Diagnosis not present

## 2017-04-17 DIAGNOSIS — Z4931 Encounter for adequacy testing for hemodialysis: Secondary | ICD-10-CM | POA: Diagnosis not present

## 2017-04-17 DIAGNOSIS — E440 Moderate protein-calorie malnutrition: Secondary | ICD-10-CM | POA: Diagnosis not present

## 2017-04-17 DIAGNOSIS — N186 End stage renal disease: Secondary | ICD-10-CM | POA: Diagnosis not present

## 2017-04-18 DIAGNOSIS — Z79899 Other long term (current) drug therapy: Secondary | ICD-10-CM | POA: Diagnosis not present

## 2017-04-18 DIAGNOSIS — D509 Iron deficiency anemia, unspecified: Secondary | ICD-10-CM | POA: Diagnosis not present

## 2017-04-18 DIAGNOSIS — R17 Unspecified jaundice: Secondary | ICD-10-CM | POA: Diagnosis not present

## 2017-04-18 DIAGNOSIS — E440 Moderate protein-calorie malnutrition: Secondary | ICD-10-CM | POA: Diagnosis not present

## 2017-04-18 DIAGNOSIS — N186 End stage renal disease: Secondary | ICD-10-CM | POA: Diagnosis not present

## 2017-04-18 DIAGNOSIS — Z4931 Encounter for adequacy testing for hemodialysis: Secondary | ICD-10-CM | POA: Diagnosis not present

## 2017-04-21 DIAGNOSIS — R17 Unspecified jaundice: Secondary | ICD-10-CM | POA: Diagnosis not present

## 2017-04-21 DIAGNOSIS — E440 Moderate protein-calorie malnutrition: Secondary | ICD-10-CM | POA: Diagnosis not present

## 2017-04-21 DIAGNOSIS — N186 End stage renal disease: Secondary | ICD-10-CM | POA: Diagnosis not present

## 2017-04-21 DIAGNOSIS — Z4931 Encounter for adequacy testing for hemodialysis: Secondary | ICD-10-CM | POA: Diagnosis not present

## 2017-04-21 DIAGNOSIS — Z79899 Other long term (current) drug therapy: Secondary | ICD-10-CM | POA: Diagnosis not present

## 2017-04-21 DIAGNOSIS — D509 Iron deficiency anemia, unspecified: Secondary | ICD-10-CM | POA: Diagnosis not present

## 2017-04-22 DIAGNOSIS — D509 Iron deficiency anemia, unspecified: Secondary | ICD-10-CM | POA: Diagnosis not present

## 2017-04-22 DIAGNOSIS — N186 End stage renal disease: Secondary | ICD-10-CM | POA: Diagnosis not present

## 2017-04-22 DIAGNOSIS — R17 Unspecified jaundice: Secondary | ICD-10-CM | POA: Diagnosis not present

## 2017-04-22 DIAGNOSIS — Z4931 Encounter for adequacy testing for hemodialysis: Secondary | ICD-10-CM | POA: Diagnosis not present

## 2017-04-22 DIAGNOSIS — Z79899 Other long term (current) drug therapy: Secondary | ICD-10-CM | POA: Diagnosis not present

## 2017-04-22 DIAGNOSIS — E440 Moderate protein-calorie malnutrition: Secondary | ICD-10-CM | POA: Diagnosis not present

## 2017-04-23 DIAGNOSIS — R17 Unspecified jaundice: Secondary | ICD-10-CM | POA: Diagnosis not present

## 2017-04-23 DIAGNOSIS — E440 Moderate protein-calorie malnutrition: Secondary | ICD-10-CM | POA: Diagnosis not present

## 2017-04-23 DIAGNOSIS — D509 Iron deficiency anemia, unspecified: Secondary | ICD-10-CM | POA: Diagnosis not present

## 2017-04-23 DIAGNOSIS — Z79899 Other long term (current) drug therapy: Secondary | ICD-10-CM | POA: Diagnosis not present

## 2017-04-23 DIAGNOSIS — N186 End stage renal disease: Secondary | ICD-10-CM | POA: Diagnosis not present

## 2017-04-23 DIAGNOSIS — Z4931 Encounter for adequacy testing for hemodialysis: Secondary | ICD-10-CM | POA: Diagnosis not present

## 2017-04-24 DIAGNOSIS — Z79899 Other long term (current) drug therapy: Secondary | ICD-10-CM | POA: Diagnosis not present

## 2017-04-24 DIAGNOSIS — R17 Unspecified jaundice: Secondary | ICD-10-CM | POA: Diagnosis not present

## 2017-04-24 DIAGNOSIS — N186 End stage renal disease: Secondary | ICD-10-CM | POA: Diagnosis not present

## 2017-04-24 DIAGNOSIS — Z4931 Encounter for adequacy testing for hemodialysis: Secondary | ICD-10-CM | POA: Diagnosis not present

## 2017-04-24 DIAGNOSIS — E440 Moderate protein-calorie malnutrition: Secondary | ICD-10-CM | POA: Diagnosis not present

## 2017-04-24 DIAGNOSIS — D509 Iron deficiency anemia, unspecified: Secondary | ICD-10-CM | POA: Diagnosis not present

## 2017-04-25 DIAGNOSIS — D509 Iron deficiency anemia, unspecified: Secondary | ICD-10-CM | POA: Diagnosis not present

## 2017-04-25 DIAGNOSIS — N186 End stage renal disease: Secondary | ICD-10-CM | POA: Diagnosis not present

## 2017-04-25 DIAGNOSIS — E440 Moderate protein-calorie malnutrition: Secondary | ICD-10-CM | POA: Diagnosis not present

## 2017-04-25 DIAGNOSIS — Z79899 Other long term (current) drug therapy: Secondary | ICD-10-CM | POA: Diagnosis not present

## 2017-04-25 DIAGNOSIS — R17 Unspecified jaundice: Secondary | ICD-10-CM | POA: Diagnosis not present

## 2017-04-25 DIAGNOSIS — Z4931 Encounter for adequacy testing for hemodialysis: Secondary | ICD-10-CM | POA: Diagnosis not present

## 2017-04-28 DIAGNOSIS — R17 Unspecified jaundice: Secondary | ICD-10-CM | POA: Diagnosis not present

## 2017-04-28 DIAGNOSIS — N186 End stage renal disease: Secondary | ICD-10-CM | POA: Diagnosis not present

## 2017-04-28 DIAGNOSIS — Z4931 Encounter for adequacy testing for hemodialysis: Secondary | ICD-10-CM | POA: Diagnosis not present

## 2017-04-28 DIAGNOSIS — E440 Moderate protein-calorie malnutrition: Secondary | ICD-10-CM | POA: Diagnosis not present

## 2017-04-28 DIAGNOSIS — Z79899 Other long term (current) drug therapy: Secondary | ICD-10-CM | POA: Diagnosis not present

## 2017-04-28 DIAGNOSIS — D509 Iron deficiency anemia, unspecified: Secondary | ICD-10-CM | POA: Diagnosis not present

## 2017-04-30 DIAGNOSIS — D509 Iron deficiency anemia, unspecified: Secondary | ICD-10-CM | POA: Diagnosis not present

## 2017-04-30 DIAGNOSIS — R17 Unspecified jaundice: Secondary | ICD-10-CM | POA: Diagnosis not present

## 2017-04-30 DIAGNOSIS — N186 End stage renal disease: Secondary | ICD-10-CM | POA: Diagnosis not present

## 2017-04-30 DIAGNOSIS — Z4931 Encounter for adequacy testing for hemodialysis: Secondary | ICD-10-CM | POA: Diagnosis not present

## 2017-04-30 DIAGNOSIS — Z79899 Other long term (current) drug therapy: Secondary | ICD-10-CM | POA: Diagnosis not present

## 2017-04-30 DIAGNOSIS — E440 Moderate protein-calorie malnutrition: Secondary | ICD-10-CM | POA: Diagnosis not present

## 2017-05-01 DIAGNOSIS — D509 Iron deficiency anemia, unspecified: Secondary | ICD-10-CM | POA: Diagnosis not present

## 2017-05-01 DIAGNOSIS — Z79899 Other long term (current) drug therapy: Secondary | ICD-10-CM | POA: Diagnosis not present

## 2017-05-01 DIAGNOSIS — E440 Moderate protein-calorie malnutrition: Secondary | ICD-10-CM | POA: Diagnosis not present

## 2017-05-01 DIAGNOSIS — R17 Unspecified jaundice: Secondary | ICD-10-CM | POA: Diagnosis not present

## 2017-05-01 DIAGNOSIS — Z4931 Encounter for adequacy testing for hemodialysis: Secondary | ICD-10-CM | POA: Diagnosis not present

## 2017-05-01 DIAGNOSIS — N186 End stage renal disease: Secondary | ICD-10-CM | POA: Diagnosis not present

## 2017-05-02 DIAGNOSIS — N186 End stage renal disease: Secondary | ICD-10-CM | POA: Diagnosis not present

## 2017-05-02 DIAGNOSIS — D509 Iron deficiency anemia, unspecified: Secondary | ICD-10-CM | POA: Diagnosis not present

## 2017-05-02 DIAGNOSIS — Z4931 Encounter for adequacy testing for hemodialysis: Secondary | ICD-10-CM | POA: Diagnosis not present

## 2017-05-02 DIAGNOSIS — R17 Unspecified jaundice: Secondary | ICD-10-CM | POA: Diagnosis not present

## 2017-05-02 DIAGNOSIS — Z79899 Other long term (current) drug therapy: Secondary | ICD-10-CM | POA: Diagnosis not present

## 2017-05-02 DIAGNOSIS — E440 Moderate protein-calorie malnutrition: Secondary | ICD-10-CM | POA: Diagnosis not present

## 2017-05-05 DIAGNOSIS — R17 Unspecified jaundice: Secondary | ICD-10-CM | POA: Diagnosis not present

## 2017-05-05 DIAGNOSIS — Z4931 Encounter for adequacy testing for hemodialysis: Secondary | ICD-10-CM | POA: Diagnosis not present

## 2017-05-05 DIAGNOSIS — E440 Moderate protein-calorie malnutrition: Secondary | ICD-10-CM | POA: Diagnosis not present

## 2017-05-05 DIAGNOSIS — N186 End stage renal disease: Secondary | ICD-10-CM | POA: Diagnosis not present

## 2017-05-05 DIAGNOSIS — Z79899 Other long term (current) drug therapy: Secondary | ICD-10-CM | POA: Diagnosis not present

## 2017-05-05 DIAGNOSIS — D509 Iron deficiency anemia, unspecified: Secondary | ICD-10-CM | POA: Diagnosis not present

## 2017-05-07 DIAGNOSIS — E440 Moderate protein-calorie malnutrition: Secondary | ICD-10-CM | POA: Diagnosis not present

## 2017-05-07 DIAGNOSIS — D509 Iron deficiency anemia, unspecified: Secondary | ICD-10-CM | POA: Diagnosis not present

## 2017-05-07 DIAGNOSIS — R17 Unspecified jaundice: Secondary | ICD-10-CM | POA: Diagnosis not present

## 2017-05-07 DIAGNOSIS — Z4931 Encounter for adequacy testing for hemodialysis: Secondary | ICD-10-CM | POA: Diagnosis not present

## 2017-05-07 DIAGNOSIS — Z79899 Other long term (current) drug therapy: Secondary | ICD-10-CM | POA: Diagnosis not present

## 2017-05-07 DIAGNOSIS — N186 End stage renal disease: Secondary | ICD-10-CM | POA: Diagnosis not present

## 2017-05-08 DIAGNOSIS — D509 Iron deficiency anemia, unspecified: Secondary | ICD-10-CM | POA: Diagnosis not present

## 2017-05-08 DIAGNOSIS — E440 Moderate protein-calorie malnutrition: Secondary | ICD-10-CM | POA: Diagnosis not present

## 2017-05-08 DIAGNOSIS — Z79899 Other long term (current) drug therapy: Secondary | ICD-10-CM | POA: Diagnosis not present

## 2017-05-08 DIAGNOSIS — R17 Unspecified jaundice: Secondary | ICD-10-CM | POA: Diagnosis not present

## 2017-05-08 DIAGNOSIS — N186 End stage renal disease: Secondary | ICD-10-CM | POA: Diagnosis not present

## 2017-05-08 DIAGNOSIS — Z4931 Encounter for adequacy testing for hemodialysis: Secondary | ICD-10-CM | POA: Diagnosis not present

## 2017-05-09 DIAGNOSIS — R17 Unspecified jaundice: Secondary | ICD-10-CM | POA: Diagnosis not present

## 2017-05-09 DIAGNOSIS — Z4931 Encounter for adequacy testing for hemodialysis: Secondary | ICD-10-CM | POA: Diagnosis not present

## 2017-05-09 DIAGNOSIS — D509 Iron deficiency anemia, unspecified: Secondary | ICD-10-CM | POA: Diagnosis not present

## 2017-05-09 DIAGNOSIS — N186 End stage renal disease: Secondary | ICD-10-CM | POA: Diagnosis not present

## 2017-05-09 DIAGNOSIS — E440 Moderate protein-calorie malnutrition: Secondary | ICD-10-CM | POA: Diagnosis not present

## 2017-05-09 DIAGNOSIS — Z79899 Other long term (current) drug therapy: Secondary | ICD-10-CM | POA: Diagnosis not present

## 2017-05-10 DIAGNOSIS — B20 Human immunodeficiency virus [HIV] disease: Secondary | ICD-10-CM | POA: Diagnosis not present

## 2017-05-10 DIAGNOSIS — N186 End stage renal disease: Secondary | ICD-10-CM | POA: Diagnosis not present

## 2017-05-10 DIAGNOSIS — Z992 Dependence on renal dialysis: Secondary | ICD-10-CM | POA: Diagnosis not present

## 2017-05-12 ENCOUNTER — Other Ambulatory Visit: Payer: Self-pay | Admitting: Internal Medicine

## 2017-05-12 DIAGNOSIS — B20 Human immunodeficiency virus [HIV] disease: Secondary | ICD-10-CM

## 2017-05-12 MED ORDER — LAMIVUDINE 100 MG PO TABS: 50.0000 mg | ORAL_TABLET | Freq: Every day | ORAL | 0 refills | Status: DC

## 2017-05-12 MED ORDER — ABACAVIR SULFATE 300 MG PO TABS: ORAL_TABLET | ORAL | 0 refills | Status: DC

## 2017-05-12 MED ORDER — RILPIVIRINE HCL 25 MG PO TABS: ORAL_TABLET | ORAL | 0 refills | Status: DC

## 2017-05-13 DIAGNOSIS — N186 End stage renal disease: Secondary | ICD-10-CM | POA: Diagnosis not present

## 2017-05-13 DIAGNOSIS — E8779 Other fluid overload: Secondary | ICD-10-CM | POA: Diagnosis not present

## 2017-05-13 DIAGNOSIS — Z4931 Encounter for adequacy testing for hemodialysis: Secondary | ICD-10-CM | POA: Diagnosis not present

## 2017-05-13 DIAGNOSIS — N2589 Other disorders resulting from impaired renal tubular function: Secondary | ICD-10-CM | POA: Diagnosis not present

## 2017-05-13 DIAGNOSIS — D509 Iron deficiency anemia, unspecified: Secondary | ICD-10-CM | POA: Diagnosis not present

## 2017-05-13 DIAGNOSIS — N2581 Secondary hyperparathyroidism of renal origin: Secondary | ICD-10-CM | POA: Diagnosis not present

## 2017-05-13 DIAGNOSIS — D631 Anemia in chronic kidney disease: Secondary | ICD-10-CM | POA: Diagnosis not present

## 2017-05-13 DIAGNOSIS — Z4932 Encounter for adequacy testing for peritoneal dialysis: Secondary | ICD-10-CM | POA: Diagnosis not present

## 2017-05-16 DIAGNOSIS — N186 End stage renal disease: Secondary | ICD-10-CM | POA: Diagnosis not present

## 2017-05-16 DIAGNOSIS — D631 Anemia in chronic kidney disease: Secondary | ICD-10-CM | POA: Diagnosis not present

## 2017-05-16 DIAGNOSIS — Z4932 Encounter for adequacy testing for peritoneal dialysis: Secondary | ICD-10-CM | POA: Diagnosis not present

## 2017-05-16 DIAGNOSIS — D509 Iron deficiency anemia, unspecified: Secondary | ICD-10-CM | POA: Diagnosis not present

## 2017-05-16 DIAGNOSIS — N2589 Other disorders resulting from impaired renal tubular function: Secondary | ICD-10-CM | POA: Diagnosis not present

## 2017-05-16 DIAGNOSIS — N2581 Secondary hyperparathyroidism of renal origin: Secondary | ICD-10-CM | POA: Diagnosis not present

## 2017-05-18 DIAGNOSIS — D509 Iron deficiency anemia, unspecified: Secondary | ICD-10-CM | POA: Diagnosis not present

## 2017-05-18 DIAGNOSIS — N186 End stage renal disease: Secondary | ICD-10-CM | POA: Diagnosis not present

## 2017-05-18 DIAGNOSIS — N2581 Secondary hyperparathyroidism of renal origin: Secondary | ICD-10-CM | POA: Diagnosis not present

## 2017-05-18 DIAGNOSIS — N2589 Other disorders resulting from impaired renal tubular function: Secondary | ICD-10-CM | POA: Diagnosis not present

## 2017-05-18 DIAGNOSIS — Z4932 Encounter for adequacy testing for peritoneal dialysis: Secondary | ICD-10-CM | POA: Diagnosis not present

## 2017-05-18 DIAGNOSIS — D631 Anemia in chronic kidney disease: Secondary | ICD-10-CM | POA: Diagnosis not present

## 2017-05-19 DIAGNOSIS — Z4932 Encounter for adequacy testing for peritoneal dialysis: Secondary | ICD-10-CM | POA: Diagnosis not present

## 2017-05-19 DIAGNOSIS — N2581 Secondary hyperparathyroidism of renal origin: Secondary | ICD-10-CM | POA: Diagnosis not present

## 2017-05-19 DIAGNOSIS — D509 Iron deficiency anemia, unspecified: Secondary | ICD-10-CM | POA: Diagnosis not present

## 2017-05-19 DIAGNOSIS — N186 End stage renal disease: Secondary | ICD-10-CM | POA: Diagnosis not present

## 2017-05-19 DIAGNOSIS — E784 Other hyperlipidemia: Secondary | ICD-10-CM | POA: Diagnosis not present

## 2017-05-19 DIAGNOSIS — D631 Anemia in chronic kidney disease: Secondary | ICD-10-CM | POA: Diagnosis not present

## 2017-05-19 DIAGNOSIS — N2589 Other disorders resulting from impaired renal tubular function: Secondary | ICD-10-CM | POA: Diagnosis not present

## 2017-05-21 DIAGNOSIS — D509 Iron deficiency anemia, unspecified: Secondary | ICD-10-CM | POA: Diagnosis not present

## 2017-05-21 DIAGNOSIS — Z4932 Encounter for adequacy testing for peritoneal dialysis: Secondary | ICD-10-CM | POA: Diagnosis not present

## 2017-05-21 DIAGNOSIS — N2581 Secondary hyperparathyroidism of renal origin: Secondary | ICD-10-CM | POA: Diagnosis not present

## 2017-05-21 DIAGNOSIS — N2589 Other disorders resulting from impaired renal tubular function: Secondary | ICD-10-CM | POA: Diagnosis not present

## 2017-05-21 DIAGNOSIS — N186 End stage renal disease: Secondary | ICD-10-CM | POA: Diagnosis not present

## 2017-05-21 DIAGNOSIS — D631 Anemia in chronic kidney disease: Secondary | ICD-10-CM | POA: Diagnosis not present

## 2017-05-23 DIAGNOSIS — N2581 Secondary hyperparathyroidism of renal origin: Secondary | ICD-10-CM | POA: Diagnosis not present

## 2017-05-23 DIAGNOSIS — N186 End stage renal disease: Secondary | ICD-10-CM | POA: Diagnosis not present

## 2017-05-23 DIAGNOSIS — D631 Anemia in chronic kidney disease: Secondary | ICD-10-CM | POA: Diagnosis not present

## 2017-05-23 DIAGNOSIS — D509 Iron deficiency anemia, unspecified: Secondary | ICD-10-CM | POA: Diagnosis not present

## 2017-05-23 DIAGNOSIS — N2589 Other disorders resulting from impaired renal tubular function: Secondary | ICD-10-CM | POA: Diagnosis not present

## 2017-05-23 DIAGNOSIS — Z4932 Encounter for adequacy testing for peritoneal dialysis: Secondary | ICD-10-CM | POA: Diagnosis not present

## 2017-05-25 DIAGNOSIS — N186 End stage renal disease: Secondary | ICD-10-CM | POA: Diagnosis not present

## 2017-05-25 DIAGNOSIS — N2581 Secondary hyperparathyroidism of renal origin: Secondary | ICD-10-CM | POA: Diagnosis not present

## 2017-05-25 DIAGNOSIS — N2589 Other disorders resulting from impaired renal tubular function: Secondary | ICD-10-CM | POA: Diagnosis not present

## 2017-05-25 DIAGNOSIS — D509 Iron deficiency anemia, unspecified: Secondary | ICD-10-CM | POA: Diagnosis not present

## 2017-05-25 DIAGNOSIS — D631 Anemia in chronic kidney disease: Secondary | ICD-10-CM | POA: Diagnosis not present

## 2017-05-25 DIAGNOSIS — Z4932 Encounter for adequacy testing for peritoneal dialysis: Secondary | ICD-10-CM | POA: Diagnosis not present

## 2017-05-26 DIAGNOSIS — D631 Anemia in chronic kidney disease: Secondary | ICD-10-CM | POA: Diagnosis not present

## 2017-05-26 DIAGNOSIS — N186 End stage renal disease: Secondary | ICD-10-CM | POA: Diagnosis not present

## 2017-05-26 DIAGNOSIS — D509 Iron deficiency anemia, unspecified: Secondary | ICD-10-CM | POA: Diagnosis not present

## 2017-05-26 DIAGNOSIS — N2589 Other disorders resulting from impaired renal tubular function: Secondary | ICD-10-CM | POA: Diagnosis not present

## 2017-05-26 DIAGNOSIS — N2581 Secondary hyperparathyroidism of renal origin: Secondary | ICD-10-CM | POA: Diagnosis not present

## 2017-05-26 DIAGNOSIS — Z4932 Encounter for adequacy testing for peritoneal dialysis: Secondary | ICD-10-CM | POA: Diagnosis not present

## 2017-05-28 DIAGNOSIS — D509 Iron deficiency anemia, unspecified: Secondary | ICD-10-CM | POA: Diagnosis not present

## 2017-05-28 DIAGNOSIS — N2589 Other disorders resulting from impaired renal tubular function: Secondary | ICD-10-CM | POA: Diagnosis not present

## 2017-05-28 DIAGNOSIS — N186 End stage renal disease: Secondary | ICD-10-CM | POA: Diagnosis not present

## 2017-05-28 DIAGNOSIS — Z4932 Encounter for adequacy testing for peritoneal dialysis: Secondary | ICD-10-CM | POA: Diagnosis not present

## 2017-05-28 DIAGNOSIS — D631 Anemia in chronic kidney disease: Secondary | ICD-10-CM | POA: Diagnosis not present

## 2017-05-28 DIAGNOSIS — N2581 Secondary hyperparathyroidism of renal origin: Secondary | ICD-10-CM | POA: Diagnosis not present

## 2017-05-29 DIAGNOSIS — D509 Iron deficiency anemia, unspecified: Secondary | ICD-10-CM | POA: Diagnosis not present

## 2017-05-29 DIAGNOSIS — D631 Anemia in chronic kidney disease: Secondary | ICD-10-CM | POA: Diagnosis not present

## 2017-05-29 DIAGNOSIS — N2589 Other disorders resulting from impaired renal tubular function: Secondary | ICD-10-CM | POA: Diagnosis not present

## 2017-05-29 DIAGNOSIS — N186 End stage renal disease: Secondary | ICD-10-CM | POA: Diagnosis not present

## 2017-05-29 DIAGNOSIS — Z4932 Encounter for adequacy testing for peritoneal dialysis: Secondary | ICD-10-CM | POA: Diagnosis not present

## 2017-05-29 DIAGNOSIS — N2581 Secondary hyperparathyroidism of renal origin: Secondary | ICD-10-CM | POA: Diagnosis not present

## 2017-05-30 DIAGNOSIS — Z4932 Encounter for adequacy testing for peritoneal dialysis: Secondary | ICD-10-CM | POA: Diagnosis not present

## 2017-05-30 DIAGNOSIS — D509 Iron deficiency anemia, unspecified: Secondary | ICD-10-CM | POA: Diagnosis not present

## 2017-05-30 DIAGNOSIS — N2581 Secondary hyperparathyroidism of renal origin: Secondary | ICD-10-CM | POA: Diagnosis not present

## 2017-05-30 DIAGNOSIS — N186 End stage renal disease: Secondary | ICD-10-CM | POA: Diagnosis not present

## 2017-05-30 DIAGNOSIS — N2589 Other disorders resulting from impaired renal tubular function: Secondary | ICD-10-CM | POA: Diagnosis not present

## 2017-05-30 DIAGNOSIS — D631 Anemia in chronic kidney disease: Secondary | ICD-10-CM | POA: Diagnosis not present

## 2017-06-02 DIAGNOSIS — N186 End stage renal disease: Secondary | ICD-10-CM | POA: Diagnosis not present

## 2017-06-02 DIAGNOSIS — D509 Iron deficiency anemia, unspecified: Secondary | ICD-10-CM | POA: Diagnosis not present

## 2017-06-02 DIAGNOSIS — D631 Anemia in chronic kidney disease: Secondary | ICD-10-CM | POA: Diagnosis not present

## 2017-06-02 DIAGNOSIS — N2589 Other disorders resulting from impaired renal tubular function: Secondary | ICD-10-CM | POA: Diagnosis not present

## 2017-06-02 DIAGNOSIS — N2581 Secondary hyperparathyroidism of renal origin: Secondary | ICD-10-CM | POA: Diagnosis not present

## 2017-06-02 DIAGNOSIS — Z4932 Encounter for adequacy testing for peritoneal dialysis: Secondary | ICD-10-CM | POA: Diagnosis not present

## 2017-06-03 DIAGNOSIS — Z4932 Encounter for adequacy testing for peritoneal dialysis: Secondary | ICD-10-CM | POA: Diagnosis not present

## 2017-06-03 DIAGNOSIS — N2581 Secondary hyperparathyroidism of renal origin: Secondary | ICD-10-CM | POA: Diagnosis not present

## 2017-06-03 DIAGNOSIS — N186 End stage renal disease: Secondary | ICD-10-CM | POA: Diagnosis not present

## 2017-06-03 DIAGNOSIS — N2589 Other disorders resulting from impaired renal tubular function: Secondary | ICD-10-CM | POA: Diagnosis not present

## 2017-06-03 DIAGNOSIS — D631 Anemia in chronic kidney disease: Secondary | ICD-10-CM | POA: Diagnosis not present

## 2017-06-03 DIAGNOSIS — D509 Iron deficiency anemia, unspecified: Secondary | ICD-10-CM | POA: Diagnosis not present

## 2017-06-05 DIAGNOSIS — D631 Anemia in chronic kidney disease: Secondary | ICD-10-CM | POA: Diagnosis not present

## 2017-06-05 DIAGNOSIS — N2581 Secondary hyperparathyroidism of renal origin: Secondary | ICD-10-CM | POA: Diagnosis not present

## 2017-06-05 DIAGNOSIS — Z4932 Encounter for adequacy testing for peritoneal dialysis: Secondary | ICD-10-CM | POA: Diagnosis not present

## 2017-06-05 DIAGNOSIS — N186 End stage renal disease: Secondary | ICD-10-CM | POA: Diagnosis not present

## 2017-06-05 DIAGNOSIS — D509 Iron deficiency anemia, unspecified: Secondary | ICD-10-CM | POA: Diagnosis not present

## 2017-06-05 DIAGNOSIS — N2589 Other disorders resulting from impaired renal tubular function: Secondary | ICD-10-CM | POA: Diagnosis not present

## 2017-06-07 DIAGNOSIS — Z4932 Encounter for adequacy testing for peritoneal dialysis: Secondary | ICD-10-CM | POA: Diagnosis not present

## 2017-06-07 DIAGNOSIS — D509 Iron deficiency anemia, unspecified: Secondary | ICD-10-CM | POA: Diagnosis not present

## 2017-06-07 DIAGNOSIS — D631 Anemia in chronic kidney disease: Secondary | ICD-10-CM | POA: Diagnosis not present

## 2017-06-07 DIAGNOSIS — N2581 Secondary hyperparathyroidism of renal origin: Secondary | ICD-10-CM | POA: Diagnosis not present

## 2017-06-07 DIAGNOSIS — N186 End stage renal disease: Secondary | ICD-10-CM | POA: Diagnosis not present

## 2017-06-07 DIAGNOSIS — N2589 Other disorders resulting from impaired renal tubular function: Secondary | ICD-10-CM | POA: Diagnosis not present

## 2017-06-09 DIAGNOSIS — N2581 Secondary hyperparathyroidism of renal origin: Secondary | ICD-10-CM | POA: Diagnosis not present

## 2017-06-09 DIAGNOSIS — D509 Iron deficiency anemia, unspecified: Secondary | ICD-10-CM | POA: Diagnosis not present

## 2017-06-09 DIAGNOSIS — N186 End stage renal disease: Secondary | ICD-10-CM | POA: Diagnosis not present

## 2017-06-09 DIAGNOSIS — N2589 Other disorders resulting from impaired renal tubular function: Secondary | ICD-10-CM | POA: Diagnosis not present

## 2017-06-09 DIAGNOSIS — D631 Anemia in chronic kidney disease: Secondary | ICD-10-CM | POA: Diagnosis not present

## 2017-06-09 DIAGNOSIS — Z4932 Encounter for adequacy testing for peritoneal dialysis: Secondary | ICD-10-CM | POA: Diagnosis not present

## 2017-06-10 ENCOUNTER — Other Ambulatory Visit: Payer: Self-pay | Admitting: Internal Medicine

## 2017-06-10 DIAGNOSIS — D631 Anemia in chronic kidney disease: Secondary | ICD-10-CM | POA: Diagnosis not present

## 2017-06-10 DIAGNOSIS — Z4932 Encounter for adequacy testing for peritoneal dialysis: Secondary | ICD-10-CM | POA: Diagnosis not present

## 2017-06-10 DIAGNOSIS — N2589 Other disorders resulting from impaired renal tubular function: Secondary | ICD-10-CM | POA: Diagnosis not present

## 2017-06-10 DIAGNOSIS — D509 Iron deficiency anemia, unspecified: Secondary | ICD-10-CM | POA: Diagnosis not present

## 2017-06-10 DIAGNOSIS — B20 Human immunodeficiency virus [HIV] disease: Secondary | ICD-10-CM

## 2017-06-10 DIAGNOSIS — N186 End stage renal disease: Secondary | ICD-10-CM | POA: Diagnosis not present

## 2017-06-10 DIAGNOSIS — Z992 Dependence on renal dialysis: Secondary | ICD-10-CM | POA: Diagnosis not present

## 2017-06-10 DIAGNOSIS — N2581 Secondary hyperparathyroidism of renal origin: Secondary | ICD-10-CM | POA: Diagnosis not present

## 2017-06-11 DIAGNOSIS — Z452 Encounter for adjustment and management of vascular access device: Secondary | ICD-10-CM | POA: Diagnosis not present

## 2017-06-12 DIAGNOSIS — Z79899 Other long term (current) drug therapy: Secondary | ICD-10-CM | POA: Diagnosis not present

## 2017-06-12 DIAGNOSIS — D631 Anemia in chronic kidney disease: Secondary | ICD-10-CM | POA: Diagnosis not present

## 2017-06-12 DIAGNOSIS — N186 End stage renal disease: Secondary | ICD-10-CM | POA: Diagnosis not present

## 2017-06-12 DIAGNOSIS — Z4931 Encounter for adequacy testing for hemodialysis: Secondary | ICD-10-CM | POA: Diagnosis not present

## 2017-06-12 DIAGNOSIS — D509 Iron deficiency anemia, unspecified: Secondary | ICD-10-CM | POA: Diagnosis not present

## 2017-06-12 DIAGNOSIS — R17 Unspecified jaundice: Secondary | ICD-10-CM | POA: Diagnosis not present

## 2017-06-12 DIAGNOSIS — E440 Moderate protein-calorie malnutrition: Secondary | ICD-10-CM | POA: Diagnosis not present

## 2017-06-12 DIAGNOSIS — N2581 Secondary hyperparathyroidism of renal origin: Secondary | ICD-10-CM | POA: Diagnosis not present

## 2017-06-12 DIAGNOSIS — E8779 Other fluid overload: Secondary | ICD-10-CM | POA: Diagnosis not present

## 2017-06-13 DIAGNOSIS — N186 End stage renal disease: Secondary | ICD-10-CM | POA: Diagnosis not present

## 2017-06-13 DIAGNOSIS — D631 Anemia in chronic kidney disease: Secondary | ICD-10-CM | POA: Diagnosis not present

## 2017-06-13 DIAGNOSIS — E440 Moderate protein-calorie malnutrition: Secondary | ICD-10-CM | POA: Diagnosis not present

## 2017-06-13 DIAGNOSIS — R17 Unspecified jaundice: Secondary | ICD-10-CM | POA: Diagnosis not present

## 2017-06-13 DIAGNOSIS — Z79899 Other long term (current) drug therapy: Secondary | ICD-10-CM | POA: Diagnosis not present

## 2017-06-13 DIAGNOSIS — D509 Iron deficiency anemia, unspecified: Secondary | ICD-10-CM | POA: Diagnosis not present

## 2017-06-14 ENCOUNTER — Other Ambulatory Visit: Payer: Self-pay | Admitting: Internal Medicine

## 2017-06-14 DIAGNOSIS — N186 End stage renal disease: Secondary | ICD-10-CM | POA: Diagnosis not present

## 2017-06-14 DIAGNOSIS — D631 Anemia in chronic kidney disease: Secondary | ICD-10-CM | POA: Diagnosis not present

## 2017-06-14 DIAGNOSIS — D509 Iron deficiency anemia, unspecified: Secondary | ICD-10-CM | POA: Diagnosis not present

## 2017-06-14 DIAGNOSIS — E440 Moderate protein-calorie malnutrition: Secondary | ICD-10-CM | POA: Diagnosis not present

## 2017-06-14 DIAGNOSIS — Z79899 Other long term (current) drug therapy: Secondary | ICD-10-CM | POA: Diagnosis not present

## 2017-06-14 DIAGNOSIS — R17 Unspecified jaundice: Secondary | ICD-10-CM | POA: Diagnosis not present

## 2017-06-14 DIAGNOSIS — B20 Human immunodeficiency virus [HIV] disease: Secondary | ICD-10-CM

## 2017-06-16 ENCOUNTER — Ambulatory Visit: Payer: Medicare Other

## 2017-06-16 DIAGNOSIS — R17 Unspecified jaundice: Secondary | ICD-10-CM | POA: Diagnosis not present

## 2017-06-16 DIAGNOSIS — E440 Moderate protein-calorie malnutrition: Secondary | ICD-10-CM | POA: Diagnosis not present

## 2017-06-16 DIAGNOSIS — D631 Anemia in chronic kidney disease: Secondary | ICD-10-CM | POA: Diagnosis not present

## 2017-06-16 DIAGNOSIS — D509 Iron deficiency anemia, unspecified: Secondary | ICD-10-CM | POA: Diagnosis not present

## 2017-06-16 DIAGNOSIS — Z79899 Other long term (current) drug therapy: Secondary | ICD-10-CM | POA: Diagnosis not present

## 2017-06-16 DIAGNOSIS — N186 End stage renal disease: Secondary | ICD-10-CM | POA: Diagnosis not present

## 2017-06-17 DIAGNOSIS — Z79899 Other long term (current) drug therapy: Secondary | ICD-10-CM | POA: Diagnosis not present

## 2017-06-17 DIAGNOSIS — D631 Anemia in chronic kidney disease: Secondary | ICD-10-CM | POA: Diagnosis not present

## 2017-06-17 DIAGNOSIS — E440 Moderate protein-calorie malnutrition: Secondary | ICD-10-CM | POA: Diagnosis not present

## 2017-06-17 DIAGNOSIS — N186 End stage renal disease: Secondary | ICD-10-CM | POA: Diagnosis not present

## 2017-06-17 DIAGNOSIS — R17 Unspecified jaundice: Secondary | ICD-10-CM | POA: Diagnosis not present

## 2017-06-17 DIAGNOSIS — D509 Iron deficiency anemia, unspecified: Secondary | ICD-10-CM | POA: Diagnosis not present

## 2017-06-19 DIAGNOSIS — Z79899 Other long term (current) drug therapy: Secondary | ICD-10-CM | POA: Diagnosis not present

## 2017-06-19 DIAGNOSIS — D631 Anemia in chronic kidney disease: Secondary | ICD-10-CM | POA: Diagnosis not present

## 2017-06-19 DIAGNOSIS — E440 Moderate protein-calorie malnutrition: Secondary | ICD-10-CM | POA: Diagnosis not present

## 2017-06-19 DIAGNOSIS — D509 Iron deficiency anemia, unspecified: Secondary | ICD-10-CM | POA: Diagnosis not present

## 2017-06-19 DIAGNOSIS — R17 Unspecified jaundice: Secondary | ICD-10-CM | POA: Diagnosis not present

## 2017-06-19 DIAGNOSIS — N186 End stage renal disease: Secondary | ICD-10-CM | POA: Diagnosis not present

## 2017-06-20 DIAGNOSIS — R17 Unspecified jaundice: Secondary | ICD-10-CM | POA: Diagnosis not present

## 2017-06-20 DIAGNOSIS — D509 Iron deficiency anemia, unspecified: Secondary | ICD-10-CM | POA: Diagnosis not present

## 2017-06-20 DIAGNOSIS — E440 Moderate protein-calorie malnutrition: Secondary | ICD-10-CM | POA: Diagnosis not present

## 2017-06-20 DIAGNOSIS — Z79899 Other long term (current) drug therapy: Secondary | ICD-10-CM | POA: Diagnosis not present

## 2017-06-20 DIAGNOSIS — N186 End stage renal disease: Secondary | ICD-10-CM | POA: Diagnosis not present

## 2017-06-20 DIAGNOSIS — D631 Anemia in chronic kidney disease: Secondary | ICD-10-CM | POA: Diagnosis not present

## 2017-06-21 DIAGNOSIS — N186 End stage renal disease: Secondary | ICD-10-CM | POA: Diagnosis not present

## 2017-06-21 DIAGNOSIS — R17 Unspecified jaundice: Secondary | ICD-10-CM | POA: Diagnosis not present

## 2017-06-21 DIAGNOSIS — E440 Moderate protein-calorie malnutrition: Secondary | ICD-10-CM | POA: Diagnosis not present

## 2017-06-21 DIAGNOSIS — D509 Iron deficiency anemia, unspecified: Secondary | ICD-10-CM | POA: Diagnosis not present

## 2017-06-21 DIAGNOSIS — D631 Anemia in chronic kidney disease: Secondary | ICD-10-CM | POA: Diagnosis not present

## 2017-06-21 DIAGNOSIS — Z79899 Other long term (current) drug therapy: Secondary | ICD-10-CM | POA: Diagnosis not present

## 2017-06-26 DIAGNOSIS — N186 End stage renal disease: Secondary | ICD-10-CM | POA: Diagnosis not present

## 2017-06-26 DIAGNOSIS — E440 Moderate protein-calorie malnutrition: Secondary | ICD-10-CM | POA: Diagnosis not present

## 2017-06-26 DIAGNOSIS — Z79899 Other long term (current) drug therapy: Secondary | ICD-10-CM | POA: Diagnosis not present

## 2017-06-26 DIAGNOSIS — R17 Unspecified jaundice: Secondary | ICD-10-CM | POA: Diagnosis not present

## 2017-06-26 DIAGNOSIS — D509 Iron deficiency anemia, unspecified: Secondary | ICD-10-CM | POA: Diagnosis not present

## 2017-06-26 DIAGNOSIS — D631 Anemia in chronic kidney disease: Secondary | ICD-10-CM | POA: Diagnosis not present

## 2017-06-27 DIAGNOSIS — N186 End stage renal disease: Secondary | ICD-10-CM | POA: Diagnosis not present

## 2017-06-27 DIAGNOSIS — D631 Anemia in chronic kidney disease: Secondary | ICD-10-CM | POA: Diagnosis not present

## 2017-06-27 DIAGNOSIS — D509 Iron deficiency anemia, unspecified: Secondary | ICD-10-CM | POA: Diagnosis not present

## 2017-06-27 DIAGNOSIS — Z79899 Other long term (current) drug therapy: Secondary | ICD-10-CM | POA: Diagnosis not present

## 2017-06-27 DIAGNOSIS — E440 Moderate protein-calorie malnutrition: Secondary | ICD-10-CM | POA: Diagnosis not present

## 2017-06-27 DIAGNOSIS — R17 Unspecified jaundice: Secondary | ICD-10-CM | POA: Diagnosis not present

## 2017-06-30 DIAGNOSIS — D631 Anemia in chronic kidney disease: Secondary | ICD-10-CM | POA: Diagnosis not present

## 2017-06-30 DIAGNOSIS — R17 Unspecified jaundice: Secondary | ICD-10-CM | POA: Diagnosis not present

## 2017-06-30 DIAGNOSIS — D509 Iron deficiency anemia, unspecified: Secondary | ICD-10-CM | POA: Diagnosis not present

## 2017-06-30 DIAGNOSIS — E440 Moderate protein-calorie malnutrition: Secondary | ICD-10-CM | POA: Diagnosis not present

## 2017-06-30 DIAGNOSIS — N186 End stage renal disease: Secondary | ICD-10-CM | POA: Diagnosis not present

## 2017-06-30 DIAGNOSIS — Z79899 Other long term (current) drug therapy: Secondary | ICD-10-CM | POA: Diagnosis not present

## 2017-07-01 ENCOUNTER — Ambulatory Visit: Payer: Medicare Other | Admitting: Internal Medicine

## 2017-07-01 DIAGNOSIS — R17 Unspecified jaundice: Secondary | ICD-10-CM | POA: Diagnosis not present

## 2017-07-01 DIAGNOSIS — N186 End stage renal disease: Secondary | ICD-10-CM | POA: Diagnosis not present

## 2017-07-01 DIAGNOSIS — D631 Anemia in chronic kidney disease: Secondary | ICD-10-CM | POA: Diagnosis not present

## 2017-07-01 DIAGNOSIS — D509 Iron deficiency anemia, unspecified: Secondary | ICD-10-CM | POA: Diagnosis not present

## 2017-07-01 DIAGNOSIS — Z79899 Other long term (current) drug therapy: Secondary | ICD-10-CM | POA: Diagnosis not present

## 2017-07-01 DIAGNOSIS — E440 Moderate protein-calorie malnutrition: Secondary | ICD-10-CM | POA: Diagnosis not present

## 2017-07-01 NOTE — Progress Notes (Deleted)
PATIENT TRANSFERRED CARE TO [facility]

## 2017-07-02 DIAGNOSIS — D509 Iron deficiency anemia, unspecified: Secondary | ICD-10-CM | POA: Diagnosis not present

## 2017-07-02 DIAGNOSIS — E440 Moderate protein-calorie malnutrition: Secondary | ICD-10-CM | POA: Diagnosis not present

## 2017-07-02 DIAGNOSIS — D631 Anemia in chronic kidney disease: Secondary | ICD-10-CM | POA: Diagnosis not present

## 2017-07-02 DIAGNOSIS — R17 Unspecified jaundice: Secondary | ICD-10-CM | POA: Diagnosis not present

## 2017-07-02 DIAGNOSIS — N186 End stage renal disease: Secondary | ICD-10-CM | POA: Diagnosis not present

## 2017-07-02 DIAGNOSIS — Z79899 Other long term (current) drug therapy: Secondary | ICD-10-CM | POA: Diagnosis not present

## 2017-07-03 DIAGNOSIS — E440 Moderate protein-calorie malnutrition: Secondary | ICD-10-CM | POA: Diagnosis not present

## 2017-07-03 DIAGNOSIS — D509 Iron deficiency anemia, unspecified: Secondary | ICD-10-CM | POA: Diagnosis not present

## 2017-07-03 DIAGNOSIS — R17 Unspecified jaundice: Secondary | ICD-10-CM | POA: Diagnosis not present

## 2017-07-03 DIAGNOSIS — Z79899 Other long term (current) drug therapy: Secondary | ICD-10-CM | POA: Diagnosis not present

## 2017-07-03 DIAGNOSIS — D631 Anemia in chronic kidney disease: Secondary | ICD-10-CM | POA: Diagnosis not present

## 2017-07-03 DIAGNOSIS — N186 End stage renal disease: Secondary | ICD-10-CM | POA: Diagnosis not present

## 2017-07-04 DIAGNOSIS — D631 Anemia in chronic kidney disease: Secondary | ICD-10-CM | POA: Diagnosis not present

## 2017-07-04 DIAGNOSIS — R17 Unspecified jaundice: Secondary | ICD-10-CM | POA: Diagnosis not present

## 2017-07-04 DIAGNOSIS — Z79899 Other long term (current) drug therapy: Secondary | ICD-10-CM | POA: Diagnosis not present

## 2017-07-04 DIAGNOSIS — N186 End stage renal disease: Secondary | ICD-10-CM | POA: Diagnosis not present

## 2017-07-04 DIAGNOSIS — E440 Moderate protein-calorie malnutrition: Secondary | ICD-10-CM | POA: Diagnosis not present

## 2017-07-04 DIAGNOSIS — D509 Iron deficiency anemia, unspecified: Secondary | ICD-10-CM | POA: Diagnosis not present

## 2017-07-07 DIAGNOSIS — D509 Iron deficiency anemia, unspecified: Secondary | ICD-10-CM | POA: Diagnosis not present

## 2017-07-07 DIAGNOSIS — E440 Moderate protein-calorie malnutrition: Secondary | ICD-10-CM | POA: Diagnosis not present

## 2017-07-07 DIAGNOSIS — D631 Anemia in chronic kidney disease: Secondary | ICD-10-CM | POA: Diagnosis not present

## 2017-07-07 DIAGNOSIS — Z79899 Other long term (current) drug therapy: Secondary | ICD-10-CM | POA: Diagnosis not present

## 2017-07-07 DIAGNOSIS — N186 End stage renal disease: Secondary | ICD-10-CM | POA: Diagnosis not present

## 2017-07-07 DIAGNOSIS — R17 Unspecified jaundice: Secondary | ICD-10-CM | POA: Diagnosis not present

## 2017-07-08 DIAGNOSIS — N186 End stage renal disease: Secondary | ICD-10-CM | POA: Diagnosis not present

## 2017-07-08 DIAGNOSIS — E440 Moderate protein-calorie malnutrition: Secondary | ICD-10-CM | POA: Diagnosis not present

## 2017-07-08 DIAGNOSIS — Z79899 Other long term (current) drug therapy: Secondary | ICD-10-CM | POA: Diagnosis not present

## 2017-07-08 DIAGNOSIS — D631 Anemia in chronic kidney disease: Secondary | ICD-10-CM | POA: Diagnosis not present

## 2017-07-08 DIAGNOSIS — R17 Unspecified jaundice: Secondary | ICD-10-CM | POA: Diagnosis not present

## 2017-07-08 DIAGNOSIS — D509 Iron deficiency anemia, unspecified: Secondary | ICD-10-CM | POA: Diagnosis not present

## 2017-07-09 DIAGNOSIS — Z79899 Other long term (current) drug therapy: Secondary | ICD-10-CM | POA: Diagnosis not present

## 2017-07-09 DIAGNOSIS — R17 Unspecified jaundice: Secondary | ICD-10-CM | POA: Diagnosis not present

## 2017-07-09 DIAGNOSIS — N186 End stage renal disease: Secondary | ICD-10-CM | POA: Diagnosis not present

## 2017-07-09 DIAGNOSIS — D509 Iron deficiency anemia, unspecified: Secondary | ICD-10-CM | POA: Diagnosis not present

## 2017-07-09 DIAGNOSIS — D631 Anemia in chronic kidney disease: Secondary | ICD-10-CM | POA: Diagnosis not present

## 2017-07-09 DIAGNOSIS — E440 Moderate protein-calorie malnutrition: Secondary | ICD-10-CM | POA: Diagnosis not present

## 2017-07-10 DIAGNOSIS — E440 Moderate protein-calorie malnutrition: Secondary | ICD-10-CM | POA: Diagnosis not present

## 2017-07-10 DIAGNOSIS — D631 Anemia in chronic kidney disease: Secondary | ICD-10-CM | POA: Diagnosis not present

## 2017-07-10 DIAGNOSIS — D509 Iron deficiency anemia, unspecified: Secondary | ICD-10-CM | POA: Diagnosis not present

## 2017-07-10 DIAGNOSIS — N186 End stage renal disease: Secondary | ICD-10-CM | POA: Diagnosis not present

## 2017-07-10 DIAGNOSIS — Z79899 Other long term (current) drug therapy: Secondary | ICD-10-CM | POA: Diagnosis not present

## 2017-07-10 DIAGNOSIS — R17 Unspecified jaundice: Secondary | ICD-10-CM | POA: Diagnosis not present

## 2017-07-11 DIAGNOSIS — Z992 Dependence on renal dialysis: Secondary | ICD-10-CM | POA: Diagnosis not present

## 2017-07-11 DIAGNOSIS — B20 Human immunodeficiency virus [HIV] disease: Secondary | ICD-10-CM | POA: Diagnosis not present

## 2017-07-11 DIAGNOSIS — N186 End stage renal disease: Secondary | ICD-10-CM | POA: Diagnosis not present

## 2017-07-12 DIAGNOSIS — N186 End stage renal disease: Secondary | ICD-10-CM | POA: Diagnosis not present

## 2017-07-15 DIAGNOSIS — R17 Unspecified jaundice: Secondary | ICD-10-CM | POA: Diagnosis not present

## 2017-07-15 DIAGNOSIS — E8779 Other fluid overload: Secondary | ICD-10-CM | POA: Diagnosis not present

## 2017-07-15 DIAGNOSIS — N2581 Secondary hyperparathyroidism of renal origin: Secondary | ICD-10-CM | POA: Diagnosis not present

## 2017-07-15 DIAGNOSIS — Z79899 Other long term (current) drug therapy: Secondary | ICD-10-CM | POA: Diagnosis not present

## 2017-07-15 DIAGNOSIS — Z23 Encounter for immunization: Secondary | ICD-10-CM | POA: Diagnosis not present

## 2017-07-15 DIAGNOSIS — E440 Moderate protein-calorie malnutrition: Secondary | ICD-10-CM | POA: Diagnosis not present

## 2017-07-15 DIAGNOSIS — D509 Iron deficiency anemia, unspecified: Secondary | ICD-10-CM | POA: Diagnosis not present

## 2017-07-15 DIAGNOSIS — N2589 Other disorders resulting from impaired renal tubular function: Secondary | ICD-10-CM | POA: Diagnosis not present

## 2017-07-15 DIAGNOSIS — D631 Anemia in chronic kidney disease: Secondary | ICD-10-CM | POA: Diagnosis not present

## 2017-07-15 DIAGNOSIS — Z4932 Encounter for adequacy testing for peritoneal dialysis: Secondary | ICD-10-CM | POA: Diagnosis not present

## 2017-07-15 DIAGNOSIS — N186 End stage renal disease: Secondary | ICD-10-CM | POA: Diagnosis not present

## 2017-07-15 DIAGNOSIS — Z4931 Encounter for adequacy testing for hemodialysis: Secondary | ICD-10-CM | POA: Diagnosis not present

## 2017-07-16 ENCOUNTER — Other Ambulatory Visit: Payer: Self-pay | Admitting: Internal Medicine

## 2017-07-16 DIAGNOSIS — B20 Human immunodeficiency virus [HIV] disease: Secondary | ICD-10-CM

## 2017-07-17 DIAGNOSIS — D509 Iron deficiency anemia, unspecified: Secondary | ICD-10-CM | POA: Diagnosis not present

## 2017-07-17 DIAGNOSIS — Z4932 Encounter for adequacy testing for peritoneal dialysis: Secondary | ICD-10-CM | POA: Diagnosis not present

## 2017-07-17 DIAGNOSIS — N186 End stage renal disease: Secondary | ICD-10-CM | POA: Diagnosis not present

## 2017-07-17 DIAGNOSIS — R17 Unspecified jaundice: Secondary | ICD-10-CM | POA: Diagnosis not present

## 2017-07-17 DIAGNOSIS — Z79899 Other long term (current) drug therapy: Secondary | ICD-10-CM | POA: Diagnosis not present

## 2017-07-17 DIAGNOSIS — D631 Anemia in chronic kidney disease: Secondary | ICD-10-CM | POA: Diagnosis not present

## 2017-07-18 DIAGNOSIS — R17 Unspecified jaundice: Secondary | ICD-10-CM | POA: Diagnosis not present

## 2017-07-18 DIAGNOSIS — D631 Anemia in chronic kidney disease: Secondary | ICD-10-CM | POA: Diagnosis not present

## 2017-07-18 DIAGNOSIS — N186 End stage renal disease: Secondary | ICD-10-CM | POA: Diagnosis not present

## 2017-07-18 DIAGNOSIS — Z4932 Encounter for adequacy testing for peritoneal dialysis: Secondary | ICD-10-CM | POA: Diagnosis not present

## 2017-07-18 DIAGNOSIS — Z79899 Other long term (current) drug therapy: Secondary | ICD-10-CM | POA: Diagnosis not present

## 2017-07-18 DIAGNOSIS — D509 Iron deficiency anemia, unspecified: Secondary | ICD-10-CM | POA: Diagnosis not present

## 2017-07-21 DIAGNOSIS — R17 Unspecified jaundice: Secondary | ICD-10-CM | POA: Diagnosis not present

## 2017-07-21 DIAGNOSIS — Z79899 Other long term (current) drug therapy: Secondary | ICD-10-CM | POA: Diagnosis not present

## 2017-07-21 DIAGNOSIS — N186 End stage renal disease: Secondary | ICD-10-CM | POA: Diagnosis not present

## 2017-07-21 DIAGNOSIS — Z4932 Encounter for adequacy testing for peritoneal dialysis: Secondary | ICD-10-CM | POA: Diagnosis not present

## 2017-07-21 DIAGNOSIS — D509 Iron deficiency anemia, unspecified: Secondary | ICD-10-CM | POA: Diagnosis not present

## 2017-07-21 DIAGNOSIS — D631 Anemia in chronic kidney disease: Secondary | ICD-10-CM | POA: Diagnosis not present

## 2017-07-22 DIAGNOSIS — Z4932 Encounter for adequacy testing for peritoneal dialysis: Secondary | ICD-10-CM | POA: Diagnosis not present

## 2017-07-22 DIAGNOSIS — R17 Unspecified jaundice: Secondary | ICD-10-CM | POA: Diagnosis not present

## 2017-07-22 DIAGNOSIS — D509 Iron deficiency anemia, unspecified: Secondary | ICD-10-CM | POA: Diagnosis not present

## 2017-07-22 DIAGNOSIS — N186 End stage renal disease: Secondary | ICD-10-CM | POA: Diagnosis not present

## 2017-07-22 DIAGNOSIS — D631 Anemia in chronic kidney disease: Secondary | ICD-10-CM | POA: Diagnosis not present

## 2017-07-22 DIAGNOSIS — Z79899 Other long term (current) drug therapy: Secondary | ICD-10-CM | POA: Diagnosis not present

## 2017-07-26 DIAGNOSIS — N186 End stage renal disease: Secondary | ICD-10-CM | POA: Diagnosis not present

## 2017-07-28 DIAGNOSIS — N186 End stage renal disease: Secondary | ICD-10-CM | POA: Diagnosis not present

## 2017-07-28 DIAGNOSIS — Z79899 Other long term (current) drug therapy: Secondary | ICD-10-CM | POA: Diagnosis not present

## 2017-07-28 DIAGNOSIS — D631 Anemia in chronic kidney disease: Secondary | ICD-10-CM | POA: Diagnosis not present

## 2017-07-28 DIAGNOSIS — Z4932 Encounter for adequacy testing for peritoneal dialysis: Secondary | ICD-10-CM | POA: Diagnosis not present

## 2017-07-28 DIAGNOSIS — R17 Unspecified jaundice: Secondary | ICD-10-CM | POA: Diagnosis not present

## 2017-07-28 DIAGNOSIS — D509 Iron deficiency anemia, unspecified: Secondary | ICD-10-CM | POA: Diagnosis not present

## 2017-07-30 DIAGNOSIS — D631 Anemia in chronic kidney disease: Secondary | ICD-10-CM | POA: Diagnosis not present

## 2017-07-30 DIAGNOSIS — D509 Iron deficiency anemia, unspecified: Secondary | ICD-10-CM | POA: Diagnosis not present

## 2017-07-30 DIAGNOSIS — Z4932 Encounter for adequacy testing for peritoneal dialysis: Secondary | ICD-10-CM | POA: Diagnosis not present

## 2017-07-30 DIAGNOSIS — N186 End stage renal disease: Secondary | ICD-10-CM | POA: Diagnosis not present

## 2017-07-30 DIAGNOSIS — R17 Unspecified jaundice: Secondary | ICD-10-CM | POA: Diagnosis not present

## 2017-07-30 DIAGNOSIS — Z79899 Other long term (current) drug therapy: Secondary | ICD-10-CM | POA: Diagnosis not present

## 2017-07-31 DIAGNOSIS — D631 Anemia in chronic kidney disease: Secondary | ICD-10-CM | POA: Diagnosis not present

## 2017-07-31 DIAGNOSIS — D509 Iron deficiency anemia, unspecified: Secondary | ICD-10-CM | POA: Diagnosis not present

## 2017-07-31 DIAGNOSIS — N186 End stage renal disease: Secondary | ICD-10-CM | POA: Diagnosis not present

## 2017-07-31 DIAGNOSIS — Z79899 Other long term (current) drug therapy: Secondary | ICD-10-CM | POA: Diagnosis not present

## 2017-07-31 DIAGNOSIS — R17 Unspecified jaundice: Secondary | ICD-10-CM | POA: Diagnosis not present

## 2017-07-31 DIAGNOSIS — Z4932 Encounter for adequacy testing for peritoneal dialysis: Secondary | ICD-10-CM | POA: Diagnosis not present

## 2017-08-01 DIAGNOSIS — R17 Unspecified jaundice: Secondary | ICD-10-CM | POA: Diagnosis not present

## 2017-08-01 DIAGNOSIS — D509 Iron deficiency anemia, unspecified: Secondary | ICD-10-CM | POA: Diagnosis not present

## 2017-08-01 DIAGNOSIS — D631 Anemia in chronic kidney disease: Secondary | ICD-10-CM | POA: Diagnosis not present

## 2017-08-01 DIAGNOSIS — Z4932 Encounter for adequacy testing for peritoneal dialysis: Secondary | ICD-10-CM | POA: Diagnosis not present

## 2017-08-01 DIAGNOSIS — N186 End stage renal disease: Secondary | ICD-10-CM | POA: Diagnosis not present

## 2017-08-01 DIAGNOSIS — Z79899 Other long term (current) drug therapy: Secondary | ICD-10-CM | POA: Diagnosis not present

## 2017-08-04 DIAGNOSIS — D509 Iron deficiency anemia, unspecified: Secondary | ICD-10-CM | POA: Diagnosis not present

## 2017-08-04 DIAGNOSIS — N186 End stage renal disease: Secondary | ICD-10-CM | POA: Diagnosis not present

## 2017-08-04 DIAGNOSIS — Z4932 Encounter for adequacy testing for peritoneal dialysis: Secondary | ICD-10-CM | POA: Diagnosis not present

## 2017-08-04 DIAGNOSIS — D631 Anemia in chronic kidney disease: Secondary | ICD-10-CM | POA: Diagnosis not present

## 2017-08-04 DIAGNOSIS — Z79899 Other long term (current) drug therapy: Secondary | ICD-10-CM | POA: Diagnosis not present

## 2017-08-04 DIAGNOSIS — R17 Unspecified jaundice: Secondary | ICD-10-CM | POA: Diagnosis not present

## 2017-08-05 DIAGNOSIS — N186 End stage renal disease: Secondary | ICD-10-CM | POA: Diagnosis not present

## 2017-08-05 DIAGNOSIS — Z79899 Other long term (current) drug therapy: Secondary | ICD-10-CM | POA: Diagnosis not present

## 2017-08-05 DIAGNOSIS — D631 Anemia in chronic kidney disease: Secondary | ICD-10-CM | POA: Diagnosis not present

## 2017-08-05 DIAGNOSIS — Z4932 Encounter for adequacy testing for peritoneal dialysis: Secondary | ICD-10-CM | POA: Diagnosis not present

## 2017-08-05 DIAGNOSIS — R17 Unspecified jaundice: Secondary | ICD-10-CM | POA: Diagnosis not present

## 2017-08-05 DIAGNOSIS — D509 Iron deficiency anemia, unspecified: Secondary | ICD-10-CM | POA: Diagnosis not present

## 2017-08-06 DIAGNOSIS — Z4932 Encounter for adequacy testing for peritoneal dialysis: Secondary | ICD-10-CM | POA: Diagnosis not present

## 2017-08-06 DIAGNOSIS — N186 End stage renal disease: Secondary | ICD-10-CM | POA: Diagnosis not present

## 2017-08-06 DIAGNOSIS — R17 Unspecified jaundice: Secondary | ICD-10-CM | POA: Diagnosis not present

## 2017-08-06 DIAGNOSIS — D509 Iron deficiency anemia, unspecified: Secondary | ICD-10-CM | POA: Diagnosis not present

## 2017-08-06 DIAGNOSIS — D631 Anemia in chronic kidney disease: Secondary | ICD-10-CM | POA: Diagnosis not present

## 2017-08-06 DIAGNOSIS — Z79899 Other long term (current) drug therapy: Secondary | ICD-10-CM | POA: Diagnosis not present

## 2017-08-07 DIAGNOSIS — D509 Iron deficiency anemia, unspecified: Secondary | ICD-10-CM | POA: Diagnosis not present

## 2017-08-07 DIAGNOSIS — N186 End stage renal disease: Secondary | ICD-10-CM | POA: Diagnosis not present

## 2017-08-07 DIAGNOSIS — D631 Anemia in chronic kidney disease: Secondary | ICD-10-CM | POA: Diagnosis not present

## 2017-08-07 DIAGNOSIS — Z4932 Encounter for adequacy testing for peritoneal dialysis: Secondary | ICD-10-CM | POA: Diagnosis not present

## 2017-08-07 DIAGNOSIS — Z79899 Other long term (current) drug therapy: Secondary | ICD-10-CM | POA: Diagnosis not present

## 2017-08-07 DIAGNOSIS — R17 Unspecified jaundice: Secondary | ICD-10-CM | POA: Diagnosis not present

## 2017-08-07 IMAGING — DX DG FOOT COMPLETE 3+V*L*
3 series · 3 of 3 positions shown · non-contrast
Comparison: None.

CLINICAL DATA: Acute left foot pain after weight fell on it today.
Initial encounter.

EXAM:
LEFT FOOT - COMPLETE 3+ VIEW

[foot ap]
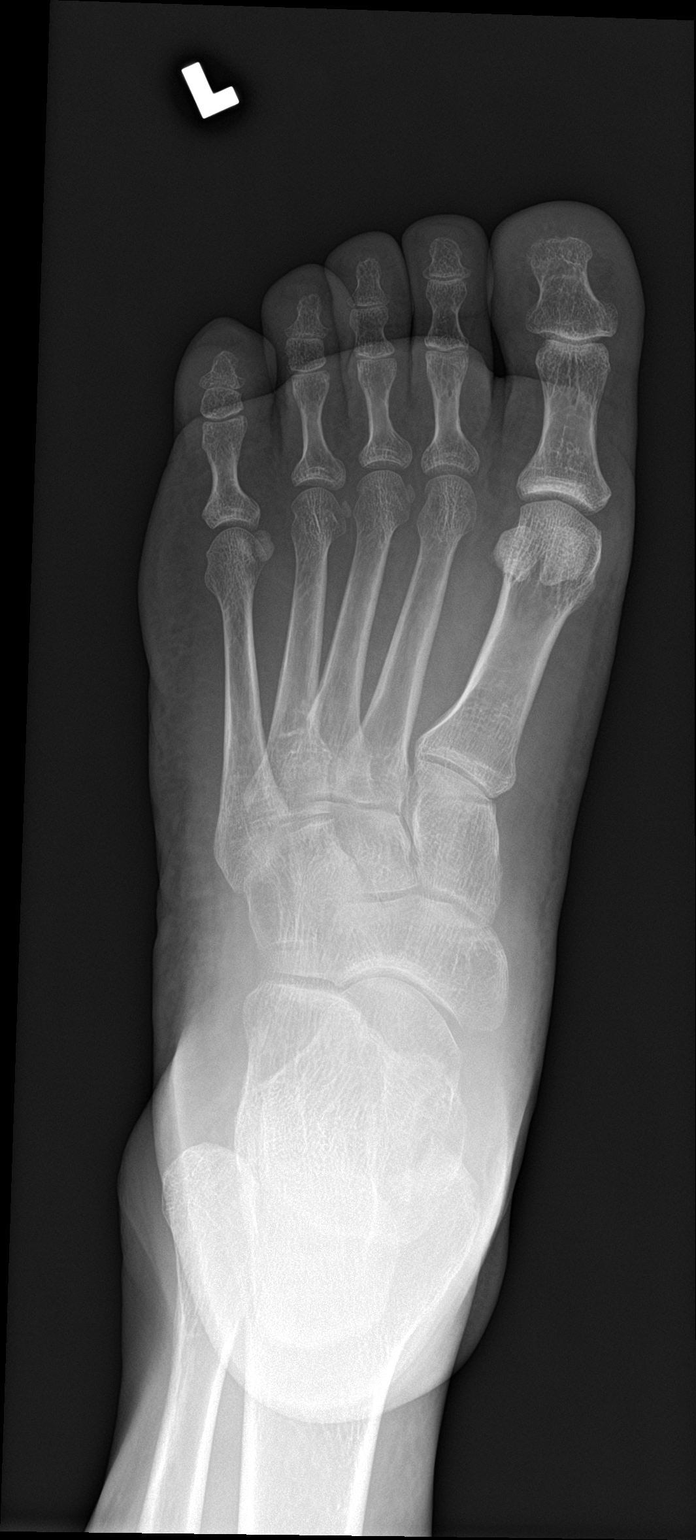

[foot obl]
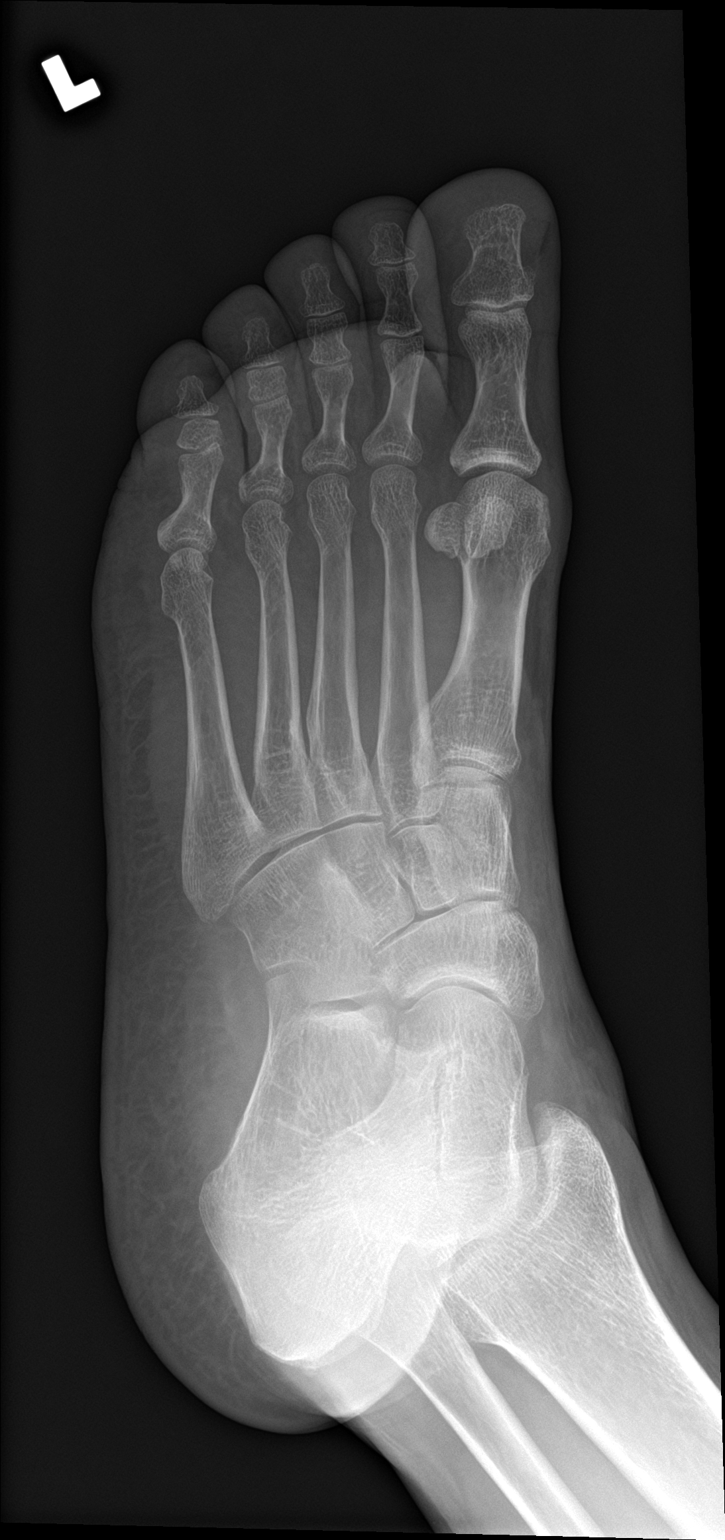

[foot lat]
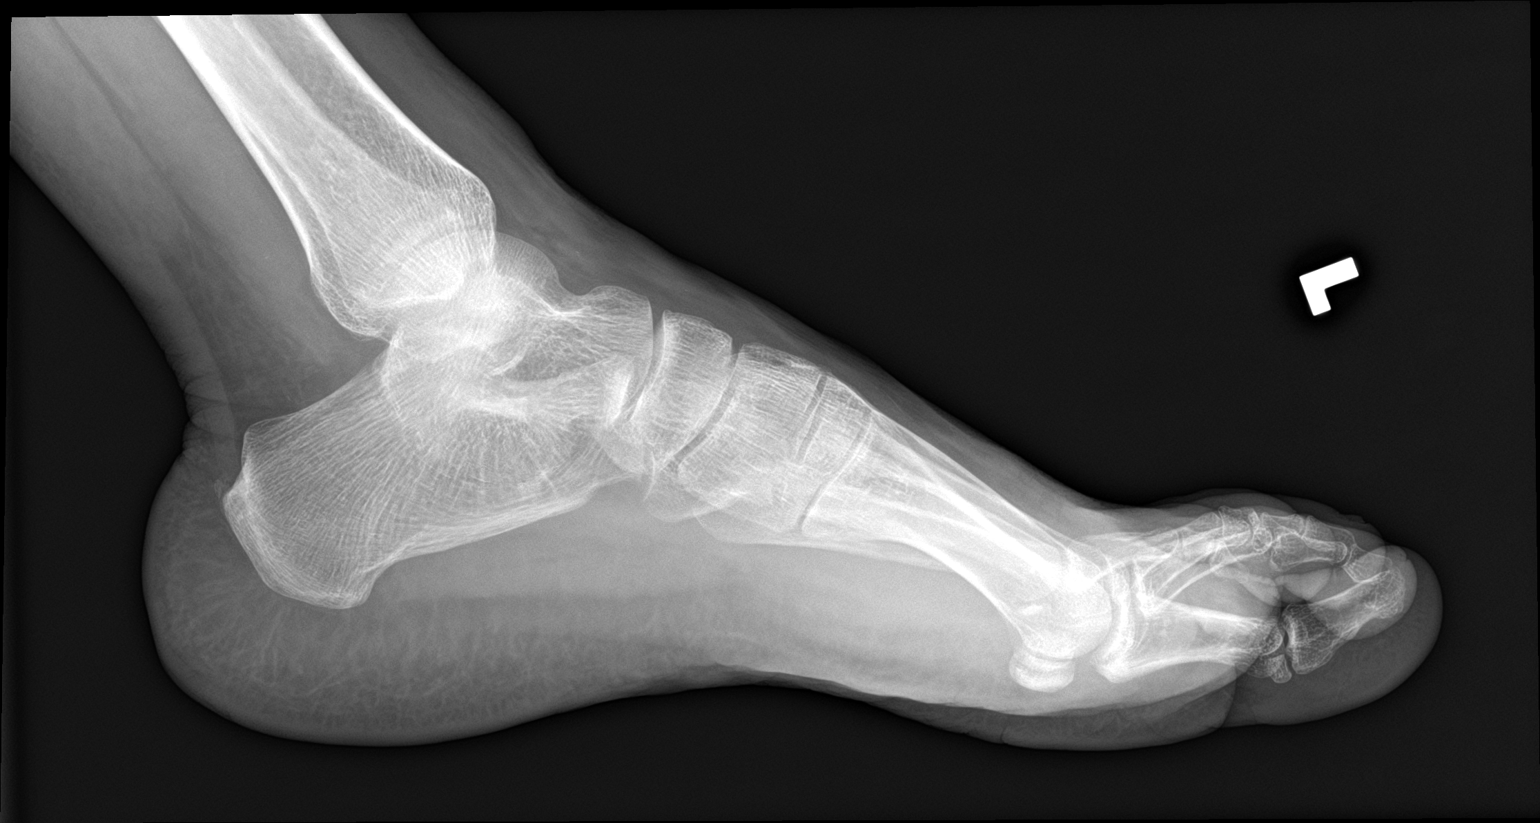

[3 of 3 positions shown; findings below may reference images not displayed]

FINDINGS: There is no evidence of fracture or dislocation. There is no
evidence of arthropathy or other focal bone abnormality. Soft
tissues are unremarkable.
IMPRESSION: Normal left foot.

## 2017-08-08 DIAGNOSIS — Z79899 Other long term (current) drug therapy: Secondary | ICD-10-CM | POA: Diagnosis not present

## 2017-08-08 DIAGNOSIS — D631 Anemia in chronic kidney disease: Secondary | ICD-10-CM | POA: Diagnosis not present

## 2017-08-08 DIAGNOSIS — N186 End stage renal disease: Secondary | ICD-10-CM | POA: Diagnosis not present

## 2017-08-08 DIAGNOSIS — Z4932 Encounter for adequacy testing for peritoneal dialysis: Secondary | ICD-10-CM | POA: Diagnosis not present

## 2017-08-08 DIAGNOSIS — R17 Unspecified jaundice: Secondary | ICD-10-CM | POA: Diagnosis not present

## 2017-08-08 DIAGNOSIS — D509 Iron deficiency anemia, unspecified: Secondary | ICD-10-CM | POA: Diagnosis not present

## 2017-08-10 DIAGNOSIS — Z992 Dependence on renal dialysis: Secondary | ICD-10-CM | POA: Diagnosis not present

## 2017-08-10 DIAGNOSIS — B20 Human immunodeficiency virus [HIV] disease: Secondary | ICD-10-CM | POA: Diagnosis not present

## 2017-08-10 DIAGNOSIS — N186 End stage renal disease: Secondary | ICD-10-CM | POA: Diagnosis not present

## 2017-08-11 DIAGNOSIS — E8779 Other fluid overload: Secondary | ICD-10-CM | POA: Diagnosis not present

## 2017-08-11 DIAGNOSIS — N186 End stage renal disease: Secondary | ICD-10-CM | POA: Diagnosis not present

## 2017-08-11 DIAGNOSIS — D631 Anemia in chronic kidney disease: Secondary | ICD-10-CM | POA: Diagnosis not present

## 2017-08-11 DIAGNOSIS — Z4932 Encounter for adequacy testing for peritoneal dialysis: Secondary | ICD-10-CM | POA: Diagnosis not present

## 2017-08-11 DIAGNOSIS — K769 Liver disease, unspecified: Secondary | ICD-10-CM | POA: Diagnosis not present

## 2017-08-11 DIAGNOSIS — N2589 Other disorders resulting from impaired renal tubular function: Secondary | ICD-10-CM | POA: Diagnosis not present

## 2017-08-11 DIAGNOSIS — E7849 Other hyperlipidemia: Secondary | ICD-10-CM | POA: Diagnosis not present

## 2017-08-11 DIAGNOSIS — Z4931 Encounter for adequacy testing for hemodialysis: Secondary | ICD-10-CM | POA: Diagnosis not present

## 2017-08-11 DIAGNOSIS — N2581 Secondary hyperparathyroidism of renal origin: Secondary | ICD-10-CM | POA: Diagnosis not present

## 2017-08-11 DIAGNOSIS — D509 Iron deficiency anemia, unspecified: Secondary | ICD-10-CM | POA: Diagnosis not present

## 2017-08-12 DIAGNOSIS — N186 End stage renal disease: Secondary | ICD-10-CM | POA: Diagnosis not present

## 2017-08-12 DIAGNOSIS — N2581 Secondary hyperparathyroidism of renal origin: Secondary | ICD-10-CM | POA: Diagnosis not present

## 2017-08-12 DIAGNOSIS — D631 Anemia in chronic kidney disease: Secondary | ICD-10-CM | POA: Diagnosis not present

## 2017-08-12 DIAGNOSIS — Z4932 Encounter for adequacy testing for peritoneal dialysis: Secondary | ICD-10-CM | POA: Diagnosis not present

## 2017-08-12 DIAGNOSIS — K769 Liver disease, unspecified: Secondary | ICD-10-CM | POA: Diagnosis not present

## 2017-08-12 DIAGNOSIS — D509 Iron deficiency anemia, unspecified: Secondary | ICD-10-CM | POA: Diagnosis not present

## 2017-08-14 DIAGNOSIS — N2581 Secondary hyperparathyroidism of renal origin: Secondary | ICD-10-CM | POA: Diagnosis not present

## 2017-08-14 DIAGNOSIS — Z4932 Encounter for adequacy testing for peritoneal dialysis: Secondary | ICD-10-CM | POA: Diagnosis not present

## 2017-08-14 DIAGNOSIS — K769 Liver disease, unspecified: Secondary | ICD-10-CM | POA: Diagnosis not present

## 2017-08-14 DIAGNOSIS — D509 Iron deficiency anemia, unspecified: Secondary | ICD-10-CM | POA: Diagnosis not present

## 2017-08-14 DIAGNOSIS — D631 Anemia in chronic kidney disease: Secondary | ICD-10-CM | POA: Diagnosis not present

## 2017-08-14 DIAGNOSIS — N186 End stage renal disease: Secondary | ICD-10-CM | POA: Diagnosis not present

## 2017-08-15 DIAGNOSIS — D631 Anemia in chronic kidney disease: Secondary | ICD-10-CM | POA: Diagnosis not present

## 2017-08-15 DIAGNOSIS — Z4932 Encounter for adequacy testing for peritoneal dialysis: Secondary | ICD-10-CM | POA: Diagnosis not present

## 2017-08-15 DIAGNOSIS — N186 End stage renal disease: Secondary | ICD-10-CM | POA: Diagnosis not present

## 2017-08-15 DIAGNOSIS — D509 Iron deficiency anemia, unspecified: Secondary | ICD-10-CM | POA: Diagnosis not present

## 2017-08-15 DIAGNOSIS — K769 Liver disease, unspecified: Secondary | ICD-10-CM | POA: Diagnosis not present

## 2017-08-15 DIAGNOSIS — N2581 Secondary hyperparathyroidism of renal origin: Secondary | ICD-10-CM | POA: Diagnosis not present

## 2017-08-18 DIAGNOSIS — N186 End stage renal disease: Secondary | ICD-10-CM | POA: Diagnosis not present

## 2017-08-18 DIAGNOSIS — D509 Iron deficiency anemia, unspecified: Secondary | ICD-10-CM | POA: Diagnosis not present

## 2017-08-18 DIAGNOSIS — N2581 Secondary hyperparathyroidism of renal origin: Secondary | ICD-10-CM | POA: Diagnosis not present

## 2017-08-18 DIAGNOSIS — Z4932 Encounter for adequacy testing for peritoneal dialysis: Secondary | ICD-10-CM | POA: Diagnosis not present

## 2017-08-18 DIAGNOSIS — D631 Anemia in chronic kidney disease: Secondary | ICD-10-CM | POA: Diagnosis not present

## 2017-08-18 DIAGNOSIS — K769 Liver disease, unspecified: Secondary | ICD-10-CM | POA: Diagnosis not present

## 2017-08-20 DIAGNOSIS — D631 Anemia in chronic kidney disease: Secondary | ICD-10-CM | POA: Diagnosis not present

## 2017-08-20 DIAGNOSIS — K769 Liver disease, unspecified: Secondary | ICD-10-CM | POA: Diagnosis not present

## 2017-08-20 DIAGNOSIS — N2581 Secondary hyperparathyroidism of renal origin: Secondary | ICD-10-CM | POA: Diagnosis not present

## 2017-08-20 DIAGNOSIS — N186 End stage renal disease: Secondary | ICD-10-CM | POA: Diagnosis not present

## 2017-08-20 DIAGNOSIS — D509 Iron deficiency anemia, unspecified: Secondary | ICD-10-CM | POA: Diagnosis not present

## 2017-08-20 DIAGNOSIS — Z4932 Encounter for adequacy testing for peritoneal dialysis: Secondary | ICD-10-CM | POA: Diagnosis not present

## 2017-08-21 DIAGNOSIS — N186 End stage renal disease: Secondary | ICD-10-CM | POA: Diagnosis not present

## 2017-08-21 DIAGNOSIS — Z4932 Encounter for adequacy testing for peritoneal dialysis: Secondary | ICD-10-CM | POA: Diagnosis not present

## 2017-08-21 DIAGNOSIS — K769 Liver disease, unspecified: Secondary | ICD-10-CM | POA: Diagnosis not present

## 2017-08-21 DIAGNOSIS — D509 Iron deficiency anemia, unspecified: Secondary | ICD-10-CM | POA: Diagnosis not present

## 2017-08-21 DIAGNOSIS — N2581 Secondary hyperparathyroidism of renal origin: Secondary | ICD-10-CM | POA: Diagnosis not present

## 2017-08-21 DIAGNOSIS — D631 Anemia in chronic kidney disease: Secondary | ICD-10-CM | POA: Diagnosis not present

## 2017-08-22 DIAGNOSIS — D631 Anemia in chronic kidney disease: Secondary | ICD-10-CM | POA: Diagnosis not present

## 2017-08-22 DIAGNOSIS — Z4932 Encounter for adequacy testing for peritoneal dialysis: Secondary | ICD-10-CM | POA: Diagnosis not present

## 2017-08-22 DIAGNOSIS — D509 Iron deficiency anemia, unspecified: Secondary | ICD-10-CM | POA: Diagnosis not present

## 2017-08-22 DIAGNOSIS — N186 End stage renal disease: Secondary | ICD-10-CM | POA: Diagnosis not present

## 2017-08-22 DIAGNOSIS — N2581 Secondary hyperparathyroidism of renal origin: Secondary | ICD-10-CM | POA: Diagnosis not present

## 2017-08-22 DIAGNOSIS — K769 Liver disease, unspecified: Secondary | ICD-10-CM | POA: Diagnosis not present

## 2017-08-25 DIAGNOSIS — K769 Liver disease, unspecified: Secondary | ICD-10-CM | POA: Diagnosis not present

## 2017-08-25 DIAGNOSIS — D509 Iron deficiency anemia, unspecified: Secondary | ICD-10-CM | POA: Diagnosis not present

## 2017-08-25 DIAGNOSIS — N186 End stage renal disease: Secondary | ICD-10-CM | POA: Diagnosis not present

## 2017-08-25 DIAGNOSIS — N2581 Secondary hyperparathyroidism of renal origin: Secondary | ICD-10-CM | POA: Diagnosis not present

## 2017-08-25 DIAGNOSIS — Z4932 Encounter for adequacy testing for peritoneal dialysis: Secondary | ICD-10-CM | POA: Diagnosis not present

## 2017-08-25 DIAGNOSIS — D631 Anemia in chronic kidney disease: Secondary | ICD-10-CM | POA: Diagnosis not present

## 2017-08-27 DIAGNOSIS — D631 Anemia in chronic kidney disease: Secondary | ICD-10-CM | POA: Diagnosis not present

## 2017-08-27 DIAGNOSIS — K769 Liver disease, unspecified: Secondary | ICD-10-CM | POA: Diagnosis not present

## 2017-08-27 DIAGNOSIS — N2581 Secondary hyperparathyroidism of renal origin: Secondary | ICD-10-CM | POA: Diagnosis not present

## 2017-08-27 DIAGNOSIS — D509 Iron deficiency anemia, unspecified: Secondary | ICD-10-CM | POA: Diagnosis not present

## 2017-08-27 DIAGNOSIS — Z4932 Encounter for adequacy testing for peritoneal dialysis: Secondary | ICD-10-CM | POA: Diagnosis not present

## 2017-08-27 DIAGNOSIS — N186 End stage renal disease: Secondary | ICD-10-CM | POA: Diagnosis not present

## 2017-08-28 DIAGNOSIS — D631 Anemia in chronic kidney disease: Secondary | ICD-10-CM | POA: Diagnosis not present

## 2017-08-28 DIAGNOSIS — D509 Iron deficiency anemia, unspecified: Secondary | ICD-10-CM | POA: Diagnosis not present

## 2017-08-28 DIAGNOSIS — Z4932 Encounter for adequacy testing for peritoneal dialysis: Secondary | ICD-10-CM | POA: Diagnosis not present

## 2017-08-28 DIAGNOSIS — N186 End stage renal disease: Secondary | ICD-10-CM | POA: Diagnosis not present

## 2017-08-28 DIAGNOSIS — N2581 Secondary hyperparathyroidism of renal origin: Secondary | ICD-10-CM | POA: Diagnosis not present

## 2017-08-28 DIAGNOSIS — K769 Liver disease, unspecified: Secondary | ICD-10-CM | POA: Diagnosis not present

## 2017-08-29 DIAGNOSIS — D631 Anemia in chronic kidney disease: Secondary | ICD-10-CM | POA: Diagnosis not present

## 2017-08-29 DIAGNOSIS — Z4932 Encounter for adequacy testing for peritoneal dialysis: Secondary | ICD-10-CM | POA: Diagnosis not present

## 2017-08-29 DIAGNOSIS — N186 End stage renal disease: Secondary | ICD-10-CM | POA: Diagnosis not present

## 2017-08-29 DIAGNOSIS — D509 Iron deficiency anemia, unspecified: Secondary | ICD-10-CM | POA: Diagnosis not present

## 2017-08-29 DIAGNOSIS — K769 Liver disease, unspecified: Secondary | ICD-10-CM | POA: Diagnosis not present

## 2017-08-29 DIAGNOSIS — N2581 Secondary hyperparathyroidism of renal origin: Secondary | ICD-10-CM | POA: Diagnosis not present

## 2017-09-01 DIAGNOSIS — N2581 Secondary hyperparathyroidism of renal origin: Secondary | ICD-10-CM | POA: Diagnosis not present

## 2017-09-01 DIAGNOSIS — Z4932 Encounter for adequacy testing for peritoneal dialysis: Secondary | ICD-10-CM | POA: Diagnosis not present

## 2017-09-01 DIAGNOSIS — N186 End stage renal disease: Secondary | ICD-10-CM | POA: Diagnosis not present

## 2017-09-01 DIAGNOSIS — D631 Anemia in chronic kidney disease: Secondary | ICD-10-CM | POA: Diagnosis not present

## 2017-09-01 DIAGNOSIS — D509 Iron deficiency anemia, unspecified: Secondary | ICD-10-CM | POA: Diagnosis not present

## 2017-09-01 DIAGNOSIS — K769 Liver disease, unspecified: Secondary | ICD-10-CM | POA: Diagnosis not present

## 2017-09-03 DIAGNOSIS — N2581 Secondary hyperparathyroidism of renal origin: Secondary | ICD-10-CM | POA: Diagnosis not present

## 2017-09-03 DIAGNOSIS — D631 Anemia in chronic kidney disease: Secondary | ICD-10-CM | POA: Diagnosis not present

## 2017-09-03 DIAGNOSIS — N186 End stage renal disease: Secondary | ICD-10-CM | POA: Diagnosis not present

## 2017-09-03 DIAGNOSIS — K769 Liver disease, unspecified: Secondary | ICD-10-CM | POA: Diagnosis not present

## 2017-09-03 DIAGNOSIS — D509 Iron deficiency anemia, unspecified: Secondary | ICD-10-CM | POA: Diagnosis not present

## 2017-09-03 DIAGNOSIS — Z4932 Encounter for adequacy testing for peritoneal dialysis: Secondary | ICD-10-CM | POA: Diagnosis not present

## 2017-09-04 DIAGNOSIS — D509 Iron deficiency anemia, unspecified: Secondary | ICD-10-CM | POA: Diagnosis not present

## 2017-09-04 DIAGNOSIS — K769 Liver disease, unspecified: Secondary | ICD-10-CM | POA: Diagnosis not present

## 2017-09-04 DIAGNOSIS — D631 Anemia in chronic kidney disease: Secondary | ICD-10-CM | POA: Diagnosis not present

## 2017-09-04 DIAGNOSIS — Z4932 Encounter for adequacy testing for peritoneal dialysis: Secondary | ICD-10-CM | POA: Diagnosis not present

## 2017-09-04 DIAGNOSIS — N2581 Secondary hyperparathyroidism of renal origin: Secondary | ICD-10-CM | POA: Diagnosis not present

## 2017-09-04 DIAGNOSIS — N186 End stage renal disease: Secondary | ICD-10-CM | POA: Diagnosis not present

## 2017-09-05 DIAGNOSIS — Z4932 Encounter for adequacy testing for peritoneal dialysis: Secondary | ICD-10-CM | POA: Diagnosis not present

## 2017-09-05 DIAGNOSIS — N186 End stage renal disease: Secondary | ICD-10-CM | POA: Diagnosis not present

## 2017-09-05 DIAGNOSIS — D631 Anemia in chronic kidney disease: Secondary | ICD-10-CM | POA: Diagnosis not present

## 2017-09-05 DIAGNOSIS — N2581 Secondary hyperparathyroidism of renal origin: Secondary | ICD-10-CM | POA: Diagnosis not present

## 2017-09-05 DIAGNOSIS — D509 Iron deficiency anemia, unspecified: Secondary | ICD-10-CM | POA: Diagnosis not present

## 2017-09-05 DIAGNOSIS — K769 Liver disease, unspecified: Secondary | ICD-10-CM | POA: Diagnosis not present

## 2017-09-08 DIAGNOSIS — D509 Iron deficiency anemia, unspecified: Secondary | ICD-10-CM | POA: Diagnosis not present

## 2017-09-08 DIAGNOSIS — Z4932 Encounter for adequacy testing for peritoneal dialysis: Secondary | ICD-10-CM | POA: Diagnosis not present

## 2017-09-08 DIAGNOSIS — N2581 Secondary hyperparathyroidism of renal origin: Secondary | ICD-10-CM | POA: Diagnosis not present

## 2017-09-08 DIAGNOSIS — D631 Anemia in chronic kidney disease: Secondary | ICD-10-CM | POA: Diagnosis not present

## 2017-09-08 DIAGNOSIS — K769 Liver disease, unspecified: Secondary | ICD-10-CM | POA: Diagnosis not present

## 2017-09-08 DIAGNOSIS — N186 End stage renal disease: Secondary | ICD-10-CM | POA: Diagnosis not present

## 2017-09-10 DIAGNOSIS — D631 Anemia in chronic kidney disease: Secondary | ICD-10-CM | POA: Diagnosis not present

## 2017-09-10 DIAGNOSIS — K769 Liver disease, unspecified: Secondary | ICD-10-CM | POA: Diagnosis not present

## 2017-09-10 DIAGNOSIS — N186 End stage renal disease: Secondary | ICD-10-CM | POA: Diagnosis not present

## 2017-09-10 DIAGNOSIS — N2581 Secondary hyperparathyroidism of renal origin: Secondary | ICD-10-CM | POA: Diagnosis not present

## 2017-09-10 DIAGNOSIS — Z992 Dependence on renal dialysis: Secondary | ICD-10-CM | POA: Diagnosis not present

## 2017-09-10 DIAGNOSIS — Z4932 Encounter for adequacy testing for peritoneal dialysis: Secondary | ICD-10-CM | POA: Diagnosis not present

## 2017-09-10 DIAGNOSIS — D509 Iron deficiency anemia, unspecified: Secondary | ICD-10-CM | POA: Diagnosis not present

## 2017-09-10 DIAGNOSIS — B20 Human immunodeficiency virus [HIV] disease: Secondary | ICD-10-CM | POA: Diagnosis not present

## 2017-09-11 DIAGNOSIS — E8779 Other fluid overload: Secondary | ICD-10-CM | POA: Diagnosis not present

## 2017-09-11 DIAGNOSIS — D631 Anemia in chronic kidney disease: Secondary | ICD-10-CM | POA: Diagnosis not present

## 2017-09-11 DIAGNOSIS — Z4931 Encounter for adequacy testing for hemodialysis: Secondary | ICD-10-CM | POA: Diagnosis not present

## 2017-09-11 DIAGNOSIS — Z79899 Other long term (current) drug therapy: Secondary | ICD-10-CM | POA: Diagnosis not present

## 2017-09-11 DIAGNOSIS — E440 Moderate protein-calorie malnutrition: Secondary | ICD-10-CM | POA: Diagnosis not present

## 2017-09-11 DIAGNOSIS — N186 End stage renal disease: Secondary | ICD-10-CM | POA: Diagnosis not present

## 2017-09-11 DIAGNOSIS — N2581 Secondary hyperparathyroidism of renal origin: Secondary | ICD-10-CM | POA: Diagnosis not present

## 2017-09-11 DIAGNOSIS — R17 Unspecified jaundice: Secondary | ICD-10-CM | POA: Diagnosis not present

## 2017-09-11 DIAGNOSIS — D509 Iron deficiency anemia, unspecified: Secondary | ICD-10-CM | POA: Diagnosis not present

## 2017-09-12 DIAGNOSIS — R17 Unspecified jaundice: Secondary | ICD-10-CM | POA: Diagnosis not present

## 2017-09-12 DIAGNOSIS — N186 End stage renal disease: Secondary | ICD-10-CM | POA: Diagnosis not present

## 2017-09-12 DIAGNOSIS — D509 Iron deficiency anemia, unspecified: Secondary | ICD-10-CM | POA: Diagnosis not present

## 2017-09-12 DIAGNOSIS — Z4931 Encounter for adequacy testing for hemodialysis: Secondary | ICD-10-CM | POA: Diagnosis not present

## 2017-09-12 DIAGNOSIS — N2581 Secondary hyperparathyroidism of renal origin: Secondary | ICD-10-CM | POA: Diagnosis not present

## 2017-09-12 DIAGNOSIS — Z79899 Other long term (current) drug therapy: Secondary | ICD-10-CM | POA: Diagnosis not present

## 2017-09-14 ENCOUNTER — Other Ambulatory Visit: Payer: Self-pay | Admitting: Neurology

## 2017-09-15 ENCOUNTER — Other Ambulatory Visit: Payer: Self-pay | Admitting: Neurology

## 2017-09-15 DIAGNOSIS — N2581 Secondary hyperparathyroidism of renal origin: Secondary | ICD-10-CM | POA: Diagnosis not present

## 2017-09-15 DIAGNOSIS — R17 Unspecified jaundice: Secondary | ICD-10-CM | POA: Diagnosis not present

## 2017-09-15 DIAGNOSIS — N186 End stage renal disease: Secondary | ICD-10-CM | POA: Diagnosis not present

## 2017-09-15 DIAGNOSIS — Z79899 Other long term (current) drug therapy: Secondary | ICD-10-CM | POA: Diagnosis not present

## 2017-09-15 DIAGNOSIS — Z4931 Encounter for adequacy testing for hemodialysis: Secondary | ICD-10-CM | POA: Diagnosis not present

## 2017-09-15 DIAGNOSIS — D509 Iron deficiency anemia, unspecified: Secondary | ICD-10-CM | POA: Diagnosis not present

## 2017-09-15 NOTE — Addendum Note (Signed)
Addended by: Rossie Muskrat L on: 09/15/2017 01:04 PM   Modules accepted: Orders

## 2017-09-15 NOTE — Telephone Encounter (Signed)
Faxed printed/signed rx Tegretol to [facility] at [phone]. Received fax confirmation.

## 2017-09-17 DIAGNOSIS — Z79899 Other long term (current) drug therapy: Secondary | ICD-10-CM | POA: Diagnosis not present

## 2017-09-17 DIAGNOSIS — D509 Iron deficiency anemia, unspecified: Secondary | ICD-10-CM | POA: Diagnosis not present

## 2017-09-17 DIAGNOSIS — N2581 Secondary hyperparathyroidism of renal origin: Secondary | ICD-10-CM | POA: Diagnosis not present

## 2017-09-17 DIAGNOSIS — Z4931 Encounter for adequacy testing for hemodialysis: Secondary | ICD-10-CM | POA: Diagnosis not present

## 2017-09-17 DIAGNOSIS — R17 Unspecified jaundice: Secondary | ICD-10-CM | POA: Diagnosis not present

## 2017-09-17 DIAGNOSIS — N186 End stage renal disease: Secondary | ICD-10-CM | POA: Diagnosis not present

## 2017-09-18 DIAGNOSIS — N2581 Secondary hyperparathyroidism of renal origin: Secondary | ICD-10-CM | POA: Diagnosis not present

## 2017-09-18 DIAGNOSIS — Z4931 Encounter for adequacy testing for hemodialysis: Secondary | ICD-10-CM | POA: Diagnosis not present

## 2017-09-18 DIAGNOSIS — R17 Unspecified jaundice: Secondary | ICD-10-CM | POA: Diagnosis not present

## 2017-09-18 DIAGNOSIS — D509 Iron deficiency anemia, unspecified: Secondary | ICD-10-CM | POA: Diagnosis not present

## 2017-09-18 DIAGNOSIS — Z79899 Other long term (current) drug therapy: Secondary | ICD-10-CM | POA: Diagnosis not present

## 2017-09-18 DIAGNOSIS — N186 End stage renal disease: Secondary | ICD-10-CM | POA: Diagnosis not present

## 2017-09-19 DIAGNOSIS — Z4931 Encounter for adequacy testing for hemodialysis: Secondary | ICD-10-CM | POA: Diagnosis not present

## 2017-09-19 DIAGNOSIS — R17 Unspecified jaundice: Secondary | ICD-10-CM | POA: Diagnosis not present

## 2017-09-19 DIAGNOSIS — N186 End stage renal disease: Secondary | ICD-10-CM | POA: Diagnosis not present

## 2017-09-19 DIAGNOSIS — D509 Iron deficiency anemia, unspecified: Secondary | ICD-10-CM | POA: Diagnosis not present

## 2017-09-19 DIAGNOSIS — Z79899 Other long term (current) drug therapy: Secondary | ICD-10-CM | POA: Diagnosis not present

## 2017-09-19 DIAGNOSIS — N2581 Secondary hyperparathyroidism of renal origin: Secondary | ICD-10-CM | POA: Diagnosis not present

## 2017-09-22 DIAGNOSIS — Z4931 Encounter for adequacy testing for hemodialysis: Secondary | ICD-10-CM | POA: Diagnosis not present

## 2017-09-22 DIAGNOSIS — D509 Iron deficiency anemia, unspecified: Secondary | ICD-10-CM | POA: Diagnosis not present

## 2017-09-22 DIAGNOSIS — N2581 Secondary hyperparathyroidism of renal origin: Secondary | ICD-10-CM | POA: Diagnosis not present

## 2017-09-22 DIAGNOSIS — Z79899 Other long term (current) drug therapy: Secondary | ICD-10-CM | POA: Diagnosis not present

## 2017-09-22 DIAGNOSIS — N186 End stage renal disease: Secondary | ICD-10-CM | POA: Diagnosis not present

## 2017-09-22 DIAGNOSIS — R17 Unspecified jaundice: Secondary | ICD-10-CM | POA: Diagnosis not present

## 2017-09-24 DIAGNOSIS — Z4931 Encounter for adequacy testing for hemodialysis: Secondary | ICD-10-CM | POA: Diagnosis not present

## 2017-09-24 DIAGNOSIS — N186 End stage renal disease: Secondary | ICD-10-CM | POA: Diagnosis not present

## 2017-09-24 DIAGNOSIS — R17 Unspecified jaundice: Secondary | ICD-10-CM | POA: Diagnosis not present

## 2017-09-24 DIAGNOSIS — D509 Iron deficiency anemia, unspecified: Secondary | ICD-10-CM | POA: Diagnosis not present

## 2017-09-24 DIAGNOSIS — Z79899 Other long term (current) drug therapy: Secondary | ICD-10-CM | POA: Diagnosis not present

## 2017-09-24 DIAGNOSIS — N2581 Secondary hyperparathyroidism of renal origin: Secondary | ICD-10-CM | POA: Diagnosis not present

## 2017-09-25 DIAGNOSIS — D509 Iron deficiency anemia, unspecified: Secondary | ICD-10-CM | POA: Diagnosis not present

## 2017-09-25 DIAGNOSIS — Z79899 Other long term (current) drug therapy: Secondary | ICD-10-CM | POA: Diagnosis not present

## 2017-09-25 DIAGNOSIS — N186 End stage renal disease: Secondary | ICD-10-CM | POA: Diagnosis not present

## 2017-09-25 DIAGNOSIS — N2581 Secondary hyperparathyroidism of renal origin: Secondary | ICD-10-CM | POA: Diagnosis not present

## 2017-09-25 DIAGNOSIS — Z4931 Encounter for adequacy testing for hemodialysis: Secondary | ICD-10-CM | POA: Diagnosis not present

## 2017-09-25 DIAGNOSIS — R17 Unspecified jaundice: Secondary | ICD-10-CM | POA: Diagnosis not present

## 2017-09-26 DIAGNOSIS — N186 End stage renal disease: Secondary | ICD-10-CM | POA: Diagnosis not present

## 2017-09-26 DIAGNOSIS — Z79899 Other long term (current) drug therapy: Secondary | ICD-10-CM | POA: Diagnosis not present

## 2017-09-26 DIAGNOSIS — Z4931 Encounter for adequacy testing for hemodialysis: Secondary | ICD-10-CM | POA: Diagnosis not present

## 2017-09-26 DIAGNOSIS — N2581 Secondary hyperparathyroidism of renal origin: Secondary | ICD-10-CM | POA: Diagnosis not present

## 2017-09-26 DIAGNOSIS — D509 Iron deficiency anemia, unspecified: Secondary | ICD-10-CM | POA: Diagnosis not present

## 2017-09-26 DIAGNOSIS — R17 Unspecified jaundice: Secondary | ICD-10-CM | POA: Diagnosis not present

## 2017-09-29 DIAGNOSIS — R17 Unspecified jaundice: Secondary | ICD-10-CM | POA: Diagnosis not present

## 2017-09-29 DIAGNOSIS — N2581 Secondary hyperparathyroidism of renal origin: Secondary | ICD-10-CM | POA: Diagnosis not present

## 2017-09-29 DIAGNOSIS — N186 End stage renal disease: Secondary | ICD-10-CM | POA: Diagnosis not present

## 2017-09-29 DIAGNOSIS — Z79899 Other long term (current) drug therapy: Secondary | ICD-10-CM | POA: Diagnosis not present

## 2017-09-29 DIAGNOSIS — D509 Iron deficiency anemia, unspecified: Secondary | ICD-10-CM | POA: Diagnosis not present

## 2017-09-29 DIAGNOSIS — Z4931 Encounter for adequacy testing for hemodialysis: Secondary | ICD-10-CM | POA: Diagnosis not present

## 2017-10-01 DIAGNOSIS — Z4931 Encounter for adequacy testing for hemodialysis: Secondary | ICD-10-CM | POA: Diagnosis not present

## 2017-10-01 DIAGNOSIS — Z79899 Other long term (current) drug therapy: Secondary | ICD-10-CM | POA: Diagnosis not present

## 2017-10-01 DIAGNOSIS — N186 End stage renal disease: Secondary | ICD-10-CM | POA: Diagnosis not present

## 2017-10-01 DIAGNOSIS — D509 Iron deficiency anemia, unspecified: Secondary | ICD-10-CM | POA: Diagnosis not present

## 2017-10-01 DIAGNOSIS — N2581 Secondary hyperparathyroidism of renal origin: Secondary | ICD-10-CM | POA: Diagnosis not present

## 2017-10-01 DIAGNOSIS — R17 Unspecified jaundice: Secondary | ICD-10-CM | POA: Diagnosis not present

## 2017-10-02 DIAGNOSIS — N2581 Secondary hyperparathyroidism of renal origin: Secondary | ICD-10-CM | POA: Diagnosis not present

## 2017-10-02 DIAGNOSIS — Z79899 Other long term (current) drug therapy: Secondary | ICD-10-CM | POA: Diagnosis not present

## 2017-10-02 DIAGNOSIS — N186 End stage renal disease: Secondary | ICD-10-CM | POA: Diagnosis not present

## 2017-10-02 DIAGNOSIS — Z4931 Encounter for adequacy testing for hemodialysis: Secondary | ICD-10-CM | POA: Diagnosis not present

## 2017-10-02 DIAGNOSIS — R17 Unspecified jaundice: Secondary | ICD-10-CM | POA: Diagnosis not present

## 2017-10-02 DIAGNOSIS — D509 Iron deficiency anemia, unspecified: Secondary | ICD-10-CM | POA: Diagnosis not present

## 2017-10-03 DIAGNOSIS — R17 Unspecified jaundice: Secondary | ICD-10-CM | POA: Diagnosis not present

## 2017-10-03 DIAGNOSIS — D509 Iron deficiency anemia, unspecified: Secondary | ICD-10-CM | POA: Diagnosis not present

## 2017-10-03 DIAGNOSIS — Z79899 Other long term (current) drug therapy: Secondary | ICD-10-CM | POA: Diagnosis not present

## 2017-10-03 DIAGNOSIS — Z4931 Encounter for adequacy testing for hemodialysis: Secondary | ICD-10-CM | POA: Diagnosis not present

## 2017-10-03 DIAGNOSIS — N186 End stage renal disease: Secondary | ICD-10-CM | POA: Diagnosis not present

## 2017-10-03 DIAGNOSIS — N2581 Secondary hyperparathyroidism of renal origin: Secondary | ICD-10-CM | POA: Diagnosis not present

## 2017-10-06 DIAGNOSIS — N186 End stage renal disease: Secondary | ICD-10-CM | POA: Diagnosis not present

## 2017-10-06 DIAGNOSIS — D509 Iron deficiency anemia, unspecified: Secondary | ICD-10-CM | POA: Diagnosis not present

## 2017-10-06 DIAGNOSIS — R17 Unspecified jaundice: Secondary | ICD-10-CM | POA: Diagnosis not present

## 2017-10-06 DIAGNOSIS — Z4931 Encounter for adequacy testing for hemodialysis: Secondary | ICD-10-CM | POA: Diagnosis not present

## 2017-10-06 DIAGNOSIS — N2581 Secondary hyperparathyroidism of renal origin: Secondary | ICD-10-CM | POA: Diagnosis not present

## 2017-10-06 DIAGNOSIS — Z79899 Other long term (current) drug therapy: Secondary | ICD-10-CM | POA: Diagnosis not present

## 2017-10-08 DIAGNOSIS — Z4931 Encounter for adequacy testing for hemodialysis: Secondary | ICD-10-CM | POA: Diagnosis not present

## 2017-10-08 DIAGNOSIS — N2581 Secondary hyperparathyroidism of renal origin: Secondary | ICD-10-CM | POA: Diagnosis not present

## 2017-10-08 DIAGNOSIS — Z79899 Other long term (current) drug therapy: Secondary | ICD-10-CM | POA: Diagnosis not present

## 2017-10-08 DIAGNOSIS — N186 End stage renal disease: Secondary | ICD-10-CM | POA: Diagnosis not present

## 2017-10-08 DIAGNOSIS — D509 Iron deficiency anemia, unspecified: Secondary | ICD-10-CM | POA: Diagnosis not present

## 2017-10-08 DIAGNOSIS — R17 Unspecified jaundice: Secondary | ICD-10-CM | POA: Diagnosis not present

## 2017-10-09 DIAGNOSIS — N2581 Secondary hyperparathyroidism of renal origin: Secondary | ICD-10-CM | POA: Diagnosis not present

## 2017-10-09 DIAGNOSIS — N186 End stage renal disease: Secondary | ICD-10-CM | POA: Diagnosis not present

## 2017-10-09 DIAGNOSIS — R17 Unspecified jaundice: Secondary | ICD-10-CM | POA: Diagnosis not present

## 2017-10-09 DIAGNOSIS — D509 Iron deficiency anemia, unspecified: Secondary | ICD-10-CM | POA: Diagnosis not present

## 2017-10-09 DIAGNOSIS — Z79899 Other long term (current) drug therapy: Secondary | ICD-10-CM | POA: Diagnosis not present

## 2017-10-09 DIAGNOSIS — Z4931 Encounter for adequacy testing for hemodialysis: Secondary | ICD-10-CM | POA: Diagnosis not present

## 2017-10-10 DIAGNOSIS — D509 Iron deficiency anemia, unspecified: Secondary | ICD-10-CM | POA: Diagnosis not present

## 2017-10-10 DIAGNOSIS — Z79899 Other long term (current) drug therapy: Secondary | ICD-10-CM | POA: Diagnosis not present

## 2017-10-10 DIAGNOSIS — Z992 Dependence on renal dialysis: Secondary | ICD-10-CM | POA: Diagnosis not present

## 2017-10-10 DIAGNOSIS — R17 Unspecified jaundice: Secondary | ICD-10-CM | POA: Diagnosis not present

## 2017-10-10 DIAGNOSIS — B20 Human immunodeficiency virus [HIV] disease: Secondary | ICD-10-CM | POA: Diagnosis not present

## 2017-10-10 DIAGNOSIS — N2581 Secondary hyperparathyroidism of renal origin: Secondary | ICD-10-CM | POA: Diagnosis not present

## 2017-10-10 DIAGNOSIS — Z4931 Encounter for adequacy testing for hemodialysis: Secondary | ICD-10-CM | POA: Diagnosis not present

## 2017-10-10 DIAGNOSIS — N186 End stage renal disease: Secondary | ICD-10-CM | POA: Diagnosis not present

## 2017-10-13 DIAGNOSIS — E8779 Other fluid overload: Secondary | ICD-10-CM | POA: Diagnosis not present

## 2017-10-13 DIAGNOSIS — N186 End stage renal disease: Secondary | ICD-10-CM | POA: Diagnosis not present

## 2017-10-13 DIAGNOSIS — R17 Unspecified jaundice: Secondary | ICD-10-CM | POA: Diagnosis not present

## 2017-10-13 DIAGNOSIS — Z4931 Encounter for adequacy testing for hemodialysis: Secondary | ICD-10-CM | POA: Diagnosis not present

## 2017-10-13 DIAGNOSIS — E440 Moderate protein-calorie malnutrition: Secondary | ICD-10-CM | POA: Diagnosis not present

## 2017-10-13 DIAGNOSIS — N2581 Secondary hyperparathyroidism of renal origin: Secondary | ICD-10-CM | POA: Diagnosis not present

## 2017-10-13 DIAGNOSIS — Z79899 Other long term (current) drug therapy: Secondary | ICD-10-CM | POA: Diagnosis not present

## 2017-10-13 DIAGNOSIS — K769 Liver disease, unspecified: Secondary | ICD-10-CM | POA: Diagnosis not present

## 2017-10-13 DIAGNOSIS — D509 Iron deficiency anemia, unspecified: Secondary | ICD-10-CM | POA: Diagnosis not present

## 2017-10-13 DIAGNOSIS — D631 Anemia in chronic kidney disease: Secondary | ICD-10-CM | POA: Diagnosis not present

## 2017-10-15 DIAGNOSIS — D509 Iron deficiency anemia, unspecified: Secondary | ICD-10-CM | POA: Diagnosis not present

## 2017-10-15 DIAGNOSIS — E440 Moderate protein-calorie malnutrition: Secondary | ICD-10-CM | POA: Diagnosis not present

## 2017-10-15 DIAGNOSIS — D631 Anemia in chronic kidney disease: Secondary | ICD-10-CM | POA: Diagnosis not present

## 2017-10-15 DIAGNOSIS — K769 Liver disease, unspecified: Secondary | ICD-10-CM | POA: Diagnosis not present

## 2017-10-15 DIAGNOSIS — N2581 Secondary hyperparathyroidism of renal origin: Secondary | ICD-10-CM | POA: Diagnosis not present

## 2017-10-15 DIAGNOSIS — N186 End stage renal disease: Secondary | ICD-10-CM | POA: Diagnosis not present

## 2017-10-16 DIAGNOSIS — D509 Iron deficiency anemia, unspecified: Secondary | ICD-10-CM | POA: Diagnosis not present

## 2017-10-16 DIAGNOSIS — E440 Moderate protein-calorie malnutrition: Secondary | ICD-10-CM | POA: Diagnosis not present

## 2017-10-16 DIAGNOSIS — N186 End stage renal disease: Secondary | ICD-10-CM | POA: Diagnosis not present

## 2017-10-16 DIAGNOSIS — K769 Liver disease, unspecified: Secondary | ICD-10-CM | POA: Diagnosis not present

## 2017-10-16 DIAGNOSIS — N2581 Secondary hyperparathyroidism of renal origin: Secondary | ICD-10-CM | POA: Diagnosis not present

## 2017-10-16 DIAGNOSIS — D631 Anemia in chronic kidney disease: Secondary | ICD-10-CM | POA: Diagnosis not present

## 2017-10-17 DIAGNOSIS — K769 Liver disease, unspecified: Secondary | ICD-10-CM | POA: Diagnosis not present

## 2017-10-17 DIAGNOSIS — D631 Anemia in chronic kidney disease: Secondary | ICD-10-CM | POA: Diagnosis not present

## 2017-10-17 DIAGNOSIS — N186 End stage renal disease: Secondary | ICD-10-CM | POA: Diagnosis not present

## 2017-10-17 DIAGNOSIS — N2581 Secondary hyperparathyroidism of renal origin: Secondary | ICD-10-CM | POA: Diagnosis not present

## 2017-10-17 DIAGNOSIS — E440 Moderate protein-calorie malnutrition: Secondary | ICD-10-CM | POA: Diagnosis not present

## 2017-10-17 DIAGNOSIS — D509 Iron deficiency anemia, unspecified: Secondary | ICD-10-CM | POA: Diagnosis not present

## 2017-10-20 DIAGNOSIS — D509 Iron deficiency anemia, unspecified: Secondary | ICD-10-CM | POA: Diagnosis not present

## 2017-10-20 DIAGNOSIS — E440 Moderate protein-calorie malnutrition: Secondary | ICD-10-CM | POA: Diagnosis not present

## 2017-10-20 DIAGNOSIS — N2581 Secondary hyperparathyroidism of renal origin: Secondary | ICD-10-CM | POA: Diagnosis not present

## 2017-10-20 DIAGNOSIS — D631 Anemia in chronic kidney disease: Secondary | ICD-10-CM | POA: Diagnosis not present

## 2017-10-20 DIAGNOSIS — N186 End stage renal disease: Secondary | ICD-10-CM | POA: Diagnosis not present

## 2017-10-20 DIAGNOSIS — K769 Liver disease, unspecified: Secondary | ICD-10-CM | POA: Diagnosis not present

## 2017-10-21 DIAGNOSIS — N2581 Secondary hyperparathyroidism of renal origin: Secondary | ICD-10-CM | POA: Diagnosis not present

## 2017-10-21 DIAGNOSIS — D509 Iron deficiency anemia, unspecified: Secondary | ICD-10-CM | POA: Diagnosis not present

## 2017-10-21 DIAGNOSIS — E440 Moderate protein-calorie malnutrition: Secondary | ICD-10-CM | POA: Diagnosis not present

## 2017-10-21 DIAGNOSIS — K769 Liver disease, unspecified: Secondary | ICD-10-CM | POA: Diagnosis not present

## 2017-10-21 DIAGNOSIS — D631 Anemia in chronic kidney disease: Secondary | ICD-10-CM | POA: Diagnosis not present

## 2017-10-21 DIAGNOSIS — N186 End stage renal disease: Secondary | ICD-10-CM | POA: Diagnosis not present

## 2017-10-22 DIAGNOSIS — N2581 Secondary hyperparathyroidism of renal origin: Secondary | ICD-10-CM | POA: Diagnosis not present

## 2017-10-22 DIAGNOSIS — N186 End stage renal disease: Secondary | ICD-10-CM | POA: Diagnosis not present

## 2017-10-22 DIAGNOSIS — E440 Moderate protein-calorie malnutrition: Secondary | ICD-10-CM | POA: Diagnosis not present

## 2017-10-22 DIAGNOSIS — K769 Liver disease, unspecified: Secondary | ICD-10-CM | POA: Diagnosis not present

## 2017-10-22 DIAGNOSIS — D631 Anemia in chronic kidney disease: Secondary | ICD-10-CM | POA: Diagnosis not present

## 2017-10-22 DIAGNOSIS — D509 Iron deficiency anemia, unspecified: Secondary | ICD-10-CM | POA: Diagnosis not present

## 2017-10-23 ENCOUNTER — Other Ambulatory Visit: Payer: Self-pay | Admitting: Neurology

## 2017-10-23 DIAGNOSIS — D509 Iron deficiency anemia, unspecified: Secondary | ICD-10-CM | POA: Diagnosis not present

## 2017-10-23 DIAGNOSIS — N2581 Secondary hyperparathyroidism of renal origin: Secondary | ICD-10-CM | POA: Diagnosis not present

## 2017-10-23 DIAGNOSIS — K769 Liver disease, unspecified: Secondary | ICD-10-CM | POA: Diagnosis not present

## 2017-10-23 DIAGNOSIS — N186 End stage renal disease: Secondary | ICD-10-CM | POA: Diagnosis not present

## 2017-10-23 DIAGNOSIS — D631 Anemia in chronic kidney disease: Secondary | ICD-10-CM | POA: Diagnosis not present

## 2017-10-23 DIAGNOSIS — E440 Moderate protein-calorie malnutrition: Secondary | ICD-10-CM | POA: Diagnosis not present

## 2017-10-24 DIAGNOSIS — N186 End stage renal disease: Secondary | ICD-10-CM | POA: Diagnosis not present

## 2017-10-24 DIAGNOSIS — D509 Iron deficiency anemia, unspecified: Secondary | ICD-10-CM | POA: Diagnosis not present

## 2017-10-24 DIAGNOSIS — E440 Moderate protein-calorie malnutrition: Secondary | ICD-10-CM | POA: Diagnosis not present

## 2017-10-24 DIAGNOSIS — D631 Anemia in chronic kidney disease: Secondary | ICD-10-CM | POA: Diagnosis not present

## 2017-10-24 DIAGNOSIS — K769 Liver disease, unspecified: Secondary | ICD-10-CM | POA: Diagnosis not present

## 2017-10-24 DIAGNOSIS — N2581 Secondary hyperparathyroidism of renal origin: Secondary | ICD-10-CM | POA: Diagnosis not present

## 2017-10-27 DIAGNOSIS — D631 Anemia in chronic kidney disease: Secondary | ICD-10-CM | POA: Diagnosis not present

## 2017-10-27 DIAGNOSIS — N186 End stage renal disease: Secondary | ICD-10-CM | POA: Diagnosis not present

## 2017-10-27 DIAGNOSIS — E440 Moderate protein-calorie malnutrition: Secondary | ICD-10-CM | POA: Diagnosis not present

## 2017-10-27 DIAGNOSIS — N2581 Secondary hyperparathyroidism of renal origin: Secondary | ICD-10-CM | POA: Diagnosis not present

## 2017-10-27 DIAGNOSIS — K769 Liver disease, unspecified: Secondary | ICD-10-CM | POA: Diagnosis not present

## 2017-10-27 DIAGNOSIS — D509 Iron deficiency anemia, unspecified: Secondary | ICD-10-CM | POA: Diagnosis not present

## 2017-10-29 DIAGNOSIS — D509 Iron deficiency anemia, unspecified: Secondary | ICD-10-CM | POA: Diagnosis not present

## 2017-10-29 DIAGNOSIS — K769 Liver disease, unspecified: Secondary | ICD-10-CM | POA: Diagnosis not present

## 2017-10-29 DIAGNOSIS — E440 Moderate protein-calorie malnutrition: Secondary | ICD-10-CM | POA: Diagnosis not present

## 2017-10-29 DIAGNOSIS — N2581 Secondary hyperparathyroidism of renal origin: Secondary | ICD-10-CM | POA: Diagnosis not present

## 2017-10-29 DIAGNOSIS — N186 End stage renal disease: Secondary | ICD-10-CM | POA: Diagnosis not present

## 2017-10-29 DIAGNOSIS — D631 Anemia in chronic kidney disease: Secondary | ICD-10-CM | POA: Diagnosis not present

## 2017-10-30 DIAGNOSIS — D631 Anemia in chronic kidney disease: Secondary | ICD-10-CM | POA: Diagnosis not present

## 2017-10-30 DIAGNOSIS — D509 Iron deficiency anemia, unspecified: Secondary | ICD-10-CM | POA: Diagnosis not present

## 2017-10-30 DIAGNOSIS — N2581 Secondary hyperparathyroidism of renal origin: Secondary | ICD-10-CM | POA: Diagnosis not present

## 2017-10-30 DIAGNOSIS — N186 End stage renal disease: Secondary | ICD-10-CM | POA: Diagnosis not present

## 2017-10-30 DIAGNOSIS — K769 Liver disease, unspecified: Secondary | ICD-10-CM | POA: Diagnosis not present

## 2017-10-30 DIAGNOSIS — E440 Moderate protein-calorie malnutrition: Secondary | ICD-10-CM | POA: Diagnosis not present

## 2017-10-31 DIAGNOSIS — D631 Anemia in chronic kidney disease: Secondary | ICD-10-CM | POA: Diagnosis not present

## 2017-10-31 DIAGNOSIS — N186 End stage renal disease: Secondary | ICD-10-CM | POA: Diagnosis not present

## 2017-10-31 DIAGNOSIS — N2581 Secondary hyperparathyroidism of renal origin: Secondary | ICD-10-CM | POA: Diagnosis not present

## 2017-10-31 DIAGNOSIS — K769 Liver disease, unspecified: Secondary | ICD-10-CM | POA: Diagnosis not present

## 2017-10-31 DIAGNOSIS — D509 Iron deficiency anemia, unspecified: Secondary | ICD-10-CM | POA: Diagnosis not present

## 2017-10-31 DIAGNOSIS — E440 Moderate protein-calorie malnutrition: Secondary | ICD-10-CM | POA: Diagnosis not present

## 2017-11-03 DIAGNOSIS — N186 End stage renal disease: Secondary | ICD-10-CM | POA: Diagnosis not present

## 2017-11-03 DIAGNOSIS — N2581 Secondary hyperparathyroidism of renal origin: Secondary | ICD-10-CM | POA: Diagnosis not present

## 2017-11-03 DIAGNOSIS — K769 Liver disease, unspecified: Secondary | ICD-10-CM | POA: Diagnosis not present

## 2017-11-03 DIAGNOSIS — D509 Iron deficiency anemia, unspecified: Secondary | ICD-10-CM | POA: Diagnosis not present

## 2017-11-03 DIAGNOSIS — D631 Anemia in chronic kidney disease: Secondary | ICD-10-CM | POA: Diagnosis not present

## 2017-11-03 DIAGNOSIS — E440 Moderate protein-calorie malnutrition: Secondary | ICD-10-CM | POA: Diagnosis not present

## 2017-11-05 DIAGNOSIS — D631 Anemia in chronic kidney disease: Secondary | ICD-10-CM | POA: Diagnosis not present

## 2017-11-05 DIAGNOSIS — N186 End stage renal disease: Secondary | ICD-10-CM | POA: Diagnosis not present

## 2017-11-05 DIAGNOSIS — D509 Iron deficiency anemia, unspecified: Secondary | ICD-10-CM | POA: Diagnosis not present

## 2017-11-05 DIAGNOSIS — N2581 Secondary hyperparathyroidism of renal origin: Secondary | ICD-10-CM | POA: Diagnosis not present

## 2017-11-05 DIAGNOSIS — K769 Liver disease, unspecified: Secondary | ICD-10-CM | POA: Diagnosis not present

## 2017-11-05 DIAGNOSIS — E440 Moderate protein-calorie malnutrition: Secondary | ICD-10-CM | POA: Diagnosis not present

## 2017-11-06 DIAGNOSIS — D631 Anemia in chronic kidney disease: Secondary | ICD-10-CM | POA: Diagnosis not present

## 2017-11-06 DIAGNOSIS — D509 Iron deficiency anemia, unspecified: Secondary | ICD-10-CM | POA: Diagnosis not present

## 2017-11-06 DIAGNOSIS — N2581 Secondary hyperparathyroidism of renal origin: Secondary | ICD-10-CM | POA: Diagnosis not present

## 2017-11-06 DIAGNOSIS — E440 Moderate protein-calorie malnutrition: Secondary | ICD-10-CM | POA: Diagnosis not present

## 2017-11-06 DIAGNOSIS — K769 Liver disease, unspecified: Secondary | ICD-10-CM | POA: Diagnosis not present

## 2017-11-06 DIAGNOSIS — N186 End stage renal disease: Secondary | ICD-10-CM | POA: Diagnosis not present

## 2017-11-07 DIAGNOSIS — D509 Iron deficiency anemia, unspecified: Secondary | ICD-10-CM | POA: Diagnosis not present

## 2017-11-07 DIAGNOSIS — E440 Moderate protein-calorie malnutrition: Secondary | ICD-10-CM | POA: Diagnosis not present

## 2017-11-07 DIAGNOSIS — N2581 Secondary hyperparathyroidism of renal origin: Secondary | ICD-10-CM | POA: Diagnosis not present

## 2017-11-07 DIAGNOSIS — K769 Liver disease, unspecified: Secondary | ICD-10-CM | POA: Diagnosis not present

## 2017-11-07 DIAGNOSIS — D631 Anemia in chronic kidney disease: Secondary | ICD-10-CM | POA: Diagnosis not present

## 2017-11-07 DIAGNOSIS — N186 End stage renal disease: Secondary | ICD-10-CM | POA: Diagnosis not present

## 2017-11-10 DIAGNOSIS — D631 Anemia in chronic kidney disease: Secondary | ICD-10-CM | POA: Diagnosis not present

## 2017-11-10 DIAGNOSIS — E440 Moderate protein-calorie malnutrition: Secondary | ICD-10-CM | POA: Diagnosis not present

## 2017-11-10 DIAGNOSIS — D509 Iron deficiency anemia, unspecified: Secondary | ICD-10-CM | POA: Diagnosis not present

## 2017-11-10 DIAGNOSIS — B20 Human immunodeficiency virus [HIV] disease: Secondary | ICD-10-CM | POA: Diagnosis not present

## 2017-11-10 DIAGNOSIS — N2581 Secondary hyperparathyroidism of renal origin: Secondary | ICD-10-CM | POA: Diagnosis not present

## 2017-11-10 DIAGNOSIS — N186 End stage renal disease: Secondary | ICD-10-CM | POA: Diagnosis not present

## 2017-11-10 DIAGNOSIS — K769 Liver disease, unspecified: Secondary | ICD-10-CM | POA: Diagnosis not present

## 2017-11-10 DIAGNOSIS — Z992 Dependence on renal dialysis: Secondary | ICD-10-CM | POA: Diagnosis not present

## 2017-11-12 DIAGNOSIS — Z4932 Encounter for adequacy testing for peritoneal dialysis: Secondary | ICD-10-CM | POA: Diagnosis not present

## 2017-11-12 DIAGNOSIS — Z4931 Encounter for adequacy testing for hemodialysis: Secondary | ICD-10-CM | POA: Diagnosis not present

## 2017-11-12 DIAGNOSIS — E8779 Other fluid overload: Secondary | ICD-10-CM | POA: Diagnosis not present

## 2017-11-12 DIAGNOSIS — E7849 Other hyperlipidemia: Secondary | ICD-10-CM | POA: Diagnosis not present

## 2017-11-12 DIAGNOSIS — N186 End stage renal disease: Secondary | ICD-10-CM | POA: Diagnosis not present

## 2017-11-12 DIAGNOSIS — N2581 Secondary hyperparathyroidism of renal origin: Secondary | ICD-10-CM | POA: Diagnosis not present

## 2017-11-12 DIAGNOSIS — K7689 Other specified diseases of liver: Secondary | ICD-10-CM | POA: Diagnosis not present

## 2017-11-12 DIAGNOSIS — N2589 Other disorders resulting from impaired renal tubular function: Secondary | ICD-10-CM | POA: Diagnosis not present

## 2017-11-12 DIAGNOSIS — D631 Anemia in chronic kidney disease: Secondary | ICD-10-CM | POA: Diagnosis not present

## 2017-11-12 DIAGNOSIS — D509 Iron deficiency anemia, unspecified: Secondary | ICD-10-CM | POA: Diagnosis not present

## 2017-11-12 DIAGNOSIS — K769 Liver disease, unspecified: Secondary | ICD-10-CM | POA: Diagnosis not present

## 2017-11-13 DIAGNOSIS — N2589 Other disorders resulting from impaired renal tubular function: Secondary | ICD-10-CM | POA: Diagnosis not present

## 2017-11-13 DIAGNOSIS — D509 Iron deficiency anemia, unspecified: Secondary | ICD-10-CM | POA: Diagnosis not present

## 2017-11-13 DIAGNOSIS — K7689 Other specified diseases of liver: Secondary | ICD-10-CM | POA: Diagnosis not present

## 2017-11-13 DIAGNOSIS — D631 Anemia in chronic kidney disease: Secondary | ICD-10-CM | POA: Diagnosis not present

## 2017-11-13 DIAGNOSIS — N186 End stage renal disease: Secondary | ICD-10-CM | POA: Diagnosis not present

## 2017-11-13 DIAGNOSIS — Z4931 Encounter for adequacy testing for hemodialysis: Secondary | ICD-10-CM | POA: Diagnosis not present

## 2017-11-14 DIAGNOSIS — K7689 Other specified diseases of liver: Secondary | ICD-10-CM | POA: Diagnosis not present

## 2017-11-14 DIAGNOSIS — D631 Anemia in chronic kidney disease: Secondary | ICD-10-CM | POA: Diagnosis not present

## 2017-11-14 DIAGNOSIS — N186 End stage renal disease: Secondary | ICD-10-CM | POA: Diagnosis not present

## 2017-11-14 DIAGNOSIS — D509 Iron deficiency anemia, unspecified: Secondary | ICD-10-CM | POA: Diagnosis not present

## 2017-11-14 DIAGNOSIS — N2589 Other disorders resulting from impaired renal tubular function: Secondary | ICD-10-CM | POA: Diagnosis not present

## 2017-11-14 DIAGNOSIS — Z4931 Encounter for adequacy testing for hemodialysis: Secondary | ICD-10-CM | POA: Diagnosis not present

## 2017-11-17 DIAGNOSIS — K7689 Other specified diseases of liver: Secondary | ICD-10-CM | POA: Diagnosis not present

## 2017-11-17 DIAGNOSIS — N186 End stage renal disease: Secondary | ICD-10-CM | POA: Diagnosis not present

## 2017-11-17 DIAGNOSIS — D509 Iron deficiency anemia, unspecified: Secondary | ICD-10-CM | POA: Diagnosis not present

## 2017-11-17 DIAGNOSIS — Z4931 Encounter for adequacy testing for hemodialysis: Secondary | ICD-10-CM | POA: Diagnosis not present

## 2017-11-17 DIAGNOSIS — D631 Anemia in chronic kidney disease: Secondary | ICD-10-CM | POA: Diagnosis not present

## 2017-11-17 DIAGNOSIS — N2589 Other disorders resulting from impaired renal tubular function: Secondary | ICD-10-CM | POA: Diagnosis not present

## 2017-11-19 DIAGNOSIS — N2589 Other disorders resulting from impaired renal tubular function: Secondary | ICD-10-CM | POA: Diagnosis not present

## 2017-11-19 DIAGNOSIS — Z4931 Encounter for adequacy testing for hemodialysis: Secondary | ICD-10-CM | POA: Diagnosis not present

## 2017-11-19 DIAGNOSIS — K7689 Other specified diseases of liver: Secondary | ICD-10-CM | POA: Diagnosis not present

## 2017-11-19 DIAGNOSIS — N186 End stage renal disease: Secondary | ICD-10-CM | POA: Diagnosis not present

## 2017-11-19 DIAGNOSIS — D509 Iron deficiency anemia, unspecified: Secondary | ICD-10-CM | POA: Diagnosis not present

## 2017-11-19 DIAGNOSIS — D631 Anemia in chronic kidney disease: Secondary | ICD-10-CM | POA: Diagnosis not present

## 2017-11-20 DIAGNOSIS — N186 End stage renal disease: Secondary | ICD-10-CM | POA: Diagnosis not present

## 2017-11-20 DIAGNOSIS — D631 Anemia in chronic kidney disease: Secondary | ICD-10-CM | POA: Diagnosis not present

## 2017-11-20 DIAGNOSIS — D509 Iron deficiency anemia, unspecified: Secondary | ICD-10-CM | POA: Diagnosis not present

## 2017-11-20 DIAGNOSIS — K7689 Other specified diseases of liver: Secondary | ICD-10-CM | POA: Diagnosis not present

## 2017-11-20 DIAGNOSIS — Z4931 Encounter for adequacy testing for hemodialysis: Secondary | ICD-10-CM | POA: Diagnosis not present

## 2017-11-20 DIAGNOSIS — N2589 Other disorders resulting from impaired renal tubular function: Secondary | ICD-10-CM | POA: Diagnosis not present

## 2017-11-21 DIAGNOSIS — Z4931 Encounter for adequacy testing for hemodialysis: Secondary | ICD-10-CM | POA: Diagnosis not present

## 2017-11-21 DIAGNOSIS — N2589 Other disorders resulting from impaired renal tubular function: Secondary | ICD-10-CM | POA: Diagnosis not present

## 2017-11-21 DIAGNOSIS — D509 Iron deficiency anemia, unspecified: Secondary | ICD-10-CM | POA: Diagnosis not present

## 2017-11-21 DIAGNOSIS — D631 Anemia in chronic kidney disease: Secondary | ICD-10-CM | POA: Diagnosis not present

## 2017-11-21 DIAGNOSIS — N186 End stage renal disease: Secondary | ICD-10-CM | POA: Diagnosis not present

## 2017-11-21 DIAGNOSIS — K7689 Other specified diseases of liver: Secondary | ICD-10-CM | POA: Diagnosis not present

## 2017-11-24 DIAGNOSIS — Z4931 Encounter for adequacy testing for hemodialysis: Secondary | ICD-10-CM | POA: Diagnosis not present

## 2017-11-24 DIAGNOSIS — D631 Anemia in chronic kidney disease: Secondary | ICD-10-CM | POA: Diagnosis not present

## 2017-11-24 DIAGNOSIS — N2589 Other disorders resulting from impaired renal tubular function: Secondary | ICD-10-CM | POA: Diagnosis not present

## 2017-11-24 DIAGNOSIS — K7689 Other specified diseases of liver: Secondary | ICD-10-CM | POA: Diagnosis not present

## 2017-11-24 DIAGNOSIS — N186 End stage renal disease: Secondary | ICD-10-CM | POA: Diagnosis not present

## 2017-11-24 DIAGNOSIS — D509 Iron deficiency anemia, unspecified: Secondary | ICD-10-CM | POA: Diagnosis not present

## 2017-11-26 DIAGNOSIS — Z4931 Encounter for adequacy testing for hemodialysis: Secondary | ICD-10-CM | POA: Diagnosis not present

## 2017-11-26 DIAGNOSIS — N2589 Other disorders resulting from impaired renal tubular function: Secondary | ICD-10-CM | POA: Diagnosis not present

## 2017-11-26 DIAGNOSIS — K7689 Other specified diseases of liver: Secondary | ICD-10-CM | POA: Diagnosis not present

## 2017-11-26 DIAGNOSIS — D509 Iron deficiency anemia, unspecified: Secondary | ICD-10-CM | POA: Diagnosis not present

## 2017-11-26 DIAGNOSIS — D631 Anemia in chronic kidney disease: Secondary | ICD-10-CM | POA: Diagnosis not present

## 2017-11-26 DIAGNOSIS — N186 End stage renal disease: Secondary | ICD-10-CM | POA: Diagnosis not present

## 2017-11-27 DIAGNOSIS — N186 End stage renal disease: Secondary | ICD-10-CM | POA: Diagnosis not present

## 2017-11-27 DIAGNOSIS — D509 Iron deficiency anemia, unspecified: Secondary | ICD-10-CM | POA: Diagnosis not present

## 2017-11-27 DIAGNOSIS — Z4931 Encounter for adequacy testing for hemodialysis: Secondary | ICD-10-CM | POA: Diagnosis not present

## 2017-11-27 DIAGNOSIS — K7689 Other specified diseases of liver: Secondary | ICD-10-CM | POA: Diagnosis not present

## 2017-11-27 DIAGNOSIS — D631 Anemia in chronic kidney disease: Secondary | ICD-10-CM | POA: Diagnosis not present

## 2017-11-27 DIAGNOSIS — N2589 Other disorders resulting from impaired renal tubular function: Secondary | ICD-10-CM | POA: Diagnosis not present

## 2017-11-28 DIAGNOSIS — D509 Iron deficiency anemia, unspecified: Secondary | ICD-10-CM | POA: Diagnosis not present

## 2017-11-28 DIAGNOSIS — K7689 Other specified diseases of liver: Secondary | ICD-10-CM | POA: Diagnosis not present

## 2017-11-28 DIAGNOSIS — D631 Anemia in chronic kidney disease: Secondary | ICD-10-CM | POA: Diagnosis not present

## 2017-11-28 DIAGNOSIS — N2589 Other disorders resulting from impaired renal tubular function: Secondary | ICD-10-CM | POA: Diagnosis not present

## 2017-11-28 DIAGNOSIS — Z4931 Encounter for adequacy testing for hemodialysis: Secondary | ICD-10-CM | POA: Diagnosis not present

## 2017-11-28 DIAGNOSIS — N186 End stage renal disease: Secondary | ICD-10-CM | POA: Diagnosis not present

## 2017-12-01 DIAGNOSIS — N2589 Other disorders resulting from impaired renal tubular function: Secondary | ICD-10-CM | POA: Diagnosis not present

## 2017-12-01 DIAGNOSIS — D509 Iron deficiency anemia, unspecified: Secondary | ICD-10-CM | POA: Diagnosis not present

## 2017-12-01 DIAGNOSIS — D631 Anemia in chronic kidney disease: Secondary | ICD-10-CM | POA: Diagnosis not present

## 2017-12-01 DIAGNOSIS — K7689 Other specified diseases of liver: Secondary | ICD-10-CM | POA: Diagnosis not present

## 2017-12-01 DIAGNOSIS — N186 End stage renal disease: Secondary | ICD-10-CM | POA: Diagnosis not present

## 2017-12-01 DIAGNOSIS — Z4931 Encounter for adequacy testing for hemodialysis: Secondary | ICD-10-CM | POA: Diagnosis not present

## 2017-12-03 DIAGNOSIS — K7689 Other specified diseases of liver: Secondary | ICD-10-CM | POA: Diagnosis not present

## 2017-12-03 DIAGNOSIS — D631 Anemia in chronic kidney disease: Secondary | ICD-10-CM | POA: Diagnosis not present

## 2017-12-03 DIAGNOSIS — Z4931 Encounter for adequacy testing for hemodialysis: Secondary | ICD-10-CM | POA: Diagnosis not present

## 2017-12-03 DIAGNOSIS — N2589 Other disorders resulting from impaired renal tubular function: Secondary | ICD-10-CM | POA: Diagnosis not present

## 2017-12-03 DIAGNOSIS — N186 End stage renal disease: Secondary | ICD-10-CM | POA: Diagnosis not present

## 2017-12-03 DIAGNOSIS — D509 Iron deficiency anemia, unspecified: Secondary | ICD-10-CM | POA: Diagnosis not present

## 2017-12-04 DIAGNOSIS — D631 Anemia in chronic kidney disease: Secondary | ICD-10-CM | POA: Diagnosis not present

## 2017-12-04 DIAGNOSIS — K7689 Other specified diseases of liver: Secondary | ICD-10-CM | POA: Diagnosis not present

## 2017-12-04 DIAGNOSIS — N186 End stage renal disease: Secondary | ICD-10-CM | POA: Diagnosis not present

## 2017-12-04 DIAGNOSIS — N2589 Other disorders resulting from impaired renal tubular function: Secondary | ICD-10-CM | POA: Diagnosis not present

## 2017-12-04 DIAGNOSIS — Z4931 Encounter for adequacy testing for hemodialysis: Secondary | ICD-10-CM | POA: Diagnosis not present

## 2017-12-04 DIAGNOSIS — D509 Iron deficiency anemia, unspecified: Secondary | ICD-10-CM | POA: Diagnosis not present

## 2017-12-05 DIAGNOSIS — Z4931 Encounter for adequacy testing for hemodialysis: Secondary | ICD-10-CM | POA: Diagnosis not present

## 2017-12-05 DIAGNOSIS — N2589 Other disorders resulting from impaired renal tubular function: Secondary | ICD-10-CM | POA: Diagnosis not present

## 2017-12-05 DIAGNOSIS — D509 Iron deficiency anemia, unspecified: Secondary | ICD-10-CM | POA: Diagnosis not present

## 2017-12-05 DIAGNOSIS — N186 End stage renal disease: Secondary | ICD-10-CM | POA: Diagnosis not present

## 2017-12-05 DIAGNOSIS — Z992 Dependence on renal dialysis: Secondary | ICD-10-CM | POA: Diagnosis not present

## 2017-12-05 DIAGNOSIS — K7689 Other specified diseases of liver: Secondary | ICD-10-CM | POA: Diagnosis not present

## 2017-12-05 DIAGNOSIS — D631 Anemia in chronic kidney disease: Secondary | ICD-10-CM | POA: Diagnosis not present

## 2017-12-05 DIAGNOSIS — I871 Compression of vein: Secondary | ICD-10-CM | POA: Diagnosis not present

## 2017-12-08 DIAGNOSIS — Z4931 Encounter for adequacy testing for hemodialysis: Secondary | ICD-10-CM | POA: Diagnosis not present

## 2017-12-08 DIAGNOSIS — D509 Iron deficiency anemia, unspecified: Secondary | ICD-10-CM | POA: Diagnosis not present

## 2017-12-08 DIAGNOSIS — N2589 Other disorders resulting from impaired renal tubular function: Secondary | ICD-10-CM | POA: Diagnosis not present

## 2017-12-08 DIAGNOSIS — K7689 Other specified diseases of liver: Secondary | ICD-10-CM | POA: Diagnosis not present

## 2017-12-08 DIAGNOSIS — D631 Anemia in chronic kidney disease: Secondary | ICD-10-CM | POA: Diagnosis not present

## 2017-12-08 DIAGNOSIS — N186 End stage renal disease: Secondary | ICD-10-CM | POA: Diagnosis not present

## 2017-12-10 DIAGNOSIS — D631 Anemia in chronic kidney disease: Secondary | ICD-10-CM | POA: Diagnosis not present

## 2017-12-10 DIAGNOSIS — Z4931 Encounter for adequacy testing for hemodialysis: Secondary | ICD-10-CM | POA: Diagnosis not present

## 2017-12-10 DIAGNOSIS — N2589 Other disorders resulting from impaired renal tubular function: Secondary | ICD-10-CM | POA: Diagnosis not present

## 2017-12-10 DIAGNOSIS — K7689 Other specified diseases of liver: Secondary | ICD-10-CM | POA: Diagnosis not present

## 2017-12-10 DIAGNOSIS — N186 End stage renal disease: Secondary | ICD-10-CM | POA: Diagnosis not present

## 2017-12-10 DIAGNOSIS — D509 Iron deficiency anemia, unspecified: Secondary | ICD-10-CM | POA: Diagnosis not present

## 2017-12-11 DIAGNOSIS — B20 Human immunodeficiency virus [HIV] disease: Secondary | ICD-10-CM | POA: Diagnosis not present

## 2017-12-11 DIAGNOSIS — Z4931 Encounter for adequacy testing for hemodialysis: Secondary | ICD-10-CM | POA: Diagnosis not present

## 2017-12-11 DIAGNOSIS — N2589 Other disorders resulting from impaired renal tubular function: Secondary | ICD-10-CM | POA: Diagnosis not present

## 2017-12-11 DIAGNOSIS — K7689 Other specified diseases of liver: Secondary | ICD-10-CM | POA: Diagnosis not present

## 2017-12-11 DIAGNOSIS — N186 End stage renal disease: Secondary | ICD-10-CM | POA: Diagnosis not present

## 2017-12-11 DIAGNOSIS — D631 Anemia in chronic kidney disease: Secondary | ICD-10-CM | POA: Diagnosis not present

## 2017-12-11 DIAGNOSIS — D509 Iron deficiency anemia, unspecified: Secondary | ICD-10-CM | POA: Diagnosis not present

## 2017-12-11 DIAGNOSIS — Z992 Dependence on renal dialysis: Secondary | ICD-10-CM | POA: Diagnosis not present

## 2017-12-12 DIAGNOSIS — D631 Anemia in chronic kidney disease: Secondary | ICD-10-CM | POA: Diagnosis not present

## 2017-12-12 DIAGNOSIS — D509 Iron deficiency anemia, unspecified: Secondary | ICD-10-CM | POA: Diagnosis not present

## 2017-12-12 DIAGNOSIS — Z992 Dependence on renal dialysis: Secondary | ICD-10-CM | POA: Diagnosis not present

## 2017-12-12 DIAGNOSIS — B20 Human immunodeficiency virus [HIV] disease: Secondary | ICD-10-CM | POA: Diagnosis not present

## 2017-12-12 DIAGNOSIS — N186 End stage renal disease: Secondary | ICD-10-CM | POA: Diagnosis not present

## 2017-12-12 DIAGNOSIS — E8779 Other fluid overload: Secondary | ICD-10-CM | POA: Diagnosis not present

## 2017-12-12 DIAGNOSIS — Z23 Encounter for immunization: Secondary | ICD-10-CM | POA: Diagnosis not present

## 2017-12-12 DIAGNOSIS — N2581 Secondary hyperparathyroidism of renal origin: Secondary | ICD-10-CM | POA: Diagnosis not present

## 2017-12-15 DIAGNOSIS — D631 Anemia in chronic kidney disease: Secondary | ICD-10-CM | POA: Diagnosis not present

## 2017-12-15 DIAGNOSIS — N186 End stage renal disease: Secondary | ICD-10-CM | POA: Diagnosis not present

## 2017-12-15 DIAGNOSIS — E8779 Other fluid overload: Secondary | ICD-10-CM | POA: Diagnosis not present

## 2017-12-15 DIAGNOSIS — Z23 Encounter for immunization: Secondary | ICD-10-CM | POA: Diagnosis not present

## 2017-12-15 DIAGNOSIS — D509 Iron deficiency anemia, unspecified: Secondary | ICD-10-CM | POA: Diagnosis not present

## 2017-12-16 DIAGNOSIS — Z23 Encounter for immunization: Secondary | ICD-10-CM | POA: Diagnosis not present

## 2017-12-16 DIAGNOSIS — N186 End stage renal disease: Secondary | ICD-10-CM | POA: Diagnosis not present

## 2017-12-16 DIAGNOSIS — D509 Iron deficiency anemia, unspecified: Secondary | ICD-10-CM | POA: Diagnosis not present

## 2017-12-16 DIAGNOSIS — E8779 Other fluid overload: Secondary | ICD-10-CM | POA: Diagnosis not present

## 2017-12-16 DIAGNOSIS — D631 Anemia in chronic kidney disease: Secondary | ICD-10-CM | POA: Diagnosis not present

## 2017-12-17 DIAGNOSIS — E8779 Other fluid overload: Secondary | ICD-10-CM | POA: Diagnosis not present

## 2017-12-17 DIAGNOSIS — Z23 Encounter for immunization: Secondary | ICD-10-CM | POA: Diagnosis not present

## 2017-12-17 DIAGNOSIS — D509 Iron deficiency anemia, unspecified: Secondary | ICD-10-CM | POA: Diagnosis not present

## 2017-12-17 DIAGNOSIS — N186 End stage renal disease: Secondary | ICD-10-CM | POA: Diagnosis not present

## 2017-12-17 DIAGNOSIS — D631 Anemia in chronic kidney disease: Secondary | ICD-10-CM | POA: Diagnosis not present

## 2017-12-18 DIAGNOSIS — D509 Iron deficiency anemia, unspecified: Secondary | ICD-10-CM | POA: Diagnosis not present

## 2017-12-18 DIAGNOSIS — D631 Anemia in chronic kidney disease: Secondary | ICD-10-CM | POA: Diagnosis not present

## 2017-12-18 DIAGNOSIS — Z23 Encounter for immunization: Secondary | ICD-10-CM | POA: Diagnosis not present

## 2017-12-18 DIAGNOSIS — E8779 Other fluid overload: Secondary | ICD-10-CM | POA: Diagnosis not present

## 2017-12-18 DIAGNOSIS — N186 End stage renal disease: Secondary | ICD-10-CM | POA: Diagnosis not present

## 2017-12-19 DIAGNOSIS — Z23 Encounter for immunization: Secondary | ICD-10-CM | POA: Diagnosis not present

## 2017-12-19 DIAGNOSIS — N186 End stage renal disease: Secondary | ICD-10-CM | POA: Diagnosis not present

## 2017-12-19 DIAGNOSIS — E8779 Other fluid overload: Secondary | ICD-10-CM | POA: Diagnosis not present

## 2017-12-19 DIAGNOSIS — D631 Anemia in chronic kidney disease: Secondary | ICD-10-CM | POA: Diagnosis not present

## 2017-12-19 DIAGNOSIS — D509 Iron deficiency anemia, unspecified: Secondary | ICD-10-CM | POA: Diagnosis not present

## 2017-12-22 DIAGNOSIS — D509 Iron deficiency anemia, unspecified: Secondary | ICD-10-CM | POA: Diagnosis not present

## 2017-12-22 DIAGNOSIS — D631 Anemia in chronic kidney disease: Secondary | ICD-10-CM | POA: Diagnosis not present

## 2017-12-22 DIAGNOSIS — E8779 Other fluid overload: Secondary | ICD-10-CM | POA: Diagnosis not present

## 2017-12-22 DIAGNOSIS — Z23 Encounter for immunization: Secondary | ICD-10-CM | POA: Diagnosis not present

## 2017-12-22 DIAGNOSIS — N186 End stage renal disease: Secondary | ICD-10-CM | POA: Diagnosis not present

## 2017-12-24 DIAGNOSIS — N186 End stage renal disease: Secondary | ICD-10-CM | POA: Diagnosis not present

## 2017-12-24 DIAGNOSIS — Z23 Encounter for immunization: Secondary | ICD-10-CM | POA: Diagnosis not present

## 2017-12-24 DIAGNOSIS — D631 Anemia in chronic kidney disease: Secondary | ICD-10-CM | POA: Diagnosis not present

## 2017-12-24 DIAGNOSIS — E8779 Other fluid overload: Secondary | ICD-10-CM | POA: Diagnosis not present

## 2017-12-24 DIAGNOSIS — D509 Iron deficiency anemia, unspecified: Secondary | ICD-10-CM | POA: Diagnosis not present

## 2017-12-25 DIAGNOSIS — E8779 Other fluid overload: Secondary | ICD-10-CM | POA: Diagnosis not present

## 2017-12-25 DIAGNOSIS — D631 Anemia in chronic kidney disease: Secondary | ICD-10-CM | POA: Diagnosis not present

## 2017-12-25 DIAGNOSIS — N186 End stage renal disease: Secondary | ICD-10-CM | POA: Diagnosis not present

## 2017-12-25 DIAGNOSIS — Z23 Encounter for immunization: Secondary | ICD-10-CM | POA: Diagnosis not present

## 2017-12-25 DIAGNOSIS — D509 Iron deficiency anemia, unspecified: Secondary | ICD-10-CM | POA: Diagnosis not present

## 2017-12-26 DIAGNOSIS — Z23 Encounter for immunization: Secondary | ICD-10-CM | POA: Diagnosis not present

## 2017-12-26 DIAGNOSIS — D631 Anemia in chronic kidney disease: Secondary | ICD-10-CM | POA: Diagnosis not present

## 2017-12-26 DIAGNOSIS — D509 Iron deficiency anemia, unspecified: Secondary | ICD-10-CM | POA: Diagnosis not present

## 2017-12-26 DIAGNOSIS — N186 End stage renal disease: Secondary | ICD-10-CM | POA: Diagnosis not present

## 2017-12-26 DIAGNOSIS — E8779 Other fluid overload: Secondary | ICD-10-CM | POA: Diagnosis not present

## 2017-12-29 DIAGNOSIS — I10 Essential (primary) hypertension: Secondary | ICD-10-CM | POA: Diagnosis not present

## 2017-12-29 DIAGNOSIS — N186 End stage renal disease: Secondary | ICD-10-CM | POA: Diagnosis not present

## 2017-12-29 DIAGNOSIS — N08 Glomerular disorders in diseases classified elsewhere: Secondary | ICD-10-CM | POA: Diagnosis not present

## 2017-12-29 DIAGNOSIS — Z23 Encounter for immunization: Secondary | ICD-10-CM | POA: Diagnosis not present

## 2017-12-29 DIAGNOSIS — D631 Anemia in chronic kidney disease: Secondary | ICD-10-CM | POA: Diagnosis not present

## 2017-12-29 DIAGNOSIS — B20 Human immunodeficiency virus [HIV] disease: Secondary | ICD-10-CM | POA: Diagnosis not present

## 2017-12-29 DIAGNOSIS — N289 Disorder of kidney and ureter, unspecified: Secondary | ICD-10-CM | POA: Diagnosis not present

## 2017-12-29 DIAGNOSIS — I120 Hypertensive chronic kidney disease with stage 5 chronic kidney disease or end stage renal disease: Secondary | ICD-10-CM | POA: Diagnosis not present

## 2017-12-29 DIAGNOSIS — D509 Iron deficiency anemia, unspecified: Secondary | ICD-10-CM | POA: Diagnosis not present

## 2017-12-29 DIAGNOSIS — E8779 Other fluid overload: Secondary | ICD-10-CM | POA: Diagnosis not present

## 2017-12-29 DIAGNOSIS — Z992 Dependence on renal dialysis: Secondary | ICD-10-CM | POA: Diagnosis not present

## 2017-12-31 DIAGNOSIS — Z23 Encounter for immunization: Secondary | ICD-10-CM | POA: Diagnosis not present

## 2017-12-31 DIAGNOSIS — D631 Anemia in chronic kidney disease: Secondary | ICD-10-CM | POA: Diagnosis not present

## 2017-12-31 DIAGNOSIS — D509 Iron deficiency anemia, unspecified: Secondary | ICD-10-CM | POA: Diagnosis not present

## 2017-12-31 DIAGNOSIS — N186 End stage renal disease: Secondary | ICD-10-CM | POA: Diagnosis not present

## 2017-12-31 DIAGNOSIS — E8779 Other fluid overload: Secondary | ICD-10-CM | POA: Diagnosis not present

## 2018-01-01 DIAGNOSIS — E8779 Other fluid overload: Secondary | ICD-10-CM | POA: Diagnosis not present

## 2018-01-01 DIAGNOSIS — N186 End stage renal disease: Secondary | ICD-10-CM | POA: Diagnosis not present

## 2018-01-01 DIAGNOSIS — D631 Anemia in chronic kidney disease: Secondary | ICD-10-CM | POA: Diagnosis not present

## 2018-01-01 DIAGNOSIS — D509 Iron deficiency anemia, unspecified: Secondary | ICD-10-CM | POA: Diagnosis not present

## 2018-01-01 DIAGNOSIS — Z23 Encounter for immunization: Secondary | ICD-10-CM | POA: Diagnosis not present

## 2018-01-02 DIAGNOSIS — E8779 Other fluid overload: Secondary | ICD-10-CM | POA: Diagnosis not present

## 2018-01-02 DIAGNOSIS — D509 Iron deficiency anemia, unspecified: Secondary | ICD-10-CM | POA: Diagnosis not present

## 2018-01-02 DIAGNOSIS — Z23 Encounter for immunization: Secondary | ICD-10-CM | POA: Diagnosis not present

## 2018-01-02 DIAGNOSIS — N186 End stage renal disease: Secondary | ICD-10-CM | POA: Diagnosis not present

## 2018-01-02 DIAGNOSIS — D631 Anemia in chronic kidney disease: Secondary | ICD-10-CM | POA: Diagnosis not present

## 2018-01-05 DIAGNOSIS — Z23 Encounter for immunization: Secondary | ICD-10-CM | POA: Diagnosis not present

## 2018-01-05 DIAGNOSIS — E8779 Other fluid overload: Secondary | ICD-10-CM | POA: Diagnosis not present

## 2018-01-05 DIAGNOSIS — D509 Iron deficiency anemia, unspecified: Secondary | ICD-10-CM | POA: Diagnosis not present

## 2018-01-05 DIAGNOSIS — N186 End stage renal disease: Secondary | ICD-10-CM | POA: Diagnosis not present

## 2018-01-05 DIAGNOSIS — D631 Anemia in chronic kidney disease: Secondary | ICD-10-CM | POA: Diagnosis not present

## 2018-01-07 DIAGNOSIS — D509 Iron deficiency anemia, unspecified: Secondary | ICD-10-CM | POA: Diagnosis not present

## 2018-01-07 DIAGNOSIS — Z23 Encounter for immunization: Secondary | ICD-10-CM | POA: Diagnosis not present

## 2018-01-07 DIAGNOSIS — E8779 Other fluid overload: Secondary | ICD-10-CM | POA: Diagnosis not present

## 2018-01-07 DIAGNOSIS — D631 Anemia in chronic kidney disease: Secondary | ICD-10-CM | POA: Diagnosis not present

## 2018-01-07 DIAGNOSIS — N186 End stage renal disease: Secondary | ICD-10-CM | POA: Diagnosis not present

## 2018-01-08 DIAGNOSIS — N186 End stage renal disease: Secondary | ICD-10-CM | POA: Diagnosis not present

## 2018-01-08 DIAGNOSIS — Z23 Encounter for immunization: Secondary | ICD-10-CM | POA: Diagnosis not present

## 2018-01-08 DIAGNOSIS — D631 Anemia in chronic kidney disease: Secondary | ICD-10-CM | POA: Diagnosis not present

## 2018-01-08 DIAGNOSIS — D509 Iron deficiency anemia, unspecified: Secondary | ICD-10-CM | POA: Diagnosis not present

## 2018-01-08 DIAGNOSIS — E8779 Other fluid overload: Secondary | ICD-10-CM | POA: Diagnosis not present

## 2018-01-09 DIAGNOSIS — E440 Moderate protein-calorie malnutrition: Secondary | ICD-10-CM | POA: Diagnosis not present

## 2018-01-09 DIAGNOSIS — R17 Unspecified jaundice: Secondary | ICD-10-CM | POA: Diagnosis not present

## 2018-01-09 DIAGNOSIS — N2581 Secondary hyperparathyroidism of renal origin: Secondary | ICD-10-CM | POA: Diagnosis not present

## 2018-01-09 DIAGNOSIS — Z79899 Other long term (current) drug therapy: Secondary | ICD-10-CM | POA: Diagnosis not present

## 2018-01-09 DIAGNOSIS — K7689 Other specified diseases of liver: Secondary | ICD-10-CM | POA: Diagnosis not present

## 2018-01-09 DIAGNOSIS — N186 End stage renal disease: Secondary | ICD-10-CM | POA: Diagnosis not present

## 2018-01-09 DIAGNOSIS — D631 Anemia in chronic kidney disease: Secondary | ICD-10-CM | POA: Diagnosis not present

## 2018-01-09 DIAGNOSIS — E8779 Other fluid overload: Secondary | ICD-10-CM | POA: Diagnosis not present

## 2018-01-09 DIAGNOSIS — Z992 Dependence on renal dialysis: Secondary | ICD-10-CM | POA: Diagnosis not present

## 2018-01-09 DIAGNOSIS — Z4931 Encounter for adequacy testing for hemodialysis: Secondary | ICD-10-CM | POA: Diagnosis not present

## 2018-01-09 DIAGNOSIS — B20 Human immunodeficiency virus [HIV] disease: Secondary | ICD-10-CM | POA: Diagnosis not present

## 2018-01-09 DIAGNOSIS — D509 Iron deficiency anemia, unspecified: Secondary | ICD-10-CM | POA: Diagnosis not present

## 2018-01-12 DIAGNOSIS — K7689 Other specified diseases of liver: Secondary | ICD-10-CM | POA: Diagnosis not present

## 2018-01-12 DIAGNOSIS — R17 Unspecified jaundice: Secondary | ICD-10-CM | POA: Diagnosis not present

## 2018-01-12 DIAGNOSIS — D509 Iron deficiency anemia, unspecified: Secondary | ICD-10-CM | POA: Diagnosis not present

## 2018-01-12 DIAGNOSIS — D631 Anemia in chronic kidney disease: Secondary | ICD-10-CM | POA: Diagnosis not present

## 2018-01-12 DIAGNOSIS — Z79899 Other long term (current) drug therapy: Secondary | ICD-10-CM | POA: Diagnosis not present

## 2018-01-12 DIAGNOSIS — N186 End stage renal disease: Secondary | ICD-10-CM | POA: Diagnosis not present

## 2018-01-14 DIAGNOSIS — D631 Anemia in chronic kidney disease: Secondary | ICD-10-CM | POA: Diagnosis not present

## 2018-01-14 DIAGNOSIS — Z79899 Other long term (current) drug therapy: Secondary | ICD-10-CM | POA: Diagnosis not present

## 2018-01-14 DIAGNOSIS — R17 Unspecified jaundice: Secondary | ICD-10-CM | POA: Diagnosis not present

## 2018-01-14 DIAGNOSIS — N186 End stage renal disease: Secondary | ICD-10-CM | POA: Diagnosis not present

## 2018-01-14 DIAGNOSIS — K7689 Other specified diseases of liver: Secondary | ICD-10-CM | POA: Diagnosis not present

## 2018-01-14 DIAGNOSIS — D509 Iron deficiency anemia, unspecified: Secondary | ICD-10-CM | POA: Diagnosis not present

## 2018-01-15 DIAGNOSIS — Z79899 Other long term (current) drug therapy: Secondary | ICD-10-CM | POA: Diagnosis not present

## 2018-01-15 DIAGNOSIS — K7689 Other specified diseases of liver: Secondary | ICD-10-CM | POA: Diagnosis not present

## 2018-01-15 DIAGNOSIS — D631 Anemia in chronic kidney disease: Secondary | ICD-10-CM | POA: Diagnosis not present

## 2018-01-15 DIAGNOSIS — N186 End stage renal disease: Secondary | ICD-10-CM | POA: Diagnosis not present

## 2018-01-15 DIAGNOSIS — D509 Iron deficiency anemia, unspecified: Secondary | ICD-10-CM | POA: Diagnosis not present

## 2018-01-15 DIAGNOSIS — R17 Unspecified jaundice: Secondary | ICD-10-CM | POA: Diagnosis not present

## 2018-01-16 DIAGNOSIS — Z79899 Other long term (current) drug therapy: Secondary | ICD-10-CM | POA: Diagnosis not present

## 2018-01-16 DIAGNOSIS — K7689 Other specified diseases of liver: Secondary | ICD-10-CM | POA: Diagnosis not present

## 2018-01-16 DIAGNOSIS — R17 Unspecified jaundice: Secondary | ICD-10-CM | POA: Diagnosis not present

## 2018-01-16 DIAGNOSIS — D509 Iron deficiency anemia, unspecified: Secondary | ICD-10-CM | POA: Diagnosis not present

## 2018-01-16 DIAGNOSIS — N186 End stage renal disease: Secondary | ICD-10-CM | POA: Diagnosis not present

## 2018-01-16 DIAGNOSIS — D631 Anemia in chronic kidney disease: Secondary | ICD-10-CM | POA: Diagnosis not present

## 2018-01-19 DIAGNOSIS — D509 Iron deficiency anemia, unspecified: Secondary | ICD-10-CM | POA: Diagnosis not present

## 2018-01-19 DIAGNOSIS — Z79899 Other long term (current) drug therapy: Secondary | ICD-10-CM | POA: Diagnosis not present

## 2018-01-19 DIAGNOSIS — R17 Unspecified jaundice: Secondary | ICD-10-CM | POA: Diagnosis not present

## 2018-01-19 DIAGNOSIS — K7689 Other specified diseases of liver: Secondary | ICD-10-CM | POA: Diagnosis not present

## 2018-01-19 DIAGNOSIS — N186 End stage renal disease: Secondary | ICD-10-CM | POA: Diagnosis not present

## 2018-01-19 DIAGNOSIS — D631 Anemia in chronic kidney disease: Secondary | ICD-10-CM | POA: Diagnosis not present

## 2018-01-21 DIAGNOSIS — D509 Iron deficiency anemia, unspecified: Secondary | ICD-10-CM | POA: Diagnosis not present

## 2018-01-21 DIAGNOSIS — D631 Anemia in chronic kidney disease: Secondary | ICD-10-CM | POA: Diagnosis not present

## 2018-01-21 DIAGNOSIS — Z79899 Other long term (current) drug therapy: Secondary | ICD-10-CM | POA: Diagnosis not present

## 2018-01-21 DIAGNOSIS — R17 Unspecified jaundice: Secondary | ICD-10-CM | POA: Diagnosis not present

## 2018-01-21 DIAGNOSIS — N186 End stage renal disease: Secondary | ICD-10-CM | POA: Diagnosis not present

## 2018-01-21 DIAGNOSIS — K7689 Other specified diseases of liver: Secondary | ICD-10-CM | POA: Diagnosis not present

## 2018-01-22 DIAGNOSIS — N186 End stage renal disease: Secondary | ICD-10-CM | POA: Diagnosis not present

## 2018-01-22 DIAGNOSIS — K7689 Other specified diseases of liver: Secondary | ICD-10-CM | POA: Diagnosis not present

## 2018-01-22 DIAGNOSIS — D631 Anemia in chronic kidney disease: Secondary | ICD-10-CM | POA: Diagnosis not present

## 2018-01-22 DIAGNOSIS — R17 Unspecified jaundice: Secondary | ICD-10-CM | POA: Diagnosis not present

## 2018-01-22 DIAGNOSIS — Z79899 Other long term (current) drug therapy: Secondary | ICD-10-CM | POA: Diagnosis not present

## 2018-01-22 DIAGNOSIS — D509 Iron deficiency anemia, unspecified: Secondary | ICD-10-CM | POA: Diagnosis not present

## 2018-01-23 DIAGNOSIS — R17 Unspecified jaundice: Secondary | ICD-10-CM | POA: Diagnosis not present

## 2018-01-23 DIAGNOSIS — Z79899 Other long term (current) drug therapy: Secondary | ICD-10-CM | POA: Diagnosis not present

## 2018-01-23 DIAGNOSIS — D631 Anemia in chronic kidney disease: Secondary | ICD-10-CM | POA: Diagnosis not present

## 2018-01-23 DIAGNOSIS — N186 End stage renal disease: Secondary | ICD-10-CM | POA: Diagnosis not present

## 2018-01-23 DIAGNOSIS — K7689 Other specified diseases of liver: Secondary | ICD-10-CM | POA: Diagnosis not present

## 2018-01-23 DIAGNOSIS — D509 Iron deficiency anemia, unspecified: Secondary | ICD-10-CM | POA: Diagnosis not present

## 2018-01-26 DIAGNOSIS — R17 Unspecified jaundice: Secondary | ICD-10-CM | POA: Diagnosis not present

## 2018-01-26 DIAGNOSIS — D509 Iron deficiency anemia, unspecified: Secondary | ICD-10-CM | POA: Diagnosis not present

## 2018-01-26 DIAGNOSIS — N186 End stage renal disease: Secondary | ICD-10-CM | POA: Diagnosis not present

## 2018-01-26 DIAGNOSIS — D631 Anemia in chronic kidney disease: Secondary | ICD-10-CM | POA: Diagnosis not present

## 2018-01-26 DIAGNOSIS — Z79899 Other long term (current) drug therapy: Secondary | ICD-10-CM | POA: Diagnosis not present

## 2018-01-26 DIAGNOSIS — K7689 Other specified diseases of liver: Secondary | ICD-10-CM | POA: Diagnosis not present

## 2018-01-27 DIAGNOSIS — Z79899 Other long term (current) drug therapy: Secondary | ICD-10-CM | POA: Diagnosis not present

## 2018-01-27 DIAGNOSIS — N186 End stage renal disease: Secondary | ICD-10-CM | POA: Diagnosis not present

## 2018-01-27 DIAGNOSIS — D509 Iron deficiency anemia, unspecified: Secondary | ICD-10-CM | POA: Diagnosis not present

## 2018-01-27 DIAGNOSIS — K7689 Other specified diseases of liver: Secondary | ICD-10-CM | POA: Diagnosis not present

## 2018-01-27 DIAGNOSIS — D631 Anemia in chronic kidney disease: Secondary | ICD-10-CM | POA: Diagnosis not present

## 2018-01-27 DIAGNOSIS — R17 Unspecified jaundice: Secondary | ICD-10-CM | POA: Diagnosis not present

## 2018-01-28 DIAGNOSIS — D631 Anemia in chronic kidney disease: Secondary | ICD-10-CM | POA: Diagnosis not present

## 2018-01-28 DIAGNOSIS — D509 Iron deficiency anemia, unspecified: Secondary | ICD-10-CM | POA: Diagnosis not present

## 2018-01-28 DIAGNOSIS — N186 End stage renal disease: Secondary | ICD-10-CM | POA: Diagnosis not present

## 2018-01-28 DIAGNOSIS — Z79899 Other long term (current) drug therapy: Secondary | ICD-10-CM | POA: Diagnosis not present

## 2018-01-28 DIAGNOSIS — R17 Unspecified jaundice: Secondary | ICD-10-CM | POA: Diagnosis not present

## 2018-01-28 DIAGNOSIS — K7689 Other specified diseases of liver: Secondary | ICD-10-CM | POA: Diagnosis not present

## 2018-01-29 DIAGNOSIS — N186 End stage renal disease: Secondary | ICD-10-CM | POA: Diagnosis not present

## 2018-01-29 DIAGNOSIS — D509 Iron deficiency anemia, unspecified: Secondary | ICD-10-CM | POA: Diagnosis not present

## 2018-01-29 DIAGNOSIS — Z79899 Other long term (current) drug therapy: Secondary | ICD-10-CM | POA: Diagnosis not present

## 2018-01-29 DIAGNOSIS — D631 Anemia in chronic kidney disease: Secondary | ICD-10-CM | POA: Diagnosis not present

## 2018-01-29 DIAGNOSIS — R17 Unspecified jaundice: Secondary | ICD-10-CM | POA: Diagnosis not present

## 2018-01-29 DIAGNOSIS — K7689 Other specified diseases of liver: Secondary | ICD-10-CM | POA: Diagnosis not present

## 2018-01-30 DIAGNOSIS — R17 Unspecified jaundice: Secondary | ICD-10-CM | POA: Diagnosis not present

## 2018-01-30 DIAGNOSIS — D631 Anemia in chronic kidney disease: Secondary | ICD-10-CM | POA: Diagnosis not present

## 2018-01-30 DIAGNOSIS — Z79899 Other long term (current) drug therapy: Secondary | ICD-10-CM | POA: Diagnosis not present

## 2018-01-30 DIAGNOSIS — N186 End stage renal disease: Secondary | ICD-10-CM | POA: Diagnosis not present

## 2018-01-30 DIAGNOSIS — D509 Iron deficiency anemia, unspecified: Secondary | ICD-10-CM | POA: Diagnosis not present

## 2018-01-30 DIAGNOSIS — K7689 Other specified diseases of liver: Secondary | ICD-10-CM | POA: Diagnosis not present

## 2018-02-02 DIAGNOSIS — Z79899 Other long term (current) drug therapy: Secondary | ICD-10-CM | POA: Diagnosis not present

## 2018-02-02 DIAGNOSIS — D631 Anemia in chronic kidney disease: Secondary | ICD-10-CM | POA: Diagnosis not present

## 2018-02-02 DIAGNOSIS — R17 Unspecified jaundice: Secondary | ICD-10-CM | POA: Diagnosis not present

## 2018-02-02 DIAGNOSIS — D509 Iron deficiency anemia, unspecified: Secondary | ICD-10-CM | POA: Diagnosis not present

## 2018-02-02 DIAGNOSIS — N186 End stage renal disease: Secondary | ICD-10-CM | POA: Diagnosis not present

## 2018-02-02 DIAGNOSIS — K7689 Other specified diseases of liver: Secondary | ICD-10-CM | POA: Diagnosis not present

## 2018-02-03 DIAGNOSIS — D631 Anemia in chronic kidney disease: Secondary | ICD-10-CM | POA: Diagnosis not present

## 2018-02-03 DIAGNOSIS — K7689 Other specified diseases of liver: Secondary | ICD-10-CM | POA: Diagnosis not present

## 2018-02-03 DIAGNOSIS — R17 Unspecified jaundice: Secondary | ICD-10-CM | POA: Diagnosis not present

## 2018-02-03 DIAGNOSIS — Z79899 Other long term (current) drug therapy: Secondary | ICD-10-CM | POA: Diagnosis not present

## 2018-02-03 DIAGNOSIS — N186 End stage renal disease: Secondary | ICD-10-CM | POA: Diagnosis not present

## 2018-02-03 DIAGNOSIS — D509 Iron deficiency anemia, unspecified: Secondary | ICD-10-CM | POA: Diagnosis not present

## 2018-02-04 DIAGNOSIS — K7689 Other specified diseases of liver: Secondary | ICD-10-CM | POA: Diagnosis not present

## 2018-02-04 DIAGNOSIS — Z79899 Other long term (current) drug therapy: Secondary | ICD-10-CM | POA: Diagnosis not present

## 2018-02-04 DIAGNOSIS — R17 Unspecified jaundice: Secondary | ICD-10-CM | POA: Diagnosis not present

## 2018-02-04 DIAGNOSIS — D631 Anemia in chronic kidney disease: Secondary | ICD-10-CM | POA: Diagnosis not present

## 2018-02-04 DIAGNOSIS — D509 Iron deficiency anemia, unspecified: Secondary | ICD-10-CM | POA: Diagnosis not present

## 2018-02-04 DIAGNOSIS — N186 End stage renal disease: Secondary | ICD-10-CM | POA: Diagnosis not present

## 2018-02-05 DIAGNOSIS — R17 Unspecified jaundice: Secondary | ICD-10-CM | POA: Diagnosis not present

## 2018-02-05 DIAGNOSIS — N186 End stage renal disease: Secondary | ICD-10-CM | POA: Diagnosis not present

## 2018-02-05 DIAGNOSIS — K7689 Other specified diseases of liver: Secondary | ICD-10-CM | POA: Diagnosis not present

## 2018-02-05 DIAGNOSIS — D631 Anemia in chronic kidney disease: Secondary | ICD-10-CM | POA: Diagnosis not present

## 2018-02-05 DIAGNOSIS — D509 Iron deficiency anemia, unspecified: Secondary | ICD-10-CM | POA: Diagnosis not present

## 2018-02-05 DIAGNOSIS — Z79899 Other long term (current) drug therapy: Secondary | ICD-10-CM | POA: Diagnosis not present

## 2018-02-06 DIAGNOSIS — K7689 Other specified diseases of liver: Secondary | ICD-10-CM | POA: Diagnosis not present

## 2018-02-06 DIAGNOSIS — N186 End stage renal disease: Secondary | ICD-10-CM | POA: Diagnosis not present

## 2018-02-06 DIAGNOSIS — D509 Iron deficiency anemia, unspecified: Secondary | ICD-10-CM | POA: Diagnosis not present

## 2018-02-06 DIAGNOSIS — D631 Anemia in chronic kidney disease: Secondary | ICD-10-CM | POA: Diagnosis not present

## 2018-02-06 DIAGNOSIS — Z79899 Other long term (current) drug therapy: Secondary | ICD-10-CM | POA: Diagnosis not present

## 2018-02-06 DIAGNOSIS — R17 Unspecified jaundice: Secondary | ICD-10-CM | POA: Diagnosis not present

## 2018-02-09 DIAGNOSIS — N2581 Secondary hyperparathyroidism of renal origin: Secondary | ICD-10-CM | POA: Diagnosis not present

## 2018-02-09 DIAGNOSIS — E8779 Other fluid overload: Secondary | ICD-10-CM | POA: Diagnosis not present

## 2018-02-09 DIAGNOSIS — B20 Human immunodeficiency virus [HIV] disease: Secondary | ICD-10-CM | POA: Diagnosis not present

## 2018-02-09 DIAGNOSIS — Z4932 Encounter for adequacy testing for peritoneal dialysis: Secondary | ICD-10-CM | POA: Diagnosis not present

## 2018-02-09 DIAGNOSIS — Z992 Dependence on renal dialysis: Secondary | ICD-10-CM | POA: Diagnosis not present

## 2018-02-09 DIAGNOSIS — D509 Iron deficiency anemia, unspecified: Secondary | ICD-10-CM | POA: Diagnosis not present

## 2018-02-09 DIAGNOSIS — N186 End stage renal disease: Secondary | ICD-10-CM | POA: Diagnosis not present

## 2018-02-09 DIAGNOSIS — Z4931 Encounter for adequacy testing for hemodialysis: Secondary | ICD-10-CM | POA: Diagnosis not present

## 2018-02-09 DIAGNOSIS — N2589 Other disorders resulting from impaired renal tubular function: Secondary | ICD-10-CM | POA: Diagnosis not present

## 2018-02-09 DIAGNOSIS — D631 Anemia in chronic kidney disease: Secondary | ICD-10-CM | POA: Diagnosis not present

## 2018-02-11 DIAGNOSIS — Z4931 Encounter for adequacy testing for hemodialysis: Secondary | ICD-10-CM | POA: Diagnosis not present

## 2018-02-11 DIAGNOSIS — N2581 Secondary hyperparathyroidism of renal origin: Secondary | ICD-10-CM | POA: Diagnosis not present

## 2018-02-11 DIAGNOSIS — N186 End stage renal disease: Secondary | ICD-10-CM | POA: Diagnosis not present

## 2018-02-11 DIAGNOSIS — D631 Anemia in chronic kidney disease: Secondary | ICD-10-CM | POA: Diagnosis not present

## 2018-02-11 DIAGNOSIS — Z4932 Encounter for adequacy testing for peritoneal dialysis: Secondary | ICD-10-CM | POA: Diagnosis not present

## 2018-02-11 DIAGNOSIS — D509 Iron deficiency anemia, unspecified: Secondary | ICD-10-CM | POA: Diagnosis not present

## 2018-02-12 ENCOUNTER — Emergency Department (HOSPITAL_BASED_OUTPATIENT_CLINIC_OR_DEPARTMENT_OTHER)
Admission: EM | Admit: 2018-02-12 | Discharge: 2018-02-12 | Disposition: A | Discharge status: Home / Self Care | Payer: Medicare Other | Attending: Physician Assistant | Admitting: Physician Assistant

## 2018-02-12 ENCOUNTER — Encounter (HOSPITAL_BASED_OUTPATIENT_CLINIC_OR_DEPARTMENT_OTHER): Payer: Self-pay

## 2018-02-12 DIAGNOSIS — Z992 Dependence on renal dialysis: Secondary | ICD-10-CM | POA: Insufficient documentation

## 2018-02-12 DIAGNOSIS — I120 Hypertensive chronic kidney disease with stage 5 chronic kidney disease or end stage renal disease: Secondary | ICD-10-CM | POA: Insufficient documentation

## 2018-02-12 DIAGNOSIS — R369 Urethral discharge, unspecified: Secondary | ICD-10-CM | POA: Diagnosis not present

## 2018-02-12 DIAGNOSIS — Z79899 Other long term (current) drug therapy: Secondary | ICD-10-CM | POA: Diagnosis not present

## 2018-02-12 DIAGNOSIS — Z202 Contact with and (suspected) exposure to infections with a predominantly sexual mode of transmission: Secondary | ICD-10-CM

## 2018-02-12 DIAGNOSIS — N186 End stage renal disease: Secondary | ICD-10-CM | POA: Diagnosis not present

## 2018-02-12 DIAGNOSIS — N2581 Secondary hyperparathyroidism of renal origin: Secondary | ICD-10-CM | POA: Diagnosis not present

## 2018-02-12 DIAGNOSIS — Z4931 Encounter for adequacy testing for hemodialysis: Secondary | ICD-10-CM | POA: Diagnosis not present

## 2018-02-12 DIAGNOSIS — Z4932 Encounter for adequacy testing for peritoneal dialysis: Secondary | ICD-10-CM | POA: Diagnosis not present

## 2018-02-12 DIAGNOSIS — D509 Iron deficiency anemia, unspecified: Secondary | ICD-10-CM | POA: Diagnosis not present

## 2018-02-12 DIAGNOSIS — D631 Anemia in chronic kidney disease: Secondary | ICD-10-CM | POA: Diagnosis not present

## 2018-02-12 LAB — CBG MONITORING, ED: Glucose-Capillary: 81 mg/dL (ref 65–99)

## 2018-02-12 NOTE — ED Notes (Signed)
[facility] 81. Diane, RN notified.

## 2018-02-12 NOTE — ED Triage Notes (Signed)
Pt c/o penis discharge since Monday; states on dialysis and condom broke so not sure if its urinary or STD

## 2018-02-12 NOTE — ED Provider Notes (Signed)
[facility] EMERGENCY DEPARTMENT Provider Note   CSN: [id number] Arrival date & time: 02/12/18  0800     History   Chief Complaint Chief Complaint  Patient presents with  . SEXUALLY TRANSMITTED DISEASE    HPI Dylan P Jearl Soto. is a 41 y.o. male.  HPI   42 year old HIV-positive male on dialysis presenting today with one episode of discharge from his penis.  Patient had penetrating anal sex on Friday and then on Sunday had a little bit of discharge.  Patient was concerned this could be an STD from his partner.  Patient has been in this partnership for 4 years.  Has not had any high risk behavior nor his partner.  Patient has no other symptoms.  Patient does not make urine.  Patient does report that his sugars have been elevated.  Past Medical History:  Diagnosis Date  . Anemia   . Dialysis patient ([place])   . Heart murmur   . HIV disease ([place])   . Hypertension   . Lymphocytosis 06/16/2014  . Noncompliance 03/06/2015  . Post herpetic neuralgia 11/25/2016   Left V1 distribution  . Renal disorder   . Seizure [facility])     Patient Active Problem List   Diagnosis Date Noted  . Post herpetic neuralgia 11/25/2016  . Zoster ophthalmicus 08/09/2015  . Screening examination for venereal disease 04/06/2015  . Noncompliance 03/06/2015  . Parvovirus B19 infection 08/10/2014  . 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 Human immunodeficiency virus (HIV) disease ([place]) 03/28/2014  . Pulmonary edema 03/27/2014  . Muscle twitching 12/14/2013  . Internal and external bleeding hemorrhoids 08/26/2013  . Hypertension   . ESRD (end stage renal disease) on dialysis ([place]) 05/04/2013  . Normocytic anemia 05/04/2013    Past Surgical History:  Procedure Laterality Date  . AV FISTULA PLACEMENT Left 05/07/2013   Procedure: ARTERIOVENOUS (AV) FISTULA CREATION- LEFT;  Surgeon: Rosetta Posner, MD;  Location: [place];  Service: Vascular;  Laterality: Left;  . AV FISTULA  PLACEMENT Left 08/12/2013   Procedure: ARTERIOVENOUS (AV) FISTULA CREATION BRACHIOCEPHALIC;  Surgeon: Angelia Mould, MD;  Location: [place];  Service: Vascular;  Laterality: Left;  . INSERTION OF DIALYSIS CATHETER     x 2  . LIGATION OF ARTERIOVENOUS  FISTULA Left 08/12/2013   Procedure: LIGATION OF ARTERIOVENOUS  FISTULA RADIOCEPHALIC;  Surgeon: Angelia Mould, MD;  Location: [place];  Service: Vascular;  Laterality: Left;        Home Medications    Prior to Admission medications   Medication Sig Start Date End Date Taking? Authorizing Provider  abacavir (ZIAGEN) 300 MG tablet TAKE 2 TABLETS BY MOUTH DAILY. STOP ZIDOVUDINE, STOP PREZCOBIX 05/12/17   Comer, Okey Regal, MD  carbamazepine (TEGRETOL) 200 MG tablet 1 tablet by mouth twice daily. Call to schedule follow-up to receive any future refills 09/15/17   Kathrynn Ducking, MD  carvedilol (COREG) 6.25 MG tablet Take 1 tablet (6.25 mg total) by mouth 2 (two) times daily. 02/07/15   Jettie Booze, MD  gabapentin (NEURONTIN) 100 MG capsule  10/29/16   [provider]  ibuprofen (ADVIL,MOTRIN) 200 MG tablet Take 800 mg by mouth every 6 (six) hours as needed for headache. Reported on 01/18/2016    [provider]  lamivudine (EPIVIR) 100 MG tablet Take 0.5 tablets (50 mg total) by mouth daily. 05/12/17   Thayer Headings, MD  predniSONE (DELTASONE) 50 MG tablet 1 tablet PO QD X4 days 03/08/17  Ripley Fraise, MD  rilpivirine (EDURANT) 25 MG TABS tablet TAKE 1 TABLET(25 MG) BY MOUTH DAILY WITH BREAKFAST 05/12/17   Thayer Headings, MD  TIVICAY 50 MG tablet TAKE 1 TABLET(50 MG) BY MOUTH DAILY 01/10/17   Comer, Okey Regal, MD  TIVICAY 50 MG tablet TAKE 1 TABLET(50 MG) BY MOUTH DAILY 05/12/17   Comer, Okey Regal, MD  valACYclovir (VALTREX) 500 MG tablet Take 500 mg by mouth daily. Reported on 01/18/2016    [provider]    Family History Family History  Problem Relation Age of Onset  . Kidney failure Mother   . Colon  polyps Father   . Kidney disease Paternal Uncle   . Kidney disease Maternal Grandfather   . Kidney failure Maternal Uncle     Social History Social History   Tobacco Use  . Smoking status: Never Smoker  . Smokeless tobacco: Never Used  Substance Use Topics  . Alcohol use: No  . Drug use: No     Allergies   Codeine; Eggs or egg-derived products; Heparin; Mercury; and Tomato   Review of Systems Review of Systems  Genitourinary: Positive for discharge.  All other systems reviewed and are negative.    Physical Exam Updated Vital Signs BP (!) 149/100 (BP Location: Right Arm)   Pulse 90   Temp 97.9 F (36.6 C) (Oral)   Resp 18   Ht 5\' 7"  (1.702 m)   Wt 95.3 kg (210 lb)   SpO2 96%   BMI 32.89 kg/m   Physical Exam  Constitutional: He is oriented to person, place, and time. He appears well-nourished.  HENT:  Head: Normocephalic.  Eyes: Conjunctivae are normal.  Cardiovascular: Normal rate.  Pulmonary/Chest: Effort normal and breath sounds normal. No respiratory distress.  Genitourinary: Penis normal. No penile tenderness.  Neurological: He is oriented to person, place, and time.  Skin: Skin is warm and dry. He is not diaphoretic.  Psychiatric: He has a normal mood and affect. His behavior is normal.     ED Treatments / Results  Labs (all labs ordered are listed, but only abnormal results are displayed) Labs Reviewed  RPR  GC/CHLAMYDIA PROBE AMP ([place]) NOT AT [facility]    EKG None  Radiology No results found.  Procedures Procedures (including critical care time)  Medications Ordered in ED Medications - No data to display   Initial Impression / Assessment and Plan / ED Course  I have reviewed the triage vital signs and the nursing notes.  Pertinent labs & imaging results that were available during my care of the patient were reviewed by me and considered in my medical decision making (see chart for details).     42 year old HIV-positive male  on dialysis presenting today with one episode of discharge from his penis.  Patient had penetrating anal sex on Friday and then on Sunday had a little bit of discharge.  Patient was concerned this could be an STD from his partner.  Patient has been in this partnership for 4 years.  Has not had any high risk behavior nor his partner.  Patient has no other symptoms.  Patient does not make urine.  Patient does report that his sugars have been elevated.  8:53 AM Patient had only one episode with a little bit of discharge.  And this was pretty close after having anal sex.  Think this would be too quickly for an STD to be manifest.  Patient's partner here as well.  We did a urethral probe.  Will send.  We will not treat presently.  Patient has close follow-up with HIV physician.  We will have him follow-up there.  We will send RPR given high rate of comorbidity.  Final Clinical Impressions(s) / ED Diagnoses   Final diagnoses:  None    ED Discharge Orders    None       Macarthur Critchley, MD 02/12/18 [phone]

## 2018-02-12 NOTE — Discharge Instructions (Addendum)
We want you to follow-up with your infectious disease doctor.  Your probe for chlamydia and gonorrhea will come back in 3-5 days.  Given your atypical symptoms, and low risk behavior, will not treat presumptively.  Please pay attention to symptoms.  If you have increase in penile discharge, any pain, or fever, return immediately to your infectious disease doctors.

## 2018-02-13 DIAGNOSIS — Z4931 Encounter for adequacy testing for hemodialysis: Secondary | ICD-10-CM | POA: Diagnosis not present

## 2018-02-13 DIAGNOSIS — D631 Anemia in chronic kidney disease: Secondary | ICD-10-CM | POA: Diagnosis not present

## 2018-02-13 DIAGNOSIS — Z4932 Encounter for adequacy testing for peritoneal dialysis: Secondary | ICD-10-CM | POA: Diagnosis not present

## 2018-02-13 DIAGNOSIS — N186 End stage renal disease: Secondary | ICD-10-CM | POA: Diagnosis not present

## 2018-02-13 DIAGNOSIS — N2581 Secondary hyperparathyroidism of renal origin: Secondary | ICD-10-CM | POA: Diagnosis not present

## 2018-02-13 DIAGNOSIS — D509 Iron deficiency anemia, unspecified: Secondary | ICD-10-CM | POA: Diagnosis not present

## 2018-02-13 LAB — RPR: RPR Ser Ql: NONREACTIVE

## 2018-02-13 LAB — GC/CHLAMYDIA PROBE AMP (~~LOC~~) NOT AT ARMC
CHLAMYDIA, DNA PROBE: POSITIVE — AB
NEISSERIA GONORRHEA: NEGATIVE

## 2018-02-16 DIAGNOSIS — Z4931 Encounter for adequacy testing for hemodialysis: Secondary | ICD-10-CM | POA: Diagnosis not present

## 2018-02-16 DIAGNOSIS — D631 Anemia in chronic kidney disease: Secondary | ICD-10-CM | POA: Diagnosis not present

## 2018-02-16 DIAGNOSIS — D509 Iron deficiency anemia, unspecified: Secondary | ICD-10-CM | POA: Diagnosis not present

## 2018-02-16 DIAGNOSIS — Z4932 Encounter for adequacy testing for peritoneal dialysis: Secondary | ICD-10-CM | POA: Diagnosis not present

## 2018-02-16 DIAGNOSIS — N2581 Secondary hyperparathyroidism of renal origin: Secondary | ICD-10-CM | POA: Diagnosis not present

## 2018-02-16 DIAGNOSIS — E7849 Other hyperlipidemia: Secondary | ICD-10-CM | POA: Diagnosis not present

## 2018-02-16 DIAGNOSIS — N186 End stage renal disease: Secondary | ICD-10-CM | POA: Diagnosis not present

## 2018-02-17 ENCOUNTER — Encounter (HOSPITAL_BASED_OUTPATIENT_CLINIC_OR_DEPARTMENT_OTHER): Payer: Self-pay | Admitting: Emergency Medicine

## 2018-02-17 ENCOUNTER — Other Ambulatory Visit: Payer: Self-pay

## 2018-02-17 ENCOUNTER — Emergency Department (HOSPITAL_BASED_OUTPATIENT_CLINIC_OR_DEPARTMENT_OTHER)
Admission: EM | Admit: 2018-02-17 | Discharge: 2018-02-17 | Disposition: A | Discharge status: Home / Self Care | Payer: Medicare Other | Attending: Emergency Medicine | Admitting: Emergency Medicine

## 2018-02-17 DIAGNOSIS — Z992 Dependence on renal dialysis: Secondary | ICD-10-CM | POA: Insufficient documentation

## 2018-02-17 DIAGNOSIS — Z202 Contact with and (suspected) exposure to infections with a predominantly sexual mode of transmission: Secondary | ICD-10-CM | POA: Diagnosis present

## 2018-02-17 DIAGNOSIS — I120 Hypertensive chronic kidney disease with stage 5 chronic kidney disease or end stage renal disease: Secondary | ICD-10-CM | POA: Insufficient documentation

## 2018-02-17 DIAGNOSIS — B20 Human immunodeficiency virus [HIV] disease: Secondary | ICD-10-CM | POA: Insufficient documentation

## 2018-02-17 DIAGNOSIS — Z79899 Other long term (current) drug therapy: Secondary | ICD-10-CM | POA: Diagnosis not present

## 2018-02-17 DIAGNOSIS — N186 End stage renal disease: Secondary | ICD-10-CM | POA: Insufficient documentation

## 2018-02-17 DIAGNOSIS — A749 Chlamydial infection, unspecified: Secondary | ICD-10-CM | POA: Diagnosis not present

## 2018-02-17 DIAGNOSIS — R300 Dysuria: Secondary | ICD-10-CM | POA: Diagnosis not present

## 2018-02-17 MED ORDER — DOXYCYCLINE HYCLATE 100 MG PO TABS
100.0000 mg | ORAL_TABLET | Freq: Once | ORAL | Status: AC
  Administered 2018-02-17: 100 mg via ORAL
  Filled 2018-02-17: qty 1

## 2018-02-17 MED ORDER — DOXYCYCLINE HYCLATE 100 MG PO CAPS: 100.0000 mg | ORAL_CAPSULE | Freq: Two times a day (BID) | ORAL | 0 refills | Status: DC

## 2018-02-17 MED FILL — DOXYCYCLINE HYCLATE 100 MG: 100 | 7 days supply | Qty: 14 | Fill #0

## 2018-02-17 NOTE — ED Provider Notes (Signed)
[facility] EMERGENCY DEPARTMENT Provider Note   CSN: [id number] Arrival date & time: 02/17/18  0827     History   Chief Complaint Chief Complaint  Patient presents with  . SEXUALLY TRANSMITTED DISEASE    HPI Dravon Nott. is a 42 y.o. male history of HIV, hypertension, ESRD on dialysis (last HD was Saturday), here presenting with possible STD.  Patient was seen here about 5 days ago after condom broke during anal intercourse.  He has some dysuria afterwards and RPR was checked and that was negative.  He had GC chlamydia probe done and chlamydia was positive.  He saw his result on my chart and came in get antibiotics.  He states that he really urinates since he is a dialysis patient.  However, when he does, he has some mild dysuria but denies any purulent discharge.  Denies any fevers or chills or abdominal pain. Denies any unprotected sex since the last ED visit.   The history is provided by the patient.    Past Medical History:  Diagnosis Date  . Anemia   . Dialysis patient ([place])   . Heart murmur   . HIV disease ([place])   . Hypertension   . Lymphocytosis 06/16/2014  . Noncompliance 03/06/2015  . Post herpetic neuralgia 11/25/2016   Left V1 distribution  . Renal disorder   . Seizure [facility])     Patient Active Problem List   Diagnosis Date Noted  . Post herpetic neuralgia 11/25/2016  . Zoster ophthalmicus 08/09/2015  . Screening examination for venereal disease 04/06/2015  . Noncompliance 03/06/2015  . Parvovirus B19 infection 08/10/2014  . 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 Human immunodeficiency virus (HIV) disease ([place]) 03/28/2014  . Pulmonary edema 03/27/2014  . Muscle twitching 12/14/2013  . Internal and external bleeding hemorrhoids 08/26/2013  . Hypertension   . ESRD (end stage renal disease) on dialysis ([place]) 05/04/2013  . Normocytic anemia 05/04/2013    Past Surgical History:  Procedure Laterality Date  . AV FISTULA  PLACEMENT Left 05/07/2013   Procedure: ARTERIOVENOUS (AV) FISTULA CREATION- LEFT;  Surgeon: Rosetta Posner, MD;  Location: [place];  Service: Vascular;  Laterality: Left;  . AV FISTULA PLACEMENT Left 08/12/2013   Procedure: ARTERIOVENOUS (AV) FISTULA CREATION BRACHIOCEPHALIC;  Surgeon: Angelia Mould, MD;  Location: [place];  Service: Vascular;  Laterality: Left;  . INSERTION OF DIALYSIS CATHETER     x 2  . LIGATION OF ARTERIOVENOUS  FISTULA Left 08/12/2013   Procedure: LIGATION OF ARTERIOVENOUS  FISTULA RADIOCEPHALIC;  Surgeon: Angelia Mould, MD;  Location: [place];  Service: Vascular;  Laterality: Left;        Home Medications    Prior to Admission medications   Medication Sig Start Date End Date Taking? Authorizing Provider  abacavir (ZIAGEN) 300 MG tablet TAKE 2 TABLETS BY MOUTH DAILY. STOP ZIDOVUDINE, STOP PREZCOBIX 05/12/17   Comer, Okey Regal, MD  carbamazepine (TEGRETOL) 200 MG tablet 1 tablet by mouth twice daily. Call to schedule follow-up to receive any future refills 09/15/17   Kathrynn Ducking, MD  carvedilol (COREG) 6.25 MG tablet Take 1 tablet (6.25 mg total) by mouth 2 (two) times daily. 02/07/15   Jettie Booze, MD  gabapentin (NEURONTIN) 100 MG capsule  10/29/16   [provider]  ibuprofen (ADVIL,MOTRIN) 200 MG tablet Take 800 mg by mouth every 6 (six) hours as needed for headache. Reported on 01/18/2016    [provider]  lamivudine (EPIVIR) 100 MG tablet Take 0.5 tablets (50 mg total) by mouth daily. 05/12/17   Thayer Headings, MD  predniSONE (DELTASONE) 50 MG tablet 1 tablet PO QD X4 days 03/08/17   Ripley Fraise, MD  rilpivirine (EDURANT) 25 MG TABS tablet TAKE 1 TABLET(25 MG) BY MOUTH DAILY WITH BREAKFAST 05/12/17   Thayer Headings, MD  TIVICAY 50 MG tablet TAKE 1 TABLET(50 MG) BY MOUTH DAILY 01/10/17   Comer, Okey Regal, MD  TIVICAY 50 MG tablet TAKE 1 TABLET(50 MG) BY MOUTH DAILY 05/12/17   Comer, Okey Regal, MD  valACYclovir (VALTREX) 500 MG tablet  Take 500 mg by mouth daily. Reported on 01/18/2016    [provider]    Family History Family History  Problem Relation Age of Onset  . Kidney failure Mother   . Colon polyps Father   . Kidney disease Paternal Uncle   . Kidney disease Maternal Grandfather   . Kidney failure Maternal Uncle     Social History Social History   Tobacco Use  . Smoking status: Never Smoker  . Smokeless tobacco: Never Used  Substance Use Topics  . Alcohol use: No  . Drug use: No     Allergies   Codeine; Eggs or egg-derived products; Heparin; Mercury; and Tomato   Review of Systems Review of Systems  Genitourinary: Positive for dysuria.  All other systems reviewed and are negative.    Physical Exam Updated Vital Signs BP (!) 142/92 (BP Location: Right Arm)   Pulse 99   Temp 98.3 F (36.8 C) (Oral)   Resp 18   Ht 5\' 7"  (1.702 m)   Wt 93 kg (205 lb)   SpO2 99%   BMI 32.11 kg/m   Physical Exam  Constitutional: He appears well-developed.  HENT:  Head: Normocephalic.  Eyes: Pupils are equal, round, and reactive to light.  Neck: Normal range of motion.  Cardiovascular: Normal rate.  Pulmonary/Chest: Effort normal.  Abdominal: Soft.  Genitourinary:  Genitourinary Comments: Deferred   Musculoskeletal: Normal range of motion.  Neurological: He is alert.  Skin: Skin is warm.  Psychiatric: He has a normal mood and affect.  Nursing note and vitals reviewed.    ED Treatments / Results  Labs (all labs ordered are listed, but only abnormal results are displayed) Labs Reviewed - No data to display  EKG None  Radiology No results found.  Procedures Procedures (including critical care time)  Medications Ordered in ED Medications  doxycycline (VIBRA-TABS) tablet 100 mg (has no administration in time range)     Initial Impression / Assessment and Plan / ED Course  I have reviewed the triage vital signs and the nursing notes.  Pertinent labs & imaging results that  were available during my care of the patient were reviewed by me and considered in my medical decision making (see chart for details).    Nilay P Shelia Magallon. is a 42 y.o. male here with + chlamydia after unprotected sex. Well appearing. He is dialysis patient and looked up dosing for azithromycin and doxycycline. Azithromycin is contraindicated and there is no dose adjustment for doxycycline so will prescribe doxycycline. Told him that his partner will need to be treated as well. Recommend safe sex practices    Final Clinical Impressions(s) / ED Diagnoses   Final diagnoses:  None    ED Discharge Orders    None       Drenda Freeze, MD 02/17/18 [phone]

## 2018-02-17 NOTE — Discharge Instructions (Signed)
Take doxycycline twice daily for a week. It doesn't need dosage adjustment for dialysis.   See your doctor.   Talk to your partner. Your partner needs treatment as well   Return to ER if you worse dysuria, purulent discharge from penis, fevers.

## 2018-02-17 NOTE — ED Triage Notes (Signed)
Pt here for treatment of std.  Seen on 02-12-2018

## 2018-02-18 DIAGNOSIS — D509 Iron deficiency anemia, unspecified: Secondary | ICD-10-CM | POA: Diagnosis not present

## 2018-02-18 DIAGNOSIS — D631 Anemia in chronic kidney disease: Secondary | ICD-10-CM | POA: Diagnosis not present

## 2018-02-18 DIAGNOSIS — Z4932 Encounter for adequacy testing for peritoneal dialysis: Secondary | ICD-10-CM | POA: Diagnosis not present

## 2018-02-18 DIAGNOSIS — N186 End stage renal disease: Secondary | ICD-10-CM | POA: Diagnosis not present

## 2018-02-18 DIAGNOSIS — Z4931 Encounter for adequacy testing for hemodialysis: Secondary | ICD-10-CM | POA: Diagnosis not present

## 2018-02-18 DIAGNOSIS — N2581 Secondary hyperparathyroidism of renal origin: Secondary | ICD-10-CM | POA: Diagnosis not present

## 2018-02-19 DIAGNOSIS — Z4932 Encounter for adequacy testing for peritoneal dialysis: Secondary | ICD-10-CM | POA: Diagnosis not present

## 2018-02-19 DIAGNOSIS — Z4931 Encounter for adequacy testing for hemodialysis: Secondary | ICD-10-CM | POA: Diagnosis not present

## 2018-02-19 DIAGNOSIS — D631 Anemia in chronic kidney disease: Secondary | ICD-10-CM | POA: Diagnosis not present

## 2018-02-19 DIAGNOSIS — D509 Iron deficiency anemia, unspecified: Secondary | ICD-10-CM | POA: Diagnosis not present

## 2018-02-19 DIAGNOSIS — N2581 Secondary hyperparathyroidism of renal origin: Secondary | ICD-10-CM | POA: Diagnosis not present

## 2018-02-19 DIAGNOSIS — N186 End stage renal disease: Secondary | ICD-10-CM | POA: Diagnosis not present

## 2018-02-20 DIAGNOSIS — Z4932 Encounter for adequacy testing for peritoneal dialysis: Secondary | ICD-10-CM | POA: Diagnosis not present

## 2018-02-20 DIAGNOSIS — N2581 Secondary hyperparathyroidism of renal origin: Secondary | ICD-10-CM | POA: Diagnosis not present

## 2018-02-20 DIAGNOSIS — N186 End stage renal disease: Secondary | ICD-10-CM | POA: Diagnosis not present

## 2018-02-20 DIAGNOSIS — D631 Anemia in chronic kidney disease: Secondary | ICD-10-CM | POA: Diagnosis not present

## 2018-02-20 DIAGNOSIS — D509 Iron deficiency anemia, unspecified: Secondary | ICD-10-CM | POA: Diagnosis not present

## 2018-02-20 DIAGNOSIS — Z4931 Encounter for adequacy testing for hemodialysis: Secondary | ICD-10-CM | POA: Diagnosis not present

## 2018-02-23 DIAGNOSIS — N186 End stage renal disease: Secondary | ICD-10-CM | POA: Diagnosis not present

## 2018-02-23 DIAGNOSIS — D631 Anemia in chronic kidney disease: Secondary | ICD-10-CM | POA: Diagnosis not present

## 2018-02-23 DIAGNOSIS — Z4932 Encounter for adequacy testing for peritoneal dialysis: Secondary | ICD-10-CM | POA: Diagnosis not present

## 2018-02-23 DIAGNOSIS — Z4931 Encounter for adequacy testing for hemodialysis: Secondary | ICD-10-CM | POA: Diagnosis not present

## 2018-02-23 DIAGNOSIS — D509 Iron deficiency anemia, unspecified: Secondary | ICD-10-CM | POA: Diagnosis not present

## 2018-02-23 DIAGNOSIS — N2581 Secondary hyperparathyroidism of renal origin: Secondary | ICD-10-CM | POA: Diagnosis not present

## 2018-02-25 DIAGNOSIS — Z4931 Encounter for adequacy testing for hemodialysis: Secondary | ICD-10-CM | POA: Diagnosis not present

## 2018-02-25 DIAGNOSIS — Z4932 Encounter for adequacy testing for peritoneal dialysis: Secondary | ICD-10-CM | POA: Diagnosis not present

## 2018-02-25 DIAGNOSIS — N2581 Secondary hyperparathyroidism of renal origin: Secondary | ICD-10-CM | POA: Diagnosis not present

## 2018-02-25 DIAGNOSIS — D509 Iron deficiency anemia, unspecified: Secondary | ICD-10-CM | POA: Diagnosis not present

## 2018-02-25 DIAGNOSIS — N186 End stage renal disease: Secondary | ICD-10-CM | POA: Diagnosis not present

## 2018-02-25 DIAGNOSIS — D631 Anemia in chronic kidney disease: Secondary | ICD-10-CM | POA: Diagnosis not present

## 2018-02-26 DIAGNOSIS — N186 End stage renal disease: Secondary | ICD-10-CM | POA: Diagnosis not present

## 2018-02-26 DIAGNOSIS — Z4931 Encounter for adequacy testing for hemodialysis: Secondary | ICD-10-CM | POA: Diagnosis not present

## 2018-02-26 DIAGNOSIS — Z4932 Encounter for adequacy testing for peritoneal dialysis: Secondary | ICD-10-CM | POA: Diagnosis not present

## 2018-02-26 DIAGNOSIS — D509 Iron deficiency anemia, unspecified: Secondary | ICD-10-CM | POA: Diagnosis not present

## 2018-02-26 DIAGNOSIS — N2581 Secondary hyperparathyroidism of renal origin: Secondary | ICD-10-CM | POA: Diagnosis not present

## 2018-02-26 DIAGNOSIS — D631 Anemia in chronic kidney disease: Secondary | ICD-10-CM | POA: Diagnosis not present

## 2018-02-27 DIAGNOSIS — D509 Iron deficiency anemia, unspecified: Secondary | ICD-10-CM | POA: Diagnosis not present

## 2018-02-27 DIAGNOSIS — Z4931 Encounter for adequacy testing for hemodialysis: Secondary | ICD-10-CM | POA: Diagnosis not present

## 2018-02-27 DIAGNOSIS — N2581 Secondary hyperparathyroidism of renal origin: Secondary | ICD-10-CM | POA: Diagnosis not present

## 2018-02-27 DIAGNOSIS — N186 End stage renal disease: Secondary | ICD-10-CM | POA: Diagnosis not present

## 2018-02-27 DIAGNOSIS — Z4932 Encounter for adequacy testing for peritoneal dialysis: Secondary | ICD-10-CM | POA: Diagnosis not present

## 2018-02-27 DIAGNOSIS — D631 Anemia in chronic kidney disease: Secondary | ICD-10-CM | POA: Diagnosis not present

## 2018-03-02 DIAGNOSIS — N2581 Secondary hyperparathyroidism of renal origin: Secondary | ICD-10-CM | POA: Diagnosis not present

## 2018-03-02 DIAGNOSIS — D509 Iron deficiency anemia, unspecified: Secondary | ICD-10-CM | POA: Diagnosis not present

## 2018-03-02 DIAGNOSIS — N186 End stage renal disease: Secondary | ICD-10-CM | POA: Diagnosis not present

## 2018-03-02 DIAGNOSIS — D631 Anemia in chronic kidney disease: Secondary | ICD-10-CM | POA: Diagnosis not present

## 2018-03-02 DIAGNOSIS — Z4931 Encounter for adequacy testing for hemodialysis: Secondary | ICD-10-CM | POA: Diagnosis not present

## 2018-03-02 DIAGNOSIS — Z4932 Encounter for adequacy testing for peritoneal dialysis: Secondary | ICD-10-CM | POA: Diagnosis not present

## 2018-03-04 DIAGNOSIS — N2581 Secondary hyperparathyroidism of renal origin: Secondary | ICD-10-CM | POA: Diagnosis not present

## 2018-03-04 DIAGNOSIS — N186 End stage renal disease: Secondary | ICD-10-CM | POA: Diagnosis not present

## 2018-03-04 DIAGNOSIS — Z4931 Encounter for adequacy testing for hemodialysis: Secondary | ICD-10-CM | POA: Diagnosis not present

## 2018-03-04 DIAGNOSIS — Z4932 Encounter for adequacy testing for peritoneal dialysis: Secondary | ICD-10-CM | POA: Diagnosis not present

## 2018-03-04 DIAGNOSIS — D509 Iron deficiency anemia, unspecified: Secondary | ICD-10-CM | POA: Diagnosis not present

## 2018-03-04 DIAGNOSIS — D631 Anemia in chronic kidney disease: Secondary | ICD-10-CM | POA: Diagnosis not present

## 2018-03-05 DIAGNOSIS — D631 Anemia in chronic kidney disease: Secondary | ICD-10-CM | POA: Diagnosis not present

## 2018-03-05 DIAGNOSIS — D509 Iron deficiency anemia, unspecified: Secondary | ICD-10-CM | POA: Diagnosis not present

## 2018-03-05 DIAGNOSIS — Z4931 Encounter for adequacy testing for hemodialysis: Secondary | ICD-10-CM | POA: Diagnosis not present

## 2018-03-05 DIAGNOSIS — N2581 Secondary hyperparathyroidism of renal origin: Secondary | ICD-10-CM | POA: Diagnosis not present

## 2018-03-05 DIAGNOSIS — Z4932 Encounter for adequacy testing for peritoneal dialysis: Secondary | ICD-10-CM | POA: Diagnosis not present

## 2018-03-05 DIAGNOSIS — N186 End stage renal disease: Secondary | ICD-10-CM | POA: Diagnosis not present

## 2018-03-06 DIAGNOSIS — Z4932 Encounter for adequacy testing for peritoneal dialysis: Secondary | ICD-10-CM | POA: Diagnosis not present

## 2018-03-06 DIAGNOSIS — D509 Iron deficiency anemia, unspecified: Secondary | ICD-10-CM | POA: Diagnosis not present

## 2018-03-06 DIAGNOSIS — N2581 Secondary hyperparathyroidism of renal origin: Secondary | ICD-10-CM | POA: Diagnosis not present

## 2018-03-06 DIAGNOSIS — D631 Anemia in chronic kidney disease: Secondary | ICD-10-CM | POA: Diagnosis not present

## 2018-03-06 DIAGNOSIS — Z4931 Encounter for adequacy testing for hemodialysis: Secondary | ICD-10-CM | POA: Diagnosis not present

## 2018-03-06 DIAGNOSIS — N186 End stage renal disease: Secondary | ICD-10-CM | POA: Diagnosis not present

## 2018-03-09 DIAGNOSIS — D631 Anemia in chronic kidney disease: Secondary | ICD-10-CM | POA: Diagnosis not present

## 2018-03-09 DIAGNOSIS — Z4931 Encounter for adequacy testing for hemodialysis: Secondary | ICD-10-CM | POA: Diagnosis not present

## 2018-03-09 DIAGNOSIS — N2581 Secondary hyperparathyroidism of renal origin: Secondary | ICD-10-CM | POA: Diagnosis not present

## 2018-03-09 DIAGNOSIS — D509 Iron deficiency anemia, unspecified: Secondary | ICD-10-CM | POA: Diagnosis not present

## 2018-03-09 DIAGNOSIS — Z4932 Encounter for adequacy testing for peritoneal dialysis: Secondary | ICD-10-CM | POA: Diagnosis not present

## 2018-03-09 DIAGNOSIS — N186 End stage renal disease: Secondary | ICD-10-CM | POA: Diagnosis not present

## 2018-03-11 DIAGNOSIS — N186 End stage renal disease: Secondary | ICD-10-CM | POA: Diagnosis not present

## 2018-03-11 DIAGNOSIS — Z992 Dependence on renal dialysis: Secondary | ICD-10-CM | POA: Diagnosis not present

## 2018-03-11 DIAGNOSIS — Z4932 Encounter for adequacy testing for peritoneal dialysis: Secondary | ICD-10-CM | POA: Diagnosis not present

## 2018-03-11 DIAGNOSIS — N2581 Secondary hyperparathyroidism of renal origin: Secondary | ICD-10-CM | POA: Diagnosis not present

## 2018-03-11 DIAGNOSIS — E8779 Other fluid overload: Secondary | ICD-10-CM | POA: Diagnosis not present

## 2018-03-11 DIAGNOSIS — B20 Human immunodeficiency virus [HIV] disease: Secondary | ICD-10-CM | POA: Diagnosis not present

## 2018-03-11 DIAGNOSIS — D509 Iron deficiency anemia, unspecified: Secondary | ICD-10-CM | POA: Diagnosis not present

## 2018-03-12 DIAGNOSIS — R82998 Other abnormal findings in urine: Secondary | ICD-10-CM | POA: Diagnosis not present

## 2018-03-12 DIAGNOSIS — N186 End stage renal disease: Secondary | ICD-10-CM | POA: Diagnosis not present

## 2018-03-12 DIAGNOSIS — Z4932 Encounter for adequacy testing for peritoneal dialysis: Secondary | ICD-10-CM | POA: Diagnosis not present

## 2018-03-12 DIAGNOSIS — N2581 Secondary hyperparathyroidism of renal origin: Secondary | ICD-10-CM | POA: Diagnosis not present

## 2018-03-12 DIAGNOSIS — D509 Iron deficiency anemia, unspecified: Secondary | ICD-10-CM | POA: Diagnosis not present

## 2018-03-12 DIAGNOSIS — E8779 Other fluid overload: Secondary | ICD-10-CM | POA: Diagnosis not present

## 2018-03-13 DIAGNOSIS — D509 Iron deficiency anemia, unspecified: Secondary | ICD-10-CM | POA: Diagnosis not present

## 2018-03-13 DIAGNOSIS — N186 End stage renal disease: Secondary | ICD-10-CM | POA: Diagnosis not present

## 2018-03-13 DIAGNOSIS — Z4932 Encounter for adequacy testing for peritoneal dialysis: Secondary | ICD-10-CM | POA: Diagnosis not present

## 2018-03-13 DIAGNOSIS — E8779 Other fluid overload: Secondary | ICD-10-CM | POA: Diagnosis not present

## 2018-03-13 DIAGNOSIS — N2581 Secondary hyperparathyroidism of renal origin: Secondary | ICD-10-CM | POA: Diagnosis not present

## 2018-03-16 DIAGNOSIS — D509 Iron deficiency anemia, unspecified: Secondary | ICD-10-CM | POA: Diagnosis not present

## 2018-03-16 DIAGNOSIS — N2581 Secondary hyperparathyroidism of renal origin: Secondary | ICD-10-CM | POA: Diagnosis not present

## 2018-03-16 DIAGNOSIS — Z4932 Encounter for adequacy testing for peritoneal dialysis: Secondary | ICD-10-CM | POA: Diagnosis not present

## 2018-03-16 DIAGNOSIS — E8779 Other fluid overload: Secondary | ICD-10-CM | POA: Diagnosis not present

## 2018-03-16 DIAGNOSIS — N186 End stage renal disease: Secondary | ICD-10-CM | POA: Diagnosis not present

## 2018-03-18 DIAGNOSIS — N2581 Secondary hyperparathyroidism of renal origin: Secondary | ICD-10-CM | POA: Diagnosis not present

## 2018-03-18 DIAGNOSIS — D509 Iron deficiency anemia, unspecified: Secondary | ICD-10-CM | POA: Diagnosis not present

## 2018-03-18 DIAGNOSIS — N186 End stage renal disease: Secondary | ICD-10-CM | POA: Diagnosis not present

## 2018-03-18 DIAGNOSIS — E8779 Other fluid overload: Secondary | ICD-10-CM | POA: Diagnosis not present

## 2018-03-18 DIAGNOSIS — Z4932 Encounter for adequacy testing for peritoneal dialysis: Secondary | ICD-10-CM | POA: Diagnosis not present

## 2018-03-19 ENCOUNTER — Other Ambulatory Visit: Payer: Self-pay | Admitting: Internal Medicine

## 2018-03-19 DIAGNOSIS — E8779 Other fluid overload: Secondary | ICD-10-CM | POA: Diagnosis not present

## 2018-03-19 DIAGNOSIS — D509 Iron deficiency anemia, unspecified: Secondary | ICD-10-CM | POA: Diagnosis not present

## 2018-03-19 DIAGNOSIS — N2581 Secondary hyperparathyroidism of renal origin: Secondary | ICD-10-CM | POA: Diagnosis not present

## 2018-03-19 DIAGNOSIS — Z4932 Encounter for adequacy testing for peritoneal dialysis: Secondary | ICD-10-CM | POA: Diagnosis not present

## 2018-03-19 DIAGNOSIS — N186 End stage renal disease: Secondary | ICD-10-CM | POA: Diagnosis not present

## 2018-03-20 DIAGNOSIS — N2581 Secondary hyperparathyroidism of renal origin: Secondary | ICD-10-CM | POA: Diagnosis not present

## 2018-03-20 DIAGNOSIS — N186 End stage renal disease: Secondary | ICD-10-CM | POA: Diagnosis not present

## 2018-03-20 DIAGNOSIS — E8779 Other fluid overload: Secondary | ICD-10-CM | POA: Diagnosis not present

## 2018-03-20 DIAGNOSIS — D509 Iron deficiency anemia, unspecified: Secondary | ICD-10-CM | POA: Diagnosis not present

## 2018-03-20 DIAGNOSIS — Z4932 Encounter for adequacy testing for peritoneal dialysis: Secondary | ICD-10-CM | POA: Diagnosis not present

## 2018-03-23 DIAGNOSIS — N186 End stage renal disease: Secondary | ICD-10-CM | POA: Diagnosis not present

## 2018-03-23 DIAGNOSIS — N2581 Secondary hyperparathyroidism of renal origin: Secondary | ICD-10-CM | POA: Diagnosis not present

## 2018-03-23 DIAGNOSIS — Z4932 Encounter for adequacy testing for peritoneal dialysis: Secondary | ICD-10-CM | POA: Diagnosis not present

## 2018-03-23 DIAGNOSIS — E8779 Other fluid overload: Secondary | ICD-10-CM | POA: Diagnosis not present

## 2018-03-23 DIAGNOSIS — D509 Iron deficiency anemia, unspecified: Secondary | ICD-10-CM | POA: Diagnosis not present

## 2018-03-25 DIAGNOSIS — E8779 Other fluid overload: Secondary | ICD-10-CM | POA: Diagnosis not present

## 2018-03-25 DIAGNOSIS — Z4932 Encounter for adequacy testing for peritoneal dialysis: Secondary | ICD-10-CM | POA: Diagnosis not present

## 2018-03-25 DIAGNOSIS — N186 End stage renal disease: Secondary | ICD-10-CM | POA: Diagnosis not present

## 2018-03-25 DIAGNOSIS — N2581 Secondary hyperparathyroidism of renal origin: Secondary | ICD-10-CM | POA: Diagnosis not present

## 2018-03-25 DIAGNOSIS — D509 Iron deficiency anemia, unspecified: Secondary | ICD-10-CM | POA: Diagnosis not present

## 2018-03-26 DIAGNOSIS — Z4932 Encounter for adequacy testing for peritoneal dialysis: Secondary | ICD-10-CM | POA: Diagnosis not present

## 2018-03-26 DIAGNOSIS — E8779 Other fluid overload: Secondary | ICD-10-CM | POA: Diagnosis not present

## 2018-03-26 DIAGNOSIS — N2581 Secondary hyperparathyroidism of renal origin: Secondary | ICD-10-CM | POA: Diagnosis not present

## 2018-03-26 DIAGNOSIS — D509 Iron deficiency anemia, unspecified: Secondary | ICD-10-CM | POA: Diagnosis not present

## 2018-03-26 DIAGNOSIS — N186 End stage renal disease: Secondary | ICD-10-CM | POA: Diagnosis not present

## 2018-03-27 DIAGNOSIS — N2581 Secondary hyperparathyroidism of renal origin: Secondary | ICD-10-CM | POA: Diagnosis not present

## 2018-03-27 DIAGNOSIS — N186 End stage renal disease: Secondary | ICD-10-CM | POA: Diagnosis not present

## 2018-03-27 DIAGNOSIS — E8779 Other fluid overload: Secondary | ICD-10-CM | POA: Diagnosis not present

## 2018-03-27 DIAGNOSIS — Z4932 Encounter for adequacy testing for peritoneal dialysis: Secondary | ICD-10-CM | POA: Diagnosis not present

## 2018-03-27 DIAGNOSIS — D509 Iron deficiency anemia, unspecified: Secondary | ICD-10-CM | POA: Diagnosis not present

## 2018-03-30 DIAGNOSIS — D509 Iron deficiency anemia, unspecified: Secondary | ICD-10-CM | POA: Diagnosis not present

## 2018-03-30 DIAGNOSIS — N2581 Secondary hyperparathyroidism of renal origin: Secondary | ICD-10-CM | POA: Diagnosis not present

## 2018-03-30 DIAGNOSIS — N186 End stage renal disease: Secondary | ICD-10-CM | POA: Diagnosis not present

## 2018-03-30 DIAGNOSIS — Z4932 Encounter for adequacy testing for peritoneal dialysis: Secondary | ICD-10-CM | POA: Diagnosis not present

## 2018-03-30 DIAGNOSIS — E8779 Other fluid overload: Secondary | ICD-10-CM | POA: Diagnosis not present

## 2018-04-01 DIAGNOSIS — D509 Iron deficiency anemia, unspecified: Secondary | ICD-10-CM | POA: Diagnosis not present

## 2018-04-01 DIAGNOSIS — N186 End stage renal disease: Secondary | ICD-10-CM | POA: Diagnosis not present

## 2018-04-01 DIAGNOSIS — N2581 Secondary hyperparathyroidism of renal origin: Secondary | ICD-10-CM | POA: Diagnosis not present

## 2018-04-01 DIAGNOSIS — E8779 Other fluid overload: Secondary | ICD-10-CM | POA: Diagnosis not present

## 2018-04-01 DIAGNOSIS — Z4932 Encounter for adequacy testing for peritoneal dialysis: Secondary | ICD-10-CM | POA: Diagnosis not present

## 2018-04-02 DIAGNOSIS — N2581 Secondary hyperparathyroidism of renal origin: Secondary | ICD-10-CM | POA: Diagnosis not present

## 2018-04-02 DIAGNOSIS — D509 Iron deficiency anemia, unspecified: Secondary | ICD-10-CM | POA: Diagnosis not present

## 2018-04-02 DIAGNOSIS — Z4932 Encounter for adequacy testing for peritoneal dialysis: Secondary | ICD-10-CM | POA: Diagnosis not present

## 2018-04-02 DIAGNOSIS — E8779 Other fluid overload: Secondary | ICD-10-CM | POA: Diagnosis not present

## 2018-04-02 DIAGNOSIS — N186 End stage renal disease: Secondary | ICD-10-CM | POA: Diagnosis not present

## 2018-04-03 DIAGNOSIS — N2581 Secondary hyperparathyroidism of renal origin: Secondary | ICD-10-CM | POA: Diagnosis not present

## 2018-04-03 DIAGNOSIS — N186 End stage renal disease: Secondary | ICD-10-CM | POA: Diagnosis not present

## 2018-04-03 DIAGNOSIS — D509 Iron deficiency anemia, unspecified: Secondary | ICD-10-CM | POA: Diagnosis not present

## 2018-04-03 DIAGNOSIS — Z4932 Encounter for adequacy testing for peritoneal dialysis: Secondary | ICD-10-CM | POA: Diagnosis not present

## 2018-04-03 DIAGNOSIS — E8779 Other fluid overload: Secondary | ICD-10-CM | POA: Diagnosis not present

## 2018-04-06 DIAGNOSIS — Z4932 Encounter for adequacy testing for peritoneal dialysis: Secondary | ICD-10-CM | POA: Diagnosis not present

## 2018-04-06 DIAGNOSIS — N186 End stage renal disease: Secondary | ICD-10-CM | POA: Diagnosis not present

## 2018-04-06 DIAGNOSIS — N2581 Secondary hyperparathyroidism of renal origin: Secondary | ICD-10-CM | POA: Diagnosis not present

## 2018-04-06 DIAGNOSIS — E8779 Other fluid overload: Secondary | ICD-10-CM | POA: Diagnosis not present

## 2018-04-06 DIAGNOSIS — D509 Iron deficiency anemia, unspecified: Secondary | ICD-10-CM | POA: Diagnosis not present

## 2018-04-08 DIAGNOSIS — E8779 Other fluid overload: Secondary | ICD-10-CM | POA: Diagnosis not present

## 2018-04-08 DIAGNOSIS — Z4932 Encounter for adequacy testing for peritoneal dialysis: Secondary | ICD-10-CM | POA: Diagnosis not present

## 2018-04-08 DIAGNOSIS — N2581 Secondary hyperparathyroidism of renal origin: Secondary | ICD-10-CM | POA: Diagnosis not present

## 2018-04-08 DIAGNOSIS — D509 Iron deficiency anemia, unspecified: Secondary | ICD-10-CM | POA: Diagnosis not present

## 2018-04-08 DIAGNOSIS — N186 End stage renal disease: Secondary | ICD-10-CM | POA: Diagnosis not present

## 2018-04-09 DIAGNOSIS — Z4932 Encounter for adequacy testing for peritoneal dialysis: Secondary | ICD-10-CM | POA: Diagnosis not present

## 2018-04-09 DIAGNOSIS — E8779 Other fluid overload: Secondary | ICD-10-CM | POA: Diagnosis not present

## 2018-04-09 DIAGNOSIS — N2581 Secondary hyperparathyroidism of renal origin: Secondary | ICD-10-CM | POA: Diagnosis not present

## 2018-04-09 DIAGNOSIS — D509 Iron deficiency anemia, unspecified: Secondary | ICD-10-CM | POA: Diagnosis not present

## 2018-04-09 DIAGNOSIS — N186 End stage renal disease: Secondary | ICD-10-CM | POA: Diagnosis not present

## 2018-04-11 DIAGNOSIS — Z885 Allergy status to narcotic agent status: Secondary | ICD-10-CM | POA: Diagnosis not present

## 2018-04-11 DIAGNOSIS — Z4822 Encounter for aftercare following kidney transplant: Secondary | ICD-10-CM | POA: Diagnosis not present

## 2018-04-11 DIAGNOSIS — Q613 Polycystic kidney, unspecified: Secondary | ICD-10-CM | POA: Diagnosis not present

## 2018-04-11 DIAGNOSIS — B20 Human immunodeficiency virus [HIV] disease: Secondary | ICD-10-CM | POA: Diagnosis present

## 2018-04-11 DIAGNOSIS — R918 Other nonspecific abnormal finding of lung field: Secondary | ICD-10-CM | POA: Diagnosis not present

## 2018-04-11 DIAGNOSIS — N29 Other disorders of kidney and ureter in diseases classified elsewhere: Secondary | ICD-10-CM | POA: Diagnosis present

## 2018-04-11 DIAGNOSIS — Z79899 Other long term (current) drug therapy: Secondary | ICD-10-CM | POA: Diagnosis not present

## 2018-04-11 DIAGNOSIS — Z01818 Encounter for other preprocedural examination: Secondary | ICD-10-CM | POA: Diagnosis not present

## 2018-04-11 DIAGNOSIS — Z21 Asymptomatic human immunodeficiency virus [HIV] infection status: Secondary | ICD-10-CM | POA: Diagnosis not present

## 2018-04-11 DIAGNOSIS — N289 Disorder of kidney and ureter, unspecified: Secondary | ICD-10-CM | POA: Diagnosis not present

## 2018-04-11 DIAGNOSIS — R9431 Abnormal electrocardiogram [ECG] [EKG]: Secondary | ICD-10-CM | POA: Diagnosis not present

## 2018-04-11 DIAGNOSIS — N2889 Other specified disorders of kidney and ureter: Secondary | ICD-10-CM | POA: Diagnosis not present

## 2018-04-11 DIAGNOSIS — R0989 Other specified symptoms and signs involving the circulatory and respiratory systems: Secondary | ICD-10-CM | POA: Diagnosis not present

## 2018-04-11 DIAGNOSIS — B9735 Human immunodeficiency virus, type 2 [HIV 2] as the cause of diseases classified elsewhere: Secondary | ICD-10-CM | POA: Diagnosis not present

## 2018-04-11 DIAGNOSIS — Z992 Dependence on renal dialysis: Secondary | ICD-10-CM | POA: Diagnosis not present

## 2018-04-11 DIAGNOSIS — D631 Anemia in chronic kidney disease: Secondary | ICD-10-CM | POA: Diagnosis present

## 2018-04-11 DIAGNOSIS — D62 Acute posthemorrhagic anemia: Secondary | ICD-10-CM | POA: Diagnosis not present

## 2018-04-11 DIAGNOSIS — I120 Hypertensive chronic kidney disease with stage 5 chronic kidney disease or end stage renal disease: Secondary | ICD-10-CM | POA: Diagnosis not present

## 2018-04-11 DIAGNOSIS — E875 Hyperkalemia: Secondary | ICD-10-CM | POA: Diagnosis not present

## 2018-04-11 DIAGNOSIS — Z940 Kidney transplant status: Secondary | ICD-10-CM | POA: Diagnosis not present

## 2018-04-11 DIAGNOSIS — N186 End stage renal disease: Secondary | ICD-10-CM | POA: Diagnosis present

## 2018-04-11 DIAGNOSIS — I10 Essential (primary) hypertension: Secondary | ICD-10-CM | POA: Diagnosis not present

## 2018-04-11 DIAGNOSIS — Z7982 Long term (current) use of aspirin: Secondary | ICD-10-CM | POA: Diagnosis not present

## 2018-04-11 DIAGNOSIS — Z792 Long term (current) use of antibiotics: Secondary | ICD-10-CM | POA: Diagnosis not present

## 2018-04-11 DIAGNOSIS — E871 Hypo-osmolality and hyponatremia: Secondary | ICD-10-CM | POA: Diagnosis not present

## 2018-04-11 DIAGNOSIS — N1330 Unspecified hydronephrosis: Secondary | ICD-10-CM | POA: Diagnosis not present

## 2018-04-11 DIAGNOSIS — I499 Cardiac arrhythmia, unspecified: Secondary | ICD-10-CM | POA: Diagnosis not present

## 2018-04-13 ENCOUNTER — Other Ambulatory Visit: Payer: Self-pay | Admitting: Neurology

## 2018-04-20 DIAGNOSIS — Z79899 Other long term (current) drug therapy: Secondary | ICD-10-CM | POA: Diagnosis not present

## 2018-04-20 DIAGNOSIS — Z4822 Encounter for aftercare following kidney transplant: Secondary | ICD-10-CM | POA: Diagnosis not present

## 2018-04-20 DIAGNOSIS — Z7952 Long term (current) use of systemic steroids: Secondary | ICD-10-CM | POA: Diagnosis not present

## 2018-04-20 DIAGNOSIS — I10 Essential (primary) hypertension: Secondary | ICD-10-CM | POA: Diagnosis not present

## 2018-04-20 DIAGNOSIS — B20 Human immunodeficiency virus [HIV] disease: Secondary | ICD-10-CM | POA: Diagnosis not present

## 2018-04-20 DIAGNOSIS — E785 Hyperlipidemia, unspecified: Secondary | ICD-10-CM | POA: Diagnosis not present

## 2018-04-20 DIAGNOSIS — Z792 Long term (current) use of antibiotics: Secondary | ICD-10-CM | POA: Diagnosis not present

## 2018-04-20 DIAGNOSIS — Z940 Kidney transplant status: Secondary | ICD-10-CM | POA: Diagnosis not present

## 2018-04-20 DIAGNOSIS — E872 Acidosis: Secondary | ICD-10-CM | POA: Diagnosis not present

## 2018-04-20 DIAGNOSIS — D649 Anemia, unspecified: Secondary | ICD-10-CM | POA: Diagnosis not present

## 2018-04-20 DIAGNOSIS — D8989 Other specified disorders involving the immune mechanism, not elsewhere classified: Secondary | ICD-10-CM | POA: Diagnosis not present

## 2018-04-24 DIAGNOSIS — E785 Hyperlipidemia, unspecified: Secondary | ICD-10-CM | POA: Diagnosis not present

## 2018-04-24 DIAGNOSIS — Z4822 Encounter for aftercare following kidney transplant: Secondary | ICD-10-CM | POA: Diagnosis not present

## 2018-04-24 DIAGNOSIS — I10 Essential (primary) hypertension: Secondary | ICD-10-CM | POA: Diagnosis not present

## 2018-04-24 DIAGNOSIS — B20 Human immunodeficiency virus [HIV] disease: Secondary | ICD-10-CM | POA: Diagnosis not present

## 2018-04-24 DIAGNOSIS — D8989 Other specified disorders involving the immune mechanism, not elsewhere classified: Secondary | ICD-10-CM | POA: Diagnosis not present

## 2018-04-24 DIAGNOSIS — Z79899 Other long term (current) drug therapy: Secondary | ICD-10-CM | POA: Diagnosis not present

## 2018-04-24 DIAGNOSIS — Z940 Kidney transplant status: Secondary | ICD-10-CM | POA: Diagnosis not present

## 2018-04-24 DIAGNOSIS — Z7952 Long term (current) use of systemic steroids: Secondary | ICD-10-CM | POA: Diagnosis not present

## 2018-04-24 DIAGNOSIS — E872 Acidosis: Secondary | ICD-10-CM | POA: Diagnosis not present

## 2018-04-24 DIAGNOSIS — D649 Anemia, unspecified: Secondary | ICD-10-CM | POA: Diagnosis not present

## 2018-04-24 DIAGNOSIS — Z792 Long term (current) use of antibiotics: Secondary | ICD-10-CM | POA: Diagnosis not present

## 2018-04-24 DIAGNOSIS — R197 Diarrhea, unspecified: Secondary | ICD-10-CM | POA: Diagnosis not present

## 2018-04-28 DIAGNOSIS — Z7983 Long term (current) use of bisphosphonates: Secondary | ICD-10-CM | POA: Diagnosis not present

## 2018-04-28 DIAGNOSIS — Z79899 Other long term (current) drug therapy: Secondary | ICD-10-CM | POA: Diagnosis not present

## 2018-04-28 DIAGNOSIS — Z940 Kidney transplant status: Secondary | ICD-10-CM | POA: Diagnosis not present

## 2018-04-28 DIAGNOSIS — Z7952 Long term (current) use of systemic steroids: Secondary | ICD-10-CM | POA: Diagnosis not present

## 2018-04-28 DIAGNOSIS — Z792 Long term (current) use of antibiotics: Secondary | ICD-10-CM | POA: Diagnosis not present

## 2018-04-28 DIAGNOSIS — I10 Essential (primary) hypertension: Secondary | ICD-10-CM | POA: Diagnosis not present

## 2018-04-28 DIAGNOSIS — R197 Diarrhea, unspecified: Secondary | ICD-10-CM | POA: Diagnosis not present

## 2018-04-28 DIAGNOSIS — D8989 Other specified disorders involving the immune mechanism, not elsewhere classified: Secondary | ICD-10-CM | POA: Diagnosis not present

## 2018-04-28 DIAGNOSIS — D649 Anemia, unspecified: Secondary | ICD-10-CM | POA: Diagnosis not present

## 2018-04-28 DIAGNOSIS — E785 Hyperlipidemia, unspecified: Secondary | ICD-10-CM | POA: Diagnosis not present

## 2018-04-28 DIAGNOSIS — B20 Human immunodeficiency virus [HIV] disease: Secondary | ICD-10-CM | POA: Diagnosis not present

## 2018-04-28 DIAGNOSIS — E872 Acidosis: Secondary | ICD-10-CM | POA: Diagnosis not present

## 2018-04-28 DIAGNOSIS — E871 Hypo-osmolality and hyponatremia: Secondary | ICD-10-CM | POA: Diagnosis not present

## 2018-04-28 DIAGNOSIS — Z4822 Encounter for aftercare following kidney transplant: Secondary | ICD-10-CM | POA: Diagnosis not present

## 2018-05-01 DIAGNOSIS — Z792 Long term (current) use of antibiotics: Secondary | ICD-10-CM | POA: Diagnosis not present

## 2018-05-01 DIAGNOSIS — D649 Anemia, unspecified: Secondary | ICD-10-CM | POA: Diagnosis not present

## 2018-05-01 DIAGNOSIS — Z5181 Encounter for therapeutic drug level monitoring: Secondary | ICD-10-CM | POA: Diagnosis not present

## 2018-05-01 DIAGNOSIS — Z79899 Other long term (current) drug therapy: Secondary | ICD-10-CM | POA: Diagnosis not present

## 2018-05-01 DIAGNOSIS — Z4802 Encounter for removal of sutures: Secondary | ICD-10-CM | POA: Diagnosis not present

## 2018-05-01 DIAGNOSIS — Z940 Kidney transplant status: Secondary | ICD-10-CM | POA: Diagnosis not present

## 2018-05-01 DIAGNOSIS — E872 Acidosis: Secondary | ICD-10-CM | POA: Diagnosis not present

## 2018-05-01 DIAGNOSIS — Z4822 Encounter for aftercare following kidney transplant: Secondary | ICD-10-CM | POA: Diagnosis not present

## 2018-05-01 DIAGNOSIS — E785 Hyperlipidemia, unspecified: Secondary | ICD-10-CM | POA: Diagnosis not present

## 2018-05-01 DIAGNOSIS — Z7952 Long term (current) use of systemic steroids: Secondary | ICD-10-CM | POA: Diagnosis not present

## 2018-05-01 DIAGNOSIS — R197 Diarrhea, unspecified: Secondary | ICD-10-CM | POA: Diagnosis not present

## 2018-05-01 DIAGNOSIS — B20 Human immunodeficiency virus [HIV] disease: Secondary | ICD-10-CM | POA: Diagnosis not present

## 2018-05-01 DIAGNOSIS — I10 Essential (primary) hypertension: Secondary | ICD-10-CM | POA: Diagnosis not present

## 2018-05-01 DIAGNOSIS — D899 Disorder involving the immune mechanism, unspecified: Secondary | ICD-10-CM | POA: Diagnosis not present

## 2018-05-04 DIAGNOSIS — R197 Diarrhea, unspecified: Secondary | ICD-10-CM | POA: Diagnosis not present

## 2018-05-04 DIAGNOSIS — B20 Human immunodeficiency virus [HIV] disease: Secondary | ICD-10-CM | POA: Diagnosis not present

## 2018-05-04 DIAGNOSIS — Z4822 Encounter for aftercare following kidney transplant: Secondary | ICD-10-CM | POA: Diagnosis not present

## 2018-05-04 DIAGNOSIS — Z7952 Long term (current) use of systemic steroids: Secondary | ICD-10-CM | POA: Diagnosis not present

## 2018-05-04 DIAGNOSIS — Z885 Allergy status to narcotic agent status: Secondary | ICD-10-CM | POA: Diagnosis not present

## 2018-05-04 DIAGNOSIS — Z940 Kidney transplant status: Secondary | ICD-10-CM | POA: Diagnosis not present

## 2018-05-04 DIAGNOSIS — Z960 Presence of urogenital implants: Secondary | ICD-10-CM | POA: Diagnosis not present

## 2018-05-04 DIAGNOSIS — D649 Anemia, unspecified: Secondary | ICD-10-CM | POA: Diagnosis not present

## 2018-05-04 DIAGNOSIS — Z466 Encounter for fitting and adjustment of urinary device: Secondary | ICD-10-CM | POA: Diagnosis not present

## 2018-05-04 DIAGNOSIS — D8989 Other specified disorders involving the immune mechanism, not elsewhere classified: Secondary | ICD-10-CM | POA: Diagnosis not present

## 2018-05-04 DIAGNOSIS — Z79899 Other long term (current) drug therapy: Secondary | ICD-10-CM | POA: Diagnosis not present

## 2018-05-04 DIAGNOSIS — E872 Acidosis: Secondary | ICD-10-CM | POA: Diagnosis not present

## 2018-05-04 DIAGNOSIS — Z888 Allergy status to other drugs, medicaments and biological substances status: Secondary | ICD-10-CM | POA: Diagnosis not present

## 2018-05-04 DIAGNOSIS — I10 Essential (primary) hypertension: Secondary | ICD-10-CM | POA: Diagnosis not present

## 2018-05-04 DIAGNOSIS — E785 Hyperlipidemia, unspecified: Secondary | ICD-10-CM | POA: Diagnosis not present

## 2018-05-04 DIAGNOSIS — R319 Hematuria, unspecified: Secondary | ICD-10-CM | POA: Diagnosis not present

## 2018-05-08 DIAGNOSIS — D649 Anemia, unspecified: Secondary | ICD-10-CM | POA: Diagnosis not present

## 2018-05-08 DIAGNOSIS — Z940 Kidney transplant status: Secondary | ICD-10-CM | POA: Diagnosis not present

## 2018-05-08 DIAGNOSIS — Z79899 Other long term (current) drug therapy: Secondary | ICD-10-CM | POA: Diagnosis not present

## 2018-05-08 DIAGNOSIS — Z792 Long term (current) use of antibiotics: Secondary | ICD-10-CM | POA: Diagnosis not present

## 2018-05-08 DIAGNOSIS — Z4822 Encounter for aftercare following kidney transplant: Secondary | ICD-10-CM | POA: Diagnosis not present

## 2018-05-08 DIAGNOSIS — Z885 Allergy status to narcotic agent status: Secondary | ICD-10-CM | POA: Diagnosis not present

## 2018-05-08 DIAGNOSIS — I10 Essential (primary) hypertension: Secondary | ICD-10-CM | POA: Diagnosis not present

## 2018-05-08 DIAGNOSIS — N186 End stage renal disease: Secondary | ICD-10-CM | POA: Diagnosis not present

## 2018-05-08 DIAGNOSIS — I120 Hypertensive chronic kidney disease with stage 5 chronic kidney disease or end stage renal disease: Secondary | ICD-10-CM | POA: Diagnosis not present

## 2018-05-08 DIAGNOSIS — Z992 Dependence on renal dialysis: Secondary | ICD-10-CM | POA: Diagnosis not present

## 2018-05-08 DIAGNOSIS — D8989 Other specified disorders involving the immune mechanism, not elsewhere classified: Secondary | ICD-10-CM | POA: Diagnosis not present

## 2018-05-08 DIAGNOSIS — E785 Hyperlipidemia, unspecified: Secondary | ICD-10-CM | POA: Diagnosis not present

## 2018-05-08 DIAGNOSIS — E872 Acidosis: Secondary | ICD-10-CM | POA: Diagnosis not present

## 2018-05-08 DIAGNOSIS — D631 Anemia in chronic kidney disease: Secondary | ICD-10-CM | POA: Diagnosis not present

## 2018-05-08 DIAGNOSIS — R252 Cramp and spasm: Secondary | ICD-10-CM | POA: Diagnosis not present

## 2018-05-08 DIAGNOSIS — B20 Human immunodeficiency virus [HIV] disease: Secondary | ICD-10-CM | POA: Diagnosis not present

## 2018-05-11 DIAGNOSIS — Z5181 Encounter for therapeutic drug level monitoring: Secondary | ICD-10-CM | POA: Diagnosis not present

## 2018-05-11 DIAGNOSIS — N08 Glomerular disorders in diseases classified elsewhere: Secondary | ICD-10-CM | POA: Diagnosis not present

## 2018-05-11 DIAGNOSIS — I10 Essential (primary) hypertension: Secondary | ICD-10-CM | POA: Diagnosis not present

## 2018-05-11 DIAGNOSIS — Z940 Kidney transplant status: Secondary | ICD-10-CM | POA: Diagnosis not present

## 2018-05-11 DIAGNOSIS — T8691 Unspecified transplanted organ and tissue rejection: Secondary | ICD-10-CM | POA: Diagnosis not present

## 2018-05-11 DIAGNOSIS — B20 Human immunodeficiency virus [HIV] disease: Secondary | ICD-10-CM | POA: Diagnosis not present

## 2018-05-11 DIAGNOSIS — N1339 Other hydronephrosis: Secondary | ICD-10-CM | POA: Diagnosis not present

## 2018-05-11 DIAGNOSIS — Z79899 Other long term (current) drug therapy: Secondary | ICD-10-CM | POA: Diagnosis not present

## 2018-05-11 DIAGNOSIS — N1330 Unspecified hydronephrosis: Secondary | ICD-10-CM | POA: Diagnosis not present

## 2018-05-11 DIAGNOSIS — N2889 Other specified disorders of kidney and ureter: Secondary | ICD-10-CM | POA: Diagnosis not present

## 2018-05-11 DIAGNOSIS — D899 Disorder involving the immune mechanism, unspecified: Secondary | ICD-10-CM | POA: Diagnosis not present

## 2018-05-11 DIAGNOSIS — Z4822 Encounter for aftercare following kidney transplant: Secondary | ICD-10-CM | POA: Diagnosis not present

## 2018-05-11 DIAGNOSIS — T8619 Other complication of kidney transplant: Secondary | ICD-10-CM | POA: Diagnosis not present

## 2018-05-15 DIAGNOSIS — Z940 Kidney transplant status: Secondary | ICD-10-CM | POA: Diagnosis not present

## 2018-05-15 DIAGNOSIS — M6281 Muscle weakness (generalized): Secondary | ICD-10-CM | POA: Diagnosis not present

## 2018-05-18 DIAGNOSIS — Z4822 Encounter for aftercare following kidney transplant: Secondary | ICD-10-CM | POA: Diagnosis not present

## 2018-05-18 DIAGNOSIS — Z792 Long term (current) use of antibiotics: Secondary | ICD-10-CM | POA: Diagnosis not present

## 2018-05-18 DIAGNOSIS — I10 Essential (primary) hypertension: Secondary | ICD-10-CM | POA: Diagnosis not present

## 2018-05-18 DIAGNOSIS — D649 Anemia, unspecified: Secondary | ICD-10-CM | POA: Diagnosis not present

## 2018-05-18 DIAGNOSIS — D8989 Other specified disorders involving the immune mechanism, not elsewhere classified: Secondary | ICD-10-CM | POA: Diagnosis not present

## 2018-05-18 DIAGNOSIS — B20 Human immunodeficiency virus [HIV] disease: Secondary | ICD-10-CM | POA: Diagnosis not present

## 2018-05-18 DIAGNOSIS — E785 Hyperlipidemia, unspecified: Secondary | ICD-10-CM | POA: Diagnosis not present

## 2018-05-18 DIAGNOSIS — E872 Acidosis: Secondary | ICD-10-CM | POA: Diagnosis not present

## 2018-05-18 DIAGNOSIS — Z940 Kidney transplant status: Secondary | ICD-10-CM | POA: Diagnosis not present

## 2018-05-18 DIAGNOSIS — Z79899 Other long term (current) drug therapy: Secondary | ICD-10-CM | POA: Diagnosis not present

## 2018-05-19 DIAGNOSIS — Z940 Kidney transplant status: Secondary | ICD-10-CM | POA: Diagnosis not present

## 2018-05-19 DIAGNOSIS — M6281 Muscle weakness (generalized): Secondary | ICD-10-CM | POA: Diagnosis not present

## 2018-05-22 DIAGNOSIS — M6281 Muscle weakness (generalized): Secondary | ICD-10-CM | POA: Diagnosis not present

## 2018-05-22 DIAGNOSIS — Z940 Kidney transplant status: Secondary | ICD-10-CM | POA: Diagnosis not present

## 2018-05-25 DIAGNOSIS — Z7952 Long term (current) use of systemic steroids: Secondary | ICD-10-CM | POA: Diagnosis not present

## 2018-05-25 DIAGNOSIS — Z79899 Other long term (current) drug therapy: Secondary | ICD-10-CM | POA: Diagnosis not present

## 2018-05-25 DIAGNOSIS — Z4822 Encounter for aftercare following kidney transplant: Secondary | ICD-10-CM | POA: Diagnosis not present

## 2018-05-25 DIAGNOSIS — Z792 Long term (current) use of antibiotics: Secondary | ICD-10-CM | POA: Diagnosis not present

## 2018-05-25 DIAGNOSIS — B20 Human immunodeficiency virus [HIV] disease: Secondary | ICD-10-CM | POA: Diagnosis not present

## 2018-05-25 DIAGNOSIS — Z7982 Long term (current) use of aspirin: Secondary | ICD-10-CM | POA: Diagnosis not present

## 2018-05-25 DIAGNOSIS — D8989 Other specified disorders involving the immune mechanism, not elsewhere classified: Secondary | ICD-10-CM | POA: Diagnosis not present

## 2018-05-25 DIAGNOSIS — Z940 Kidney transplant status: Secondary | ICD-10-CM | POA: Diagnosis not present

## 2018-05-25 DIAGNOSIS — Z960 Presence of urogenital implants: Secondary | ICD-10-CM | POA: Diagnosis not present

## 2018-05-25 DIAGNOSIS — I10 Essential (primary) hypertension: Secondary | ICD-10-CM | POA: Diagnosis not present

## 2018-05-25 DIAGNOSIS — Z885 Allergy status to narcotic agent status: Secondary | ICD-10-CM | POA: Diagnosis not present

## 2018-06-01 DIAGNOSIS — Z940 Kidney transplant status: Secondary | ICD-10-CM | POA: Diagnosis not present

## 2018-06-01 DIAGNOSIS — E872 Acidosis: Secondary | ICD-10-CM | POA: Diagnosis not present

## 2018-06-01 DIAGNOSIS — R319 Hematuria, unspecified: Secondary | ICD-10-CM | POA: Diagnosis not present

## 2018-06-01 DIAGNOSIS — I10 Essential (primary) hypertension: Secondary | ICD-10-CM | POA: Diagnosis not present

## 2018-06-01 DIAGNOSIS — Z4822 Encounter for aftercare following kidney transplant: Secondary | ICD-10-CM | POA: Diagnosis not present

## 2018-06-01 DIAGNOSIS — D649 Anemia, unspecified: Secondary | ICD-10-CM | POA: Diagnosis not present

## 2018-06-01 DIAGNOSIS — Z466 Encounter for fitting and adjustment of urinary device: Secondary | ICD-10-CM | POA: Diagnosis not present

## 2018-06-01 DIAGNOSIS — D8989 Other specified disorders involving the immune mechanism, not elsewhere classified: Secondary | ICD-10-CM | POA: Diagnosis not present

## 2018-06-01 DIAGNOSIS — Z885 Allergy status to narcotic agent status: Secondary | ICD-10-CM | POA: Diagnosis not present

## 2018-06-01 DIAGNOSIS — R197 Diarrhea, unspecified: Secondary | ICD-10-CM | POA: Diagnosis not present

## 2018-06-01 DIAGNOSIS — E871 Hypo-osmolality and hyponatremia: Secondary | ICD-10-CM | POA: Diagnosis not present

## 2018-06-01 DIAGNOSIS — T8619 Other complication of kidney transplant: Secondary | ICD-10-CM | POA: Diagnosis not present

## 2018-06-01 DIAGNOSIS — Z79899 Other long term (current) drug therapy: Secondary | ICD-10-CM | POA: Diagnosis not present

## 2018-06-01 DIAGNOSIS — E785 Hyperlipidemia, unspecified: Secondary | ICD-10-CM | POA: Diagnosis not present

## 2018-06-01 DIAGNOSIS — Z7952 Long term (current) use of systemic steroids: Secondary | ICD-10-CM | POA: Diagnosis not present

## 2018-06-01 DIAGNOSIS — B20 Human immunodeficiency virus [HIV] disease: Secondary | ICD-10-CM | POA: Diagnosis not present

## 2018-06-01 DIAGNOSIS — Z7983 Long term (current) use of bisphosphonates: Secondary | ICD-10-CM | POA: Diagnosis not present

## 2018-06-01 DIAGNOSIS — Z792 Long term (current) use of antibiotics: Secondary | ICD-10-CM | POA: Diagnosis not present

## 2018-06-01 DIAGNOSIS — T83123A Displacement of other urinary stents, initial encounter: Secondary | ICD-10-CM | POA: Diagnosis not present

## 2018-06-01 DIAGNOSIS — Z960 Presence of urogenital implants: Secondary | ICD-10-CM | POA: Diagnosis not present

## 2018-06-08 DIAGNOSIS — I10 Essential (primary) hypertension: Secondary | ICD-10-CM | POA: Diagnosis not present

## 2018-06-08 DIAGNOSIS — Z960 Presence of urogenital implants: Secondary | ICD-10-CM | POA: Diagnosis not present

## 2018-06-08 DIAGNOSIS — Z792 Long term (current) use of antibiotics: Secondary | ICD-10-CM | POA: Diagnosis not present

## 2018-06-08 DIAGNOSIS — D649 Anemia, unspecified: Secondary | ICD-10-CM | POA: Diagnosis not present

## 2018-06-08 DIAGNOSIS — E785 Hyperlipidemia, unspecified: Secondary | ICD-10-CM | POA: Diagnosis not present

## 2018-06-08 DIAGNOSIS — B20 Human immunodeficiency virus [HIV] disease: Secondary | ICD-10-CM | POA: Diagnosis not present

## 2018-06-08 DIAGNOSIS — Z940 Kidney transplant status: Secondary | ICD-10-CM | POA: Diagnosis not present

## 2018-06-08 DIAGNOSIS — Z79899 Other long term (current) drug therapy: Secondary | ICD-10-CM | POA: Diagnosis not present

## 2018-06-08 DIAGNOSIS — D8989 Other specified disorders involving the immune mechanism, not elsewhere classified: Secondary | ICD-10-CM | POA: Diagnosis not present

## 2018-06-08 DIAGNOSIS — Z4822 Encounter for aftercare following kidney transplant: Secondary | ICD-10-CM | POA: Diagnosis not present

## 2018-06-15 DIAGNOSIS — Z7982 Long term (current) use of aspirin: Secondary | ICD-10-CM | POA: Diagnosis not present

## 2018-06-15 DIAGNOSIS — Z79899 Other long term (current) drug therapy: Secondary | ICD-10-CM | POA: Diagnosis not present

## 2018-06-15 DIAGNOSIS — B20 Human immunodeficiency virus [HIV] disease: Secondary | ICD-10-CM | POA: Diagnosis not present

## 2018-06-15 DIAGNOSIS — Z940 Kidney transplant status: Secondary | ICD-10-CM | POA: Diagnosis not present

## 2018-06-15 DIAGNOSIS — Z01818 Encounter for other preprocedural examination: Secondary | ICD-10-CM | POA: Diagnosis not present

## 2018-06-15 DIAGNOSIS — Z87448 Personal history of other diseases of urinary system: Secondary | ICD-10-CM | POA: Diagnosis not present

## 2018-06-15 DIAGNOSIS — T83123A Displacement of other urinary stents, initial encounter: Secondary | ICD-10-CM | POA: Diagnosis not present

## 2018-06-17 DIAGNOSIS — N1330 Unspecified hydronephrosis: Secondary | ICD-10-CM | POA: Diagnosis not present

## 2018-06-17 DIAGNOSIS — T8619 Other complication of kidney transplant: Secondary | ICD-10-CM | POA: Diagnosis not present

## 2018-06-17 DIAGNOSIS — Z940 Kidney transplant status: Secondary | ICD-10-CM | POA: Diagnosis not present

## 2018-06-18 DIAGNOSIS — Z466 Encounter for fitting and adjustment of urinary device: Secondary | ICD-10-CM | POA: Diagnosis not present

## 2018-06-18 DIAGNOSIS — I120 Hypertensive chronic kidney disease with stage 5 chronic kidney disease or end stage renal disease: Secondary | ICD-10-CM | POA: Diagnosis not present

## 2018-06-18 DIAGNOSIS — T83123A Displacement of other urinary stents, initial encounter: Secondary | ICD-10-CM | POA: Diagnosis not present

## 2018-06-18 DIAGNOSIS — Z940 Kidney transplant status: Secondary | ICD-10-CM | POA: Diagnosis not present

## 2018-06-18 DIAGNOSIS — Z992 Dependence on renal dialysis: Secondary | ICD-10-CM | POA: Diagnosis not present

## 2018-06-18 DIAGNOSIS — N186 End stage renal disease: Secondary | ICD-10-CM | POA: Diagnosis not present

## 2018-06-18 DIAGNOSIS — B20 Human immunodeficiency virus [HIV] disease: Secondary | ICD-10-CM | POA: Diagnosis not present

## 2018-06-18 DIAGNOSIS — T85898A Other specified complication of other internal prosthetic devices, implants and grafts, initial encounter: Secondary | ICD-10-CM | POA: Diagnosis not present

## 2018-06-18 DIAGNOSIS — Z4822 Encounter for aftercare following kidney transplant: Secondary | ICD-10-CM | POA: Diagnosis not present

## 2018-06-19 DIAGNOSIS — Z940 Kidney transplant status: Secondary | ICD-10-CM | POA: Diagnosis not present

## 2018-06-19 DIAGNOSIS — Z992 Dependence on renal dialysis: Secondary | ICD-10-CM | POA: Diagnosis not present

## 2018-06-19 DIAGNOSIS — N186 End stage renal disease: Secondary | ICD-10-CM | POA: Diagnosis not present

## 2018-06-19 DIAGNOSIS — B20 Human immunodeficiency virus [HIV] disease: Secondary | ICD-10-CM | POA: Diagnosis not present

## 2018-06-19 DIAGNOSIS — T85898A Other specified complication of other internal prosthetic devices, implants and grafts, initial encounter: Secondary | ICD-10-CM | POA: Diagnosis not present

## 2018-06-19 DIAGNOSIS — I120 Hypertensive chronic kidney disease with stage 5 chronic kidney disease or end stage renal disease: Secondary | ICD-10-CM | POA: Diagnosis not present

## 2018-06-22 DIAGNOSIS — D6489 Other specified anemias: Secondary | ICD-10-CM | POA: Diagnosis not present

## 2018-06-22 DIAGNOSIS — D649 Anemia, unspecified: Secondary | ICD-10-CM | POA: Diagnosis not present

## 2018-06-22 DIAGNOSIS — Z4822 Encounter for aftercare following kidney transplant: Secondary | ICD-10-CM | POA: Diagnosis not present

## 2018-06-22 DIAGNOSIS — B20 Human immunodeficiency virus [HIV] disease: Secondary | ICD-10-CM | POA: Diagnosis not present

## 2018-06-22 DIAGNOSIS — I10 Essential (primary) hypertension: Secondary | ICD-10-CM | POA: Diagnosis not present

## 2018-06-22 DIAGNOSIS — Z792 Long term (current) use of antibiotics: Secondary | ICD-10-CM | POA: Diagnosis not present

## 2018-06-22 DIAGNOSIS — Z885 Allergy status to narcotic agent status: Secondary | ICD-10-CM | POA: Diagnosis not present

## 2018-06-22 DIAGNOSIS — Z960 Presence of urogenital implants: Secondary | ICD-10-CM | POA: Diagnosis not present

## 2018-06-22 DIAGNOSIS — D8989 Other specified disorders involving the immune mechanism, not elsewhere classified: Secondary | ICD-10-CM | POA: Diagnosis not present

## 2018-06-22 DIAGNOSIS — Z7952 Long term (current) use of systemic steroids: Secondary | ICD-10-CM | POA: Diagnosis not present

## 2018-06-22 DIAGNOSIS — E785 Hyperlipidemia, unspecified: Secondary | ICD-10-CM | POA: Diagnosis not present

## 2018-06-22 DIAGNOSIS — Z940 Kidney transplant status: Secondary | ICD-10-CM | POA: Diagnosis not present

## 2018-06-22 DIAGNOSIS — Z79899 Other long term (current) drug therapy: Secondary | ICD-10-CM | POA: Diagnosis not present

## 2018-06-30 DIAGNOSIS — Z8619 Personal history of other infectious and parasitic diseases: Secondary | ICD-10-CM | POA: Diagnosis not present

## 2018-06-30 DIAGNOSIS — Z79899 Other long term (current) drug therapy: Secondary | ICD-10-CM | POA: Diagnosis not present

## 2018-06-30 DIAGNOSIS — B20 Human immunodeficiency virus [HIV] disease: Secondary | ICD-10-CM | POA: Diagnosis not present

## 2018-06-30 DIAGNOSIS — L989 Disorder of the skin and subcutaneous tissue, unspecified: Secondary | ICD-10-CM | POA: Diagnosis not present

## 2018-06-30 DIAGNOSIS — Z940 Kidney transplant status: Secondary | ICD-10-CM | POA: Diagnosis not present

## 2018-06-30 DIAGNOSIS — Z792 Long term (current) use of antibiotics: Secondary | ICD-10-CM | POA: Diagnosis not present

## 2018-07-03 DIAGNOSIS — Z960 Presence of urogenital implants: Secondary | ICD-10-CM | POA: Diagnosis not present

## 2018-07-03 DIAGNOSIS — Z940 Kidney transplant status: Secondary | ICD-10-CM | POA: Diagnosis not present

## 2018-07-03 DIAGNOSIS — Z466 Encounter for fitting and adjustment of urinary device: Secondary | ICD-10-CM | POA: Diagnosis not present

## 2018-07-06 DIAGNOSIS — Z79899 Other long term (current) drug therapy: Secondary | ICD-10-CM | POA: Diagnosis not present

## 2018-07-06 DIAGNOSIS — Z792 Long term (current) use of antibiotics: Secondary | ICD-10-CM | POA: Diagnosis not present

## 2018-07-06 DIAGNOSIS — Z4822 Encounter for aftercare following kidney transplant: Secondary | ICD-10-CM | POA: Diagnosis not present

## 2018-07-06 DIAGNOSIS — T888XXA Other specified complications of surgical and medical care, not elsewhere classified, initial encounter: Secondary | ICD-10-CM | POA: Diagnosis not present

## 2018-07-06 DIAGNOSIS — Z960 Presence of urogenital implants: Secondary | ICD-10-CM | POA: Diagnosis not present

## 2018-07-06 DIAGNOSIS — E872 Acidosis: Secondary | ICD-10-CM | POA: Diagnosis not present

## 2018-07-06 DIAGNOSIS — D8989 Other specified disorders involving the immune mechanism, not elsewhere classified: Secondary | ICD-10-CM | POA: Diagnosis not present

## 2018-07-06 DIAGNOSIS — D649 Anemia, unspecified: Secondary | ICD-10-CM | POA: Diagnosis not present

## 2018-07-06 DIAGNOSIS — Z940 Kidney transplant status: Secondary | ICD-10-CM | POA: Diagnosis not present

## 2018-07-06 DIAGNOSIS — Z7983 Long term (current) use of bisphosphonates: Secondary | ICD-10-CM | POA: Diagnosis not present

## 2018-07-06 DIAGNOSIS — N08 Glomerular disorders in diseases classified elsewhere: Secondary | ICD-10-CM | POA: Diagnosis not present

## 2018-07-06 DIAGNOSIS — I10 Essential (primary) hypertension: Secondary | ICD-10-CM | POA: Diagnosis not present

## 2018-07-06 DIAGNOSIS — E869 Volume depletion, unspecified: Secondary | ICD-10-CM | POA: Diagnosis not present

## 2018-07-06 DIAGNOSIS — Z7952 Long term (current) use of systemic steroids: Secondary | ICD-10-CM | POA: Diagnosis not present

## 2018-07-06 DIAGNOSIS — Z5181 Encounter for therapeutic drug level monitoring: Secondary | ICD-10-CM | POA: Diagnosis not present

## 2018-07-06 DIAGNOSIS — B20 Human immunodeficiency virus [HIV] disease: Secondary | ICD-10-CM | POA: Diagnosis not present

## 2018-07-06 DIAGNOSIS — N521 Erectile dysfunction due to diseases classified elsewhere: Secondary | ICD-10-CM | POA: Diagnosis not present

## 2018-07-15 DIAGNOSIS — M6281 Muscle weakness (generalized): Secondary | ICD-10-CM | POA: Diagnosis not present

## 2018-07-15 DIAGNOSIS — Z940 Kidney transplant status: Secondary | ICD-10-CM | POA: Diagnosis not present

## 2018-07-17 DIAGNOSIS — Z940 Kidney transplant status: Secondary | ICD-10-CM | POA: Diagnosis not present

## 2018-07-17 DIAGNOSIS — M6281 Muscle weakness (generalized): Secondary | ICD-10-CM | POA: Diagnosis not present

## 2018-07-20 DIAGNOSIS — Z792 Long term (current) use of antibiotics: Secondary | ICD-10-CM | POA: Diagnosis not present

## 2018-07-20 DIAGNOSIS — E785 Hyperlipidemia, unspecified: Secondary | ICD-10-CM | POA: Diagnosis not present

## 2018-07-20 DIAGNOSIS — R944 Abnormal results of kidney function studies: Secondary | ICD-10-CM | POA: Diagnosis not present

## 2018-07-20 DIAGNOSIS — Z79899 Other long term (current) drug therapy: Secondary | ICD-10-CM | POA: Diagnosis not present

## 2018-07-20 DIAGNOSIS — B20 Human immunodeficiency virus [HIV] disease: Secondary | ICD-10-CM | POA: Diagnosis not present

## 2018-07-20 DIAGNOSIS — Z940 Kidney transplant status: Secondary | ICD-10-CM | POA: Diagnosis not present

## 2018-07-20 DIAGNOSIS — I10 Essential (primary) hypertension: Secondary | ICD-10-CM | POA: Diagnosis not present

## 2018-07-20 DIAGNOSIS — N1330 Unspecified hydronephrosis: Secondary | ICD-10-CM | POA: Diagnosis not present

## 2018-07-20 DIAGNOSIS — Z4822 Encounter for aftercare following kidney transplant: Secondary | ICD-10-CM | POA: Diagnosis not present

## 2018-07-20 DIAGNOSIS — D899 Disorder involving the immune mechanism, unspecified: Secondary | ICD-10-CM | POA: Diagnosis not present

## 2018-07-20 DIAGNOSIS — Z5181 Encounter for therapeutic drug level monitoring: Secondary | ICD-10-CM | POA: Diagnosis not present

## 2018-07-20 DIAGNOSIS — D649 Anemia, unspecified: Secondary | ICD-10-CM | POA: Diagnosis not present

## 2018-07-20 DIAGNOSIS — N529 Male erectile dysfunction, unspecified: Secondary | ICD-10-CM | POA: Diagnosis not present

## 2018-07-21 DIAGNOSIS — N1339 Other hydronephrosis: Secondary | ICD-10-CM | POA: Diagnosis not present

## 2018-07-21 DIAGNOSIS — Z940 Kidney transplant status: Secondary | ICD-10-CM | POA: Diagnosis not present

## 2018-07-30 DIAGNOSIS — D8989 Other specified disorders involving the immune mechanism, not elsewhere classified: Secondary | ICD-10-CM | POA: Diagnosis not present

## 2018-07-30 DIAGNOSIS — J00 Acute nasopharyngitis [common cold]: Secondary | ICD-10-CM | POA: Diagnosis not present

## 2018-07-30 DIAGNOSIS — B348 Other viral infections of unspecified site: Secondary | ICD-10-CM | POA: Diagnosis not present

## 2018-07-30 DIAGNOSIS — I10 Essential (primary) hypertension: Secondary | ICD-10-CM | POA: Diagnosis not present

## 2018-07-30 DIAGNOSIS — Z940 Kidney transplant status: Secondary | ICD-10-CM | POA: Diagnosis not present

## 2018-07-30 DIAGNOSIS — Z7982 Long term (current) use of aspirin: Secondary | ICD-10-CM | POA: Diagnosis not present

## 2018-07-30 DIAGNOSIS — Z792 Long term (current) use of antibiotics: Secondary | ICD-10-CM | POA: Diagnosis not present

## 2018-07-30 DIAGNOSIS — Z79899 Other long term (current) drug therapy: Secondary | ICD-10-CM | POA: Diagnosis not present

## 2018-07-30 DIAGNOSIS — B20 Human immunodeficiency virus [HIV] disease: Secondary | ICD-10-CM | POA: Diagnosis not present

## 2018-07-30 DIAGNOSIS — Z7952 Long term (current) use of systemic steroids: Secondary | ICD-10-CM | POA: Diagnosis not present

## 2018-07-30 DIAGNOSIS — Z4822 Encounter for aftercare following kidney transplant: Secondary | ICD-10-CM | POA: Diagnosis not present

## 2018-07-31 DIAGNOSIS — M6281 Muscle weakness (generalized): Secondary | ICD-10-CM | POA: Diagnosis not present

## 2018-07-31 DIAGNOSIS — Z940 Kidney transplant status: Secondary | ICD-10-CM | POA: Diagnosis not present

## 2018-08-05 DIAGNOSIS — Z940 Kidney transplant status: Secondary | ICD-10-CM | POA: Diagnosis not present

## 2018-08-05 DIAGNOSIS — M6281 Muscle weakness (generalized): Secondary | ICD-10-CM | POA: Diagnosis not present

## 2018-08-07 DIAGNOSIS — M6281 Muscle weakness (generalized): Secondary | ICD-10-CM | POA: Diagnosis not present

## 2018-08-07 DIAGNOSIS — Z940 Kidney transplant status: Secondary | ICD-10-CM | POA: Diagnosis not present

## 2018-08-13 DIAGNOSIS — M6281 Muscle weakness (generalized): Secondary | ICD-10-CM | POA: Diagnosis not present

## 2018-08-14 DIAGNOSIS — M6281 Muscle weakness (generalized): Secondary | ICD-10-CM | POA: Diagnosis not present

## 2018-08-17 DIAGNOSIS — N529 Male erectile dysfunction, unspecified: Secondary | ICD-10-CM | POA: Diagnosis not present

## 2018-08-17 DIAGNOSIS — D8989 Other specified disorders involving the immune mechanism, not elsewhere classified: Secondary | ICD-10-CM | POA: Diagnosis not present

## 2018-08-17 DIAGNOSIS — B20 Human immunodeficiency virus [HIV] disease: Secondary | ICD-10-CM | POA: Diagnosis not present

## 2018-08-17 DIAGNOSIS — Z79899 Other long term (current) drug therapy: Secondary | ICD-10-CM | POA: Diagnosis not present

## 2018-08-17 DIAGNOSIS — Z7952 Long term (current) use of systemic steroids: Secondary | ICD-10-CM | POA: Diagnosis not present

## 2018-08-17 DIAGNOSIS — I10 Essential (primary) hypertension: Secondary | ICD-10-CM | POA: Diagnosis not present

## 2018-08-17 DIAGNOSIS — E785 Hyperlipidemia, unspecified: Secondary | ICD-10-CM | POA: Diagnosis not present

## 2018-08-17 DIAGNOSIS — Z4822 Encounter for aftercare following kidney transplant: Secondary | ICD-10-CM | POA: Diagnosis not present

## 2018-08-17 DIAGNOSIS — Z940 Kidney transplant status: Secondary | ICD-10-CM | POA: Diagnosis not present

## 2018-08-17 DIAGNOSIS — D649 Anemia, unspecified: Secondary | ICD-10-CM | POA: Diagnosis not present

## 2018-08-17 DIAGNOSIS — Z792 Long term (current) use of antibiotics: Secondary | ICD-10-CM | POA: Diagnosis not present

## 2018-08-19 DIAGNOSIS — Z940 Kidney transplant status: Secondary | ICD-10-CM | POA: Diagnosis not present

## 2018-08-19 DIAGNOSIS — M6281 Muscle weakness (generalized): Secondary | ICD-10-CM | POA: Diagnosis not present

## 2018-08-25 DIAGNOSIS — Z79899 Other long term (current) drug therapy: Secondary | ICD-10-CM | POA: Diagnosis not present

## 2018-08-25 DIAGNOSIS — D649 Anemia, unspecified: Secondary | ICD-10-CM | POA: Diagnosis not present

## 2018-08-25 DIAGNOSIS — N529 Male erectile dysfunction, unspecified: Secondary | ICD-10-CM | POA: Diagnosis not present

## 2018-08-25 DIAGNOSIS — N2889 Other specified disorders of kidney and ureter: Secondary | ICD-10-CM | POA: Diagnosis not present

## 2018-08-25 DIAGNOSIS — I151 Hypertension secondary to other renal disorders: Secondary | ICD-10-CM | POA: Diagnosis not present

## 2018-08-25 DIAGNOSIS — Z940 Kidney transplant status: Secondary | ICD-10-CM | POA: Diagnosis not present

## 2018-08-25 DIAGNOSIS — B20 Human immunodeficiency virus [HIV] disease: Secondary | ICD-10-CM | POA: Diagnosis not present

## 2018-08-25 DIAGNOSIS — I10 Essential (primary) hypertension: Secondary | ICD-10-CM | POA: Diagnosis not present

## 2018-08-25 DIAGNOSIS — Z7982 Long term (current) use of aspirin: Secondary | ICD-10-CM | POA: Diagnosis not present

## 2018-08-25 DIAGNOSIS — Z21 Asymptomatic human immunodeficiency virus [HIV] infection status: Secondary | ICD-10-CM | POA: Diagnosis not present

## 2018-08-25 DIAGNOSIS — Z792 Long term (current) use of antibiotics: Secondary | ICD-10-CM | POA: Diagnosis not present

## 2018-08-25 DIAGNOSIS — Z5181 Encounter for therapeutic drug level monitoring: Secondary | ICD-10-CM | POA: Diagnosis not present

## 2018-08-25 DIAGNOSIS — N179 Acute kidney failure, unspecified: Secondary | ICD-10-CM | POA: Diagnosis not present

## 2018-08-25 DIAGNOSIS — R0981 Nasal congestion: Secondary | ICD-10-CM | POA: Diagnosis not present

## 2018-08-25 DIAGNOSIS — E785 Hyperlipidemia, unspecified: Secondary | ICD-10-CM | POA: Diagnosis not present

## 2018-08-27 DIAGNOSIS — Z940 Kidney transplant status: Secondary | ICD-10-CM | POA: Diagnosis not present

## 2018-08-27 DIAGNOSIS — T8619 Other complication of kidney transplant: Secondary | ICD-10-CM | POA: Diagnosis not present

## 2018-08-27 DIAGNOSIS — N179 Acute kidney failure, unspecified: Secondary | ICD-10-CM | POA: Diagnosis not present

## 2018-08-31 DIAGNOSIS — Z4822 Encounter for aftercare following kidney transplant: Secondary | ICD-10-CM | POA: Diagnosis not present

## 2018-08-31 DIAGNOSIS — Z7952 Long term (current) use of systemic steroids: Secondary | ICD-10-CM | POA: Diagnosis not present

## 2018-08-31 DIAGNOSIS — Z79899 Other long term (current) drug therapy: Secondary | ICD-10-CM | POA: Diagnosis not present

## 2018-08-31 DIAGNOSIS — Z792 Long term (current) use of antibiotics: Secondary | ICD-10-CM | POA: Diagnosis not present

## 2018-08-31 DIAGNOSIS — Z5181 Encounter for therapeutic drug level monitoring: Secondary | ICD-10-CM | POA: Diagnosis not present

## 2018-09-14 DIAGNOSIS — B20 Human immunodeficiency virus [HIV] disease: Secondary | ICD-10-CM | POA: Diagnosis not present

## 2018-09-14 DIAGNOSIS — N529 Male erectile dysfunction, unspecified: Secondary | ICD-10-CM | POA: Diagnosis not present

## 2018-09-14 DIAGNOSIS — Z79899 Other long term (current) drug therapy: Secondary | ICD-10-CM | POA: Diagnosis not present

## 2018-09-14 DIAGNOSIS — Z7952 Long term (current) use of systemic steroids: Secondary | ICD-10-CM | POA: Diagnosis not present

## 2018-09-14 DIAGNOSIS — E785 Hyperlipidemia, unspecified: Secondary | ICD-10-CM | POA: Diagnosis not present

## 2018-09-14 DIAGNOSIS — Z4822 Encounter for aftercare following kidney transplant: Secondary | ICD-10-CM | POA: Diagnosis not present

## 2018-09-14 DIAGNOSIS — Z885 Allergy status to narcotic agent status: Secondary | ICD-10-CM | POA: Diagnosis not present

## 2018-09-14 DIAGNOSIS — I10 Essential (primary) hypertension: Secondary | ICD-10-CM | POA: Diagnosis not present

## 2018-09-14 DIAGNOSIS — N289 Disorder of kidney and ureter, unspecified: Secondary | ICD-10-CM | POA: Diagnosis not present

## 2018-09-14 DIAGNOSIS — D649 Anemia, unspecified: Secondary | ICD-10-CM | POA: Diagnosis not present

## 2018-09-14 DIAGNOSIS — Z940 Kidney transplant status: Secondary | ICD-10-CM | POA: Diagnosis not present

## 2018-09-14 DIAGNOSIS — D8989 Other specified disorders involving the immune mechanism, not elsewhere classified: Secondary | ICD-10-CM | POA: Diagnosis not present

## 2018-09-28 DIAGNOSIS — Z79899 Other long term (current) drug therapy: Secondary | ICD-10-CM | POA: Diagnosis not present

## 2018-09-28 DIAGNOSIS — Z5181 Encounter for therapeutic drug level monitoring: Secondary | ICD-10-CM | POA: Diagnosis not present

## 2018-09-28 DIAGNOSIS — Z940 Kidney transplant status: Secondary | ICD-10-CM | POA: Diagnosis not present

## 2018-09-28 DIAGNOSIS — D899 Disorder involving the immune mechanism, unspecified: Secondary | ICD-10-CM | POA: Diagnosis not present

## 2018-10-05 ENCOUNTER — Emergency Department (HOSPITAL_BASED_OUTPATIENT_CLINIC_OR_DEPARTMENT_OTHER)
Admission: EM | Admit: 2018-10-05 | Discharge: 2018-10-05 | Disposition: A | Discharge status: Home / Self Care | Payer: Medicare Other | Attending: Emergency Medicine | Admitting: Emergency Medicine

## 2018-10-05 ENCOUNTER — Encounter (HOSPITAL_BASED_OUTPATIENT_CLINIC_OR_DEPARTMENT_OTHER): Payer: Self-pay | Admitting: Emergency Medicine

## 2018-10-05 ENCOUNTER — Other Ambulatory Visit: Payer: Self-pay

## 2018-10-05 DIAGNOSIS — R369 Urethral discharge, unspecified: Secondary | ICD-10-CM | POA: Insufficient documentation

## 2018-10-05 DIAGNOSIS — R300 Dysuria: Secondary | ICD-10-CM | POA: Diagnosis present

## 2018-10-05 DIAGNOSIS — B20 Human immunodeficiency virus [HIV] disease: Secondary | ICD-10-CM | POA: Diagnosis not present

## 2018-10-05 DIAGNOSIS — Z79899 Other long term (current) drug therapy: Secondary | ICD-10-CM | POA: Insufficient documentation

## 2018-10-05 DIAGNOSIS — I10 Essential (primary) hypertension: Secondary | ICD-10-CM | POA: Insufficient documentation

## 2018-10-05 LAB — URINALYSIS, ROUTINE W REFLEX MICROSCOPIC
Bilirubin Urine: NEGATIVE
GLUCOSE, UA: NEGATIVE mg/dL
Hgb urine dipstick: NEGATIVE
Ketones, ur: NEGATIVE mg/dL
LEUKOCYTES UA: NEGATIVE
Nitrite: NEGATIVE
PH: 5.5 (ref 5.0–8.0)
PROTEIN: NEGATIVE mg/dL
Specific Gravity, Urine: >1.03 — ABNORMAL HIGH (ref 1.005–1.030)

## 2018-10-05 MED ORDER — CEFTRIAXONE SODIUM 250 MG IJ SOLR
250.0000 mg | Freq: Once | INTRAMUSCULAR | Status: AC
  Administered 2018-10-05: 250 mg via INTRAMUSCULAR
  Filled 2018-10-05: qty 250

## 2018-10-05 MED ORDER — AZITHROMYCIN 250 MG PO TABS
1000.0000 mg | ORAL_TABLET | Freq: Once | ORAL | Status: AC
  Administered 2018-10-05: 1000 mg via ORAL
  Filled 2018-10-05: qty 4

## 2018-10-05 NOTE — ED Triage Notes (Addendum)
Reports told by sexual partner that he needed to be checked for STDs.  Reports discharge since Saturday, denies dysuria.

## 2018-10-05 NOTE — Discharge Instructions (Addendum)
Do not have sex for 2 weeks Have all partners tested and treated If your test is abnormal, you will be called but you have been treated for Gonorrhea and Chlamydia today. You can also review your results on MyChart Follow up with the Health Department

## 2018-10-05 NOTE — ED Provider Notes (Signed)
[place] [facility] EMERGENCY DEPARTMENT Provider Note   CSN: [id number] Arrival date & time: 10/05/18  1126     History   Chief Complaint Chief Complaint  Patient presents with  . Exposure to STD    HPI Dylan Barber. is a 42 y.o. male who presents with dysuria and penile discharge.  Past medical history significant for HIV, ESRD on dialysis.  The patient states that he had intercourse with his girlfriend 2 weeks ago.  About 4 days ago he started to have intermittent dysuria and clear penile discharge.  He denies fever, abdominal pain, vomiting, testicular pain.  He reports a history of STDs and tested positive for chlamydia last April.  HPI  Past Medical History:  Diagnosis Date  . Anemia   . Dialysis patient ([place])   . Heart murmur   . HIV disease ([place])   . Hypertension   . Lymphocytosis 06/16/2014  . Noncompliance 03/06/2015  . Post herpetic neuralgia 11/25/2016   Left V1 distribution  . Renal disorder   . Seizure [facility])     Patient Active Problem List   Diagnosis Date Noted  . Post herpetic neuralgia 11/25/2016  . Zoster ophthalmicus 08/09/2015  . Screening examination for venereal disease 04/06/2015  . Noncompliance 03/06/2015  . Parvovirus B19 infection 08/10/2014  . 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 Human immunodeficiency virus (HIV) disease ([place]) 03/28/2014  . Pulmonary edema 03/27/2014  . Muscle twitching 12/14/2013  . Internal and external bleeding hemorrhoids 08/26/2013  . Hypertension   . ESRD (end stage renal disease) on dialysis ([place]) 05/04/2013  . Normocytic anemia 05/04/2013    Past Surgical History:  Procedure Laterality Date  . AV FISTULA PLACEMENT Left 05/07/2013   Procedure: ARTERIOVENOUS (AV) FISTULA CREATION- LEFT;  Surgeon: Rosetta Posner, MD;  Location: [place];  Service: Vascular;  Laterality: Left;  . AV FISTULA PLACEMENT Left 08/12/2013   Procedure: ARTERIOVENOUS (AV) FISTULA CREATION BRACHIOCEPHALIC;   Surgeon: Angelia Mould, MD;  Location: [place];  Service: Vascular;  Laterality: Left;  . INSERTION OF DIALYSIS CATHETER     x 2  . LIGATION OF ARTERIOVENOUS  FISTULA Left 08/12/2013   Procedure: LIGATION OF ARTERIOVENOUS  FISTULA RADIOCEPHALIC;  Surgeon: Angelia Mould, MD;  Location: [place];  Service: Vascular;  Laterality: Left;        Home Medications    Prior to Admission medications   Medication Sig Start Date End Date Taking? Authorizing Provider  abacavir (ZIAGEN) 300 MG tablet TAKE 2 TABLETS BY MOUTH DAILY. STOP ZIDOVUDINE, STOP PREZCOBIX 05/12/17   Comer, Okey Regal, MD  carbamazepine (TEGRETOL) 200 MG tablet 1 tablet by mouth twice daily. Call to schedule follow-up to receive any future refills 09/15/17   Kathrynn Ducking, MD  carvedilol (COREG) 6.25 MG tablet Take 1 tablet (6.25 mg total) by mouth 2 (two) times daily. 02/07/15   Jettie Booze, MD  doxycycline (VIBRAMYCIN) 100 MG capsule Take 1 capsule (100 mg total) by mouth 2 (two) times daily. One po bid x 7 days 02/17/18   Drenda Freeze, MD  gabapentin (NEURONTIN) 100 MG capsule  10/29/16   [provider]  ibuprofen (ADVIL,MOTRIN) 200 MG tablet Take 800 mg by mouth every 6 (six) hours as needed for headache. Reported on 01/18/2016    [provider]  lamivudine (EPIVIR) 100 MG tablet Take 0.5 tablets (50 mg total) by mouth daily. 05/12/17   Comer, Okey Regal, MD  predniSONE (DELTASONE)  50 MG tablet 1 tablet PO QD X4 days 03/08/17   Ripley Fraise, MD  rilpivirine (EDURANT) 25 MG TABS tablet TAKE 1 TABLET(25 MG) BY MOUTH DAILY WITH BREAKFAST 05/12/17   Thayer Headings, MD  TIVICAY 50 MG tablet TAKE 1 TABLET(50 MG) BY MOUTH DAILY 01/10/17   Comer, Okey Regal, MD  TIVICAY 50 MG tablet TAKE 1 TABLET(50 MG) BY MOUTH DAILY 05/12/17   Comer, Okey Regal, MD  valACYclovir (VALTREX) 500 MG tablet Take 500 mg by mouth daily. Reported on 01/18/2016    [provider]    Family History Family History    Problem Relation Age of Onset  . Kidney failure Mother   . Colon polyps Father   . Kidney disease Paternal Uncle   . Kidney disease Maternal Grandfather   . Kidney failure Maternal Uncle     Social History Social History   Tobacco Use  . Smoking status: Never Smoker  . Smokeless tobacco: Never Used  Substance Use Topics  . Alcohol use: No  . Drug use: No     Allergies   Codeine; Eggs or egg-derived products; Heparin; Mercury; and Tomato   Review of Systems Review of Systems  Constitutional: Negative for fever.  Gastrointestinal: Negative for abdominal pain, nausea and vomiting.  Genitourinary: Positive for discharge and dysuria. Negative for difficulty urinating, penile pain and testicular pain.  All other systems reviewed and are negative.    Physical Exam Updated Vital Signs BP 121/78   Pulse 60   Temp 98 F (36.7 C) (Oral)   Resp 16   Ht 5\' 7"  (1.702 m)   Wt 81.6 kg   SpO2 97%   BMI 28.19 kg/m   Physical Exam  Constitutional: He is oriented to person, place, and time. He appears well-developed and well-nourished. No distress.  HENT:  Head: Normocephalic and atraumatic.  Eyes: Pupils are equal, round, and reactive to light. Conjunctivae are normal. Right eye exhibits no discharge. Left eye exhibits no discharge. No scleral icterus.  Neck: Normal range of motion.  Cardiovascular: Normal rate.  Pulmonary/Chest: Effort normal. No respiratory distress.  Abdominal: He exhibits no distension.  Genitourinary:  Genitourinary Comments: No inguinal lymphadenopathy or inguinal hernia noted. Normal circumcised penis free of lesions or rash. Testicles are nontender with normal lie. Normal scrotal appearance. No obvious discharge noted. Chaperone present during exam.   Neurological: He is alert and oriented to person, place, and time.  Skin: Skin is warm and dry.  Psychiatric: He has a normal mood and affect. His behavior is normal.  Nursing note and vitals  reviewed.    ED Treatments / Results  Labs (all labs ordered are listed, but only abnormal results are displayed) Labs Reviewed  URINALYSIS, ROUTINE W REFLEX MICROSCOPIC - Abnormal; Notable for the following components:      Result Value   Specific Gravity, Urine >1.030 (*)    All other components within normal limits    EKG None  Radiology No results found.  Procedures Procedures (including critical care time)  Medications Ordered in ED Medications  cefTRIAXone (ROCEPHIN) injection 250 mg (has no administration in time range)  azithromycin (ZITHROMAX) tablet 1,000 mg (has no administration in time range)     Initial Impression / Assessment and Plan / ED Course  I have reviewed the triage vital signs and the nursing notes.  Pertinent labs & imaging results that were available during my care of the patient were reviewed by me and considered in my medical decision  making (see chart for details).  42 year old male presents with penile discharge for the past 4 days.  He has a history of HIV and chlamydia.  States his symptoms are the same as to when he tested positive in April.  His exam is unremarkable.  His urine is clean.  He is already HIV positive so we will send syphilis, gonorrhea, chlamydia and empirically treat for G&C.   Final Clinical Impressions(s) / ED Diagnoses   Final diagnoses:  Penile discharge    ED Discharge Orders    None       Recardo Evangelist, PA-C 10/05/18 [place], Julie, MD 10/05/18 [phone]

## 2018-10-06 LAB — GC/CHLAMYDIA PROBE AMP (~~LOC~~) NOT AT ARMC
Chlamydia: NEGATIVE
NEISSERIA GONORRHEA: NEGATIVE

## 2018-10-07 LAB — RPR: RPR Ser Ql: NONREACTIVE

## 2018-10-12 DIAGNOSIS — N529 Male erectile dysfunction, unspecified: Secondary | ICD-10-CM | POA: Diagnosis not present

## 2018-10-12 DIAGNOSIS — Z7952 Long term (current) use of systemic steroids: Secondary | ICD-10-CM | POA: Diagnosis not present

## 2018-10-12 DIAGNOSIS — Z79899 Other long term (current) drug therapy: Secondary | ICD-10-CM | POA: Diagnosis not present

## 2018-10-12 DIAGNOSIS — B20 Human immunodeficiency virus [HIV] disease: Secondary | ICD-10-CM | POA: Diagnosis not present

## 2018-10-12 DIAGNOSIS — Z4822 Encounter for aftercare following kidney transplant: Secondary | ICD-10-CM | POA: Diagnosis not present

## 2018-10-12 DIAGNOSIS — Z792 Long term (current) use of antibiotics: Secondary | ICD-10-CM | POA: Diagnosis not present

## 2018-10-12 DIAGNOSIS — I10 Essential (primary) hypertension: Secondary | ICD-10-CM | POA: Diagnosis not present

## 2018-10-12 DIAGNOSIS — D649 Anemia, unspecified: Secondary | ICD-10-CM | POA: Diagnosis not present

## 2018-10-12 DIAGNOSIS — Z940 Kidney transplant status: Secondary | ICD-10-CM | POA: Diagnosis not present

## 2018-10-12 DIAGNOSIS — D8989 Other specified disorders involving the immune mechanism, not elsewhere classified: Secondary | ICD-10-CM | POA: Diagnosis not present

## 2018-10-12 DIAGNOSIS — E785 Hyperlipidemia, unspecified: Secondary | ICD-10-CM | POA: Diagnosis not present

## 2018-10-12 DIAGNOSIS — Z7982 Long term (current) use of aspirin: Secondary | ICD-10-CM | POA: Diagnosis not present

## 2018-11-09 DIAGNOSIS — Z4822 Encounter for aftercare following kidney transplant: Secondary | ICD-10-CM | POA: Diagnosis not present

## 2018-11-09 DIAGNOSIS — N528 Other male erectile dysfunction: Secondary | ICD-10-CM | POA: Diagnosis not present

## 2018-11-09 DIAGNOSIS — I10 Essential (primary) hypertension: Secondary | ICD-10-CM | POA: Diagnosis not present

## 2018-11-09 DIAGNOSIS — Z7952 Long term (current) use of systemic steroids: Secondary | ICD-10-CM | POA: Diagnosis not present

## 2018-11-09 DIAGNOSIS — E872 Acidosis: Secondary | ICD-10-CM | POA: Diagnosis not present

## 2018-11-09 DIAGNOSIS — E785 Hyperlipidemia, unspecified: Secondary | ICD-10-CM | POA: Diagnosis not present

## 2018-11-09 DIAGNOSIS — Z7982 Long term (current) use of aspirin: Secondary | ICD-10-CM | POA: Diagnosis not present

## 2018-11-09 DIAGNOSIS — Z23 Encounter for immunization: Secondary | ICD-10-CM | POA: Diagnosis not present

## 2018-11-09 DIAGNOSIS — B20 Human immunodeficiency virus [HIV] disease: Secondary | ICD-10-CM | POA: Diagnosis not present

## 2018-11-09 DIAGNOSIS — Z940 Kidney transplant status: Secondary | ICD-10-CM | POA: Diagnosis not present

## 2018-11-09 DIAGNOSIS — N2889 Other specified disorders of kidney and ureter: Secondary | ICD-10-CM | POA: Diagnosis not present

## 2018-11-09 DIAGNOSIS — I151 Hypertension secondary to other renal disorders: Secondary | ICD-10-CM | POA: Diagnosis not present

## 2018-11-09 DIAGNOSIS — N529 Male erectile dysfunction, unspecified: Secondary | ICD-10-CM | POA: Diagnosis not present

## 2018-11-09 DIAGNOSIS — Z792 Long term (current) use of antibiotics: Secondary | ICD-10-CM | POA: Diagnosis not present

## 2018-11-09 DIAGNOSIS — Z79899 Other long term (current) drug therapy: Secondary | ICD-10-CM | POA: Diagnosis not present

## 2018-11-09 DIAGNOSIS — Z9489 Other transplanted organ and tissue status: Secondary | ICD-10-CM | POA: Diagnosis not present

## 2019-04-12 ENCOUNTER — Emergency Department (HOSPITAL_BASED_OUTPATIENT_CLINIC_OR_DEPARTMENT_OTHER)
Admission: EM | Admit: 2019-04-12 | Discharge: 2019-04-12 | Disposition: A | Discharge status: Home / Self Care | Payer: Medicare Other | Attending: Emergency Medicine | Admitting: Emergency Medicine

## 2019-04-12 ENCOUNTER — Other Ambulatory Visit: Payer: Self-pay

## 2019-04-12 ENCOUNTER — Encounter (HOSPITAL_BASED_OUTPATIENT_CLINIC_OR_DEPARTMENT_OTHER): Payer: Self-pay | Admitting: *Deleted

## 2019-04-12 DIAGNOSIS — Z21 Asymptomatic human immunodeficiency virus [HIV] infection status: Secondary | ICD-10-CM | POA: Diagnosis not present

## 2019-04-12 DIAGNOSIS — N4833 Priapism, drug-induced: Secondary | ICD-10-CM | POA: Diagnosis present

## 2019-04-12 DIAGNOSIS — Z79899 Other long term (current) drug therapy: Secondary | ICD-10-CM | POA: Diagnosis not present

## 2019-04-12 DIAGNOSIS — Z940 Kidney transplant status: Secondary | ICD-10-CM | POA: Insufficient documentation

## 2019-04-12 DIAGNOSIS — I120 Hypertensive chronic kidney disease with stage 5 chronic kidney disease or end stage renal disease: Secondary | ICD-10-CM | POA: Diagnosis not present

## 2019-04-12 DIAGNOSIS — N186 End stage renal disease: Secondary | ICD-10-CM | POA: Diagnosis not present

## 2019-04-12 DIAGNOSIS — Z992 Dependence on renal dialysis: Secondary | ICD-10-CM | POA: Insufficient documentation

## 2019-04-12 MED ORDER — PHENYLEPHRINE 200 MCG/ML FOR PRIAPISM / HYPOTENSION
INTRAMUSCULAR | Status: AC
  Administered 2019-04-12: 50 ug via INTRACAVERNOUS
  Filled 2019-04-12: qty 50

## 2019-04-12 MED ORDER — PHENYLEPHRINE 200 MCG/ML FOR PRIAPISM / HYPOTENSION
50.0000 ug | Freq: Once | INTRAMUSCULAR | Status: AC
  Administered 2019-04-12: 50 ug via INTRACAVERNOUS

## 2019-04-12 MED ORDER — LIDOCAINE HCL (PF) 1 % IJ SOLN
5.0000 mL | Freq: Once | INTRAMUSCULAR | Status: AC
  Administered 2019-04-12: 5 mL
  Filled 2019-04-12: qty 5

## 2019-04-12 NOTE — ED Triage Notes (Signed)
He was injected with a new medication  this am and has a priapism that will not go away. ED as a result of kidney transplant.

## 2019-04-12 NOTE — ED Notes (Signed)
ED Provider at bedside. 

## 2019-04-12 NOTE — ED Provider Notes (Signed)
[facility] EMERGENCY DEPARTMENT Provider Note   CSN: [id number] Arrival date & time: 04/12/19  1431    History   Chief Complaint Chief Complaint  Patient presents with  . Priapism    HPI Fernando P Dontaye Hur. is a 43 y.o. male.     Patient is a 43 year old male with a history of HIV, hypertension, end-stage renal disease status post kidney transplant presenting today with an erection that is now been present for approximately 6 hours.  Patient states because of all of his prior medical issues he has suffered from erectile dysfunction.  He saw urologist today who injected him with phentolamine as a trial to see if the patient would be able to do that at home.  Today was the test day so that patient could then continue to do the medicine at home if at work.  However patient never detumesced.  He spoke with the office who recommended he get Sudafed and take 100 mg which he did 3 hours ago and still continues to have symptoms.  Patient then decided to come to the ER for further care.  He is having 7 out of 10 pain in his penis but has been able to urinate.  He has no abdominal pain or other complaints at this time.  The history is provided by the patient.    Past Medical History:  Diagnosis Date  . Anemia   . Dialysis patient ([place])   . Heart murmur   . HIV disease ([place])   . Hypertension   . Lymphocytosis 06/16/2014  . Noncompliance 03/06/2015  . Post herpetic neuralgia 11/25/2016   Left V1 distribution  . Renal disorder   . Seizure [facility])     Patient Active Problem List   Diagnosis Date Noted  . Post herpetic neuralgia 11/25/2016  . Zoster ophthalmicus 08/09/2015  . Screening examination for venereal disease 04/06/2015  . Noncompliance 03/06/2015  . Parvovirus B19 infection 08/10/2014  . 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 Human immunodeficiency virus (HIV) disease ([place]) 03/28/2014  . Pulmonary edema 03/27/2014  . Muscle twitching 12/14/2013   . Internal and external bleeding hemorrhoids 08/26/2013  . Hypertension   . ESRD (end stage renal disease) on dialysis ([place]) 05/04/2013  . Normocytic anemia 05/04/2013    Past Surgical History:  Procedure Laterality Date  . AV FISTULA PLACEMENT Left 05/07/2013   Procedure: ARTERIOVENOUS (AV) FISTULA CREATION- LEFT;  Surgeon: Rosetta Posner, MD;  Location: [place];  Service: Vascular;  Laterality: Left;  . AV FISTULA PLACEMENT Left 08/12/2013   Procedure: ARTERIOVENOUS (AV) FISTULA CREATION BRACHIOCEPHALIC;  Surgeon: Angelia Mould, MD;  Location: [place];  Service: Vascular;  Laterality: Left;  . INSERTION OF DIALYSIS CATHETER     x 2  . KIDNEY TRANSPLANT    . LIGATION OF ARTERIOVENOUS  FISTULA Left 08/12/2013   Procedure: LIGATION OF ARTERIOVENOUS  FISTULA RADIOCEPHALIC;  Surgeon: Angelia Mould, MD;  Location: [place];  Service: Vascular;  Laterality: Left;        Home Medications    Prior to Admission medications   Medication Sig Start Date End Date Taking? Authorizing Provider  abacavir (ZIAGEN) 300 MG tablet TAKE 2 TABLETS BY MOUTH DAILY. STOP ZIDOVUDINE, STOP PREZCOBIX 05/12/17  Yes Comer, Okey Regal, MD  abacavir (ZIAGEN) 300 MG tablet TAKE 2 TABLETS BY MOUTH DAILY 11/15/18  Yes [provider]  carvedilol (COREG) 6.25 MG tablet Take 1 tablet (6.25 mg total) by mouth 2 (two)  times daily. 02/07/15  Yes Jettie Booze, MD  gabapentin (NEURONTIN) 100 MG capsule  10/29/16  Yes [provider]  K Phos Mono-Sod Phos Di & Mono (K-PHOS-NEUTRAL) [phone] MG TABS Take by mouth. 07/30/18  Yes [provider]  lamivudine (EPIVIR) 100 MG tablet Take 0.5 tablets (50 mg total) by mouth daily. 05/12/17  Yes Comer, Okey Regal, MD  mycophenolate (MYFORTIC) 180 MG EC tablet Take by mouth. 03/01/19  Yes [provider]  predniSONE (DELTASONE) 50 MG tablet 1 tablet PO QD X4 days 03/08/17  Yes Ripley Fraise, MD  pseudoephedrine (SUDAFED) 60 MG tablet If you  experience an erection lasting more than 2 hours, you may take 1-2 pseudoephedrine 60 mg tablets by mouth once and apply an ice pack.If your erection does not go away within the next hour, go to ED immediatley. Erections that last more than 6 hours can cause serious damage to the penile tissue 03/17/19  Yes [provider]  rilpivirine (EDURANT) 25 MG TABS tablet TAKE 1 TABLET(25 MG) BY MOUTH DAILY WITH BREAKFAST 05/12/17  Yes Comer, Okey Regal, MD  sodium bicarbonate 650 MG tablet Take by mouth. 03/01/19  Yes [provider]  sulfamethoxazole-trimethoprim (BACTRIM) 400-80 MG tablet Take by mouth. 02/26/19  Yes [provider]  tacrolimus (PROGRAF) 1 MG capsule 5 mg in the am and 5 mg in the pm 03/11/19  Yes [provider]  TIVICAY 50 MG tablet TAKE 1 TABLET(50 MG) BY MOUTH DAILY 01/10/17  Yes Comer, Okey Regal, MD  valACYclovir (VALTREX) 500 MG tablet Take 500 mg by mouth daily. Reported on 01/18/2016   Yes [provider]  carbamazepine (TEGRETOL) 200 MG tablet 1 tablet by mouth twice daily. Call to schedule follow-up to receive any future refills 09/15/17   Kathrynn Ducking, MD  doxycycline (VIBRAMYCIN) 100 MG capsule Take 1 capsule (100 mg total) by mouth 2 (two) times daily. One po bid x 7 days 02/17/18   Drenda Freeze, MD  ibuprofen (ADVIL,MOTRIN) 200 MG tablet Take 800 mg by mouth every 6 (six) hours as needed for headache. Reported on 01/18/2016    [provider]  TIVICAY 50 MG tablet TAKE 1 TABLET(50 MG) BY MOUTH DAILY 05/12/17   Comer, Okey Regal, MD    Family History Family History  Problem Relation Age of Onset  . Kidney failure Mother   . Colon polyps Father   . Kidney disease Paternal Uncle   . Kidney disease Maternal Grandfather   . Kidney failure Maternal Uncle     Social History Social History   Tobacco Use  . Smoking status: Never Smoker  . Smokeless tobacco: Never Used  Substance Use Topics  . Alcohol use: No  . Drug use: No      Allergies   Codeine; Eggs or egg-derived products; Heparin; Mercury; and Tomato   Review of Systems Review of Systems  All other systems reviewed and are negative.    Physical Exam Updated Vital Signs BP (!) 152/111 (BP Location: Right Arm)   Pulse 89   Temp 98.3 F (36.8 C) (Oral)   Resp 16   Ht 5' 7.5" (1.715 m)   Wt 88.5 kg   SpO2 99%   BMI 30.09 kg/m   Physical Exam Vitals signs and nursing note reviewed.  Constitutional:      General: He is not in acute distress.    Appearance: Normal appearance. He is normal weight.  HENT:     Head: Normocephalic.  Cardiovascular:     Rate and Rhythm: Normal rate.  Pulmonary:     Effort: Pulmonary effort is normal.  Abdominal:     General: Abdomen is flat.     Palpations: Abdomen is soft.     Tenderness: There is no abdominal tenderness.  Genitourinary:    Penis: Circumcised.      Scrotum/Testes: Normal.     Comments: Penis is erect Musculoskeletal:     Right lower leg: No edema.     Left lower leg: No edema.  Skin:    General: Skin is warm.     Capillary Refill: Capillary refill takes less than 2 seconds.  Neurological:     General: No focal deficit present.     Mental Status: He is alert. Mental status is at baseline.  Psychiatric:        Mood and Affect: Mood normal.        Behavior: Behavior normal.        Thought Content: Thought content normal.      ED Treatments / Results  Labs (all labs ordered are listed, but only abnormal results are displayed) Labs Reviewed - No data to display  EKG None  Radiology No results found.  Procedures Irrigate corpus cavern, priapism Date/Time: 04/12/2019 4:06 PM Performed by: Blanchie Dessert, MD Authorized by: Blanchie Dessert, MD  Consent: Verbal consent obtained. Risks and benefits: risks, benefits and alternatives were discussed Consent given by: patient Patient understanding: patient states understanding of the procedure being performed Patient  consent: the patient's understanding of the procedure matches consent given Relevant documents: relevant documents present and verified Patient identity confirmed: verbally with patient Time out: Immediately prior to procedure a "time out" was called to verify the correct patient, procedure, equipment, support staff and site/side marked as required. Preparation: Patient was prepped and draped in the usual sterile fashion. Local anesthesia used: yes Anesthesia: local infiltration  Anesthesia: Local anesthesia used: yes Local Anesthetic: lidocaine 1% without epinephrine Anesthetic total: 1 mL  Sedation: Patient sedated: no  Patient tolerance: Patient tolerated the procedure well with no immediate complications Comments: Total of 56mL of blood aspirated with an 18g needle and total of 20mcg of phenylephrine injected with detumescence    (including critical care time)  Medications Ordered in ED Medications  phenylephrine 200 mcg / ml CONC. DILUTION INJ (ED / Urology USE ONLY) (50 mcg Intracavernosal Given 04/12/19 1536)  lidocaine (PF) (XYLOCAINE) 1 % injection 5 mL (5 mLs Infiltration Given 04/12/19 1539)     Initial Impression / Assessment and Plan / ED Course  I have reviewed the triage vital signs and the nursing notes.  Pertinent labs & imaging results that were available during my care of the patient were reviewed by me and considered in my medical decision making (see chart for details).        Patient presenting today with a priapism for the last 6 hours after getting injection of phentolamine by urology as a trial for a drug he could use at home for ED.  Patient did take Sudafed as instructed by the urologist which also did not help.  Here patient's priapism was resolved after aspiration of approximately 14 mL's of blood and injection of phentolamine 50 mcg x 1 and 25 mcg x 1.  Patient has now detumesced and is feeling better.  He is hypertensive here however that may be  related to taking a significant amount of Sudafed prior to arrival.  Patient will not use any more of  this medication and will talk with urology for other possibilities in the future.  Final Clinical Impressions(s) / ED Diagnoses   Final diagnoses:  Priapism, drug-induced    ED Discharge Orders    None       Blanchie Dessert, MD 04/12/19 1607

## 2019-04-12 NOTE — Discharge Instructions (Addendum)
Use ice packs when you get home if still not totally back to normal.  If returns then go to [facility] or [facility] for further care.

## 2019-09-13 ENCOUNTER — Encounter (HOSPITAL_BASED_OUTPATIENT_CLINIC_OR_DEPARTMENT_OTHER): Payer: Self-pay | Admitting: *Deleted

## 2019-09-13 ENCOUNTER — Other Ambulatory Visit: Payer: Self-pay

## 2019-09-13 ENCOUNTER — Emergency Department (HOSPITAL_BASED_OUTPATIENT_CLINIC_OR_DEPARTMENT_OTHER)
Admission: EM | Admit: 2019-09-13 | Discharge: 2019-09-13 | Disposition: A | Discharge status: Home / Self Care | Payer: Medicare Other | Attending: Emergency Medicine | Admitting: Emergency Medicine

## 2019-09-13 DIAGNOSIS — Z91012 Allergy to eggs: Secondary | ICD-10-CM | POA: Insufficient documentation

## 2019-09-13 DIAGNOSIS — B20 Human immunodeficiency virus [HIV] disease: Secondary | ICD-10-CM | POA: Insufficient documentation

## 2019-09-13 DIAGNOSIS — Z885 Allergy status to narcotic agent status: Secondary | ICD-10-CM | POA: Insufficient documentation

## 2019-09-13 DIAGNOSIS — Z888 Allergy status to other drugs, medicaments and biological substances status: Secondary | ICD-10-CM | POA: Insufficient documentation

## 2019-09-13 DIAGNOSIS — R011 Cardiac murmur, unspecified: Secondary | ICD-10-CM | POA: Insufficient documentation

## 2019-09-13 DIAGNOSIS — Z992 Dependence on renal dialysis: Secondary | ICD-10-CM | POA: Diagnosis not present

## 2019-09-13 DIAGNOSIS — R369 Urethral discharge, unspecified: Secondary | ICD-10-CM | POA: Diagnosis present

## 2019-09-13 DIAGNOSIS — N186 End stage renal disease: Secondary | ICD-10-CM | POA: Insufficient documentation

## 2019-09-13 DIAGNOSIS — I120 Hypertensive chronic kidney disease with stage 5 chronic kidney disease or end stage renal disease: Secondary | ICD-10-CM | POA: Diagnosis not present

## 2019-09-13 DIAGNOSIS — Z940 Kidney transplant status: Secondary | ICD-10-CM | POA: Insufficient documentation

## 2019-09-13 MED ORDER — LIDOCAINE HCL (PF) 1 % IJ SOLN
INTRAMUSCULAR | Status: AC
  Administered 2019-09-13: 10:00:00 0.9 mL
  Filled 2019-09-13: qty 5

## 2019-09-13 MED ORDER — AZITHROMYCIN 250 MG PO TABS
1000.0000 mg | ORAL_TABLET | Freq: Once | ORAL | Status: AC
  Administered 2019-09-13: 10:00:00 1000 mg via ORAL
  Filled 2019-09-13: qty 4

## 2019-09-13 MED ORDER — CEFTRIAXONE SODIUM 250 MG IJ SOLR
250.0000 mg | Freq: Once | INTRAMUSCULAR | Status: AC
  Administered 2019-09-13: 10:00:00 250 mg via INTRAMUSCULAR
  Filled 2019-09-13: qty 250

## 2019-09-13 NOTE — Discharge Instructions (Signed)
You were seen in the emergency department today with discharge.  We are treating you with antibiotic.  Please encourage your partner to be tested for STDs as well.  Return to the emergency department any new or suddenly worsening symptoms.

## 2019-09-13 NOTE — ED Triage Notes (Signed)
Check for possible STD

## 2019-09-13 NOTE — ED Provider Notes (Signed)
Emergency Department Provider Note   I have reviewed the triage vital signs and the nursing notes.   HISTORY  Chief Complaint STD Check   HPI Dylan Barber. is a 43 y.o. male presents to the ED with urethral discharge and concern for possible STD. Patient reports noting a watery discharge this AM without pain. No dysuria, hesitancy, or urgency. No fever. Patient is sexually active with one partner and not using condoms as they are trying to conceive. No lower abdominal pain. No joint pain.    Past Medical History:  Diagnosis Date  . Anemia   . Dialysis patient ([place])   . Heart murmur   . HIV disease ([place])   . Hypertension   . Lymphocytosis 06/16/2014  . Noncompliance 03/06/2015  . Post herpetic neuralgia 11/25/2016   Left V1 distribution  . Renal disorder   . Seizure [facility])     Patient Active Problem List   Diagnosis Date Noted  . Post herpetic neuralgia 11/25/2016  . Zoster ophthalmicus 08/09/2015  . Screening examination for venereal disease 04/06/2015  . Noncompliance 03/06/2015  . Parvovirus B19 infection 08/10/2014  . 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 Human immunodeficiency virus (HIV) disease ([place]) 03/28/2014  . Pulmonary edema 03/27/2014  . Muscle twitching 12/14/2013  . Internal and external bleeding hemorrhoids 08/26/2013  . Hypertension   . ESRD (end stage renal disease) on dialysis ([place]) 05/04/2013  . Normocytic anemia 05/04/2013    Past Surgical History:  Procedure Laterality Date  . AV FISTULA PLACEMENT Left 05/07/2013   Procedure: ARTERIOVENOUS (AV) FISTULA CREATION- LEFT;  Surgeon: Rosetta Posner, MD;  Location: [place];  Service: Vascular;  Laterality: Left;  . AV FISTULA PLACEMENT Left 08/12/2013   Procedure: ARTERIOVENOUS (AV) FISTULA CREATION BRACHIOCEPHALIC;  Surgeon: Angelia Mould, MD;  Location: [facility];  Service: Vascular;  Laterality: Left;  . INSERTION OF DIALYSIS CATHETER     x 2  . KIDNEY TRANSPLANT     . LIGATION OF ARTERIOVENOUS  FISTULA Left 08/12/2013   Procedure: LIGATION OF ARTERIOVENOUS  FISTULA RADIOCEPHALIC;  Surgeon: Angelia Mould, MD;  Location: [place];  Service: Vascular;  Laterality: Left;    Allergies Codeine, Eggs or egg-derived products, Heparin, Mercury, and Tomato  Family History  Problem Relation Age of Onset  . Kidney failure Mother   . Colon polyps Father   . Kidney disease Paternal Uncle   . Kidney disease Maternal Grandfather   . Kidney failure Maternal Uncle     Social History Social History   Tobacco Use  . Smoking status: Never Smoker  . Smokeless tobacco: Never Used  Substance Use Topics  . Alcohol use: No  . Drug use: No    Review of Systems  Constitutional: No fever/chills Eyes: No visual changes. ENT: No sore throat. Cardiovascular: Denies chest pain. Respiratory: Denies shortness of breath. Gastrointestinal: No abdominal pain.  No nausea, no vomiting.  No diarrhea.  No constipation. Genitourinary: Negative for dysuria. Positive urethral discharge.  Musculoskeletal: Negative for back pain. Skin: Negative for rash. Neurological: Negative for headaches, focal weakness or numbness.  10-point ROS otherwise negative.  ____________________________________________   PHYSICAL EXAM:  VITAL SIGNS: ED Triage Vitals  Enc Vitals Group     BP 09/13/19 0941 132/88     Pulse Rate 09/13/19 0941 82     Resp 09/13/19 0941 16     Temp 09/13/19 0941 98.2 F (36.8 C)     Temp Source 09/13/19  0941 Oral     SpO2 09/13/19 0941 97 %     Weight 09/13/19 0940 197 lb (89.4 kg)     Height 09/13/19 0940 5\' 7"  (1.702 m)   Constitutional: Alert and oriented. Well appearing and in no acute distress. Eyes: Conjunctivae are normal.  Head: Atraumatic. Nose: No congestion/rhinnorhea. Mouth/Throat: Mucous membranes are moist.  Oropharynx non-erythematous. Neck: No stridor.   Cardiovascular: Normal rate, regular rhythm.  Respiratory: Normal respiratory  effort. Gastrointestinal: No distention.  Musculoskeletal: No gross deformities of extremities. Neurologic:  Normal speech and language.  Skin:  Skin is warm, dry and intact. No rash noted.  ____________________________________________   LABS (all labs ordered are listed, but only abnormal results are displayed)  Labs Reviewed  GC/CHLAMYDIA PROBE AMP ([place]) NOT AT [facility]   ____________________________________________   PROCEDURES  Procedure(s) performed:   Procedures  None ____________________________________________   INITIAL IMPRESSION / ASSESSMENT AND PLAN / ED COURSE  Pertinent labs & imaging results that were available during my care of the patient were reviewed by me and considered in my medical decision making (see chart for details).   Patient presents to the ED with asymptomatic discharge. Low STD suspicion but patient will be traveling and reports not having access to follow up treatment if test is positive. Elects for empiric treatment in the ED. Urine sample sent for testing. Discussed barrier contraception plan until results come back and partner has been tested.    ____________________________________________  FINAL CLINICAL IMPRESSION(S) / ED DIAGNOSES  Final diagnoses:  Urethral discharge in male     MEDICATIONS GIVEN DURING THIS VISIT:  Medications  azithromycin (ZITHROMAX) tablet 1,000 mg (1,000 mg Oral Given 09/13/19 1022)  cefTRIAXone (ROCEPHIN) injection 250 mg (250 mg Intramuscular Given 09/13/19 1022)  lidocaine (PF) (XYLOCAINE) 1 % injection (0.9 mLs  Given 09/13/19 1023)     Note:  This document was prepared using Dragon voice recognition software and may include unintentional dictation errors.  Nanda Quinton, MD, [facility] Emergency Medicine    Long, Wonda Olds, MD 09/13/19 [phone]

## 2019-09-14 LAB — GC/CHLAMYDIA PROBE AMP (~~LOC~~) NOT AT ARMC
Chlamydia: NEGATIVE
Neisseria Gonorrhea: NEGATIVE

## 2020-02-02 ENCOUNTER — Encounter (HOSPITAL_BASED_OUTPATIENT_CLINIC_OR_DEPARTMENT_OTHER): Payer: Self-pay | Admitting: Emergency Medicine

## 2020-02-02 ENCOUNTER — Emergency Department (HOSPITAL_BASED_OUTPATIENT_CLINIC_OR_DEPARTMENT_OTHER)
Admission: EM | Admit: 2020-02-02 | Discharge: 2020-02-02 | Disposition: A | Discharge status: Home / Self Care | Payer: Medicare Other | Attending: Emergency Medicine | Admitting: Emergency Medicine

## 2020-02-02 ENCOUNTER — Other Ambulatory Visit: Payer: Self-pay

## 2020-02-02 DIAGNOSIS — R369 Urethral discharge, unspecified: Secondary | ICD-10-CM | POA: Insufficient documentation

## 2020-02-02 DIAGNOSIS — Z79899 Other long term (current) drug therapy: Secondary | ICD-10-CM | POA: Diagnosis not present

## 2020-02-02 DIAGNOSIS — Z992 Dependence on renal dialysis: Secondary | ICD-10-CM | POA: Diagnosis not present

## 2020-02-02 DIAGNOSIS — I120 Hypertensive chronic kidney disease with stage 5 chronic kidney disease or end stage renal disease: Secondary | ICD-10-CM | POA: Insufficient documentation

## 2020-02-02 DIAGNOSIS — N186 End stage renal disease: Secondary | ICD-10-CM | POA: Diagnosis not present

## 2020-02-02 DIAGNOSIS — B20 Human immunodeficiency virus [HIV] disease: Secondary | ICD-10-CM | POA: Insufficient documentation

## 2020-02-02 DIAGNOSIS — Z940 Kidney transplant status: Secondary | ICD-10-CM | POA: Insufficient documentation

## 2020-02-02 HISTORY — DX: Kidney transplant status: Z94.0

## 2020-02-02 LAB — URINALYSIS, ROUTINE W REFLEX MICROSCOPIC
Bilirubin Urine: NEGATIVE
Glucose, UA: NEGATIVE mg/dL
Hgb urine dipstick: NEGATIVE
Ketones, ur: NEGATIVE mg/dL
Leukocytes,Ua: NEGATIVE
Nitrite: NEGATIVE
Protein, ur: NEGATIVE mg/dL
Specific Gravity, Urine: 1.025 (ref 1.005–1.030)
pH: 6.5 (ref 5.0–8.0)

## 2020-02-02 MED ORDER — CEFTRIAXONE SODIUM 500 MG IJ SOLR
500.0000 mg | Freq: Once | INTRAMUSCULAR | Status: AC
  Administered 2020-02-02: 500 mg via INTRAMUSCULAR
  Filled 2020-02-02: qty 500

## 2020-02-02 MED ORDER — LIDOCAINE HCL (PF) 1 % IJ SOLN
INTRAMUSCULAR | Status: AC
  Administered 2020-02-02: 09:00:00 0.9 mL
  Filled 2020-02-02: qty 5

## 2020-02-02 MED ORDER — DOXYCYCLINE HYCLATE 100 MG PO CAPS: 100.0000 mg | ORAL_CAPSULE | Freq: Two times a day (BID) | ORAL | 0 refills | Status: AC

## 2020-02-02 NOTE — ED Triage Notes (Signed)
Pt concerned for STD.  Pt states he notice penile discharge yesterday.  No fever.

## 2020-02-02 NOTE — ED Provider Notes (Signed)
[facility] EMERGENCY DEPARTMENT Provider Note   CSN: [id number] Arrival date & time: 02/02/20  8119     History Chief Complaint  Patient presents with  . Penile Discharge    Dylan P Aedin Jeansonne. is a 44 y.o. male.  Presents to ER with penile discharge.  Past medical history HIV, reports strong compliance with HIV meds, no recent missed doses, undetectable viral load.  Yesterday had noted small white discharge from penis.  No pain, no swelling, no testicle pain.  Sexually active with male partner.  He has tested positive for other STDs previously.  HPI     Past Medical History:  Diagnosis Date  . Anemia   . Dialysis patient ([place])   . Heart murmur   . HIV disease ([place])   . Hypertension   . Lymphocytosis 06/16/2014  . Noncompliance 03/06/2015  . Post herpetic neuralgia 11/25/2016   Left V1 distribution  . Renal disorder   . Seizure ([place])   . Transplanted kidney     Patient Active Problem List   Diagnosis Date Noted  . Post herpetic neuralgia 11/25/2016  . Zoster ophthalmicus 08/09/2015  . Screening examination for venereal disease 04/06/2015  . Noncompliance 03/06/2015  . Parvovirus B19 infection 08/10/2014  . 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 Human immunodeficiency virus (HIV) disease ([place]) 03/28/2014  . Pulmonary edema 03/27/2014  . Muscle twitching 12/14/2013  . Internal and external bleeding hemorrhoids 08/26/2013  . Hypertension   . ESRD (end stage renal disease) on dialysis ([place]) 05/04/2013  . Normocytic anemia 05/04/2013    Past Surgical History:  Procedure Laterality Date  . AV FISTULA PLACEMENT Left 05/07/2013   Procedure: ARTERIOVENOUS (AV) FISTULA CREATION- LEFT;  Surgeon: Rosetta Posner, MD;  Location: [place];  Service: Vascular;  Laterality: Left;  . AV FISTULA PLACEMENT Left 08/12/2013   Procedure: ARTERIOVENOUS (AV) FISTULA CREATION BRACHIOCEPHALIC;  Surgeon: Angelia Mould, MD;  Location: [place];  Service:  Vascular;  Laterality: Left;  . INSERTION OF DIALYSIS CATHETER     x 2  . KIDNEY TRANSPLANT    . LIGATION OF ARTERIOVENOUS  FISTULA Left 08/12/2013   Procedure: LIGATION OF ARTERIOVENOUS  FISTULA RADIOCEPHALIC;  Surgeon: Angelia Mould, MD;  Location: [facility];  Service: Vascular;  Laterality: Left;       Family History  Problem Relation Age of Onset  . Kidney failure Mother   . Colon polyps Father   . Kidney disease Paternal Uncle   . Kidney disease Maternal Grandfather   . Kidney failure Maternal Uncle     Social History   Tobacco Use  . Smoking status: Never Smoker  . Smokeless tobacco: Never Used  Substance Use Topics  . Alcohol use: No  . Drug use: No    Home Medications Prior to Admission medications   Medication Sig Start Date End Date Taking? Authorizing Provider  abacavir (ZIAGEN) 300 MG tablet TAKE 2 TABLETS BY MOUTH DAILY. STOP ZIDOVUDINE, STOP PREZCOBIX 05/12/17   Comer, Okey Regal, MD  carbamazepine (TEGRETOL) 200 MG tablet 1 tablet by mouth twice daily. Call to schedule follow-up to receive any future refills 09/15/17   Kathrynn Ducking, MD  carvedilol (COREG) 6.25 MG tablet Take 1 tablet (6.25 mg total) by mouth 2 (two) times daily. 02/07/15   Jettie Booze, MD  doxycycline (VIBRAMYCIN) 100 MG capsule Take 1 capsule (100 mg total) by mouth 2 (two) times daily for 7 days. 02/02/20 02/09/20  Madalyn Rob  S, MD  K Phos Mono-Sod Phos Di & Mono (K-PHOS-NEUTRAL) [phone] MG TABS Take by mouth. 07/30/18   [provider]  lamivudine (EPIVIR) 100 MG tablet Take 0.5 tablets (50 mg total) by mouth daily. 05/12/17   Thayer Headings, MD  mycophenolate (MYFORTIC) 180 MG EC tablet Take by mouth. 03/01/19   [provider]  predniSONE (DELTASONE) 50 MG tablet 1 tablet PO QD X4 days 03/08/17   Ripley Fraise, MD  rilpivirine (EDURANT) 25 MG TABS tablet TAKE 1 TABLET(25 MG) BY MOUTH DAILY WITH BREAKFAST 05/12/17   Comer, Okey Regal, MD  sodium bicarbonate  650 MG tablet Take by mouth. 03/01/19   [provider]  sulfamethoxazole-trimethoprim (BACTRIM) 400-80 MG tablet Take by mouth. 02/26/19   [provider]  tacrolimus (PROGRAF) 1 MG capsule 5 mg in the am and 5 mg in the pm 03/11/19   [provider]  TIVICAY 50 MG tablet TAKE 1 TABLET(50 MG) BY MOUTH DAILY 01/10/17   Comer, Okey Regal, MD    Allergies    Codeine, Eggs or egg-derived products, Heparin, Mercury, and Tomato  Review of Systems   Review of Systems  Constitutional: Negative for chills and fever.  HENT: Negative for ear pain and sore throat.   Eyes: Negative for pain and visual disturbance.  Respiratory: Negative for cough and shortness of breath.   Cardiovascular: Negative for chest pain and palpitations.  Gastrointestinal: Negative for abdominal pain and vomiting.  Genitourinary: Positive for discharge. Negative for dysuria and hematuria.  Musculoskeletal: Negative for arthralgias and back pain.  Skin: Negative for color change and rash.  Neurological: Negative for seizures and syncope.  All other systems reviewed and are negative.   Physical Exam Updated Vital Signs Ht 5' 7.5" (1.715 m)   Wt 89.8 kg   BMI 30.55 kg/m   Physical Exam Vitals and nursing note reviewed.  Constitutional:      Appearance: He is well-developed.  HENT:     Head: Normocephalic and atraumatic.  Eyes:     Conjunctiva/sclera: Conjunctivae normal.  Cardiovascular:     Rate and Rhythm: Normal rate and regular rhythm.     Pulses: Normal pulses.  Pulmonary:     Effort: Pulmonary effort is normal. No respiratory distress.  Abdominal:     Palpations: Abdomen is soft.     Tenderness: There is no abdominal tenderness.  Musculoskeletal:        General: No deformity or signs of injury.     Cervical back: Neck supple. No rigidity.  Skin:    General: Skin is warm and dry.  Neurological:     General: No focal deficit present.     Mental Status: He is alert and oriented  to person, place, and time.     ED Results / Procedures / Treatments   Labs (all labs ordered are listed, but only abnormal results are displayed) Labs Reviewed  URINALYSIS, ROUTINE W REFLEX MICROSCOPIC  RPR  GC/CHLAMYDIA PROBE AMP ([place]) NOT AT [facility]    EKG None  Radiology No results found.  Procedures Procedures (including critical care time)  Medications Ordered in ED Medications  cefTRIAXone (ROCEPHIN) injection 500 mg (500 mg Intramuscular Given 02/02/20 0908)  lidocaine (PF) (XYLOCAINE) 1 % injection (0.9 mLs  Given 02/02/20 0912)    ED Course  I have reviewed the triage vital signs and the nursing notes.  Pertinent labs & imaging results that were available during my care of the patient were reviewed by me  and considered in my medical decision making (see chart for details).    MDM Rules/Calculators/A&P                      44 year old male presents to ER with penile discharge.  HIV positive on meds, reports well-controlled.  Denies any other symptoms.  Sent testing for gonorrhea and chlamydia, syphilis.  Treated empirically with Rocephin IM, gave Rx for doxycycline.    After the discussed management above, the patient was determined to be safe for discharge.  The patient was in agreement with this plan and all questions regarding their care were answered.  ED return precautions were discussed and the patient will return to the ED with any significant worsening of condition.   Final Clinical Impression(s) / ED Diagnoses Final diagnoses:  Penile discharge    Rx / DC Orders ED Discharge Orders         Ordered    doxycycline (VIBRAMYCIN) 100 MG capsule  2 times daily     02/02/20 0914           Dylan Starch, MD 02/02/20 1549

## 2020-02-02 NOTE — ED Notes (Signed)
Patient stated that he does not want to be tested for HIV, RPR since he is already has HIV.

## 2020-02-02 NOTE — Discharge Instructions (Signed)
Please take antibiotic as prescribed for your possible STD.  Please follow-up with your primary doctor regarding symptoms from today.  If you develop any pain, swelling in your penis, testicles, abdominal pain or other new concerning symptom, recommend return to ER for reassessment.

## 2020-02-03 LAB — RPR: RPR Ser Ql: NONREACTIVE

## 2020-02-03 LAB — GC/CHLAMYDIA PROBE AMP (~~LOC~~) NOT AT ARMC
Chlamydia: NEGATIVE
Neisseria Gonorrhea: NEGATIVE

## 2021-01-09 ENCOUNTER — Encounter: Payer: Self-pay | Admitting: Emergency Medicine

## 2021-01-09 ENCOUNTER — Ambulatory Visit
Admission: EM | Admit: 2021-01-09 | Discharge: 2021-01-09 | Disposition: A | Discharge status: Home / Self Care | Payer: 59 | Attending: Family Medicine | Admitting: Family Medicine

## 2021-01-09 ENCOUNTER — Other Ambulatory Visit: Payer: Self-pay

## 2021-01-09 DIAGNOSIS — N390 Urinary tract infection, site not specified: Secondary | ICD-10-CM | POA: Insufficient documentation

## 2021-01-09 DIAGNOSIS — Z7253 High risk bisexual behavior: Secondary | ICD-10-CM | POA: Insufficient documentation

## 2021-01-09 DIAGNOSIS — R300 Dysuria: Secondary | ICD-10-CM | POA: Insufficient documentation

## 2021-01-09 LAB — URINALYSIS, COMPLETE (UACMP) WITH MICROSCOPIC
Bilirubin Urine: NEGATIVE
Glucose, UA: NEGATIVE mg/dL
Ketones, ur: NEGATIVE mg/dL
Nitrite: POSITIVE — AB
Specific Gravity, Urine: >1.03 — ABNORMAL HIGH (ref 1.005–1.030)
Squamous Epithelial / HPF: NONE SEEN (ref 0–5)
WBC, UA: >50 WBC/hpf (ref 0–5)
pH: 5.5 (ref 5.0–8.0)

## 2021-01-09 MED ORDER — NITROFURANTOIN MONOHYD MACRO 100 MG PO CAPS: 100.0000 mg | ORAL_CAPSULE | Freq: Two times a day (BID) | ORAL | 0 refills | Status: AC

## 2021-01-09 MED ORDER — CEFTRIAXONE SODIUM 500 MG IJ SOLR
250.0000 mg | Freq: Once | INTRAMUSCULAR | Status: AC
  Administered 2021-01-09: 250 mg via INTRAMUSCULAR

## 2021-01-09 MED ORDER — DOXYCYCLINE HYCLATE 100 MG PO CAPS: 100.0000 mg | ORAL_CAPSULE | Freq: Two times a day (BID) | ORAL | 0 refills | Status: AC

## 2021-01-09 NOTE — ED Provider Notes (Signed)
[facility]    CSN: [id number] Arrival date & time: 01/09/21  1227      History   Chief Complaint Chief Complaint  Patient presents with  . Dysuria  . Exposure to STD    HPI Dylan Barber. is a 45 y.o. male presenting for dysuria and urinary frequency since this morning.  Patient is afraid he might have an STI.  He states that he was sexually active with male last week and use protection but the condom broke.  He is sexually active with both women and male partners and states that he generally uses protection.  He has had gonorrhea and chlamydia before and states that his symptoms do feel similar to an STI.  His past medical history is significant for HIV but he states that he is "undetectable."  Also he has history of polycystic kidney disease and has had a renal transplant of the right kidney.  He states that his current kidney function is "really good."  He is not on dialysis.  Presently he denies fever, fatigue, chills, abdominal or pelvic pain, back pain, testicular pain, urethral discharge, genital lesions or rashes.  He has no other concerns.  HPI  Past Medical History:  Diagnosis Date  . Anemia   . Dialysis patient ([place])   . Heart murmur   . HIV disease ([place])   . Hypertension   . Lymphocytosis 06/16/2014  . Noncompliance 03/06/2015  . Post herpetic neuralgia 11/25/2016   Left V1 distribution  . Renal disorder   . Seizure ([place])   . Transplanted kidney     Patient Active Problem List   Diagnosis Date Noted  . Post herpetic neuralgia 11/25/2016  . Zoster ophthalmicus 08/09/2015  . Screening examination for venereal disease 04/06/2015  . Noncompliance 03/06/2015  . Parvovirus B19 infection 08/10/2014  . 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 Human immunodeficiency virus (HIV) disease ([place]) 03/28/2014  . Pulmonary edema 03/27/2014  . Muscle twitching 12/14/2013  . Internal and external bleeding hemorrhoids 08/26/2013  .  Hypertension   . ESRD (end stage renal disease) on dialysis ([place]) 05/04/2013  . Normocytic anemia 05/04/2013    Past Surgical History:  Procedure Laterality Date  . AV FISTULA PLACEMENT Left 05/07/2013   Procedure: ARTERIOVENOUS (AV) FISTULA CREATION- LEFT;  Surgeon: Rosetta Posner, MD;  Location: [place];  Service: Vascular;  Laterality: Left;  . AV FISTULA PLACEMENT Left 08/12/2013   Procedure: ARTERIOVENOUS (AV) FISTULA CREATION BRACHIOCEPHALIC;  Surgeon: Angelia Mould, MD;  Location: [place];  Service: Vascular;  Laterality: Left;  . INSERTION OF DIALYSIS CATHETER     x 2  . KIDNEY TRANSPLANT    . LIGATION OF ARTERIOVENOUS  FISTULA Left 08/12/2013   Procedure: LIGATION OF ARTERIOVENOUS  FISTULA RADIOCEPHALIC;  Surgeon: Angelia Mould, MD;  Location: [place];  Service: Vascular;  Laterality: Left;       Home Medications    Prior to Admission medications   Medication Sig Start Date End Date Taking? Authorizing Provider  abacavir (ZIAGEN) 300 MG tablet TAKE 2 TABLETS BY MOUTH DAILY. STOP ZIDOVUDINE, STOP PREZCOBIX 05/12/17  Yes Comer, Okey Regal, MD  carbamazepine (TEGRETOL) 200 MG tablet 1 tablet by mouth twice daily. Call to schedule follow-up to receive any future refills 09/15/17  Yes Kathrynn Ducking, MD  carvedilol (COREG) 6.25 MG tablet Take 1 tablet (6.25 mg total) by mouth 2 (two) times daily. 02/07/15  Yes Jettie Booze, MD  doxycycline (  VIBRAMYCIN) 100 MG capsule Take 1 capsule (100 mg total) by mouth 2 (two) times daily for 7 days. 01/09/21 01/16/21 Yes Laurene Footman B, PA-C  K Phos Mono-Sod Phos Di & Mono (K-PHOS-NEUTRAL) [phone] MG TABS Take by mouth. 07/30/18  Yes [provider]  lamivudine (EPIVIR) 100 MG tablet Take 0.5 tablets (50 mg total) by mouth daily. 05/12/17  Yes Comer, Okey Regal, MD  mycophenolate (MYFORTIC) 180 MG EC tablet Take by mouth. 03/01/19  Yes [provider]  nitrofurantoin, macrocrystal-monohydrate, (MACROBID) 100 MG capsule  Take 1 capsule (100 mg total) by mouth 2 (two) times daily for 7 days. 01/09/21 01/16/21 Yes Danton Clap, PA-C  predniSONE (DELTASONE) 50 MG tablet 1 tablet PO QD X4 days 03/08/17  Yes Ripley Fraise, MD  rilpivirine (EDURANT) 25 MG TABS tablet TAKE 1 TABLET(25 MG) BY MOUTH DAILY WITH BREAKFAST 05/12/17  Yes Comer, Okey Regal, MD  sodium bicarbonate 650 MG tablet Take by mouth. 03/01/19  Yes [provider]  sulfamethoxazole-trimethoprim (BACTRIM) 400-80 MG tablet Take by mouth. 02/26/19  Yes [provider]  tacrolimus (PROGRAF) 1 MG capsule 5 mg in the am and 5 mg in the pm 03/11/19  Yes [provider]  TIVICAY 50 MG tablet TAKE 1 TABLET(50 MG) BY MOUTH DAILY 01/10/17  Yes Comer, Okey Regal, MD    Family History Family History  Problem Relation Age of Onset  . Kidney failure Mother   . Colon polyps Father   . Kidney disease Paternal Uncle   . Kidney disease Maternal Grandfather   . Kidney failure Maternal Uncle     Social History Social History   Tobacco Use  . Smoking status: Never Smoker  . Smokeless tobacco: Never Used  Vaping Use  . Vaping Use: Never used  Substance Use Topics  . Alcohol use: No  . Drug use: No     Allergies   Codeine, Eggs or egg-derived products, Heparin, Mercury, and Tomato   Review of Systems Review of Systems  Constitutional: Negative for fatigue and fever.  HENT: Negative for sore throat.   Eyes: Negative for discharge and redness.  Respiratory: Negative for cough.   Gastrointestinal: Negative for abdominal pain, nausea and vomiting.  Genitourinary: Positive for dysuria and frequency. Negative for genital sores, hematuria, penile discharge, penile pain, penile swelling, scrotal swelling, testicular pain and urgency.  Musculoskeletal: Negative for arthralgias.  Skin: Negative for rash.  Neurological: Negative for weakness.     Physical Exam Triage Vital Signs ED Triage Vitals  Enc Vitals Group     BP 01/09/21 1301  (!) 147/87     Pulse Rate 01/09/21 1301 91     Resp 01/09/21 1301 18     Temp 01/09/21 1301 98.3 F (36.8 C)     Temp Source 01/09/21 1301 Oral     SpO2 01/09/21 1301 100 %     Weight 01/09/21 1259 197 lb 15.6 oz (89.8 kg)     Height 01/09/21 1259 5' 7.5" (1.715 m)     Head Circumference --      Peak Flow --      Pain Score 01/09/21 1259 0     Pain Loc --      Pain Edu? --      Excl. in [place]? --    No data found.  Updated Vital Signs BP (!) 147/87 (BP Location: Right Arm)   Pulse 91   Temp 98.3 F (36.8 C) (Oral)   Resp 18   Ht  5' 7.5" (1.715 m)   Wt 197 lb 15.6 oz (89.8 kg)   SpO2 100%   BMI 30.55 kg/m       Physical Exam Vitals and nursing note reviewed.  Constitutional:      General: He is not in acute distress.    Appearance: Normal appearance. He is well-developed and well-nourished. He is not ill-appearing.  HENT:     Head: Normocephalic and atraumatic.  Eyes:     General: No scleral icterus.    Conjunctiva/sclera: Conjunctivae normal.  Cardiovascular:     Rate and Rhythm: Normal rate and regular rhythm.     Heart sounds: Normal heart sounds.  Pulmonary:     Effort: Pulmonary effort is normal. No respiratory distress.     Breath sounds: Normal breath sounds.  Abdominal:     Palpations: Abdomen is soft.     Tenderness: There is no abdominal tenderness.  Musculoskeletal:        General: No edema.     Cervical back: Neck supple.  Skin:    General: Skin is warm and dry.  Neurological:     General: No focal deficit present.     Mental Status: He is alert. Mental status is at baseline.     Motor: No weakness.     Gait: Gait normal.  Psychiatric:        Mood and Affect: Mood and affect and mood normal.        Behavior: Behavior normal.        Thought Content: Thought content normal.      UC Treatments / Results  Labs (all labs ordered are listed, but only abnormal results are displayed) Labs Reviewed  URINALYSIS, COMPLETE (UACMP) WITH MICROSCOPIC  - Abnormal; Notable for the following components:      Result Value   APPearance CLOUDY (*)    Specific Gravity, Urine >1.030 (*)    Hgb urine dipstick TRACE (*)    Protein, ur TRACE (*)    Nitrite POSITIVE (*)    Leukocytes,Ua SMALL (*)    Bacteria, UA MANY (*)    All other components within normal limits  URINE CULTURE    EKG   Radiology No results found.  Procedures Procedures (including critical care time)  Medications Ordered in UC Medications  cefTRIAXone (ROCEPHIN) injection 250 mg (250 mg Intramuscular Given 01/09/21 1331)  cefTRIAXone (ROCEPHIN) injection 250 mg (250 mg Intramuscular Given 01/09/21 1332)    Initial Impression / Assessment and Plan / UC Course  I have reviewed the triage vital signs and the nursing notes.  Pertinent labs & imaging results that were available during my care of the patient were reviewed by me and considered in my medical decision making (see chart for details).   45 year old male presenting for dysuria and urinary frequency since earlier today. He does engage in intercourse with male and male partners and recently did have a condom break when having intercourse with a male partner. Concern for STIs.  Patient left sample for UA and GC/chlamydia, but sample was not enough to run for GC/chlamydia and patient did not want to wait to give another sample or obtain a urethral swab.  UA shows greater than 1.030 specific gravity, trace blood, positive nitrites and small leukocytes. Will send urine for culture. Treating for UTI with Macrobid.  Since patient is high risk for complications from untreated STIs, will cover for gonorrhea and chlamydia with ceftriaxone given in clinic and I sent doxycycline to pharmacy. Advised for him  to follow-up with our clinic as needed.  Final Clinical Impressions(s) / UC Diagnoses   Final diagnoses:  Dysuria  Lower urinary tract infectious disease  High risk bisexual behavior     Discharge Instructions      You have been treated for gonorrhea and chlamydia today.  I sent a medication to the pharmacy that will treat chlamydia.  I will culture the urine and if bacteria grows we will treat for UTI.  Inform partner or partners if you have any positive test results.  Return or go to ED for any acute worsening of symptoms    ED Prescriptions    Medication Sig Dispense Auth. Provider   doxycycline (VIBRAMYCIN) 100 MG capsule Take 1 capsule (100 mg total) by mouth 2 (two) times daily for 7 days. 14 capsule Laurene Footman B, PA-C   nitrofurantoin, macrocrystal-monohydrate, (MACROBID) 100 MG capsule Take 1 capsule (100 mg total) by mouth 2 (two) times daily for 7 days. 14 capsule Danton Clap, PA-C     PDMP not reviewed this encounter.   Danton Clap, PA-C 01/09/21 [phone]

## 2021-01-09 NOTE — Discharge Instructions (Signed)
You have been treated for gonorrhea and chlamydia today.  I sent a medication to the pharmacy that will treat chlamydia.  I will culture the urine and if bacteria grows we will treat for UTI.  Inform partner or partners if you have any positive test results.  Return or go to ED for any acute worsening of symptoms

## 2021-01-09 NOTE — ED Triage Notes (Signed)
Pt c/o dysuria, and urinary frequency. Started this morning. He states he thinks he has an STD. No known exposure.

## 2021-05-08 ENCOUNTER — Other Ambulatory Visit: Payer: Self-pay

## 2021-05-08 ENCOUNTER — Emergency Department (HOSPITAL_BASED_OUTPATIENT_CLINIC_OR_DEPARTMENT_OTHER)
Admission: EM | Admit: 2021-05-08 | Discharge: 2021-05-08 | Disposition: A | Discharge status: Home / Self Care | Payer: Medicare Other | Attending: Emergency Medicine | Admitting: Emergency Medicine

## 2021-05-08 ENCOUNTER — Encounter (HOSPITAL_BASED_OUTPATIENT_CLINIC_OR_DEPARTMENT_OTHER): Payer: Self-pay | Admitting: Obstetrics and Gynecology

## 2021-05-08 DIAGNOSIS — Z79899 Other long term (current) drug therapy: Secondary | ICD-10-CM | POA: Insufficient documentation

## 2021-05-08 DIAGNOSIS — Z21 Asymptomatic human immunodeficiency virus [HIV] infection status: Secondary | ICD-10-CM | POA: Diagnosis not present

## 2021-05-08 DIAGNOSIS — N186 End stage renal disease: Secondary | ICD-10-CM | POA: Insufficient documentation

## 2021-05-08 DIAGNOSIS — I120 Hypertensive chronic kidney disease with stage 5 chronic kidney disease or end stage renal disease: Secondary | ICD-10-CM | POA: Diagnosis not present

## 2021-05-08 DIAGNOSIS — L723 Sebaceous cyst: Secondary | ICD-10-CM | POA: Insufficient documentation

## 2021-05-08 DIAGNOSIS — Z992 Dependence on renal dialysis: Secondary | ICD-10-CM | POA: Diagnosis not present

## 2021-05-08 MED ORDER — DOXYCYCLINE HYCLATE 100 MG PO CAPS: 100.0000 mg | ORAL_CAPSULE | Freq: Two times a day (BID) | ORAL | 0 refills | Status: DC

## 2021-05-08 MED ORDER — DOXYCYCLINE HYCLATE 100 MG PO TABS
100.0000 mg | ORAL_TABLET | Freq: Once | ORAL | Status: AC
  Administered 2021-05-08: 100 mg via ORAL
  Filled 2021-05-08: qty 1

## 2021-05-08 NOTE — Discharge Instructions (Addendum)
Take the doxycycline as directed.  Return for any new or worse symptoms in the area.  Would expect the antibiotics to be that will settle it down.  Also given information to about general surgery if you want to make an appointment in the future when this settles down to consider excision.

## 2021-05-08 NOTE — ED Triage Notes (Signed)
Patient reports to the ER for abscess in the right axilla region. Patient reports he is a kidney transplant patient and needs it lanced

## 2021-05-08 NOTE — ED Provider Notes (Signed)
[place] EMERGENC[facility] DEPT Provider Note   CSN: [id number] Arrival date & time: 05/08/21  1827     History Chief Complaint  Patient presents with   Abscess    Dylan Barber. is a 45 y.o. male.  Patient with a longstanding cyst to his right axillary area.  That started to get inflamed on Sunday night.  It spontaneously drained a lot of cheesy type material.  Patient stated that time it got red.  And got tender.  He has never had it I&D did in the past.  Does not have any history of any other axillary or groin cyst problems.  Patient is a renal transplant patient.  Dates that his renal function has been steady and at baseline.  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   Human immunodeficiency virus (HIV) disease ([place]) 03/28/2014   Pulmonary edema 03/27/2014   Muscle twitching 12/14/2013   Internal and external bleeding hemorrhoids 08/26/2013   Hypertension    ESRD (end stage renal disease) on dialysis ([place]) 05/04/2013   Normocytic anemia 05/04/2013    Past Surgical History:  Procedure Laterality Date   AV FISTULA PLACEMENT Left 05/07/2013   Procedure: ARTERIOVENOUS (AV) FISTULA CREATION- LEFT;  Surgeon: Rosetta Posner, MD;  Location: [place];  Service: Vascular;  Laterality: Left;   AV FISTULA PLACEMENT Left 08/12/2013   Procedure: ARTERIOVENOUS (AV) FISTULA CREATION  BRACHIOCEPHALIC;  Surgeon: Angelia Mould, MD;  Location: [place];  Service: Vascular;  Laterality: Left;   INSERTION OF DIALYSIS CATHETER     x 2   KIDNEY TRANSPLANT     LIGATION OF ARTERIOVENOUS  FISTULA Left 08/12/2013   Procedure: LIGATION OF ARTERIOVENOUS  FISTULA RADIOCEPHALIC;  Surgeon: Angelia Mould, MD;  [facility];  Service: Vascular;  Laterality: Left;       Family History  Problem Relation Age of Onset   Kidney failure Mother    Colon polyps Father    Kidney disease Paternal Uncle    Kidney disease Maternal Grandfather    Kidney failure Maternal Uncle     Social History   Tobacco Use   Smoking status: Never   Smokeless tobacco: Never  Vaping Use   Vaping Use: Never used  Substance Use Topics   Alcohol use: Yes    Comment: Rarely   Drug use: No    Home Medications Prior to Admission medications   Medication Sig Start Date End Date Taking? Authorizing Provider  doxycycline (VIBRAMYCIN) 100 MG capsule Take 1 capsule (100 mg total) by mouth 2 (two) times daily. 05/08/21  Yes Fredia Sorrow, MD  abacavir (ZIAGEN) 300 MG tablet TAKE 2 TABLETS BY MOUTH DAILY. STOP ZIDOVUDINE, STOP PREZCOBIX 05/12/17   Comer, Okey Regal, MD  carbamazepine (TEGRETOL) 200 MG tablet 1 tablet by mouth twice daily. Call to  schedule follow-up to receive any future refills 09/15/17   Kathrynn Ducking, MD  carvedilol (COREG) 6.25 MG tablet Take 1 tablet (6.25 mg total) by mouth 2 (two) times daily. 02/07/15   Jettie Booze, MD  K Phos Mono-Sod Phos Di & Mono (K-PHOS-NEUTRAL) [phone] MG TABS Take by mouth. 07/30/18   [provider]  lamivudine (EPIVIR) 100 MG tablet Take 0.5 tablets (50 mg total) by mouth daily. 05/12/17   Thayer Headings, MD  mycophenolate (MYFORTIC) 180 MG EC tablet Take by mouth. 03/01/19   [provider]  predniSONE (DELTASONE) 50 MG tablet 1 tablet PO QD X4 days 03/08/17   Ripley Fraise, MD  rilpivirine (EDURANT) 25 MG TABS tablet  TAKE 1 TABLET(25 MG) BY MOUTH DAILY WITH BREAKFAST 05/12/17   Comer, Okey Regal, MD  sodium bicarbonate 650 MG tablet Take by mouth. 03/01/19   [provider]  sulfamethoxazole-trimethoprim (BACTRIM) 400-80 MG tablet Take by mouth. 02/26/19   [provider]  tacrolimus (PROGRAF) 1 MG capsule 5 mg in the am and 5 mg in the pm 03/11/19   [provider]  TIVICAY 50 MG tablet TAKE 1 TABLET(50 MG) BY MOUTH DAILY 01/10/17   Comer, Okey Regal, MD    Allergies    Codeine, Eggs or egg-derived products, Heparin, Mercury, and Tomato  Review of Systems   Review of Systems  Constitutional:  Negative for chills and fever.  HENT:  Negative for ear pain and sore throat.   Eyes:  Negative for pain and visual disturbance.  Respiratory:  Negative for cough and shortness of breath.   Cardiovascular:  Negative for chest pain and palpitations.  Gastrointestinal:  Negative for abdominal pain and vomiting.  Genitourinary:  Negative for dysuria and hematuria.  Musculoskeletal:  Negative for arthralgias and back pain.  Skin:  Negative for color change and rash.  Neurological:  Negative for seizures and syncope.  All other systems reviewed and are negative.  Physical Exam Updated Vital Signs BP (!) 148/109 (BP Location: Right Arm)   Pulse 72   Temp 98.5 F (36.9 C) (Oral)   Resp 18   SpO2 98%   Physical Exam Vitals and nursing note reviewed.  Constitutional:      Appearance: He is well-developed.  HENT:     Head: Normocephalic and atraumatic.  Eyes:     Conjunctiva/sclera: Conjunctivae normal.  Cardiovascular:     Rate and Rhythm: Normal rate and regular rhythm.     Heart sounds: No murmur heard. Pulmonary:     Effort: Pulmonary effort is normal. No respiratory distress.     Breath sounds: Normal breath sounds.  Abdominal:     Palpations: Abdomen is soft.     Tenderness: There is no abdominal tenderness.  Musculoskeletal:        General: Swelling and tenderness present.      Cervical back: Neck supple.     Comments: Right axillary area with an area of erythema measuring about 2 x 4 cm.  Area of induration measuring about 3 cm.  There is a punctate core.  No fluctuance.  Mild tenderness.  Skin:    General: Skin is warm and dry.     Capillary Refill: Capillary refill takes less than 2 seconds.  Neurological:     General: No focal deficit present.     Mental Status: He is alert and oriented to person, place, and time.    ED Results / Procedures / Treatments   Labs (all labs  ordered are listed, but only abnormal results are displayed) Labs Reviewed - No data to display  EKG None  Radiology No results found.  Procedures Procedures   Medications Ordered in ED Medications  doxycycline (VIBRA-TABS) tablet 100 mg (100 mg Oral Given 05/08/21 2248)    ED Course  I have reviewed the triage vital signs and the nursing notes.  Pertinent labs & imaging results that were available during my care of the patient were reviewed by me and considered in my medical decision making (see chart for details).    MDM Rules/Calculators/A&P                          We will treat with doxycycline antibiotic.  Nothing to I&D at this time.  Patient will return if he gets worse.  Also given referral to general surgery if when this settles down since its been there long-term if he wants it excised.  First dose of doxycycline given here tonight    Final Clinical Impression(s) / ED Diagnoses Final diagnoses:  Sebaceous cyst of axilla    Rx / DC Orders ED Discharge Orders          Ordered    doxycycline (VIBRAMYCIN) 100 MG capsule  2 times daily        05/08/21 2312             Fredia Sorrow, MD 05/08/21 2315

## 2021-12-08 ENCOUNTER — Other Ambulatory Visit: Payer: Self-pay

## 2021-12-08 ENCOUNTER — Encounter (HOSPITAL_BASED_OUTPATIENT_CLINIC_OR_DEPARTMENT_OTHER): Payer: Self-pay

## 2021-12-08 ENCOUNTER — Emergency Department (HOSPITAL_BASED_OUTPATIENT_CLINIC_OR_DEPARTMENT_OTHER): Payer: 59 | Admitting: Radiology

## 2021-12-08 ENCOUNTER — Emergency Department (HOSPITAL_BASED_OUTPATIENT_CLINIC_OR_DEPARTMENT_OTHER)
Admission: EM | Admit: 2021-12-08 | Discharge: 2021-12-08 | Discharge status: Against Medical Advice | Payer: 59 | Attending: Emergency Medicine | Admitting: Emergency Medicine

## 2021-12-08 ENCOUNTER — Emergency Department (HOSPITAL_BASED_OUTPATIENT_CLINIC_OR_DEPARTMENT_OTHER): Payer: 59

## 2021-12-08 DIAGNOSIS — Z20822 Contact with and (suspected) exposure to covid-19: Secondary | ICD-10-CM | POA: Insufficient documentation

## 2021-12-08 DIAGNOSIS — Z940 Kidney transplant status: Secondary | ICD-10-CM | POA: Diagnosis not present

## 2021-12-08 DIAGNOSIS — R100 Acute abdomen: Secondary | ICD-10-CM | POA: Diagnosis present

## 2021-12-08 DIAGNOSIS — N179 Acute kidney failure, unspecified: Secondary | ICD-10-CM | POA: Diagnosis not present

## 2021-12-08 DIAGNOSIS — R34 Anuria and oliguria: Secondary | ICD-10-CM | POA: Diagnosis not present

## 2021-12-08 DIAGNOSIS — E860 Dehydration: Secondary | ICD-10-CM | POA: Diagnosis present

## 2021-12-08 DIAGNOSIS — R5383 Other fatigue: Secondary | ICD-10-CM | POA: Diagnosis not present

## 2021-12-08 DIAGNOSIS — K449 Diaphragmatic hernia without obstruction or gangrene: Secondary | ICD-10-CM | POA: Insufficient documentation

## 2021-12-08 DIAGNOSIS — Z1152 Encounter for screening for COVID-19: Secondary | ICD-10-CM | POA: Insufficient documentation

## 2021-12-08 DIAGNOSIS — R197 Diarrhea, unspecified: Secondary | ICD-10-CM

## 2021-12-08 DIAGNOSIS — R110 Nausea: Secondary | ICD-10-CM | POA: Insufficient documentation

## 2021-12-08 LAB — LACTIC ACID, PLASMA
Lactic Acid, Venous: 2 mmol/L (ref 0.5–1.9)
Lactic Acid, Venous: 1.4 mmol/L (ref 0.5–1.9)

## 2021-12-08 LAB — COMPREHENSIVE METABOLIC PANEL
ALT: 23 U/L (ref 0–44)
AST: 28 U/L (ref 15–41)
Albumin: 4 g/dL (ref 3.5–5.0)
Alkaline Phosphatase: 65 U/L (ref 38–126)
Anion gap: 23 — ABNORMAL HIGH (ref 5–15)
BUN: 80 mg/dL — ABNORMAL HIGH (ref 6–20)
CO2: 17 mmol/L — ABNORMAL LOW (ref 22–32)
Calcium: 9 mg/dL (ref 8.9–10.3)
Chloride: 88 mmol/L — ABNORMAL LOW (ref 98–111)
Creatinine, Ser: 9.37 mg/dL — ABNORMAL HIGH (ref 0.61–1.24)
GFR, Estimated: 6 mL/min — ABNORMAL LOW (ref >60)
Glucose, Bld: 109 mg/dL — ABNORMAL HIGH (ref 70–99)
Potassium: 3.1 mmol/L — ABNORMAL LOW (ref 3.5–5.1)
Sodium: 128 mmol/L — ABNORMAL LOW (ref 135–145)
Total Bilirubin: 0.6 mg/dL (ref 0.3–1.2)
Total Protein: 8.7 g/dL — ABNORMAL HIGH (ref 6.5–8.1)

## 2021-12-08 LAB — URINALYSIS, ROUTINE W REFLEX MICROSCOPIC
Glucose, UA: 100 mg/dL — AB
Ketones, ur: 15 mg/dL — AB
Nitrite: POSITIVE — AB
Protein, ur: >300 mg/dL — AB
Specific Gravity, Urine: 1.025 (ref 1.005–1.030)
pH: 6.5 (ref 5.0–8.0)

## 2021-12-08 LAB — CBC WITH DIFFERENTIAL/PLATELET
Abs Immature Granulocytes: 0.12 10*3/uL — ABNORMAL HIGH (ref 0.00–0.07)
Basophils Absolute: 0 10*3/uL (ref 0.0–0.1)
Basophils Relative: 0 %
Eosinophils Absolute: 0 10*3/uL (ref 0.0–0.5)
Eosinophils Relative: 0 %
HCT: 42.4 % (ref 39.0–52.0)
Hemoglobin: 14.4 g/dL (ref 13.0–17.0)
Immature Granulocytes: 1 %
Lymphocytes Relative: 6 %
Lymphs Abs: 1.3 10*3/uL (ref 0.7–4.0)
MCH: 29.6 pg (ref 26.0–34.0)
MCHC: 34 g/dL (ref 30.0–36.0)
MCV: 87.1 fL (ref 80.0–100.0)
Monocytes Absolute: 2.7 10*3/uL — ABNORMAL HIGH (ref 0.1–1.0)
Monocytes Relative: 13 %
Neutro Abs: 17.3 10*3/uL — ABNORMAL HIGH (ref 1.7–7.7)
Neutrophils Relative %: 80 %
Platelets: 180 10*3/uL (ref 150–400)
RBC: 4.87 MIL/uL (ref 4.22–5.81)
RDW: 13.5 % (ref 11.5–15.5)
WBC: 21.5 10*3/uL — ABNORMAL HIGH (ref 4.0–10.5)
nRBC: 0 % (ref 0.0–0.2)

## 2021-12-08 LAB — RESP PANEL BY RT-PCR (FLU A&B, COVID) ARPGX2
Influenza A by PCR: NEGATIVE
Influenza B by PCR: NEGATIVE
SARS Coronavirus 2 by RT PCR: NEGATIVE

## 2021-12-08 LAB — PROTIME-INR
INR: 1.2 (ref 0.8–1.2)
Prothrombin Time: 14.8 seconds (ref 11.4–15.2)

## 2021-12-08 LAB — URINALYSIS, MICROSCOPIC (REFLEX)
RBC / HPF: >50 RBC/hpf (ref 0–5)
WBC, UA: >50 WBC/hpf (ref 0–5)

## 2021-12-08 MED ORDER — ONDANSETRON HCL 4 MG/2ML IJ SOLN
4.0000 mg | Freq: Once | INTRAMUSCULAR | Status: AC
  Administered 2021-12-08: 4 mg via INTRAVENOUS
  Filled 2021-12-08: qty 2

## 2021-12-08 MED ORDER — SODIUM CHLORIDE 0.9 % IV BOLUS
1000.0000 mL | Freq: Once | INTRAVENOUS | Status: AC
  Administered 2021-12-08: 1000 mL via INTRAVENOUS

## 2021-12-08 NOTE — Discharge Instructions (Signed)
You have decided to not wait for transport to [facility].  You have stated to me that your plan is to go there directly from here.  He also understands the risk of death by not waiting for proper transport and accept this.  Please go there directly

## 2021-12-08 NOTE — ED Triage Notes (Signed)
Pt c/o fever, N/V/D that started on 1/25. Pt states the diarrheahas resolved, but nausea persists. Pt states he had negative COVID/Flu tests. Pt reports that his nephrologist told him to come to the ED for decreased UO and "no tears when I cry." Pt with hx of kidney transplant. Pt is concerned he is dehydrated.

## 2021-12-08 NOTE — ED Provider Notes (Signed)
[place] EMERGENCY DEPT Provider Note   CSN: [id number] Arrival date & time: 12/08/21  1118     History  No chief complaint on file.   Dylan Barber. is a 46 y.o. male.  Pt is a 46 yo male with PMH of renal transplant right side in June of 2019 at [facility] with nephrologist Dr. Janine Limbo presenting for nausea, diarrhea, fatigue, leathery, and decreased urination secondary to dehydration.   The history is provided by the patient. No language interpreter was used.      Home Medications Prior to Admission medications   Medication Sig Start Date End Date Taking? Authorizing Provider  abacavir (ZIAGEN) 300 MG tablet TAKE 2 TABLETS BY MOUTH DAILY. STOP ZIDOVUDINE, STOP PREZCOBIX 05/12/17   Comer, Okey Regal, MD  carbamazepine (TEGRETOL) 200 MG tablet 1 tablet by mouth twice daily. Call to schedule follow-up to receive any future refills 09/15/17   Kathrynn Ducking, MD  carvedilol (COREG) 6.25 MG tablet Take 1 tablet (6.25 mg total) by mouth 2 (two) times daily. 02/07/15   Jettie Booze, MD  doxycycline (VIBRAMYCIN) 100 MG capsule Take 1 capsule (100 mg total) by mouth 2 (two) times daily. 05/08/21   Fredia Sorrow, MD  K Phos Mono-Sod Phos Di & Mono (K-PHOS-NEUTRAL) [phone] MG TABS Take by mouth. 07/30/18   [provider]  lamivudine (EPIVIR) 100 MG tablet Take 0.5 tablets (50 mg total) by mouth daily. 05/12/17   Thayer Headings, MD  mycophenolate (MYFORTIC) 180 MG EC tablet Take by mouth. 03/01/19   [provider]  predniSONE (DELTASONE) 50 MG tablet 1 tablet PO QD X4 days 03/08/17   Ripley Fraise, MD  rilpivirine (EDURANT) 25 MG TABS tablet TAKE 1 TABLET(25 MG) BY MOUTH DAILY WITH BREAKFAST 05/12/17   Comer, Okey Regal, MD  sodium bicarbonate 650 MG tablet Take by mouth. 03/01/19   [provider]  sulfamethoxazole-trimethoprim (BACTRIM) 400-80 MG tablet Take by mouth. 02/26/19   [provider]  tacrolimus  (PROGRAF) 1 MG capsule 5 mg in the am and 5 mg in the pm 03/11/19   [provider]  TIVICAY 50 MG tablet TAKE 1 TABLET(50 MG) BY MOUTH DAILY 01/10/17   Comer, Okey Regal, MD      Allergies    Codeine, Eggs or egg-derived products, Heparin, Mercury, and Tomato    Review of Systems   Review of Systems  Constitutional:  Negative for chills and fever.  HENT:  Negative for ear pain and sore throat.   Eyes:  Negative for pain and visual disturbance.  Respiratory:  Negative for cough and shortness of breath.   Cardiovascular:  Negative for chest pain and palpitations.  Gastrointestinal:  Positive for diarrhea, nausea and vomiting. Negative for abdominal pain.  Genitourinary:  Positive for decreased urine volume. Negative for dysuria and hematuria.  Musculoskeletal:  Negative for arthralgias and back pain.  Skin:  Negative for color change and rash.  Neurological:  Negative for seizures and syncope.  All other systems reviewed and are negative.  Physical Exam Updated Vital Signs BP (!) 82/62 (BP Location: Right Arm)    Pulse 97    Temp 97.8 F (36.6 C) (Oral)    Resp 12    Ht 5\' 7"  (1.702 m)    Wt 90.4 kg    SpO2 97%    BMI 31.20 kg/m  Physical Exam Vitals and nursing note reviewed.  Constitutional:      General: He is not  in acute distress.    Appearance: He is well-developed.  HENT:     Head: Normocephalic and atraumatic.  Eyes:     Conjunctiva/sclera: Conjunctivae normal.  Cardiovascular:     Rate and Rhythm: Normal rate and regular rhythm.     Heart sounds: No murmur heard. Pulmonary:     Effort: Pulmonary effort is normal. No respiratory distress.     Breath sounds: Normal breath sounds.  Abdominal:     Palpations: Abdomen is soft.     Tenderness: There is no abdominal tenderness.  Musculoskeletal:        General: No swelling.     Cervical back: Neck supple.  Skin:    General: Skin is warm and dry.     Capillary Refill: Capillary refill takes less than 2 seconds.   Neurological:     Mental Status: He is alert.  Psychiatric:        Mood and Affect: Mood normal.    ED Results / Procedures / Treatments   Labs (all labs ordered are listed, but only abnormal results are displayed) Labs Reviewed  CBC WITH DIFFERENTIAL/PLATELET - Abnormal; Notable for the following components:      Result Value   WBC 21.5 (*)    Neutro Abs 17.3 (*)    Monocytes Absolute 2.7 (*)    Abs Immature Granulocytes 0.12 (*)    All other components within normal limits  CULTURE, BLOOD (ROUTINE X 2)  CULTURE, BLOOD (ROUTINE X 2)  RESP PANEL BY RT-PCR (FLU A&B, COVID) ARPGX2  PROTIME-INR  COMPREHENSIVE METABOLIC PANEL  LACTIC ACID, PLASMA  LACTIC ACID, PLASMA  URINALYSIS, ROUTINE W REFLEX MICROSCOPIC    EKG None  Radiology DG Chest 2 View  Result Date: 12/08/2021 CLINICAL DATA:  Suspected Sepsis EXAM: CHEST - 2 VIEW COMPARISON:  01/20/2015 FINDINGS: The heart size and mediastinal contours are within normal limits. No focal airspace consolidation, pleural effusion, or pneumothorax. The visualized skeletal structures are unremarkable. IMPRESSION: No active cardiopulmonary disease. Electronically Signed   By: Davina Poke D.O.   On: 12/08/2021 12:27    Procedures Procedures    Medications Ordered in ED Medications  sodium chloride 0.9 % bolus 1,000 mL (has no administration in time range)  ondansetron (ZOFRAN) injection 4 mg (has no administration in time range)    ED Course/ Medical Decision Making/ A&P                           Medical Decision Making Amount and/or Complexity of Data Reviewed Labs: ordered. Radiology: ordered.  Risk Prescription drug management.   12:40 PM 46 yo male with PMH of renal transplant right side in June of 2019 at [facility] with nephrologist Dr. Janine Limbo presenting for nausea, diarrhea, fatigue, leathargy, and decreased urination secondary to dehydration.   Laboratory studies concerning for acute renal  failure with Cr 9 with recent labs demonstrating Cr of 1.7 eight days ago. Likely secondary to dehydration. 2 L IVF given. Pt has leukocytosis concerning for infection versus demarginalization from vomiting. CT abdomen pending.   Will attempt transfer to [facility] since patient received transplant there and current nephrologist is there.  Pt signed out to oncoming physician Dr. Zenia Resides while awaiting call back from [facility].        Final Clinical Impression(s) / ED Diagnoses Final diagnoses:  Acute renal failure, unspecified acute renal failure type [facility])  Renal transplant recipient  Dehydration  Diarrhea, unspecified type  Rx / DC Orders ED Discharge Orders     None         Lianne Cure, DO 39/68/86 2203

## 2021-12-08 NOTE — ED Notes (Signed)
CRITICAL VALUE STICKER  CRITICAL VALUE:Lactic acid 2.0  RECEIVER (on-site recipient of call):Shawnie Pons, RN  DATE & TIME NOTIFIED: 12-08-21 1238  MESSENGER (representative from lab):  MD NOTIFIED: Dr. Pearline Cables  TIME OF NOTIFICATION:1239  RESPONSE:

## 2021-12-08 NOTE — ED Notes (Signed)
Pt stated that he has not urinated in two days. Pt unable to use the bathroom at this time.

## 2021-12-08 NOTE — ED Provider Notes (Signed)
Patient seen by prior provider and plan was to transfer patient to [facility].  After several hours, have not received a call back to confirm transfer.  Patient and family very frustrated.  They have decided that they will leave AMA here and directly go to [facility] ER.  I encouraged him to stay but they stated they would like to go.  Blood pressure at time of discharge is stable at 111/87.  Patient is mentating appropriate at this time.  Will have AMA form signed   Lacretia Leigh, MD 12/08/21 1732

## 2021-12-13 ENCOUNTER — Emergency Department (HOSPITAL_BASED_OUTPATIENT_CLINIC_OR_DEPARTMENT_OTHER)
Admission: EM | Admit: 2021-12-13 | Discharge: 2021-12-13 | Disposition: A | Discharge status: Home / Self Care | Payer: Medicare Other | Attending: Emergency Medicine | Admitting: Emergency Medicine

## 2021-12-13 ENCOUNTER — Other Ambulatory Visit: Payer: Self-pay

## 2021-12-13 ENCOUNTER — Encounter (HOSPITAL_BASED_OUTPATIENT_CLINIC_OR_DEPARTMENT_OTHER): Payer: Self-pay

## 2021-12-13 DIAGNOSIS — Z79899 Other long term (current) drug therapy: Secondary | ICD-10-CM | POA: Diagnosis not present

## 2021-12-13 DIAGNOSIS — Y712 Prosthetic and other implants, materials and accessory cardiovascular devices associated with adverse incidents: Secondary | ICD-10-CM | POA: Insufficient documentation

## 2021-12-13 DIAGNOSIS — Z992 Dependence on renal dialysis: Secondary | ICD-10-CM | POA: Diagnosis not present

## 2021-12-13 DIAGNOSIS — T82838A Hemorrhage of vascular prosthetic devices, implants and grafts, initial encounter: Secondary | ICD-10-CM | POA: Insufficient documentation

## 2021-12-13 DIAGNOSIS — Z21 Asymptomatic human immunodeficiency virus [HIV] infection status: Secondary | ICD-10-CM | POA: Diagnosis not present

## 2021-12-13 DIAGNOSIS — I10 Essential (primary) hypertension: Secondary | ICD-10-CM | POA: Insufficient documentation

## 2021-12-13 LAB — CULTURE, BLOOD (ROUTINE X 2)
Culture: NO GROWTH
Special Requests: ADEQUATE

## 2021-12-13 NOTE — Discharge Instructions (Signed)
It was a pleasure caring for you today in the emergency department. ° °Please return to the emergency department for any worsening or worrisome symptoms. ° ° °

## 2021-12-13 NOTE — ED Triage Notes (Signed)
Patient here POV from Home with Bleeding Port Access.  Patient had Power Line Catheter placed this AM to Right Chest during Admission at [facility]. Patient was admitted for Sepsis and discharged tonight.  Patient states there were no known complications to Placement of Access this AM but noticed Bleeding at Insertion Site approximately 30 minutes PTA.  NAD Noted during Triage. A&Ox4. GCS 15. Ambulatory.

## 2021-12-13 NOTE — ED Provider Notes (Signed)
[place] EMERGENCY DEPT Provider Note   CSN: [id number] Arrival date & time: 12/13/21  2034     History  Chief Complaint  Patient presents with   Bleeding Catheter    Dylan P Prithvi Kooi. is a 46 y.o. male.  This is a 46 y.o. male  with significant medical history as below, including renal transplant, HIV, HD who presents to the ED with complaint of malfunction to picc line. Pt was discharged from [facility] earlier today, he had PICC line placed earlier today. When he got home he noticed blood on his shirt and that there was bleeding around his picc line site. He has mild discomfort to site of PICC, no dyspnea or cp, no fevers or chills, no n/v, no bleeding disorder hx  Compliant with transplant medications and f/u with them  No other complaints     Past Medical History: No date: Anemia No date: Dialysis patient [facility]) No date: Heart murmur No date: HIV disease ([place]) No date: Hypertension 06/16/2014: Lymphocytosis 03/06/2015: Noncompliance 11/25/2016: Post herpetic neuralgia     Comment:  Left V1 distribution No date: Renal disorder No date: Seizure ([place]) No date: Transplanted kidney  Past Surgical History: 05/07/2013: AV FISTULA PLACEMENT; Left     Comment:  Procedure: ARTERIOVENOUS (AV) FISTULA CREATION- LEFT;                Surgeon: Rosetta Posner, MD;  Location: [place];  Service:               Vascular;  Laterality: Left; 08/12/2013: AV FISTULA PLACEMENT; Left     Comment:  Procedure: ARTERIOVENOUS (AV) FISTULA CREATION               BRACHIOCEPHALIC;  Surgeon: Angelia Mould, MD;                Location: [place];  Service: Vascular;  Laterality: Left; No date: INSERTION OF DIALYSIS CATHETER     Comment:  x 2 No date: KIDNEY TRANSPLANT 08/12/2013: LIGATION OF ARTERIOVENOUS  FISTULA; Left     Comment:  Procedure: LIGATION OF ARTERIOVENOUS  FISTULA               RADIOCEPHALIC;  Surgeon: Angelia Mould, MD;                Location: [place];  Service:  Vascular;  Laterality: Left;    The history is provided by the patient and the spouse. No language interpreter was used.      Home Medications Prior to Admission medications   Medication Sig Start Date End Date Taking? Authorizing Provider  abacavir (ZIAGEN) 300 MG tablet TAKE 2 TABLETS BY MOUTH DAILY. STOP ZIDOVUDINE, STOP PREZCOBIX 05/12/17   Comer, Okey Regal, MD  carbamazepine (TEGRETOL) 200 MG tablet 1 tablet by mouth twice daily. Call to schedule follow-up to receive any future refills 09/15/17   Kathrynn Ducking, MD  carvedilol (COREG) 6.25 MG tablet Take 1 tablet (6.25 mg total) by mouth 2 (two) times daily. 02/07/15   Jettie Booze, MD  doxycycline (VIBRAMYCIN) 100 MG capsule Take 1 capsule (100 mg total) by mouth 2 (two) times daily. 05/08/21   Fredia Sorrow, MD  K Phos Mono-Sod Phos Di & Mono (K-PHOS-NEUTRAL) [phone] MG TABS Take by mouth. 07/30/18   [provider]  lamivudine (EPIVIR) 100 MG tablet Take 0.5 tablets (50 mg total) by mouth daily. 05/12/17   Thayer Headings, MD  mycophenolate (MYFORTIC) 180 MG EC tablet  Take by mouth. 03/01/19   [provider]  predniSONE (DELTASONE) 50 MG tablet 1 tablet PO QD X4 days 03/08/17   Ripley Fraise, MD  rilpivirine (EDURANT) 25 MG TABS tablet TAKE 1 TABLET(25 MG) BY MOUTH DAILY WITH BREAKFAST 05/12/17   Comer, Okey Regal, MD  sodium bicarbonate 650 MG tablet Take by mouth. 03/01/19   [provider]  sulfamethoxazole-trimethoprim (BACTRIM) 400-80 MG tablet Take by mouth. 02/26/19   [provider]  tacrolimus (PROGRAF) 1 MG capsule 5 mg in the am and 5 mg in the pm 03/11/19   [provider]  TIVICAY 50 MG tablet TAKE 1 TABLET(50 MG) BY MOUTH DAILY 01/10/17   Comer, Okey Regal, MD      Allergies    Codeine, Eggs or egg-derived products, Heparin, Mercury, and Tomato    Review of Systems   Review of Systems  Constitutional:  Negative for chills and fever.  HENT:  Negative for facial swelling  and trouble swallowing.   Eyes:  Negative for photophobia and visual disturbance.  Respiratory:  Negative for cough and shortness of breath.   Cardiovascular:  Negative for chest pain and palpitations.  Gastrointestinal:  Negative for abdominal pain, nausea and vomiting.  Endocrine: Negative for polydipsia and polyuria.  Genitourinary:  Negative for difficulty urinating and hematuria.  Musculoskeletal:  Negative for gait problem and joint swelling.  Skin:  Positive for wound. Negative for pallor and rash.  Neurological:  Negative for syncope and headaches.  Psychiatric/Behavioral:  Negative for agitation and confusion.    Physical Exam Updated Vital Signs BP (!) 155/116 (BP Location: Right Arm)    Pulse (!) 102    Temp 98.2 F (36.8 C) (Oral)    Resp 16    Ht 5\' 7"  (1.702 m)    Wt 90.4 kg    SpO2 100%    BMI 31.21 kg/m  Physical Exam Vitals and nursing note reviewed.  Constitutional:      General: He is not in acute distress.    Appearance: He is well-developed.  HENT:     Head: Normocephalic and atraumatic.     Right Ear: External ear normal.     Left Ear: External ear normal.     Mouth/Throat:     Mouth: Mucous membranes are moist.  Eyes:     General: No scleral icterus. Cardiovascular:     Rate and Rhythm: Normal rate and regular rhythm.     Pulses: Normal pulses.     Heart sounds: Normal heart sounds.  Pulmonary:     Effort: Pulmonary effort is normal. No respiratory distress.     Breath sounds: Normal breath sounds.  Chest:    Abdominal:     General: Abdomen is flat.     Palpations: Abdomen is soft.     Tenderness: There is no abdominal tenderness.  Musculoskeletal:        General: Normal range of motion.     Cervical back: Normal range of motion.     Right lower leg: No edema.     Left lower leg: No edema.  Skin:    General: Skin is warm and dry.     Capillary Refill: Capillary refill takes less than 2 seconds.  Neurological:     Mental Status: He is alert  and oriented to person, place, and time.  Psychiatric:        Mood and Affect: Mood normal.        Behavior: Behavior normal.  ED Results / Procedures / Treatments   Labs (all labs ordered are listed, but only abnormal results are displayed) Labs Reviewed - No data to display  EKG None  Radiology No results found.  Procedures Procedures    Medications Ordered in ED Medications - No data to display  ED Course/ Medical Decision Making/ A&P                           Medical Decision Making   CC: Bleeding PICC line  This patient presents to the Emergency Department for the above complaint. This involves an extensive number of treatment options and is a complaint that carries with it a high risk of complications and morbidity. Vital signs were reviewed. Serious etiologies considered.  Record review:  Previous records obtained and reviewed   Additional history obtained from spouse  Medical and surgical history as noted above.   Work up as above, notable for:  Cardiac monitoring reviewed and interpreted personally which shows sinus rhythm  Social determinants of health include - N/a    Patient appears to have achieved hemostasis of his PICC line.  Dressing was changed at bedside by nursing staff using sterile technique.  No ongoing bleeding to the PICC line.  PICC line is functioning properly.  No cellulitic findings associated with the PICC line.  Patient reports that nurses coming to the house tomorrow for dressing change.  Patient is hemodynamically stable.  No thinners.  No history of bleeding disorder.  PICC line working properly.  Dressing change at bedside.  No further bleeding.  The patient improved significantly and was discharged in stable condition. Detailed discussions were had with the patient regarding current findings, and need for close f/u with PCP or on call doctor. The patient has been instructed to return immediately if the symptoms worsen in any way  for re-evaluation. Patient verbalized understanding and is in agreement with current care plan. All questions answered prior to discharge.      This chart was dictated using voice recognition software.  Despite best efforts to proofread,  errors can occur which can change the documentation meaning.         Final Clinical Impression(s) / ED Diagnoses Final diagnoses:  Bleeding from PICC line, initial encounter [facility])    Rx / DC Orders ED Discharge Orders     None         Jeanell Sparrow, DO 12/14/21 2002

## 2022-01-10 ENCOUNTER — Emergency Department (HOSPITAL_COMMUNITY)
Admission: EM | Admit: 2022-01-10 | Discharge: 2022-01-10 | Discharge status: Against Medical Advice | Payer: Medicare Other | Source: Home / Self Care

## 2024-01-26 ENCOUNTER — Encounter (HOSPITAL_BASED_OUTPATIENT_CLINIC_OR_DEPARTMENT_OTHER): Payer: Self-pay | Admitting: Emergency Medicine

## 2024-01-26 ENCOUNTER — Emergency Department (HOSPITAL_BASED_OUTPATIENT_CLINIC_OR_DEPARTMENT_OTHER)
Admission: EM | Admit: 2024-01-26 | Discharge: 2024-01-26 | Disposition: A | Discharge status: Home / Self Care | Attending: Emergency Medicine | Admitting: Emergency Medicine

## 2024-01-26 ENCOUNTER — Other Ambulatory Visit: Payer: Self-pay

## 2024-01-26 ENCOUNTER — Emergency Department (HOSPITAL_BASED_OUTPATIENT_CLINIC_OR_DEPARTMENT_OTHER)

## 2024-01-26 DIAGNOSIS — I120 Hypertensive chronic kidney disease with stage 5 chronic kidney disease or end stage renal disease: Secondary | ICD-10-CM | POA: Insufficient documentation

## 2024-01-26 DIAGNOSIS — Z21 Asymptomatic human immunodeficiency virus [HIV] infection status: Secondary | ICD-10-CM | POA: Insufficient documentation

## 2024-01-26 DIAGNOSIS — Z992 Dependence on renal dialysis: Secondary | ICD-10-CM | POA: Insufficient documentation

## 2024-01-26 DIAGNOSIS — Z5329 Procedure and treatment not carried out because of patient's decision for other reasons: Secondary | ICD-10-CM | POA: Insufficient documentation

## 2024-01-26 DIAGNOSIS — J0120 Acute ethmoidal sinusitis, unspecified: Secondary | ICD-10-CM | POA: Insufficient documentation

## 2024-01-26 DIAGNOSIS — R519 Headache, unspecified: Secondary | ICD-10-CM | POA: Insufficient documentation

## 2024-01-26 DIAGNOSIS — Z79899 Other long term (current) drug therapy: Secondary | ICD-10-CM | POA: Insufficient documentation

## 2024-01-26 DIAGNOSIS — N186 End stage renal disease: Secondary | ICD-10-CM | POA: Diagnosis not present

## 2024-01-26 LAB — CBC
HCT: 39.8 % (ref 39.0–52.0)
Hemoglobin: 13.4 g/dL (ref 13.0–17.0)
MCH: 32 pg (ref 26.0–34.0)
MCHC: 33.7 g/dL (ref 30.0–36.0)
MCV: 95 fL (ref 80.0–100.0)
Platelets: 204 10*3/uL (ref 150–400)
RBC: 4.19 MIL/uL — ABNORMAL LOW (ref 4.22–5.81)
RDW: 13.3 % (ref 11.5–15.5)
WBC: 12.6 10*3/uL — ABNORMAL HIGH (ref 4.0–10.5)
nRBC: 0 % (ref 0.0–0.2)

## 2024-01-26 LAB — COMPREHENSIVE METABOLIC PANEL
ALT: 43 U/L (ref 0–44)
AST: 21 U/L (ref 15–41)
Albumin: 4.2 g/dL (ref 3.5–5.0)
Alkaline Phosphatase: 51 U/L (ref 38–126)
Anion gap: 7 (ref 5–15)
BUN: 18 mg/dL (ref 6–20)
CO2: 23 mmol/L (ref 22–32)
Calcium: 9.3 mg/dL (ref 8.9–10.3)
Chloride: 108 mmol/L (ref 98–111)
Creatinine, Ser: 1.6 mg/dL — ABNORMAL HIGH (ref 0.61–1.24)
GFR, Estimated: 52 mL/min — ABNORMAL LOW (ref >60)
Glucose, Bld: 151 mg/dL — ABNORMAL HIGH (ref 70–99)
Potassium: 4.2 mmol/L (ref 3.5–5.1)
Sodium: 138 mmol/L (ref 135–145)
Total Bilirubin: 0.5 mg/dL (ref 0.0–1.2)
Total Protein: 7 g/dL (ref 6.5–8.1)

## 2024-01-26 MED ORDER — TETRACAINE HCL 0.5 % OP SOLN
2.0000 [drp] | Freq: Once | OPHTHALMIC | Status: AC
  Administered 2024-01-26: 2 [drp] via OPHTHALMIC
  Filled 2024-01-26: qty 4

## 2024-01-26 MED ORDER — DIPHENHYDRAMINE HCL 50 MG/ML IJ SOLN
25.0000 mg | Freq: Once | INTRAMUSCULAR | Status: AC
  Administered 2024-01-26: 25 mg via INTRAVENOUS
  Filled 2024-01-26: qty 1

## 2024-01-26 MED ORDER — IOPAMIDOL (ISOVUE-370) INJECTION 76%: 75.0000 mL | Freq: Once | INTRAVENOUS | Status: DC | PRN

## 2024-01-26 MED ORDER — LACTATED RINGERS IV BOLUS
1000.0000 mL | Freq: Once | INTRAVENOUS | Status: AC
  Administered 2024-01-26: 1000 mL via INTRAVENOUS

## 2024-01-26 MED ORDER — PROCHLORPERAZINE EDISYLATE 10 MG/2ML IJ SOLN
10.0000 mg | Freq: Once | INTRAMUSCULAR | Status: AC
Start: 2024-01-26 — End: 2024-01-26
  Administered 2024-01-26: 10 mg via INTRAVENOUS
  Filled 2024-01-26: qty 2

## 2024-01-26 MED ORDER — IOHEXOL 350 MG/ML SOLN
75.0000 mL | Freq: Once | INTRAVENOUS | Status: AC | PRN
Start: 2024-01-26 — End: 2024-01-26
  Administered 2024-01-26: 75 mL via INTRAVENOUS

## 2024-01-26 MED ORDER — AMLODIPINE BESYLATE 5 MG PO TABS
10.0000 mg | ORAL_TABLET | Freq: Once | ORAL | Status: AC
  Administered 2024-01-26: 10 mg via ORAL
  Filled 2024-01-26: qty 2

## 2024-01-26 NOTE — ED Provider Notes (Signed)
 [place] EMERGENCY DEPARTMENT AT [facility] Provider Note   CSN: [id number] Arrival date & time: 01/26/24  1741     History {Add pertinent medical, surgical, social history, OB history to HPI:1} Chief Complaint  Patient presents with   Headache    Dylan Barber. is a 48 y.o. male.  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    Home Medications Prior to Admission medications   Medication Sig Start Date End Date Taking? Authorizing Provider  abacavir (ZIAGEN) 300 MG tablet TAKE 2 TABLETS BY MOUTH DAILY. STOP ZIDOVUDINE, STOP PREZCOBIX 05/12/17   Comer, Belia Heman, MD  carbamazepine (TEGRETOL) 200 MG tablet 1 tablet by mouth twice daily. Call to schedule follow-up to receive any future refills 09/15/17   York Spaniel, MD  carvedilol (COREG) 6.25 MG tablet Take 1 tablet (6.25 mg total) by mouth 2 (two) times daily. 02/07/15   Corky Crafts, MD  doxycycline (VIBRAMYCIN) 100 MG capsule Take 1 capsule (100 mg total) by mouth 2 (two) times daily. 05/08/21   Vanetta Mulders, MD  K Phos Mono-Sod Phos Di & Mono (K-PHOS-NEUTRAL) [phone] MG TABS Take by mouth. 07/30/18   [provider]  lamivudine (EPIVIR) 100 MG tablet Take 0.5 tablets (50 mg total) by mouth daily. 05/12/17   Gardiner Barefoot, MD  mycophenolate (MYFORTIC) 180 MG EC tablet Take by mouth. 03/01/19   [provider]  predniSONE (DELTASONE) 50 MG tablet 1 tablet PO QD X4 days 03/08/17   Zadie Rhine, MD  rilpivirine  (EDURANT) 25 MG TABS tablet TAKE 1 TABLET(25 MG) BY MOUTH DAILY WITH BREAKFAST 05/12/17   Comer, Belia Heman, MD  sodium bicarbonate 650 MG tablet Take by mouth. 03/01/19   [provider]  sulfamethoxazole-trimethoprim (BACTRIM) 400-80 MG tablet Take by mouth. 02/26/19   [provider]  tacrolimus (PROGRAF) 1 MG capsule 5 mg in the am and 5 mg in the pm 03/11/19   [provider]  TIVICAY 50 MG tablet TAKE 1 TABLET(50 MG) BY MOUTH DAILY 01/10/17   Comer, Belia Heman, MD      Allergies    Codeine, Egg-derived products, Heparin, Mercury, and Tomato    Review of Systems   Review of Systems  Physical Exam Updated Vital Signs BP (!) 163/100 (BP Location: Right Arm)   Pulse (!) 111   Temp 97.8 F (36.6 C)   Resp 16   SpO2 98%  Physical Exam  ED Results / Procedures / Treatments   Labs (all labs ordered are listed, but only abnormal results are displayed) Labs Reviewed  COMPREHENSIVE METABOLIC PANEL  CBC  URINALYSIS, ROUTINE W REFLEX MICROSCOPIC    EKG None  Radiology No results found.  Procedures Procedures  {Document cardiac monitor, telemetry assessment procedure when appropriate:1}  Medications Ordered in ED Medications - No data to display  ED Course/ Medical Decision Making/ A&P   {   Click here for ABCD2, HEART and other calculatorsREFRESH Note before signing :1}  Medical Decision Making Amount and/or Complexity of Data Reviewed Labs: ordered.   ***  {Document critical care time when appropriate:1} {Document review of labs and clinical decision tools ie heart score, Chads2Vasc2 etc:1}  {Document your independent review of radiology images, and any outside records:1} {Document your discussion with family members, caretakers, and with consultants:1} {Document social determinants of health affecting pt's care:1} {Document your decision making why or why not admission, treatments were needed:1} Final Clinical  Impression(s) / ED Diagnoses Final diagnoses:  None    Rx / DC Orders ED Discharge Orders     None

## 2024-01-26 NOTE — ED Triage Notes (Signed)
 Pt endorses RT side HA behind RT eye and RT side forehead with photo sensitivity x 3 days. Taking tylenol with no relief. Denies fever or cough

## 2024-01-26 NOTE — ED Notes (Signed)
-  Called [facility] line at 720pm for consult with renal transplant per EDP.

## 2024-01-26 NOTE — ED Notes (Signed)
 Pt leaving AMA due to having to go to work. [place] MD and this RN encouraged pt to stay, pt still wanting to leave. AMA form signed, pt ambulatory upon departure.

## 2024-01-27 ENCOUNTER — Telehealth (HOSPITAL_BASED_OUTPATIENT_CLINIC_OR_DEPARTMENT_OTHER): Payer: Self-pay | Admitting: Emergency Medicine

## 2024-01-27 MED ORDER — AMOXICILLIN-POT CLAVULANATE 500-125 MG PO TABS: 1.0000 | ORAL_TABLET | Freq: Two times a day (BID) | ORAL | 0 refills | Status: AC

## 2024-01-27 NOTE — Telephone Encounter (Signed)
 Discussed with husband CT without bleed or aneurysm, does show right sided sinusitis. Sent rx for augmentin for sinusitis. Will need follow up of renal function, myfortic with PCP or nephrology.

## 2024-04-07 ENCOUNTER — Encounter (HOSPITAL_BASED_OUTPATIENT_CLINIC_OR_DEPARTMENT_OTHER): Payer: Self-pay | Admitting: *Deleted

## 2024-04-07 ENCOUNTER — Inpatient Hospital Stay (HOSPITAL_BASED_OUTPATIENT_CLINIC_OR_DEPARTMENT_OTHER)
Admission: EM | Admit: 2024-04-07 | Discharge: 2024-04-10 | DRG: 372 | Disposition: A | Discharge status: Home / Self Care | Attending: Internal Medicine | Admitting: Internal Medicine

## 2024-04-07 ENCOUNTER — Emergency Department (HOSPITAL_BASED_OUTPATIENT_CLINIC_OR_DEPARTMENT_OTHER)

## 2024-04-07 ENCOUNTER — Other Ambulatory Visit: Payer: Self-pay

## 2024-04-07 DIAGNOSIS — T8619 Other complication of kidney transplant: Secondary | ICD-10-CM | POA: Diagnosis present

## 2024-04-07 DIAGNOSIS — Z79621 Long term (current) use of calcineurin inhibitor: Secondary | ICD-10-CM

## 2024-04-07 DIAGNOSIS — A042 Enteroinvasive Escherichia coli infection: Secondary | ICD-10-CM | POA: Diagnosis present

## 2024-04-07 DIAGNOSIS — D84821 Immunodeficiency due to drugs: Secondary | ICD-10-CM | POA: Diagnosis present

## 2024-04-07 DIAGNOSIS — Z888 Allergy status to other drugs, medicaments and biological substances status: Secondary | ICD-10-CM

## 2024-04-07 DIAGNOSIS — Z79899 Other long term (current) drug therapy: Secondary | ICD-10-CM | POA: Diagnosis not present

## 2024-04-07 DIAGNOSIS — G629 Polyneuropathy, unspecified: Secondary | ICD-10-CM | POA: Diagnosis present

## 2024-04-07 DIAGNOSIS — Z940 Kidney transplant status: Secondary | ICD-10-CM | POA: Diagnosis not present

## 2024-04-07 DIAGNOSIS — N1831 Chronic kidney disease, stage 3a: Secondary | ICD-10-CM | POA: Diagnosis present

## 2024-04-07 DIAGNOSIS — Z8616 Personal history of COVID-19: Secondary | ICD-10-CM | POA: Diagnosis not present

## 2024-04-07 DIAGNOSIS — N179 Acute kidney failure, unspecified: Secondary | ICD-10-CM | POA: Diagnosis not present

## 2024-04-07 DIAGNOSIS — B20 Human immunodeficiency virus [HIV] disease: Secondary | ICD-10-CM | POA: Diagnosis present

## 2024-04-07 DIAGNOSIS — N29 Other disorders of kidney and ureter in diseases classified elsewhere: Secondary | ICD-10-CM | POA: Diagnosis present

## 2024-04-07 DIAGNOSIS — N189 Chronic kidney disease, unspecified: Secondary | ICD-10-CM | POA: Diagnosis not present

## 2024-04-07 DIAGNOSIS — Y830 Surgical operation with transplant of whole organ as the cause of abnormal reaction of the patient, or of later complication, without mention of misadventure at the time of the procedure: Secondary | ICD-10-CM | POA: Diagnosis present

## 2024-04-07 DIAGNOSIS — Z83719 Family history of colon polyps, unspecified: Secondary | ICD-10-CM | POA: Diagnosis not present

## 2024-04-07 DIAGNOSIS — E8721 Acute metabolic acidosis: Secondary | ICD-10-CM | POA: Diagnosis present

## 2024-04-07 DIAGNOSIS — E860 Dehydration: Secondary | ICD-10-CM | POA: Diagnosis present

## 2024-04-07 DIAGNOSIS — I129 Hypertensive chronic kidney disease with stage 1 through stage 4 chronic kidney disease, or unspecified chronic kidney disease: Secondary | ICD-10-CM | POA: Diagnosis present

## 2024-04-07 DIAGNOSIS — B181 Chronic viral hepatitis B without delta-agent: Secondary | ICD-10-CM | POA: Diagnosis not present

## 2024-04-07 DIAGNOSIS — E8722 Chronic metabolic acidosis: Secondary | ICD-10-CM | POA: Diagnosis present

## 2024-04-07 DIAGNOSIS — Z8619 Personal history of other infectious and parasitic diseases: Secondary | ICD-10-CM

## 2024-04-07 DIAGNOSIS — R109 Unspecified abdominal pain: Secondary | ICD-10-CM | POA: Diagnosis present

## 2024-04-07 DIAGNOSIS — Z885 Allergy status to narcotic agent status: Secondary | ICD-10-CM

## 2024-04-07 DIAGNOSIS — Z841 Family history of disorders of kidney and ureter: Secondary | ICD-10-CM | POA: Diagnosis not present

## 2024-04-07 DIAGNOSIS — K529 Noninfective gastroenteritis and colitis, unspecified: Principal | ICD-10-CM | POA: Diagnosis present

## 2024-04-07 HISTORY — DX: End stage renal disease: N18.6

## 2024-04-07 LAB — CBC
HCT: 40 % (ref 39.0–52.0)
Hemoglobin: 13.5 g/dL (ref 13.0–17.0)
MCH: 32.3 pg (ref 26.0–34.0)
MCHC: 33.8 g/dL (ref 30.0–36.0)
MCV: 95.7 fL (ref 80.0–100.0)
Platelets: 176 10*3/uL (ref 150–400)
RBC: 4.18 MIL/uL — ABNORMAL LOW (ref 4.22–5.81)
RDW: 13 % (ref 11.5–15.5)
WBC: 13.9 10*3/uL — ABNORMAL HIGH (ref 4.0–10.5)
nRBC: 0 % (ref 0.0–0.2)

## 2024-04-07 LAB — URINALYSIS, ROUTINE W REFLEX MICROSCOPIC
Bacteria, UA: NONE SEEN
Bilirubin Urine: NEGATIVE
Glucose, UA: NEGATIVE mg/dL
Hgb urine dipstick: NEGATIVE
Ketones, ur: NEGATIVE mg/dL
Leukocytes,Ua: NEGATIVE
Nitrite: NEGATIVE
Protein, ur: NEGATIVE mg/dL
Specific Gravity, Urine: 1.016 (ref 1.005–1.030)
pH: 5 (ref 5.0–8.0)

## 2024-04-07 LAB — RESP PANEL BY RT-PCR (RSV, FLU A&B, COVID)  RVPGX2
Influenza A by PCR: NEGATIVE
Influenza B by PCR: NEGATIVE
Resp Syncytial Virus by PCR: NEGATIVE
SARS Coronavirus 2 by RT PCR: NEGATIVE

## 2024-04-07 LAB — COMPREHENSIVE METABOLIC PANEL WITH GFR
ALT: 23 U/L (ref 0–44)
AST: 18 U/L (ref 15–41)
Albumin: 4.2 g/dL (ref 3.5–5.0)
Alkaline Phosphatase: 56 U/L (ref 38–126)
Anion gap: 13 (ref 5–15)
BUN: 18 mg/dL (ref 6–20)
CO2: 17 mmol/L — ABNORMAL LOW (ref 22–32)
Calcium: 9.6 mg/dL (ref 8.9–10.3)
Chloride: 109 mmol/L (ref 98–111)
Creatinine, Ser: 1.92 mg/dL — ABNORMAL HIGH (ref 0.61–1.24)
GFR, Estimated: 42 mL/min — ABNORMAL LOW (ref >60)
Glucose, Bld: 162 mg/dL — ABNORMAL HIGH (ref 70–99)
Potassium: 4.1 mmol/L (ref 3.5–5.1)
Sodium: 139 mmol/L (ref 135–145)
Total Bilirubin: 0.5 mg/dL (ref 0.0–1.2)
Total Protein: 6.9 g/dL (ref 6.5–8.1)

## 2024-04-07 LAB — LIPASE, BLOOD: Lipase: 44 U/L (ref 11–51)

## 2024-04-07 MED ORDER — ACETAMINOPHEN 325 MG PO TABS
650.0000 mg | ORAL_TABLET | Freq: Four times a day (QID) | ORAL | Status: DC | PRN
  Administered 2024-04-08: 650 mg via ORAL
  Filled 2024-04-07: qty 2

## 2024-04-07 MED ORDER — SODIUM CHLORIDE 0.9 % IV SOLN
INTRAVENOUS | Status: AC
Start: 2024-04-07 — End: 2024-04-08

## 2024-04-07 MED ORDER — ACETAMINOPHEN 500 MG PO TABS
1000.0000 mg | ORAL_TABLET | Freq: Once | ORAL | Status: AC
  Administered 2024-04-07: 1000 mg via ORAL
  Filled 2024-04-07: qty 2

## 2024-04-07 MED ORDER — FENTANYL CITRATE PF 50 MCG/ML IJ SOSY
50.0000 ug | PREFILLED_SYRINGE | Freq: Once | INTRAMUSCULAR | Status: AC
  Administered 2024-04-07: 50 ug via INTRAVENOUS
  Filled 2024-04-07: qty 1

## 2024-04-07 MED ORDER — CARVEDILOL 6.25 MG PO TABS: 6.2500 mg | ORAL_TABLET | Freq: Two times a day (BID) | ORAL | Status: DC

## 2024-04-07 MED ORDER — CARVEDILOL 25 MG PO TABS
25.0000 mg | ORAL_TABLET | Freq: Two times a day (BID) | ORAL | Status: DC
  Administered 2024-04-07 – 2024-04-08 (×2): 25 mg via ORAL
  Filled 2024-04-07 (×2): qty 1

## 2024-04-07 MED ORDER — LAMIVUDINE 100 MG PO TABS: 50.0000 mg | ORAL_TABLET | Freq: Every day | ORAL | Status: DC

## 2024-04-07 MED ORDER — TACROLIMUS 1 MG PO CAPS
5.0000 mg | ORAL_CAPSULE | Freq: Two times a day (BID) | ORAL | Status: DC
  Administered 2024-04-07 – 2024-04-10 (×7): 5 mg via ORAL
  Filled 2024-04-07 (×7): qty 5

## 2024-04-07 MED ORDER — ONDANSETRON HCL 4 MG/2ML IJ SOLN: 4.0000 mg | Freq: Four times a day (QID) | INTRAMUSCULAR | Status: DC | PRN

## 2024-04-07 MED ORDER — MORPHINE SULFATE (PF) 4 MG/ML IV SOLN
4.0000 mg | Freq: Once | INTRAVENOUS | Status: AC
  Administered 2024-04-07: 4 mg via INTRAVENOUS
  Filled 2024-04-07: qty 1

## 2024-04-07 MED ORDER — ONDANSETRON HCL 4 MG PO TABS: 4.0000 mg | ORAL_TABLET | Freq: Four times a day (QID) | ORAL | Status: DC | PRN

## 2024-04-07 MED ORDER — TRAZODONE HCL 50 MG PO TABS: 25.0000 mg | ORAL_TABLET | Freq: Every evening | ORAL | Status: DC | PRN

## 2024-04-07 MED ORDER — ACETAMINOPHEN 650 MG RE SUPP: 650.0000 mg | Freq: Four times a day (QID) | RECTAL | Status: DC | PRN

## 2024-04-07 MED ORDER — GABAPENTIN 300 MG PO CAPS: 300.0000 mg | ORAL_CAPSULE | Freq: Every day | ORAL | Status: DC

## 2024-04-07 MED ORDER — ORAL CARE MOUTH RINSE: 15.0000 mL | OROMUCOSAL | Status: DC | PRN

## 2024-04-07 MED ORDER — LACTATED RINGERS IV BOLUS
1000.0000 mL | Freq: Once | INTRAVENOUS | Status: AC
  Administered 2024-04-07: 1000 mL via INTRAVENOUS

## 2024-04-07 MED ORDER — MYCOPHENOLATE SODIUM 180 MG PO TBEC
720.0000 mg | DELAYED_RELEASE_TABLET | Freq: Two times a day (BID) | ORAL | Status: DC
  Administered 2024-04-07 – 2024-04-10 (×7): 720 mg via ORAL
  Filled 2024-04-07 (×7): qty 4

## 2024-04-07 MED ORDER — SODIUM CHLORIDE 0.9 % IV BOLUS
1000.0000 mL | Freq: Once | INTRAVENOUS | Status: AC
  Administered 2024-04-07: 1000 mL via INTRAVENOUS

## 2024-04-07 MED ORDER — ABACAVIR SULFATE 300 MG PO TABS: 600.0000 mg | ORAL_TABLET | Freq: Every day | ORAL | Status: DC

## 2024-04-07 MED ORDER — PREDNISONE 5 MG PO TABS
5.0000 mg | ORAL_TABLET | Freq: Every day | ORAL | Status: DC
  Administered 2024-04-08 – 2024-04-10 (×3): 5 mg via ORAL
  Filled 2024-04-07 (×3): qty 1

## 2024-04-07 MED ORDER — BICTEGRAVIR-EMTRICITAB-TENOFOV 50-200-25 MG PO TABS
1.0000 | ORAL_TABLET | Freq: Every day | ORAL | Status: DC
  Administered 2024-04-07 – 2024-04-09 (×3): 1 via ORAL
  Filled 2024-04-07 (×3): qty 1

## 2024-04-07 MED ORDER — OXYCODONE HCL 5 MG PO TABS: 5.0000 mg | ORAL_TABLET | ORAL | Status: DC | PRN

## 2024-04-07 MED ORDER — GABAPENTIN 100 MG PO CAPS: 100.0000 mg | ORAL_CAPSULE | Freq: Three times a day (TID) | ORAL | Status: DC | PRN

## 2024-04-07 MED ORDER — HYDROMORPHONE HCL 1 MG/ML IJ SOLN
0.5000 mg | INTRAMUSCULAR | Status: DC | PRN
  Administered 2024-04-07: 1 mg via INTRAVENOUS
  Administered 2024-04-07: 0.5 mg via INTRAVENOUS
  Administered 2024-04-07 – 2024-04-09 (×7): 1 mg via INTRAVENOUS
  Filled 2024-04-07 (×9): qty 1

## 2024-04-07 MED ORDER — SULFAMETHOXAZOLE-TRIMETHOPRIM 400-80 MG PO TABS
1.0000 | ORAL_TABLET | ORAL | Status: DC
  Administered 2024-04-07 – 2024-04-09 (×2): 1 via ORAL
  Filled 2024-04-07 (×2): qty 1

## 2024-04-07 MED ORDER — ALBUTEROL SULFATE (2.5 MG/3ML) 0.083% IN NEBU
2.5000 mg | INHALATION_SOLUTION | RESPIRATORY_TRACT | Status: DC | PRN
Start: 2024-04-07 — End: 2024-04-10

## 2024-04-07 MED ORDER — SODIUM BICARBONATE 650 MG PO TABS
650.0000 mg | ORAL_TABLET | Freq: Every day | ORAL | Status: DC
  Administered 2024-04-07 – 2024-04-10 (×4): 650 mg via ORAL
  Filled 2024-04-07 (×4): qty 1

## 2024-04-07 MED ORDER — LORATADINE 10 MG PO TABS
10.0000 mg | ORAL_TABLET | Freq: Every day | ORAL | Status: DC
  Administered 2024-04-07 – 2024-04-10 (×4): 10 mg via ORAL
  Filled 2024-04-07 (×4): qty 1

## 2024-04-07 MED ORDER — ONDANSETRON HCL 4 MG/2ML IJ SOLN
4.0000 mg | Freq: Once | INTRAMUSCULAR | Status: AC
  Administered 2024-04-07: 4 mg via INTRAVENOUS
  Filled 2024-04-07: qty 2

## 2024-04-07 NOTE — ED Notes (Signed)
 Patient states he would like to discuss with his partner about admission to [facility] before signing consent to transport with [facility].

## 2024-04-07 NOTE — ED Notes (Signed)
Dylan Barber with cl called for transport

## 2024-04-07 NOTE — ED Provider Notes (Signed)
  EMERGENCY DEPARTMENT AT [facility] Provider Note   CSN: [id number] Arrival date & time: 04/07/24  0546     History  Chief Complaint  Patient presents with   Abdominal Pain    Dylan Barber. is a 48 y.o. male.  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  Patient presenting today with complaints of nausea, vomiting, and diarrhea.  This started yesterday evening and has been persistent through the night.  He does report urinating just prior to arriva  He describes generalized abdominal cramping, but no localized pain.  He reports average are 101 at home.  No ill contacts and denies having consumed any undercooked or suspicious foods.       Home Medications Prior to Admission medications   Medication Sig Start Date End Date Taking? Authorizing Provider  abacavir  (ZIAGEN ) 300 MG tablet TAKE 2 TABLETS BY MOUTH DAILY. STOP ZIDOVUDINE , STOP PREZCOBIX  05/12/17   Comer, Judithann Novas, MD  carbamazepine  (TEGRETOL ) 200 MG tablet 1 tablet by mouth twice daily. Call to schedule follow-up to receive any future refills 09/15/17   Brian Campanile, MD  carvedilol  (COREG ) 6.25 MG tablet Take 1 tablet (6.25 mg total) by mouth 2 (two) times daily. 02/07/15   Lucendia Rusk, MD  doxycycline  (VIBRAMYCIN ) 100 MG capsule Take 1 capsule (100 mg total) by mouth 2 (two) times daily. 05/08/21   Zackowski, Scott, MD  K Phos Mono-Sod Phos Di & Mono (K-PHOS-NEUTRAL) 155-852-130 MG TABS Take by mouth. 07/30/18   [provider]  lamivudine  (EPIVIR ) 100 MG tablet Take 0.5 tablets (50 mg total) by mouth daily. 05/12/17   Lina Render, MD  mycophenolate (MYFORTIC) 180 MG EC tablet Take by mouth. 03/01/19   [provider]  predniSONE  (DELTASONE ) 50 MG tablet 1 tablet PO QD X4 days 03/08/17   Eldon Greenland, MD  rilpivirine  (EDURANT) 25 MG TABS tablet TAKE 1 TABLET(25 MG) BY MOUTH  DAILY WITH BREAKFAST 05/12/17   Comer, Judithann Novas, MD  sodium bicarbonate 650 MG tablet Take by mouth. 03/01/19   [provider]  sulfamethoxazole -trimethoprim  (BACTRIM ) 400-80 MG tablet Take by mouth. 02/26/19   [provider]  tacrolimus  (PROGRAF ) 1 MG capsule 5 mg in the am and 5 mg in the pm 03/11/19   [provider]  TIVICAY  50 MG tablet TAKE 1 TABLET(50 MG) BY MOUTH DAILY 01/10/17   Comer, Judithann Novas, MD      Allergies    Codeine, Egg-derived products, Heparin , Mercury, and Tomato    Review of Systems   Review of Systems  All other systems reviewed and are negative.   Physical Exam Updated Vital Signs BP (!) 162/107   Pulse (!) 117   Temp 100.3 F (37.9 C) (Oral)   Resp 16   SpO2 98%  Physical Exam Vitals and nursing note reviewed.  Constitutional:      General: He is not in acute distress.    Appearance: He is well-developed. He is not diaphoretic.  HENT:     Head: Normocephalic and atraumatic.  Cardiovascular:     Rate and Rhythm: Normal rate and regular rhythm.     Heart sounds: No murmur heard.    No friction rub.  Pulmonary:     Effort: Pulmonary effort is normal. No respiratory distress.     Breath sounds: Normal breath sounds. No wheezing or rales.  Abdominal:  General: Bowel sounds are normal. There is no distension.     Palpations: Abdomen is soft.     Tenderness: There is no abdominal tenderness.  Musculoskeletal:        General: Normal range of motion.     Cervical back: Normal range of motion and neck supple.  Skin:    General: Skin is warm and dry.  Neurological:     Mental Status: He is alert and oriented to person, place, and time.     Coordination: Coordination normal.     ED Results / Procedures / Treatments   Labs (all labs ordered are listed, but only abnormal results are displayed) Labs Reviewed  CBC - Abnormal; Notable for the following components:      Result Value   WBC 13.9 (*)    RBC 4.18 (*)    All other  components within normal limits  LIPASE, BLOOD  COMPREHENSIVE METABOLIC PANEL WITH GFR  URINALYSIS, ROUTINE W REFLEX MICROSCOPIC    EKG EKG Interpretation Date/Time:  Wednesday Apr 07 2024 06:30:08 EDT Ventricular Rate:  119 PR Interval:  168 QRS Duration:  74 QT Interval:  281 QTC Calculation: 396 R Axis:   36  Text Interpretation: Sinus tachycardia Consider right atrial enlargement Low voltage, precordial leads Borderline T abnormalities, inferior leads Confirmed by Orvilla Blander ([id number]) on 04/07/2024 6:33:47 AM  Radiology No results found.  Procedures Procedures  {Document cardiac monitor, telemetry assessment procedure when appropriate:1}  Medications Ordered in ED Medications  ondansetron  (ZOFRAN ) injection 4 mg (has no administration in time range)  sodium chloride  0.9 % bolus 1,000 mL (has no administration in time range)    ED Course/ Medical Decision Making/ A&P   {   Click here for ABCD2, HEART and other calculatorsREFRESH Note before signing :1}                              Medical Decision Making Amount and/or Complexity of Data Reviewed Labs: ordered.  Risk Prescription drug management.   ***  {Document critical care time when appropriate:1} {Document review of labs and clinical decision tools ie heart score, Chads2Vasc2 etc:1}  {Document your independent review of radiology images, and any outside records:1} {Document your discussion with family members, caretakers, and with consultants:1} {Document social determinants of health affecting pt's care:1} {Document your decision making why or why not admission, treatments were needed:1} Final Clinical Impression(s) / ED Diagnoses Final diagnoses:  None    Rx / DC Orders ED Discharge Orders     None

## 2024-04-07 NOTE — ED Notes (Signed)
 ED Provider at bedside.

## 2024-04-07 NOTE — ED Provider Notes (Signed)
 Physical Exam  BP (!) 155/102   Pulse (!) 109   Temp 100.3 F (37.9 C) (Oral)   Resp (!) 21   SpO2 95%   Physical Exam  Procedures  .Critical Care  Performed by: Rosealee Concha, MD Authorized by: Rosealee Concha, MD   Critical care provider statement:    Critical care time (minutes):  30   Critical care was necessary to treat or prevent imminent or life-threatening deterioration of the following conditions:  Dehydration   Critical care was time spent personally by me on the following activities:  Development of treatment plan with patient or surrogate, discussions with consultants, evaluation of patient's response to treatment, examination of patient, ordering and review of laboratory studies, ordering and review of radiographic studies, ordering and performing treatments and interventions, pulse oximetry, re-evaluation of patient's condition and review of old charts   Care discussed with: admitting provider     ED Course / MDM    Medical Decision Making Amount and/or Complexity of Data Reviewed Labs: ordered. Radiology: ordered.  Risk OTC drugs. Prescription drug management. Decision regarding hospitalization.   48M hx of polycystic kidney disease s/p renal transplant, having vomiting and diarrhea. Abdomen non tender, Pt needs IVF resuscitation and reassessment.   48 y.o. 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    The patient has been unable to tolerate oral intake.  He feels significantly dehydrated.  He endorses increasing thirst.  He has had 6 episodes of vomiting in the past 24 hours and around 10 episodes of watery diarrhea.  He has been compliant with all of his immunosuppressive medications, follows with [facility] nephrology since his kidney transplant in 2019.  He had generalized abdominal pain earlier today that improved somewhat with morphine .  After 1 L IV fluid bolus, the patient is still tachycardic to the 130s on cardiac telemetry.  His mucous membranes are dry.  His temperature was borderline febrile to 100.3, laboratory evaluation concerning for leukocytosis to 13.9 raising concern for developing sepsis, the patient had an acidosis with a bicarbonate of 17, worsening renal function with a creatinine of 1.92 from the last measurement of 1.6 two months ago. Lipase was normal. UA has not been collected.  CT Abd Pel:  IMPRESSION:  1. Wall thickening and pericolonic fat stranding involving the  proximal colon, consistent with inflammatory or infectious colitis.  2. Scattered gas fluid levels throughout the large and small bowel  consistent with diarrheal illness. No obstruction or ileus.  3. Unremarkable right lower quadrant transplant kidney.  4. Small hiatal hernia.    UA: Negative for hematuria and UTI.  The patient was persistently tachycardic with heart rates in the 130s.  He did spike a fever to 100.8 and was administered Tylenol .  Despite 2 L of fluid resuscitation, the patient had persistent unstable vitals with elevated heart rates in the 130s and due to this, concern for critical dehydration in the setting of likely gastroenteritis with associated inflammatory infectious colitis.  I recommended inpatient hospitalization for further management, hospitalist  medicine consulted for admission, Dr. Lowell Rude accepting.      Rosealee Concha,  MD 04/07/24 1038

## 2024-04-07 NOTE — ED Notes (Signed)
Pt unable to provide UA.

## 2024-04-07 NOTE — H&P (Signed)
 History and Physical  Dylan Barber Dylan Barber. EXB:[id number] DOB: [birth year] DOA: 04/07/2024  PCP: Jefrey Mink I, MD   Chief Complaint: Abdominal pain, vomiting, diarrhea  HPI: Dylan Barber. is a 48 y.o. male with medical history significant for HTN, seizure, HIV disease, renal transplant 2019 being admitted to the hospital with viral gastroenteritis.  Patient states he was in his usual state of health until about 36 hours ago, when he started having lower abdominal crampy pain, associated nausea and vomiting.  Has not kept anything down until about the evening of 5/26.  Has had continued watery diarrhea.  Last vomiting was this morning.  No hematemesis, no blood in the stool.  No fever at home, but had a fever in the ER this morning.  Denies any sick contacts, denies any recent international travel.  He ate some sushi, but it was cooked, and he ate it on 5/24.  Review of Systems: Please see HPI for pertinent positives and negatives. A complete 10 system review of systems are otherwise negative.  Past Medical History:  Diagnosis Date   Anemia    Dialysis patient [facility])    Heart murmur    HIV disease (HCC)    Hypertension    Lymphocytosis 06/16/2014   Noncompliance 03/06/2015   Post herpetic neuralgia 11/25/2016   Left V1 distribution   Renal disorder    Seizure (HCC)    Transplanted kidney    Past Surgical History:  Procedure Laterality Date   AV FISTULA PLACEMENT Left 05/07/2013   Procedure: ARTERIOVENOUS (AV) FISTULA CREATION- LEFT;  Surgeon: Mayo Speck, MD;  Location: [facility] OR;  Service: Vascular;  Laterality: Left;   AV FISTULA PLACEMENT Left 08/12/2013   Procedure: ARTERIOVENOUS (AV) FISTULA CREATION BRACHIOCEPHALIC;  Surgeon: Dannis Dy, MD;  Location: [facility] OR;  Service: Vascular;  Laterality: Left;   INSERTION OF DIALYSIS CATHETER     x 2   KIDNEY TRANSPLANT     LIGATION OF ARTERIOVENOUS  FISTULA Left 08/12/2013   Procedure: LIGATION OF ARTERIOVENOUS  FISTULA  RADIOCEPHALIC;  Surgeon: Dannis Dy, MD;  Location: [facility] OR;  Service: Vascular;  Laterality: Left;   Social History:  reports that he has never smoked. He has never used smokeless tobacco. He reports that he does not currently use alcohol. He reports that he does not use drugs.  Allergies  Allergen Reactions   Codeine Itching   Egg-Derived Products     Shortness of breath   Heparin  Itching    Severe itching   Mercury     Childhood allergy   Tomato Itching    Family History  Problem Relation Age of Onset   Kidney failure Mother    Colon polyps Father    Kidney disease Paternal Uncle    Kidney disease Maternal Grandfather    Kidney failure Maternal Uncle      Prior to Admission medications   Medication Sig Start Date End Date Taking? Authorizing Provider  abacavir  (ZIAGEN ) 300 MG tablet TAKE 2 TABLETS BY MOUTH DAILY. STOP ZIDOVUDINE , STOP PREZCOBIX  05/12/17  Yes Comer, Judithann Novas, MD  BIKTARVY 50-200-25 MG TABS tablet Take 1 tablet by mouth daily.   Yes [provider]  carvedilol  (COREG ) 6.25 MG tablet Take 1 tablet (6.25 mg total) by mouth 2 (two) times daily. 02/07/15  Yes Lucendia Rusk, MD  cetirizine (ZYRTEC) 10 MG tablet Take 10 mg by mouth daily.   Yes [provider]  gabapentin (NEURONTIN) 100 MG capsule Take 300  mg by mouth daily. 12/05/22  Yes [provider]  K Phos Mono-Sod Phos Di & Mono (K-PHOS-NEUTRAL) 155-852-130 MG TABS Take by mouth. 07/30/18  Yes [provider]  lamivudine  (EPIVIR ) 100 MG tablet Take 0.5 tablets (50 mg total) by mouth daily. 05/12/17  Yes Comer, Judithann Novas, MD  mycophenolate (MYFORTIC) 180 MG EC tablet Take 720 mg by mouth 2 (two) times daily. 03/01/19  Yes [provider]  predniSONE  (DELTASONE ) 50 MG tablet 1 tablet PO QD X4 days Patient taking differently: Take 5 mg by mouth daily. 1 tablet PO QD X4 days 03/08/17  Yes Eldon Greenland, MD  Pseudoephedrine-APAP-DM (TYLENOL  COLD/FLU DAY)  30-500-15 MG TABS Take 2 tablets by mouth daily.   Yes [provider]  sodium bicarbonate 650 MG tablet Take 650 mg by mouth daily. 03/01/19  Yes [provider]  tacrolimus  (PROGRAF ) 1 MG capsule 5 mg in the am and 5 mg in the pm 03/11/19  Yes [provider]  carbamazepine  (TEGRETOL ) 200 MG tablet 1 tablet by mouth twice daily. Call to schedule follow-up to receive any future refills Patient not taking: Reported on 04/07/2024 09/15/17   Brian Campanile, MD  doxycycline  (VIBRAMYCIN ) 100 MG capsule Take 1 capsule (100 mg total) by mouth 2 (two) times daily. Patient not taking: Reported on 04/07/2024 05/08/21   Zackowski, Scott, MD  ELIQUIS 2.5 MG TABS tablet Take 2.5 mg by mouth 2 (two) times daily.    [provider]  rilpivirine  (EDURANT) 25 MG TABS tablet TAKE 1 TABLET(25 MG) BY MOUTH DAILY WITH BREAKFAST Patient not taking: Reported on 04/07/2024 05/12/17   Lina Render, MD  TIVICAY  50 MG tablet TAKE 1 TABLET(50 MG) BY MOUTH DAILY Patient not taking: Reported on 04/07/2024 01/10/17   Lina Render, MD    Physical Exam: BP 108/71 (BP Location: Right Arm)   Pulse (!) 122   Temp 100.3 F (37.9 C) (Oral)   Resp 14   Ht 5\' 7"  (1.702 m)   Wt 87.2 kg   SpO2 98%   BMI 30.10 kg/m  General:  Alert, oriented, calm, in no acute distress, looks comfortable Cardiovascular: RRR, no murmurs or rubs, no peripheral edema  Respiratory: clear to auscultation bilaterally, no wheezes, no crackles  Abdomen: soft, non-tender, nondistended, normal bowel tones heard  Skin: dry, no rashes  Musculoskeletal: no joint effusions, normal range of motion  Psychiatric: appropriate affect, normal speech  Neurologic: extraocular muscles intact, clear speech, moving all extremities with intact sensorium         Labs on Admission:  Basic Metabolic Panel: Recent Labs  Lab 04/07/24 0618  NA 139  K 4.1  CL 109  CO2 17*  GLUCOSE 162*  BUN 18  CREATININE 1.92*  CALCIUM  9.6    Liver Function Tests: Recent Labs  Lab 04/07/24 0618  AST 18  ALT 23  ALKPHOS 56  BILITOT 0.5  PROT 6.9  ALBUMIN 4.2   Recent Labs  Lab 04/07/24 0618  LIPASE 44   No results for input(s): "AMMONIA" in the last 168 hours. CBC: Recent Labs  Lab 04/07/24 0618  WBC 13.9*  HGB 13.5  HCT 40.0  MCV 95.7  PLT 176   Cardiac Enzymes: No results for input(s): "CKTOTAL", "CKMB", "CKMBINDEX", "TROPONINI" in the last 168 hours. BNP (last 3 results) No results for input(s): "BNP" in the last 8760 hours.  ProBNP (last 3 results) No results for input(s): "PROBNP" in the last 8760 hours.  CBG: No  results for input(s): "GLUCAP" in the last 168 hours.  Radiological Exams on Admission: CT ABDOMEN PELVIS WO CONTRAST Result Date: 04/07/2024 CLINICAL DATA:  Lower abdominal pain, nausea, vomiting, diarrhea EXAM: CT ABDOMEN AND PELVIS WITHOUT CONTRAST TECHNIQUE: Multidetector CT imaging of the abdomen and pelvis was performed following the standard protocol without IV contrast. Unenhanced CT was performed per clinician order. Lack of IV contrast limits sensitivity and specificity, especially for evaluation of abdominal/pelvic solid viscera. RADIATION DOSE REDUCTION: This exam was performed according to the departmental dose-optimization program which includes automated exposure control, adjustment of the mA and/or kV according to patient size and/or use of iterative reconstruction technique. COMPARISON:  12/08/2021 FINDINGS: Lower chest: Minimal dependent hypoventilatory changes. No acute pleural or parenchymal lung disease. Hepatobiliary: Unremarkable unenhanced appearance of the liver and gallbladder. Pancreas: Unremarkable unenhanced appearance. Spleen: Unremarkable unenhanced appearance. Adrenals/Urinary Tract: Right lower quadrant transplant kidney is identified, with no urinary tract calculi or signs of obstruction. No perinephric fluid collection. The native kidneys are markedly atrophic,  stable. The adrenals and bladder are unremarkable. Stomach/Bowel: No bowel obstruction or ileus. Normal appendix right lower quadrant. There is mild wall thickening and pericolonic fat stranding involving the ascending and transverse colon, consistent with inflammatory or infectious colitis. Scattered gas fluid levels throughout the large and small bowel consistent with history of diarrheal illness. Small hiatal hernia. Vascular/Lymphatic: No significant vascular findings on this unenhanced exam. No pathologic adenopathy. Reproductive: Prostate is unremarkable. Other: No free fluid or free intraperitoneal gas. No abdominal wall hernia. Musculoskeletal: No acute or destructive bony abnormalities. Reconstructed images demonstrate no additional findings. IMPRESSION: 1. Wall thickening and pericolonic fat stranding involving the proximal colon, consistent with inflammatory or infectious colitis. 2. Scattered gas fluid levels throughout the large and small bowel consistent with diarrheal illness. No obstruction or ileus. 3. Unremarkable right lower quadrant transplant kidney. 4. Small hiatal hernia. Electronically Signed   By: Bobbye Burrow M.D.   On: 04/07/2024 09:28   Assessment/Plan Dylan Barber. is a 48 y.o. male with medical history significant for HTN, seizure, HIV disease, renal transplant 2019 being admitted to the hospital with viral gastroenteritis.  Gastroenteritis-given the history, suspect this is a viral gastroenteritis.  Does not appear to be any clear indication for antibiotics.  No recent suspicious food intake, or international travel, antibiotics, etc. which would point towards bacterial diarrhea. -Inpatient admission -P.o. diet as tolerated -IV fluids -Supportive care with pain and nausea medications as needed -In light of chronic prednisone , consider stress dose steroids if develops hypotension, hyponatremia, etc.  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  Patient tell me his baseline creatinine is approximately 1.5-1.7.  He presents with mild renal insufficiency, not meeting criteria for acute kidney injury. -Continue Prograf , Myfortic, prednisone  5 mg p.o. daily -Sodium bicarb daily -Hydrate, monitor renal function with daily labs -Renally dose medications as indicated  Neuropathy-gabapentin daily  DVT prophylaxis: SCDs only due to intolerance of heparin  products    Code Status: Full Code  Consults called: None  Admission status: The appropriate patient status for this patient is INPATIENT. Inpatient status is judged to be reasonable and necessary in order to provide the required intensity of service to ensure the patient's safety. The patient's presenting symptoms, physical exam findings, and initial radiographic and laboratory data in the context of their chronic comorbidities is felt to place them at high risk for further  clinical deterioration. Furthermore, it is not anticipated that the patient will be medically stable for discharge from the hospital within 2 midnights of admission.    I certify that at the point of admission it is my clinical judgment that the patient will require inpatient hospital care spanning beyond 2 midnights from the point of admission due to high intensity of service, high risk for further deterioration and high frequency of surveillance required  Time spent: 59 minutes  Dylan Lamountain Rickey Charm MD [facility] Pager [phone]  If 7PM-7AM, please contact night-coverage [email or web address] Password [email or web address]  04/07/2024, 1:16 PM

## 2024-04-07 NOTE — ED Triage Notes (Signed)
 Pt is here for evaluation of abdominal pain (lower) and nausea/vomiting and diarrhea which began Tuesday.  Pt states that he feels like he is dehydrated, he has had 6 episodes of vomiting in the past 24 hours and about 10 episodes of diarrhea.  S/P kidney transplant June 2019.

## 2024-04-07 NOTE — ED Notes (Addendum)
Patient given urinal for urine specimen.

## 2024-04-07 NOTE — TOC Initial Note (Signed)
 Transition of Care (TOC) - Initial/Assessment Note    Patient Details  Name: Dylan Barber. MRN: [id number] Date of Birth: 10-28-1976  Transition of Care [facility]) CM/SW Contact:    Ruben Corolla, RN Phone Number: 04/07/2024, 3:27 PM  Clinical Narrative:  d/c plan home.                 Expected Discharge Plan: Home/Self Care Barriers to Discharge: Continued Medical Work up   Patient Goals and CMS Choice Patient states their goals for this hospitalization and ongoing recovery are:: Home CMS Medicare.gov Compare Post Acute Care list provided to:: Patient Choice offered to / list presented to : Patient [place] ownership interest in [facility].provided to:: Patient    Expected Discharge Plan and Services                                              Prior Living Arrangements/Services                       Activities of Daily Living   ADL Screening (condition at time of admission) Independently performs ADLs?: Yes (appropriate for developmental age) Is the patient deaf or have difficulty hearing?: No Does the patient have difficulty seeing, even when wearing glasses/contacts?: No Does the patient have difficulty concentrating, remembering, or making decisions?: No  Permission Sought/Granted                  Emotional Assessment              Admission diagnosis:  Dehydration [E86.0] Gastroenteritis [K52.9] AKI (acute kidney injury) (HCC) [N17.9] Patient Active Problem List   Diagnosis Date Noted   AKI (acute kidney injury) (HCC) 04/07/2024   Gastroenteritis 04/07/2024   Post herpetic neuralgia 11/25/2016   Zoster ophthalmicus 08/09/2015   Screening examination for venereal disease 04/06/2015   Noncompliance 03/06/2015   Parvovirus B19 infection 08/10/2014   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   Human immunodeficiency virus (HIV) disease (HCC) 03/28/2014   Pulmonary edema 03/27/2014   Muscle  twitching 12/14/2013   Internal and external bleeding hemorrhoids 08/26/2013   Hypertension    ESRD (end stage renal disease) on dialysis (HCC) 05/04/2013   Normocytic anemia 05/04/2013   PCP:  Chaneta Comer, MD Pharmacy:   [facility] #[id number] Jonette Nestle, [place] - [address] AT [facility] OF [facility] [address] [address] Phone: [phone] Fax: [phone]  [facility] #[id number] Jonette Nestle, [place] - [address] AT [facility] [address] & [address] [place] [address] Phone: [phone] Fax: [phone]  [facility] - [facility] [address] [place] [address] Phone: [phone] Fax: [phone]     Social Drivers of Health (SDOH) Social History: SDOH Screenings   Food Insecurity: No Food Insecurity (04/07/2024)  Housing: Low Risk  (04/07/2024)  Transportation Needs: No Transportation Needs (04/07/2024)  Utilities: Not At Risk (04/07/2024)  Tobacco Use: Low Risk  (04/07/2024)   SDOH Interventions:     Readmission Risk Interventions     No data to display

## 2024-04-08 ENCOUNTER — Encounter (HOSPITAL_COMMUNITY): Payer: Self-pay | Admitting: Internal Medicine

## 2024-04-08 ENCOUNTER — Inpatient Hospital Stay (HOSPITAL_COMMUNITY)

## 2024-04-08 DIAGNOSIS — N179 Acute kidney failure, unspecified: Secondary | ICD-10-CM | POA: Diagnosis not present

## 2024-04-08 LAB — BASIC METABOLIC PANEL WITH GFR
Anion gap: 6 (ref 5–15)
BUN: 26 mg/dL — ABNORMAL HIGH (ref 6–20)
CO2: 16 mmol/L — ABNORMAL LOW (ref 22–32)
Calcium: 7.6 mg/dL — ABNORMAL LOW (ref 8.9–10.3)
Chloride: 109 mmol/L (ref 98–111)
Creatinine, Ser: 2.9 mg/dL — ABNORMAL HIGH (ref 0.61–1.24)
GFR, Estimated: 26 mL/min — ABNORMAL LOW (ref >60)
Glucose, Bld: 97 mg/dL (ref 70–99)
Potassium: 3.7 mmol/L (ref 3.5–5.1)
Sodium: 131 mmol/L — ABNORMAL LOW (ref 135–145)

## 2024-04-08 LAB — C DIFFICILE QUICK SCREEN W PCR REFLEX
C Diff antigen: NEGATIVE
C Diff interpretation: NOT DETECTED
C Diff toxin: NEGATIVE

## 2024-04-08 LAB — GASTROINTESTINAL PANEL BY PCR, STOOL (REPLACES STOOL CULTURE)

## 2024-04-08 LAB — CBC
HCT: 39.6 % (ref 39.0–52.0)
Hemoglobin: 12.6 g/dL — ABNORMAL LOW (ref 13.0–17.0)
MCH: 31.9 pg (ref 26.0–34.0)
MCHC: 31.8 g/dL (ref 30.0–36.0)
MCV: 100.3 fL — ABNORMAL HIGH (ref 80.0–100.0)
Platelets: 147 10*3/uL — ABNORMAL LOW (ref 150–400)
RBC: 3.95 MIL/uL — ABNORMAL LOW (ref 4.22–5.81)
RDW: 13.1 % (ref 11.5–15.5)
WBC: 9.9 10*3/uL (ref 4.0–10.5)
nRBC: 0 % (ref 0.0–0.2)

## 2024-04-08 MED ORDER — ACETAMINOPHEN 325 MG PO TABS: 650.0000 mg | ORAL_TABLET | Freq: Four times a day (QID) | ORAL | Status: DC | PRN

## 2024-04-08 MED ORDER — CARVEDILOL 12.5 MG PO TABS
12.5000 mg | ORAL_TABLET | Freq: Two times a day (BID) | ORAL | Status: DC
  Administered 2024-04-09 – 2024-04-10 (×2): 12.5 mg via ORAL
  Filled 2024-04-08 (×4): qty 1

## 2024-04-08 MED ORDER — ALBUTEROL SULFATE (2.5 MG/3ML) 0.083% IN NEBU: 2.5000 mg | INHALATION_SOLUTION | RESPIRATORY_TRACT | Status: DC | PRN

## 2024-04-08 MED ORDER — OXYCODONE HCL 5 MG PO TABS: 5.0000 mg | ORAL_TABLET | ORAL | Status: DC | PRN

## 2024-04-08 MED ORDER — SODIUM CHLORIDE 0.9 % IV SOLN
1.0000 g | Freq: Two times a day (BID) | INTRAVENOUS | Status: DC
  Administered 2024-04-08 – 2024-04-09 (×2): 1 g via INTRAVENOUS
  Filled 2024-04-08 (×2): qty 20

## 2024-04-08 MED ORDER — SODIUM CHLORIDE 0.9 % IV SOLN: INTRAVENOUS | Status: AC

## 2024-04-08 MED ORDER — SODIUM CHLORIDE 0.9 % IV SOLN: 150.0000 mL | INTRAVENOUS | Status: DC

## 2024-04-08 MED ORDER — CARVEDILOL 12.5 MG PO TABS: 12.5000 mg | ORAL_TABLET | Freq: Two times a day (BID) | ORAL | Status: DC

## 2024-04-08 MED ORDER — TRAZODONE HCL 50 MG PO TABS: 25.0000 mg | ORAL_TABLET | Freq: Every evening | ORAL | Status: DC | PRN

## 2024-04-08 MED ORDER — HYDROMORPHONE HCL 1 MG/ML IJ SOLN: 0.5000 mg | INTRAMUSCULAR | Status: DC | PRN

## 2024-04-08 MED ORDER — SODIUM CHLORIDE 0.9 % IV SOLN: 1.0000 g | Freq: Two times a day (BID) | INTRAVENOUS | Status: DC

## 2024-04-08 MED ORDER — SODIUM CHLORIDE 0.9 % IV BOLUS
2000.0000 mL | Freq: Once | INTRAVENOUS | Status: AC
  Administered 2024-04-08: 2000 mL via INTRAVENOUS

## 2024-04-08 MED ORDER — ONDANSETRON HCL 4 MG PO TABS: 4.0000 mg | ORAL_TABLET | Freq: Four times a day (QID) | ORAL | Status: DC | PRN

## 2024-04-08 NOTE — Care Management Obs Status (Signed)
 MEDICARE OBSERVATION STATUS NOTIFICATION   Patient Details  Name: Dylan Barber. MRN: [id number] Date of Birth: 1975/11/12   Medicare Observation Status Notification Given:  Yes    Marquavis Hannen, RN 04/08/2024, 6:03 PM

## 2024-04-08 NOTE — Progress Notes (Addendum)
 PROGRESS NOTE  Dylan Barber.  DOB: 03-04-1976  PCP: Chaneta Comer, MD ZOX:[id number]  DOA: 04/07/2024  LOS: 1 day  Hospital Day: 2  Brief narrative: Dylan Stegman. is a 48 y.o. male with PMH significant for HIV disease, renal transplant 2019, HTN, seizure. Patient follows up with nephrologist Dr. Jefrey Mink at [facility]. 5/28, patient presented to the ED with 2 days of crampy abdominal pain, nausea, vomiting, diarrhea, low PO intake.   In the ED, patient had a temperature of 100.3, heart rate 117, blood pressure 162/107, breathing on room air. Labs with WC count elevated to 13.9, BUN/creatinine 18/1.9 CT abdomen pelvis without contrast showed 1. Wall thickening and pericolonic fat stranding involving the proximal colon, consistent with inflammatory or infectious colitis. 2. Scattered gas fluid levels throughout the large and small bowel consistent with diarrheal illness. No obstruction or ileus.  Admitted to [facility] Started IV fluid  Subjective: Patient was seen and examined this morning.   Chart reviewed. Had low-grade temperature overnight, Tmax 100.9 at 3 AM.  Still running tachycardic, blood pressure in 120s, breathing on room air Repeat labs this morning with creatinine 2.9  At the time of my evaluation, middle-aged African-American male. Lying down in bed.  Continues to have watery diarrhea, once almost every hour.  His spouse was on the phone. Patient is very anxious about his worsening kidney function.   He states he requested the ER doctor yesterday to transfer him to [facility] but the only choice he was given was [facility] or to leave against medical advice.  Patient believes that he has not received aggressive care as he should be receiving.  He asked to be transferred to [facility].  Assessment and plan: Acute gastroenteritis EIEC colitis Likely food poisoning versus viral C. difficile assay negative. GI pathogen panel positive for  EIEC Blood culture sent Continues to have diarrhea.  Currently on NS at 150 mL/h Based on stool culture report of EIEC, and patient's immunocompromise status with HIV and renal transplant meds, I have started the patient on IV meropenem per 'OpenEvidence' guideline, Recent Labs  Lab 04/07/24 0618 04/08/24 0458  WBC 13.9* 9.9   AKI on CKD 3a Acute metabolic acidosis Baseline creatinine 1.5-1.7.  Presented with creatinine elevated 1.92.  Worse at 2.9 today, bicarb level low at 16 likely due to ongoing diarrhea. Nephrology consult called to Dr. Zana Hesselbach  Recent Labs    01/26/24 1837 04/07/24 0618 04/08/24 0458  BUN 18 18 26*  CREATININE 1.60* 1.92* 2.90*  CO2 23 17* 16*   Renal transplant status 2019 On immunosuppression with tacrolimus , mycophenolate and prednisone . Continue all  HIV disease well-controlled continue Biktarvy and Bactrim  as before   Hypertension On Coreg  25 mg twice daily    Neuropathy Keep gabapentin on hold  At patient's request, I called with first [facility] patient transfer line this morning to initiate the transfer. This afternoon, I received a call from patient transfer RN Dylan Barber from internal medicine department at [facility].  Patient has been accepted to [facility].  But bed is not available yet, likely to open within next 36 hours.   Mobility: Encourage ambulation  Goals of care   Code Status: Full Code    DVT prophylaxis:  SCDs Start: 04/07/24 1300   Antimicrobials: Biktarvy, Bactrim  MWF Fluid: NS at 150 mL/h Consultants: Nephrology Family Communication: Spouse on the phone  Status: Inpatient Level of care:  Progressive   Patient is from: Home  Needs to continue in-hospital care: Needs IV fluid, renal function monitoring. Also pending transfer to [facility]    Diet:  Diet Order             Diet regular Room service appropriate? Yes; Fluid consistency: Thin  Diet effective now                   Scheduled  Meds:  bictegravir-emtricitabine -tenofovir  AF  1 tablet Oral QHS   carvedilol   25 mg Oral BID WC   loratadine   10 mg Oral Daily   mycophenolate   720 mg Oral BID   predniSONE   5 mg Oral Q breakfast   sodium bicarbonate   650 mg Oral Daily   sulfamethoxazole -trimethoprim   1 tablet Oral Q M,W,F   tacrolimus   5 mg Oral BID    PRN meds: acetaminophen  **OR** acetaminophen , albuterol , HYDROmorphone  (DILAUDID ) injection, ondansetron  **OR** ondansetron  (ZOFRAN ) IV, mouth rinse, oxyCODONE , traZODone    Infusions:   sodium chloride  150 mL/hr at 04/08/24 1003    Antimicrobials: Anti-infectives (From admission, onward)    Start     Dose/Rate Route Frequency Ordered Stop   04/07/24 2200  bictegravir-emtricitabine -tenofovir  AF (BIKTARVY ) 50-200-25 MG per tablet 1 tablet        1 tablet Oral Daily at bedtime 04/07/24 1301     04/07/24 1700  sulfamethoxazole -trimethoprim  (BACTRIM ) 400-80 MG per tablet 1 tablet        1 tablet Oral Every M-W-F 04/07/24 1343     04/07/24 1400  abacavir  (ZIAGEN ) tablet 600 mg  Status:  Discontinued       Note to Pharmacy: TAKE 2 TABLETS BY MOUTH DAILY. STOP ZIDOVUDINE , STOP PREZCOBIX      600 mg Oral Daily 04/07/24 1301 04/07/24 1340   04/07/24 1400  lamivudine  (EPIVIR ) tablet 50 mg  Status:  Discontinued        50 mg Oral Daily 04/07/24 1301 04/07/24 1337       Objective: Vitals:   04/08/24 0629 04/08/24 1006  BP: 120/76 119/82  Pulse: (!) 117 (!) 126  Resp: 18 20  Temp: 98.9 F (37.2 C) 99.9 F (37.7 C)  SpO2: 97% 95%    Intake/Output Summary (Last 24 hours) at 04/08/2024 1407 Last data filed at 04/08/2024 0400 Gross per 24 hour  Intake 2273.25 ml  Output --  Net 2273.25 ml   Filed Weights   04/07/24 1249  Weight: 87.2 kg   Weight change:  Body mass index is 30.1 kg/m.   Physical Exam: General exam: Pleasant, middle-aged African-American male Skin: No rashes, lesions or ulcers. HEENT: Atraumatic, normocephalic, no obvious bleeding Lungs:  Clear to auscultation bilaterally,  CVS: S1, S2, no murmur,   GI/Abd: Soft, nontender, nondistended, bowel sound present,   CNS: Alert, awake, oriented x 3 Psychiatry: Very anxious regarding his renal function Extremities: No pedal edema, no calf tenderness,   Data Review: I have personally reviewed the laboratory data and studies available.  F/u labs  Unresulted Labs (From admission, onward)     Start     Ordered   04/09/24 0500  Basic metabolic panel with GFR  Tomorrow morning,   R        04/08/24 1407   04/09/24 0500  CBC with Differential/Platelet  Tomorrow morning,   R        04/08/24 1407   04/08/24 0343  Gastrointestinal Panel by PCR , Stool  (Gastrointestinal Panel by PCR, Stool                                                                                                                                                     **  Does Not include CLOSTRIDIUM DIFFICILE testing. **If CDIFF testing is needed, place order from the "C Difficile Testing" order set.**)  Once,   R        04/08/24 0343           Signed, Hoyt Macleod, MD [facility] 04/08/2024

## 2024-04-08 NOTE — Consult Note (Signed)
 Renal Service Consult Note [facility]  Dylan Barber. 04/08/2024 Lynae Sandifer, MD Requesting Physician: Dr. Gwynneth Lessen  Reason for Consult: REnal failure  HPI: The patient is a 48 y.o. year-old w/ PMH as below who presented to ED c/o abd pain, and n/V/D for the last 2 days. Hx of kidney transplant in 2019. In ED 5/28 early am BP/ 155/102, HR 123, RR 24, temp 100.8. Labs showed Na 139, K 4.1, creat 1.92. Pt rec'd IV dilaudid , tylenol , 2 L bolus IVF's then NS at 150 cc/hr, prograf , bactrim , prednisone , myfortic, fentanyl , coreg  25mg , biktarvy. Pt was admitted. Then BP's dropped yest afternoon into the 100/70 range. Today BP's are 115/ 80 range. Creat 1.9 on admission (1.60 on 01/26/24) yesterday, and today creat is 2.90. We are asked to see for renal failure.    Pt seen in room. Not feeling better, still having diarrhea. No sig abd pain. Voiding okay.    ROS - denies CP, no joint pain, no HA, no blurry vision, no rash  PMH: Anemia  ESRD sp renal transplant HIV disease HTN H/o seizure   Past Surgical History  Past Surgical History:  Procedure Laterality Date   AV FISTULA PLACEMENT Left 05/07/2013   Procedure: ARTERIOVENOUS (AV) FISTULA CREATION- LEFT;  Surgeon: Mayo Speck, MD;  Location: [facility] OR;  Service: Vascular;  Laterality: Left;   AV FISTULA PLACEMENT Left 08/12/2013   Procedure: ARTERIOVENOUS (AV) FISTULA CREATION BRACHIOCEPHALIC;  Surgeon: Dannis Dy, MD;  Location: [facility] OR;  Service: Vascular;  Laterality: Left;   INSERTION OF DIALYSIS CATHETER     x 2   KIDNEY TRANSPLANT     LIGATION OF ARTERIOVENOUS  FISTULA Left 08/12/2013   Procedure: LIGATION OF ARTERIOVENOUS  FISTULA RADIOCEPHALIC;  Surgeon: Dannis Dy, MD;  Location: [facility] OR;  Service: Vascular;  Laterality: Left;   Family History  Family History  Problem Relation Age of Onset   Kidney failure Mother    Colon polyps Father    Kidney disease Paternal Uncle    Kidney disease  Maternal Grandfather    Kidney failure Maternal Uncle    Social History  reports that he has never smoked. He has never used smokeless tobacco. He reports that he does not currently use alcohol. He reports that he does not use drugs. Allergies  Allergies  Allergen Reactions   Codeine Itching   Egg-Derived Products     Shortness of breath   Heparin  Itching    Severe itching   Mercury     Childhood allergy   Tomato Itching   Home medications Prior to Admission medications   Medication Sig Start Date End Date Taking? Authorizing Provider  BIKTARVY 50-200-25 MG TABS tablet Take 1 tablet by mouth daily.   Yes [provider]  carvedilol  (COREG ) 25 MG tablet Take 25 mg by mouth 2 (two) times daily with a meal.   Yes [provider]  cetirizine (ZYRTEC) 10 MG tablet Take 10 mg by mouth daily.   Yes [provider]  gabapentin (NEURONTIN) 100 MG capsule Take 100 mg by mouth 3 (three) times daily as needed (neuropathy pain). 12/05/22  Yes [provider]  mycophenolate (MYFORTIC) 180 MG EC tablet Take 720 mg by mouth 2 (two) times daily. 03/01/19  Yes [provider]  predniSONE  (DELTASONE ) 5 MG tablet Take 5 mg by mouth daily with breakfast.   Yes [provider]  sodium bicarbonate 650 MG tablet Take 650 mg by mouth daily.  03/01/19  Yes [provider]  sulfamethoxazole -trimethoprim  (BACTRIM ) 400-80 MG tablet Take 1 tablet by mouth 3 (three) times a week. Takes on Monday, Wednesday and Friday   Yes [provider]  tacrolimus  (PROGRAF ) 5 MG capsule Take 5 mg by mouth 2 (two) times daily.   Yes [provider]     Vitals:   04/08/24 0507 04/08/24 0629 04/08/24 1006 04/08/24 1504  BP:  120/76 119/82 103/69  Pulse:  (!) 117 (!) 126 (!) 113  Resp:  18 20 20   Temp: 99 F (37.2 C) 98.9 F (37.2 C) 99.9 F (37.7 C) 99.1 F (37.3 C)  TempSrc: Oral Oral Oral Oral  SpO2:  97% 95% 94%  Weight:      Height:        Exam Gen alert, no distress No rash, cyanosis or gangrene Sclera anicteric, throat clear  No jvd or bruits Chest clear bilat to bases, no rales/ wheezing RRR no MRG Abd soft ntnd no mass or ascites +bs GU nl male MS no joint effusions or deformity Ext no LE or UE edema, no other edema Neuro is alert, Ox 3 , nf    Renal-related home meds: Coreg  25 bid Pred 5 qam Prograf  5 mg bid Myfortic 720 mg bid Sod bicarb 650 daily Bactrim  400-80mg  1 tab mwf  UA negative UNa, Ucr pending Renal US  pending    Assessment/ Plan: 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 Presented w/ creat 1.9 after 2 day illness w/ sig acute diarrhea at home. BP's were high early yest when pt arrived, then dropped a few hrs later into 100/70s range (only ED meds given prior to that were IV fentanyl  and IV morphine ). This am creat up to 2.9. Pt is voiding. No IV contrast, no acei/ ARB. Suspect AKI due to hypotension/ hypovolemia related to acute GI infection and diarrheal losses. Recommend bolus another 2 L over 4 hours, cont IVF"s at 150 cc/hr. Keep SBP > 120 w/ AKI. Get I/O's and daily wts. Will follow.  HTN: takes coreg  25 bid at home. Want to avoid hypotension in this acute setting, will lower coreg  dose by 50% and add hold orders for SBP < 130.  Renal transplant: cont myfortic, pred and prograf  at usual dosing.  HIV      Larry Poag  MD [facility] 04/08/2024, 4:32 PM  Recent Labs  Lab 04/07/24 0618 04/08/24 0458  HGB 13.5 12.6*  ALBUMIN 4.2  --   CALCIUM  9.6 7.6*  CREATININE 1.92* 2.90*  K 4.1 3.7   Inpatient medications:  bictegravir-emtricitabine-tenofovir AF  1 tablet Oral QHS   carvedilol   25 mg Oral BID WC   loratadine  10 mg Oral Daily   mycophenolate  720 mg Oral BID   predniSONE   5 mg Oral Q breakfast   sodium bicarbonate  650 mg Oral Daily   sulfamethoxazole -trimethoprim   1 tablet Oral Q M,W,F   tacrolimus   5 mg Oral BID    sodium chloride  150 mL/hr at  04/08/24 1003   meropenem (MERREM) IV 1 g (04/08/24 1543)   acetaminophen  **OR** acetaminophen , albuterol , HYDROmorphone  (DILAUDID ) injection, ondansetron  **OR** ondansetron  (ZOFRAN ) IV, mouth rinse, oxyCODONE , traZODone

## 2024-04-08 NOTE — Discharge Summary (Signed)
 Physician Discharge Summary  Dylan Barber. ZOX:[id number] DOB: 1976-06-01 DOA: 04/07/2024  PCP: Jefrey Mink I, MD  Admit date: 04/07/2024 Discharge date: 04/08/2024  Admitted From: Home Discharge disposition: Transfer to [facility]   Brief narrative: Dylan Hodapp. is a 48 y.o. male with PMH significant for HIV disease, renal transplant 2019, HTN, seizure. Patient follows up with nephrologist Dr. Jefrey Mink at [facility]. 5/28, patient presented to the ED with 2 days of crampy abdominal pain, nausea, vomiting, diarrhea, low PO intake.   In the ED, patient had a temperature of 100.3, heart rate 117, blood pressure 162/107, breathing on room air. Labs with WC count elevated to 13.9, BUN/creatinine 18/1.9 CT abdomen pelvis without contrast showed 1. Wall thickening and pericolonic fat stranding involving the proximal colon, consistent with inflammatory or infectious colitis. 2. Scattered gas fluid levels throughout the large and small bowel consistent with diarrheal illness. No obstruction or ileus.  Admitted to [facility] Started IV fluid  Subjective: Patient was seen and examined this morning.   Chart reviewed. Had low-grade temperature overnight, Tmax 100.9 at 3 AM.  Still running tachycardic, blood pressure in 120s, breathing on room air Repeat labs this morning with creatinine 2.9  At the time of my evaluation, middle-aged African-American male. Lying down in bed.  Continues to have watery diarrhea, once almost every hour.  His spouse was on the phone. Patient is very anxious about his worsening kidney function.   He states he requested the ER doctor yesterday to transfer him to [facility] but the only choice he was given was [facility] or to leave against medical advice.  Patient believes that he has not received aggressive care as he should be receiving.  He asked to be transferred to [facility].  Assessment and plan: Acute  gastroenteritis EIEC colitis Likely food poisoning versus viral C. difficile assay negative. GI pathogen panel positive for EIEC Blood culture sent Continues to have diarrhea.  Currently on NS at 150 mL/h Based on stool culture report of EIEC, and patient's immunocompromise status with HIV and renal transplant meds, I have started the patient on IV meropenem per 'OpenEvidence' guideline, Recent Labs  Lab 04/07/24 0618 04/08/24 0458  WBC 13.9* 9.9   AKI on CKD 3a Acute metabolic acidosis Baseline creatinine 1.5-1.7.  Presented with creatinine elevated 1.92.  Worse at 2.9 today, bicarb level low at 16 likely due to ongoing diarrhea. Nephrology consult called to Dr. Zana Hesselbach  Recent Labs    01/26/24 1837 04/07/24 0618 04/08/24 0458  BUN 18 18 26*  CREATININE 1.60* 1.92* 2.90*  CO2 23 17* 16*   Renal transplant status 2019 On immunosuppression with tacrolimus , mycophenolate and prednisone . Continue all  HIV disease well-controlled continue Biktarvy and Bactrim  as before   Hypertension On Coreg  25 mg twice daily    Neuropathy Keep gabapentin on hold  At patient's request, I called with first [facility] patient transfer line this morning to initiate the transfer. This afternoon, I received a call from patient transfer RN Laddona from internal medicine department at [facility].  Patient has been accepted to [facility].  But bed is not available yet, likely to open within next 36 hours.   Mobility: Encourage ambulation  Goals of care   Code Status: Full Code    DVT prophylaxis:  SCDs Start: 04/07/24 1300   Antimicrobials: Biktarvy, Bactrim  MWF Fluid: NS at 150 mL/h Consultants: Nephrology Family Communication: Spouse on the phone  Status: Inpatient  Level of care:  Progressive   Patient is from: Home Needs to continue in-hospital care: Needs IV fluid, renal function monitoring. Also pending transfer to [facility]    Diet:  Diet Order              Diet general           Diet regular Room service appropriate? Yes; Fluid consistency: Thin  Diet effective now                   Scheduled Meds:  bictegravir-emtricitabine-tenofovir AF  1 tablet Oral QHS   carvedilol   25 mg Oral BID WC   loratadine  10 mg Oral Daily   mycophenolate  720 mg Oral BID   predniSONE   5 mg Oral Q breakfast   sodium bicarbonate  650 mg Oral Daily   sulfamethoxazole -trimethoprim   1 tablet Oral Q M,W,F   tacrolimus   5 mg Oral BID    PRN meds: acetaminophen  **OR** acetaminophen , albuterol , HYDROmorphone  (DILAUDID ) injection, ondansetron  **OR** ondansetron  (ZOFRAN ) IV, mouth rinse, oxyCODONE , traZODone   Infusions:   sodium chloride  150 mL/hr at 04/08/24 1003   meropenem (MERREM) IV 1 g (04/08/24 1543)    Antimicrobials: Anti-infectives (From admission, onward)    Start     Dose/Rate Route Frequency Ordered Stop   04/09/24 0000  meropenem 1 g in sodium chloride  0.9 % 100 mL        1 g Intravenous Every 12 hours 04/08/24 1703     04/08/24 1600  meropenem (MERREM) 1 g in sodium chloride  0.9 % 100 mL IVPB        1 g 200 mL/hr over 30 Minutes Intravenous Every 12 hours 04/08/24 1500     04/07/24 2200  bictegravir-emtricitabine-tenofovir AF (BIKTARVY) 50-200-25 MG per tablet 1 tablet        1 tablet Oral Daily at bedtime 04/07/24 1301     04/07/24 1700  sulfamethoxazole -trimethoprim  (BACTRIM ) 400-80 MG per tablet 1 tablet        1 tablet Oral Every M-W-F 04/07/24 1343     04/07/24 1400  abacavir  (ZIAGEN ) tablet 600 mg  Status:  Discontinued       Note to Pharmacy: TAKE 2 TABLETS BY MOUTH DAILY. STOP ZIDOVUDINE , STOP PREZCOBIX      600 mg Oral Daily 04/07/24 1301 04/07/24 1340   04/07/24 1400  lamivudine  (EPIVIR ) tablet 50 mg  Status:  Discontinued        50 mg Oral Daily 04/07/24 1301 04/07/24 1337       Nutritional status:  Body mass index is 30.1 kg/m.       Wounds:  -    Discharge Exam:   Vitals:   04/08/24 0507 04/08/24 0629  04/08/24 1006 04/08/24 1504  BP:  120/76 119/82 103/69  Pulse:  (!) 117 (!) 126 (!) 113  Resp:  18 20 20   Temp: 99 F (37.2 C) 98.9 F (37.2 C) 99.9 F (37.7 C) 99.1 F (37.3 C)  TempSrc: Oral Oral Oral Oral  SpO2:  97% 95% 94%  Weight:      Height:        Body mass index is 30.1 kg/m.   General exam: Pleasant, middle-aged African-American male Skin: No rashes, lesions or ulcers. HEENT: Atraumatic, normocephalic, no obvious bleeding Lungs: Clear to auscultation bilaterally,  CVS: S1, S2, no murmur,   GI/Abd: Soft, nontender, nondistended, bowel sound present,   CNS: Alert, awake, oriented x 3 Psychiatry: Very anxious regarding his renal function Extremities: No  pedal edema, no calf tenderness,   Follow ups:    Follow-up Information     Jefrey Mink I, MD Follow up.   Specialty: Internal Medicine Contact information: [address] [place] [address] [phone]                 Discharge Instructions:   Discharge Instructions     Call MD for:  difficulty breathing, headache or visual disturbances   Complete by: As directed    Call MD for:  extreme fatigue   Complete by: As directed    Call MD for:  hives   Complete by: As directed    Call MD for:  persistant dizziness or light-headedness   Complete by: As directed    Call MD for:  persistant nausea and vomiting   Complete by: As directed    Call MD for:  severe uncontrolled pain   Complete by: As directed    Call MD for:  temperature >100.4   Complete by: As directed    Diet general   Complete by: As directed    Increase activity slowly   Complete by: As directed        Discharge Medications:   Allergies as of 04/08/2024       Reactions   Codeine Itching   Egg-derived Products    Shortness of breath   Heparin  Itching   Severe itching   Mercury    Childhood allergy   Tomato Itching        Medication List     STOP taking these medications    gabapentin 100 MG  capsule Commonly known as: NEURONTIN       TAKE these medications    acetaminophen  325 MG tablet Commonly known as: TYLENOL  Take 2 tablets (650 mg total) by mouth every 6 (six) hours as needed for mild pain (pain score 1-3) or fever (or Fever >/= 101).   albuterol  (2.5 MG/3ML) 0.083% nebulizer solution Commonly known as: PROVENTIL  Take 3 mLs (2.5 mg total) by nebulization every 2 (two) hours as needed for wheezing.   Biktarvy 50-200-25 MG Tabs tablet Generic drug: bictegravir-emtricitabine-tenofovir AF Take 1 tablet by mouth daily.   carvedilol  25 MG tablet Commonly known as: COREG  Take 25 mg by mouth 2 (two) times daily with a meal.   cetirizine 10 MG tablet Commonly known as: ZYRTEC Take 10 mg by mouth daily.   HYDROmorphone  1 MG/ML injection Commonly known as: DILAUDID  Inject 0.5-1 mLs (0.5-1 mg total) into the vein every 2 (two) hours as needed for severe pain (pain score 7-10).   meropenem 1 g in sodium chloride  0.9 % 100 mL Inject 1 g into the vein every 12 (twelve) hours. Start taking on: Apr 09, 2024   mycophenolate 180 MG EC tablet Commonly known as: MYFORTIC Take 720 mg by mouth 2 (two) times daily.   ondansetron  4 MG tablet Commonly known as: ZOFRAN  Take 1 tablet (4 mg total) by mouth every 6 (six) hours as needed for nausea.   oxyCODONE  5 MG immediate release tablet Commonly known as: Oxy IR/ROXICODONE  Take 1 tablet (5 mg total) by mouth every 4 (four) hours as needed for moderate pain (pain score 4-6).   predniSONE  5 MG tablet Commonly known as: DELTASONE  Take 5 mg by mouth daily with breakfast.   sodium bicarbonate 650 MG tablet Take 650 mg by mouth daily.   sodium chloride  0.9 % infusion Inject 150 mLs into the vein continuous.   sulfamethoxazole -trimethoprim  400-80 MG  tablet Commonly known as: BACTRIM  Take 1 tablet by mouth 3 (three) times a week. Takes on Monday, Wednesday and Friday   tacrolimus  5 MG capsule Commonly known as:  PROGRAF  Take 5 mg by mouth 2 (two) times daily.   traZODone 50 MG tablet Commonly known as: DESYREL Take 0.5 tablets (25 mg total) by mouth at bedtime as needed for sleep.         The results of significant diagnostics from this hospitalization (including imaging, microbiology, ancillary and laboratory) are listed below for reference.    Procedures and Diagnostic Studies:   CT ABDOMEN PELVIS WO CONTRAST Result Date: 04/07/2024 CLINICAL DATA:  Lower abdominal pain, nausea, vomiting, diarrhea EXAM: CT ABDOMEN AND PELVIS WITHOUT CONTRAST TECHNIQUE: Multidetector CT imaging of the abdomen and pelvis was performed following the standard protocol without IV contrast. Unenhanced CT was performed per clinician order. Lack of IV contrast limits sensitivity and specificity, especially for evaluation of abdominal/pelvic solid viscera. RADIATION DOSE REDUCTION: This exam was performed according to the departmental dose-optimization program which includes automated exposure control, adjustment of the mA and/or kV according to patient size and/or use of iterative reconstruction technique. COMPARISON:  12/08/2021 FINDINGS: Lower chest: Minimal dependent hypoventilatory changes. No acute pleural or parenchymal lung disease. Hepatobiliary: Unremarkable unenhanced appearance of the liver and gallbladder. Pancreas: Unremarkable unenhanced appearance. Spleen: Unremarkable unenhanced appearance. Adrenals/Urinary Tract: Right lower quadrant transplant kidney is identified, with no urinary tract calculi or signs of obstruction. No perinephric fluid collection. The native kidneys are markedly atrophic, stable. The adrenals and bladder are unremarkable. Stomach/Bowel: No bowel obstruction or ileus. Normal appendix right lower quadrant. There is mild wall thickening and pericolonic fat stranding involving the ascending and transverse colon, consistent with inflammatory or infectious colitis. Scattered gas fluid levels  throughout the large and small bowel consistent with history of diarrheal illness. Small hiatal hernia. Vascular/Lymphatic: No significant vascular findings on this unenhanced exam. No pathologic adenopathy. Reproductive: Prostate is unremarkable. Other: No free fluid or free intraperitoneal gas. No abdominal wall hernia. Musculoskeletal: No acute or destructive bony abnormalities. Reconstructed images demonstrate no additional findings. IMPRESSION: 1. Wall thickening and pericolonic fat stranding involving the proximal colon, consistent with inflammatory or infectious colitis. 2. Scattered gas fluid levels throughout the large and small bowel consistent with diarrheal illness. No obstruction or ileus. 3. Unremarkable right lower quadrant transplant kidney. 4. Small hiatal hernia. Electronically Signed   By: Bobbye Burrow M.D.   On: 04/07/2024 09:28     Labs:   Basic Metabolic Panel: Recent Labs  Lab 04/07/24 0618 04/08/24 0458  NA 139 131*  K 4.1 3.7  CL 109 109  CO2 17* 16*  GLUCOSE 162* 97  BUN 18 26*  CREATININE 1.92* 2.90*  CALCIUM  9.6 7.6*   GFR Estimated Creatinine Clearance: 32.1 mL/min (A) (by C-G formula based on SCr of 2.9 mg/dL (H)). Liver Function Tests: Recent Labs  Lab 04/07/24 0618  AST 18  ALT 23  ALKPHOS 56  BILITOT 0.5  PROT 6.9  ALBUMIN 4.2   Recent Labs  Lab 04/07/24 0618  LIPASE 44   No results for input(s): "AMMONIA" in the last 168 hours. Coagulation profile No results for input(s): "INR", "PROTIME" in the last 168 hours.  CBC: Recent Labs  Lab 04/07/24 0618 04/08/24 0458  WBC 13.9* 9.9  HGB 13.5 12.6*  HCT 40.0 39.6  MCV 95.7 100.3*  PLT 176 147*   Cardiac Enzymes: No results for input(s): "CKTOTAL", "CKMB", "CKMBINDEX", "TROPONINI" in the last  168 hours. BNP: Invalid input(s): "POCBNP" CBG: No results for input(s): "GLUCAP" in the last 168 hours. D-Dimer No results for input(s): "DDIMER" in the last 72 hours. Hgb A1c No results  for input(s): "HGBA1C" in the last 72 hours. Lipid Profile No results for input(s): "CHOL", "HDL", "LDLCALC", "TRIG", "CHOLHDL", "LDLDIRECT" in the last 72 hours. Thyroid  function studies No results for input(s): "TSH", "T4TOTAL", "T3FREE", "THYROIDAB" in the last 72 hours.  Invalid input(s): "FREET3" Anemia work up No results for input(s): "VITAMINB12", "FOLATE", "FERRITIN", "TIBC", "IRON", "RETICCTPCT" in the last 72 hours. Microbiology Recent Results (from the past 240 hours)  Resp panel by RT-PCR (RSV, Flu A&B, Covid) Urine, Clean Catch     Status: None   Collection Time: 04/07/24  9:08 AM   Specimen: Urine, Clean Catch; Nasal Swab  Result Value Ref Range Status   SARS Coronavirus 2 by RT PCR NEGATIVE NEGATIVE Final    Comment: (NOTE) SARS-CoV-2 target nucleic acids are NOT DETECTED.  The SARS-CoV-2 RNA is generally detectable in upper respiratory specimens during the acute phase of infection. The lowest concentration of SARS-CoV-2 viral copies this assay can detect is 138 copies/mL. A negative result does not preclude SARS-Cov-2 infection and should not be used as the sole basis for treatment or other patient management decisions. A negative result may occur with  improper specimen collection/handling, submission of specimen other than nasopharyngeal swab, presence of viral mutation(s) within the areas targeted by this assay, and inadequate number of viral copies(<138 copies/mL). A negative result must be combined with clinical observations, patient history, and epidemiological information. The expected result is Negative.  Fact Sheet for Patients:  BloggerCourse.com  Fact Sheet for Healthcare Providers:  SeriousBroker.it  This test is no t yet approved or cleared by the [place]  FDA and  has been authorized for detection and/or diagnosis of SARS-CoV-2 by FDA under an Emergency Use Authorization (EUA). This EUA will  remain  in effect (meaning this test can be used) for the duration of the COVID-19 declaration under Section 564(b)(1) of the Act, 21 U.S.C.section 360bbb-3(b)(1), unless the authorization is terminated  or revoked sooner.       Influenza A by PCR NEGATIVE NEGATIVE Final   Influenza B by PCR NEGATIVE NEGATIVE Final    Comment: (NOTE) The Xpert Xpress SARS-CoV-2/FLU/RSV plus assay is intended as an aid in the diagnosis of influenza from Nasopharyngeal swab specimens and should not be used as a sole basis for treatment. Nasal washings and aspirates are unacceptable for Xpert Xpress SARS-CoV-2/FLU/RSV testing.  Fact Sheet for Patients: BloggerCourse.com  Fact Sheet for Healthcare Providers: SeriousBroker.it  This test is not yet approved or cleared by the [place]  FDA and has been authorized for detection and/or diagnosis of SARS-CoV-2 by FDA under an Emergency Use Authorization (EUA). This EUA will remain in effect (meaning this test can be used) for the duration of the COVID-19 declaration under Section 564(b)(1) of the Act, 21 U.S.C. section 360bbb-3(b)(1), unless the authorization is terminated or revoked.     Resp Syncytial Virus by PCR NEGATIVE NEGATIVE Final    Comment: (NOTE) Fact Sheet for Patients: BloggerCourse.com  Fact Sheet for Healthcare Providers: SeriousBroker.it  This test is not yet approved or cleared by the [place]  FDA and has been authorized for detection and/or diagnosis of SARS-CoV-2 by FDA under an Emergency Use Authorization (EUA). This EUA will remain in effect (meaning this test can be used) for the duration of the COVID-19 declaration under Section 564(b)(1) of the Act,  21 U.S.C. section 360bbb-3(b)(1), unless the authorization is terminated or revoked.  Performed at [facility], [address] [place],  [place] [address]   Culture, blood (Routine X 2) w Reflex to ID Panel     Status: None (Preliminary result)   Collection Time: 04/08/24  4:58 AM   Specimen: BLOOD RIGHT HAND  Result Value Ref Range Status   Specimen Description   Final    BLOOD RIGHT HAND Performed at [facility], [address], [place], [place] [address]    Special Requests   Final    BOTTLES DRAWN AEROBIC AND ANAEROBIC Blood Culture results may not be optimal due to an inadequate volume of blood received in culture bottles Performed at [facility], [address] [place], [place] [address]    Culture   Final    NO GROWTH <12 HOURS Performed at [facility], [address] [place], [place] [address]    Report Status PENDING  Incomplete  Culture, blood (Routine X 2) w Reflex to ID Panel     Status: None (Preliminary result)   Collection Time: 04/08/24  4:58 AM   Specimen: BLOOD RIGHT ARM  Result Value Ref Range Status   Specimen Description   Final    BLOOD RIGHT ARM Performed at [facility], [address], [place], [place] [address]    Special Requests   Final    BOTTLES DRAWN AEROBIC AND ANAEROBIC Blood Culture results may not be optimal due to an inadequate volume of blood received in culture bottles Performed at [facility], [address], [place], [place] [address]    Culture   Final    NO GROWTH <12 HOURS Performed at [facility], [address], [place], [place] [address]    Report Status PENDING  Incomplete  Gastrointestinal Panel by PCR , Stool     Status: Abnormal   Collection Time: 04/08/24  6:54 AM   Specimen: Stool  Result Value Ref Range Status   Campylobacter species NOT DETECTED NOT DETECTED Final   Plesimonas shigelloides NOT DETECTED NOT DETECTED Final   Salmonella species NOT DETECTED NOT DETECTED Final   Yersinia enterocolitica NOT DETECTED NOT DETECTED Final   Vibrio species NOT DETECTED NOT DETECTED Final   Vibrio  cholerae NOT DETECTED NOT DETECTED Final   Enteroaggregative E coli (EAEC) NOT DETECTED NOT DETECTED Final   Enteropathogenic E coli (EPEC) NOT DETECTED NOT DETECTED Final   Enterotoxigenic E coli (ETEC) NOT DETECTED NOT DETECTED Final   Shiga like toxin producing E coli (STEC) NOT DETECTED NOT DETECTED Final   Shigella/Enteroinvasive E coli (EIEC) DETECTED (A) NOT DETECTED Final    Comment: RESULT CALLED TO, READ BACK BY AND VERIFIED WITH: HILLARY MASHBURN RN 1419 04/08/24 HNM    Cryptosporidium NOT DETECTED NOT DETECTED Final   Cyclospora cayetanensis NOT DETECTED NOT DETECTED Final   Entamoeba histolytica NOT DETECTED NOT DETECTED Final   Giardia lamblia NOT DETECTED NOT DETECTED Final   Adenovirus F40/41 NOT DETECTED NOT DETECTED Final   Astrovirus NOT DETECTED NOT DETECTED Final   Norovirus GI/GII NOT DETECTED NOT DETECTED Final   Rotavirus A NOT DETECTED NOT DETECTED Final   Sapovirus (I, II, IV, and V) NOT DETECTED NOT DETECTED Final    Comment: Performed at [facility], [address] [place], [place] [address]  C Difficile Quick Screen w PCR reflex     Status: None   Collection  Time: 04/08/24 11:06 AM   Specimen: STOOL  Result Value Ref Range Status   C Diff antigen NEGATIVE NEGATIVE Final   C Diff toxin NEGATIVE NEGATIVE Final   C Diff interpretation No C. difficile detected.  Final    Comment: Performed at [facility], [address], [place], [place] [address]    Time coordinating discharge: 45 minutes  Signed: Aminta Baldy Sanjeev Main  [facility] 04/08/2024, 5:03 PM

## 2024-04-08 NOTE — Progress Notes (Signed)
 MD notified that patient tested positive for Shigella/Enteroinvasive E coli (EIEC). MD placed new orders.

## 2024-04-08 NOTE — Progress Notes (Signed)
   04/08/24 1006  Assess: MEWS Score  Temp 99.9 F (37.7 C)  BP 119/82  Pulse Rate (!) 126  ECG Heart Rate (!) 126  Resp 20  Level of Consciousness Alert  SpO2 95 %  O2 Device Room Air  Patient Activity (if Appropriate) In bed  Assess: MEWS Score  MEWS Temp 0  MEWS Systolic 0  MEWS Pulse 2  MEWS RR 0  MEWS LOC 0  MEWS Score 2  MEWS Score Color Yellow  Assess: if the MEWS score is Yellow or Red  Were vital signs accurate and taken at a resting state? Yes  Does the patient meet 2 or more of the SIRS criteria? No  MEWS guidelines implemented  No, previously yellow, continue vital signs every 4 hours  Notify: Charge Nurse/RN  Name of Charge Nurse/RN Notified Otilio Block, RN  Assess: SIRS CRITERIA  SIRS Temperature  0  SIRS Respirations  0  SIRS Pulse 1  SIRS WBC 0  SIRS Score Sum  1

## 2024-04-08 NOTE — Care Management CC44 (Signed)
 Condition Code 44 Documentation Completed  Patient Details  Name: Dylan Barber. MRN: [id number] Date of Birth: [birth year]   Condition Code 44 given:  Yes Patient signature on Condition Code 44 notice:  Yes Documentation of 2 MD's agreement:  Yes Code 44 added to claim:  Yes    Bill Mcvey, RN 04/08/2024, 6:03 PM

## 2024-04-09 DIAGNOSIS — Z940 Kidney transplant status: Secondary | ICD-10-CM

## 2024-04-09 DIAGNOSIS — N189 Chronic kidney disease, unspecified: Secondary | ICD-10-CM

## 2024-04-09 DIAGNOSIS — B20 Human immunodeficiency virus [HIV] disease: Secondary | ICD-10-CM | POA: Diagnosis not present

## 2024-04-09 DIAGNOSIS — A042 Enteroinvasive Escherichia coli infection: Secondary | ICD-10-CM | POA: Diagnosis not present

## 2024-04-09 DIAGNOSIS — N179 Acute kidney failure, unspecified: Secondary | ICD-10-CM | POA: Diagnosis not present

## 2024-04-09 DIAGNOSIS — B181 Chronic viral hepatitis B without delta-agent: Secondary | ICD-10-CM

## 2024-04-09 LAB — BASIC METABOLIC PANEL WITH GFR
Anion gap: 6 (ref 5–15)
BUN: 34 mg/dL — ABNORMAL HIGH (ref 6–20)
CO2: 17 mmol/L — ABNORMAL LOW (ref 22–32)
Calcium: 8 mg/dL — ABNORMAL LOW (ref 8.9–10.3)
Chloride: 114 mmol/L — ABNORMAL HIGH (ref 98–111)
Creatinine, Ser: 2.91 mg/dL — ABNORMAL HIGH (ref 0.61–1.24)
GFR, Estimated: 25 mL/min — ABNORMAL LOW (ref >60)
Glucose, Bld: 92 mg/dL (ref 70–99)
Potassium: 4 mmol/L (ref 3.5–5.1)
Sodium: 137 mmol/L (ref 135–145)

## 2024-04-09 LAB — CBC WITH DIFFERENTIAL/PLATELET
Abs Immature Granulocytes: 0.11 10*3/uL — ABNORMAL HIGH (ref 0.00–0.07)
Basophils Absolute: 0 10*3/uL (ref 0.0–0.1)
Basophils Relative: 0 %
Eosinophils Absolute: 0 10*3/uL (ref 0.0–0.5)
Eosinophils Relative: 0 %
HCT: 37.3 % — ABNORMAL LOW (ref 39.0–52.0)
Hemoglobin: 12.6 g/dL — ABNORMAL LOW (ref 13.0–17.0)
Immature Granulocytes: 1 %
Lymphocytes Relative: 9 %
Lymphs Abs: 0.8 10*3/uL (ref 0.7–4.0)
MCH: 33.4 pg (ref 26.0–34.0)
MCHC: 33.8 g/dL (ref 30.0–36.0)
MCV: 98.9 fL (ref 80.0–100.0)
Monocytes Absolute: 1.1 10*3/uL — ABNORMAL HIGH (ref 0.1–1.0)
Monocytes Relative: 11 %
Neutro Abs: 7.6 10*3/uL (ref 1.7–7.7)
Neutrophils Relative %: 79 %
Platelets: 145 10*3/uL — ABNORMAL LOW (ref 150–400)
RBC: 3.77 MIL/uL — ABNORMAL LOW (ref 4.22–5.81)
RDW: 13.4 % (ref 11.5–15.5)
Smear Review: NORMAL
WBC: 9.7 10*3/uL (ref 4.0–10.5)
nRBC: 0 % (ref 0.0–0.2)

## 2024-04-09 LAB — CREATININE, URINE, RANDOM: Creatinine, Urine: 454 mg/dL

## 2024-04-09 LAB — SODIUM, URINE, RANDOM: Sodium, Ur: 59 mmol/L

## 2024-04-09 MED ORDER — SODIUM CHLORIDE 0.9 % IV SOLN
500.0000 mg | INTRAVENOUS | Status: DC
  Administered 2024-04-09: 500 mg via INTRAVENOUS
  Filled 2024-04-09 (×2): qty 5

## 2024-04-09 MED ORDER — SODIUM CHLORIDE 0.9 % IV SOLN: 2.0000 g | INTRAVENOUS | Status: DC

## 2024-04-09 MED ORDER — SODIUM CHLORIDE 0.9 % IV SOLN: INTRAVENOUS | Status: AC

## 2024-04-09 NOTE — Progress Notes (Addendum)
 [facility] Progress Note  Subjective:  Seen in room No UOP collected overnight Knows to collect and measure now Creat 2.9 again today Making good UOP per pt  Vitals:   04/09/24 0434 04/09/24 0500 04/09/24 0751 04/09/24 1155  BP: 125/87  120/84 109/75  Pulse: (!) 103  98 99  Resp: 20   (!) 21  Temp: 98.4 F (36.9 C)   99.2 F (37.3 C)  TempSrc: Oral   Oral  SpO2: 96%   98%  Weight:  88.1 kg    Height:        Exam: Gen alert, no distress No rash, cyanosis or gangrene Sclera anicteric, throat clear  No jvd or bruits Chest clear bilat to bases, no rales/ wheezing RRR no MRG Abd soft ntnd no mass or ascites +bs GU nl male MS no joint effusions or deformity Ext no LE or UE edema, no other edema Neuro is alert, Ox 3 , nf      Renal-related home meds: Coreg  25 bid Pred 5 qam Prograf  5 mg bid Myfortic 720 mg bid Sod bicarb 650 daily Bactrim  400-80mg  1 tab mwf   UA - negative UNa 59, UCr 454 Renal US  transplant: normal appearing kidney, no hydro       Assessment/ Plan: 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 Presented w/ creat 1.9 after 2 day illness w/ sig acute diarrhea at home. BP's were high on arrival here, then dropped a few hrs later into 100/70s range (only ED meds given prior to that were IV fentanyl  and IV morphine ). The next day creat bumped up to 2.9. No IV contrast, no acei/ ARB involved. Suspect AKI due to hypotension related to GI infection sepsis picture +/- hypovolemia from diarrheal losses. Pt rec'd another 2 L NS bolus overnight and IVF's were continued at 150 cc/hr. Today creat stable at 2.9. UOP not recorded but no signs of vol overload. F/u creat in am. Will follow. If stable/ improving tomorrow should be ok to dc.  HTN: takes coreg  25 bid at home. Want to avoid hypotension in this acute setting so Coreg  lowered to 12.5 bid temporarily w/ directions not to give if SBP < 130.  Renal transplant: cont  myfortic, pred and prograf  at usual dosing.  Acute gastroenteritis: per GI panel, + for enteroinvasive E coli.  HIV          Larry Poag MD  [facility] 04/09/2024, 2:05 PM  Recent Labs  Lab 04/07/24 0618 04/08/24 0458 04/09/24 0429  HGB 13.5 12.6* 12.6*  ALBUMIN 4.2  --   --   CALCIUM  9.6 7.6* 8.0*  CREATININE 1.92* 2.90* 2.91*  K 4.1 3.7 4.0   No results for input(s): "IRON", "TIBC", "FERRITIN" in the last 168 hours. Inpatient medications:  bictegravir-emtricitabine-tenofovir AF  1 tablet Oral QHS   carvedilol   12.5 mg Oral BID WC   loratadine  10 mg Oral Daily   mycophenolate  720 mg Oral BID   predniSONE   5 mg Oral Q breakfast   sodium bicarbonate  650 mg Oral Daily   sulfamethoxazole -trimethoprim   1 tablet Oral Q M,W,F   tacrolimus   5 mg Oral BID    sodium chloride  150 mL/hr at 04/09/24 1013   cefTRIAXone  (ROCEPHIN )  IV     acetaminophen  **OR** acetaminophen , albuterol , HYDROmorphone  (DILAUDID ) injection, ondansetron  **OR** ondansetron  (ZOFRAN ) IV, mouth rinse, oxyCODONE , traZODone

## 2024-04-09 NOTE — Consult Note (Signed)
 [facility]  Total days of antibiotics 3         Reason for Consult:diarrhea    Referring Physician: dahal  Principal Problem:   AKI (acute kidney injury) (HCC) Active Problems:   Gastroenteritis    HPI: Dylan Popov. is a 48 y.o. 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 ACDF in 2024, his IS includes tacro 5 bid, Myfortic 720 bid, pred 5mg . He reports being in good health up until Tuesday 5/27, he started to have watery diarrhea with urgency, no blood and overnight increasing abdominal cramping. He attempted to go to work the next day but continued to feel unwell with chills and nausea. He presented to the ED on 5/28 for evaluation of his symptoms. On admit he was febrile with temp 100.31F, tachycardic 117, WBC elevated at 14K, bicarb at 17 cr mildly elevated at 1.9. his abdominal scan showed signs concerning for colitis. He was started on iv fluids, and supportive care. His Stool studies on 5/29 showed Enteroinvasive Ecoli and thus started on meropenem to address infectious colitis. The patients wbc improved now WNL but his kidney function worsened to 2.9.    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 He had sushi on 4 nights prior to onset of symptoms, did include raw fish, and rolls that were wrapped in cucumber.  He reports that he has had previous hx of diarrhea illness requiring hospitalization 2/2 giardiasis and sapovirus, also in the setting of covid in march 2020- cr peaked at 4.   He is still having perfuse diarrhea, less abdominal cramping, no fevers. No N or vomiting. He is concerned about this cr function not improving.  Renal consult team also following the patient  Past Medical History:  Diagnosis Date   Anemia    ESRD (end stage renal  disease) (HCC)    Heart murmur    HIV disease (HCC)    Hypertension    Lymphocytosis 06/16/2014   Noncompliance 03/06/2015   Post herpetic neuralgia 11/25/2016   Left V1 distribution   Seizure (HCC)    Transplanted kidney    in 2019    Allergies:  Allergies  Allergen Reactions   Codeine Itching   Egg-Derived Products     Shortness of breath   Heparin  Itching    Severe itching   Mercury     Childhood allergy   Tomato Itching    MEDICATIONS:  bictegravir-emtricitabine-tenofovir AF  1 tablet Oral QHS   carvedilol   12.5 mg Oral BID WC   loratadine  10 mg Oral Daily   mycophenolate  720 mg Oral BID   predniSONE   5 mg Oral Q breakfast   sodium bicarbonate  650 mg Oral Daily   sulfamethoxazole -trimethoprim   1 tablet Oral Q M,W,F   tacrolimus   5 mg Oral BID    Social History   Tobacco Use   Smoking status: Never   Smokeless tobacco: Never  Vaping Use   Vaping status: Never Used  Substance Use Topics   Alcohol use: Not Currently   Drug use: No    Family History  Problem Relation Age of Onset   Kidney failure Mother    Colon polyps Father    Kidney disease Paternal Uncle    Kidney  disease Maternal Grandfather    Kidney failure Maternal Uncle     Review of Systems - 12 point ros is negative except what is mentioned above   OBJECTIVE: Temp:  [98.4 F (36.9 C)-99.5 F (37.5 C)] 99.2 F (37.3 C) (05/30 1155) Pulse Rate:  [98-115] 99 (05/30 1155) Resp:  [18-21] 21 (05/30 1155) BP: (103-129)/(69-87) 109/75 (05/30 1155) SpO2:  [94 %-100 %] 98 % (05/30 1155) Weight:  [88.1 kg] 88.1 kg (05/30 0500) Physical Exam  Constitutional: He is oriented to person, place, and time. He appears well-developed and well-nourished. No distress.  HENT:  Mouth/Throat: Oropharynx is clear and moist. No oropharyngeal exudate.  Cardiovascular: Normal rate, regular rhythm and normal heart sounds. Exam reveals no gallop and no friction rub.  No murmur heard.  Pulmonary/Chest:  Effort normal and breath sounds normal. No respiratory distress. He has no wheezes.  Abdominal: Soft. Bowel sounds are decreased. Mild distension. There is no tenderness.  Lymphadenopathy:  He has no cervical adenopathy.  Neurological: He is alert and oriented to person, place, and time.  Skin: Skin is warm and dry. No rash noted. No erythema.  Psychiatric: He has a normal mood and affect. His behavior is normal.    LABS: Results for orders placed or performed during the hospital encounter of 04/07/24 (from the past 48 hours)  Basic metabolic panel     Status: Abnormal   Collection Time: 04/08/24  4:58 AM  Result Value Ref Range   Sodium 131 (L) 135 - 145 mmol/L   Potassium 3.7 3.5 - 5.1 mmol/L   Chloride 109 98 - 111 mmol/L   CO2 16 (L) 22 - 32 mmol/L   Glucose, Bld 97 70 - 99 mg/dL    Comment: Glucose reference range applies only to samples taken after fasting for at least 8 hours.   BUN 26 (H) 6 - 20 mg/dL   Creatinine, Ser 1.47 (H) 0.61 - 1.24 mg/dL   Calcium  7.6 (L) 8.9 - 10.3 mg/dL   GFR, Estimated 26 (L) >60 mL/min    Comment: (NOTE) Calculated using the CKD-EPI Creatinine Equation (2021)    Anion gap 6 5 - 15    Comment: Performed at [facility], [address], [place], [place] [address]  CBC     Status: Abnormal   Collection Time: 04/08/24  4:58 AM  Result Value Ref Range   WBC 9.9 4.0 - 10.5 K/uL   RBC 3.95 (L) 4.22 - 5.81 MIL/uL   Hemoglobin 12.6 (L) 13.0 - 17.0 g/dL   HCT 21.3 08.6 - 57.8 %   MCV 100.3 (H) 80.0 - 100.0 fL   MCH 31.9 26.0 - 34.0 pg   MCHC 31.8 30.0 - 36.0 g/dL   RDW 46.9 62.9 - 52.8 %   Platelets 147 (L) 150 - 400 K/uL   nRBC 0.0 0.0 - 0.2 %    Comment: Performed at [facility], [address] [place], [place] [address]  Culture, blood (Routine X 2) w Reflex to ID Panel     Status: None (Preliminary result)   Collection Time: 04/08/24  4:58 AM   Specimen: BLOOD RIGHT HAND  Result Value Ref Range    Specimen Description      BLOOD RIGHT HAND Performed at [facility], [address] [place], [place] [address]    Special Requests      BOTTLES DRAWN AEROBIC AND ANAEROBIC Blood Culture results may not be optimal due to an inadequate  volume of blood received in culture bottles Performed at [facility], [address], [place], [place] [address]    Culture      NO GROWTH 1 DAY Performed at [facility], [address] [place], [place] [address]    Report Status PENDING   Culture, blood (Routine X 2) w Reflex to ID Panel     Status: None (Preliminary result)   Collection Time: 04/08/24  4:58 AM   Specimen: BLOOD RIGHT ARM  Result Value Ref Range   Specimen Description      BLOOD RIGHT ARM Performed at [facility], [address] [place], [place] [address]    Special Requests      BOTTLES DRAWN AEROBIC AND ANAEROBIC Blood Culture results may not be optimal due to an inadequate volume of blood received in culture bottles Performed at [facility], [address], [place], [place] [address]    Culture      NO GROWTH 1 DAY Performed at [facility], [address], [place], [place] [address]    Report Status PENDING   Gastrointestinal Panel by PCR , Stool     Status: Abnormal   Collection Time: 04/08/24  6:54 AM   Specimen: Stool  Result Value Ref Range   Campylobacter species NOT DETECTED NOT DETECTED   Plesimonas shigelloides NOT DETECTED NOT DETECTED   Salmonella species NOT DETECTED NOT DETECTED   Yersinia enterocolitica NOT DETECTED NOT DETECTED   Vibrio species NOT DETECTED NOT DETECTED   Vibrio cholerae NOT DETECTED NOT DETECTED   Enteroaggregative E coli (EAEC) NOT DETECTED NOT DETECTED   Enteropathogenic E coli (EPEC) NOT DETECTED NOT DETECTED   Enterotoxigenic E coli (ETEC) NOT DETECTED NOT DETECTED   Shiga like toxin producing E coli (STEC) NOT DETECTED NOT DETECTED   Shigella/Enteroinvasive E coli  (EIEC) DETECTED (A) NOT DETECTED    Comment: RESULT CALLED TO, READ BACK BY AND VERIFIED WITH: HILLARY MASHBURN RN 1419 04/08/24 HNM    Cryptosporidium NOT DETECTED NOT DETECTED   Cyclospora cayetanensis NOT DETECTED NOT DETECTED   Entamoeba histolytica NOT DETECTED NOT DETECTED   Giardia lamblia NOT DETECTED NOT DETECTED   Adenovirus F40/41 NOT DETECTED NOT DETECTED   Astrovirus NOT DETECTED NOT DETECTED   Norovirus GI/GII NOT DETECTED NOT DETECTED   Rotavirus A NOT DETECTED NOT DETECTED   Sapovirus (I, II, IV, and V) NOT DETECTED NOT DETECTED    Comment: Performed at [facility], [address] [place], [place] [address]  C Difficile Quick Screen w PCR reflex     Status: None   Collection Time: 04/08/24 11:06 AM   Specimen: STOOL  Result Value Ref Range   C Diff antigen NEGATIVE NEGATIVE   C Diff toxin NEGATIVE NEGATIVE   C Diff interpretation No C. difficile detected.     Comment: Performed at [facility], [address], [place], [place] [address]  Creatinine, urine, random     Status: None   Collection Time: 04/08/24 11:41 PM  Result Value Ref Range   Creatinine, Urine 454 mg/dL    Comment: RESULT CONFIRMED BY MANUAL DILUTION Performed at [facility], [address] [place], [place] [address]   Sodium, urine, random     Status: None   Collection Time: 04/08/24 11:41 PM  Result Value Ref Range   Sodium, Ur 59 mmol/L    Comment: Performed at [facility], [address] [place], [place] [address]  Basic metabolic panel with GFR     Status: Abnormal   Collection Time: 04/09/24  4:29 AM  Result Value Ref Range   Sodium 137 135 - 145 mmol/L   Potassium 4.0 3.5 - 5.1 mmol/L   Chloride 114 (H) 98 - 111 mmol/L   CO2 17 (L) 22 - 32 mmol/L   Glucose, Bld 92 70 - 99 mg/dL    Comment: Glucose reference range applies only to samples taken after fasting for at least 8 hours.   BUN 34 (H) 6 - 20 mg/dL    Creatinine, Ser 6.04 (H) 0.61 - 1.24 mg/dL   Calcium  8.0 (L) 8.9 - 10.3 mg/dL   GFR, Estimated 25 (L) >60 mL/min    Comment: (NOTE) Calculated using the CKD-EPI Creatinine Equation (2021)    Anion gap 6 5 - 15    Comment: Performed at [facility], [address], [place], [place] [address]  CBC with Differential/Platelet     Status: Abnormal   Collection Time: 04/09/24  4:29 AM  Result Value Ref Range   WBC 9.7 4.0 - 10.5 K/uL   RBC 3.77 (L) 4.22 - 5.81 MIL/uL   Hemoglobin 12.6 (L) 13.0 - 17.0 g/dL   HCT 11.9 (L) 14.7 - 82.9 %   MCV 98.9 80.0 - 100.0 fL   MCH 33.4 26.0 - 34.0 pg   MCHC 33.8 30.0 - 36.0 g/dL   RDW 56.2 13.0 - 86.5 %   Platelets 145 (L) 150 - 400 K/uL   nRBC 0.0 0.0 - 0.2 %   Neutrophils Relative % 79 %   Neutro Abs 7.6 1.7 - 7.7 K/uL   Lymphocytes Relative 9 %   Lymphs Abs 0.8 0.7 - 4.0 K/uL   Monocytes Relative 11 %   Monocytes Absolute 1.1 (H) 0.1 - 1.0 K/uL   Eosinophils Relative 0 %   Eosinophils Absolute 0.0 0.0 - 0.5 K/uL   Basophils Relative 0 %   Basophils Absolute 0.0 0.0 - 0.1 K/uL   WBC Morphology Mild Left Shift (1-5% metas, occ myelo)     Comment: TOXIC GRANULATION   RBC Morphology MORPHOLOGY UNREMARKABLE    Smear Review Normal platelet morphology    Immature Granulocytes 1 %   Abs Immature Granulocytes 0.11 (H) 0.00 - 0.07 K/uL    Comment: Performed at [facility], [address], [place], [place] [address]    MICRO: Blood cx ngtd IMAGING: US  Renal Transplant w/Doppler Result Date: 04/08/2024 CLINICAL DATA:  Acute renal failure, stage III A chronic kidney disease. History of renal transplant. EXAM: ULTRASOUND OF RENAL TRANSPLANT WITH RENAL DOPPLER ULTRASOUND TECHNIQUE: Ultrasound examination of the renal transplant was performed with gray-scale, color and duplex doppler evaluation. COMPARISON:  None Available. FINDINGS: Transplant kidney location: Right lower quadrant Transplant Kidney: Renal  measurements: 9.9 x 6.2 x 5.5 cm = volume: . Normal in size and parenchymal echogenicity. No evidence of mass or hydronephrosis. No peri-transplant fluid collection seen. Color flow in the main renal artery:  Yes Color flow in the main renal vein:  Yes Duplex Doppler Evaluation: Main Renal Artery Velocity:  cm/sec Main Renal Artery Resistive Index: 0.86 Venous waveform in main renal vein:  Present Intrarenal resistive index in upper pole:  0.63 (normal 0.6-0.8; equivocal 0.8-0.9; abnormal >= 0.9) Intrarenal resistive index in lower pole: 0.73 (normal 0.6-0.8; equivocal 0.8-0.9; abnormal >= 0.9) Bladder: Normal for degree of bladder distention. Other findings:  None. IMPRESSION: Right lower quadrant  renal transplant grossly unremarkable. No hydronephrosis. Electronically Signed   By: Janeece Mechanic M.D.   On: 04/08/2024 21:22   Assessment/Plan:  48yo M with HIV-chronic Hep B on biktarvy, and hx of renal transplant in 2019 with baseline cr of 1.75 admitted for infectious colitis with mild metabolic acidosis due to diarrhea and acute on chronic kidney disease Continue on bitkarvy, no need to change for renal function at this time Acute on chronic kidney disease =renal ultrasound does not show any insult to transplanted kidney. Continue with IVF to minimize pre-renal azotemia and added sodium bicarb Diarrhea = may consider checking magnesium with bmp tomorrow to see if any electrolyte repletion is needed Oi proph = continue on bactrim  single strength mwf Renal transplant = defer to renal to whether any changes needed at this time but suspect can correct kidney injury quickly EIEC colitis = will change abtx to azithromycin  500mg  iv daily x 3 days in addition to supportive care.  evaluation of this patient requires complex antimicrobial therapy evaluation and counseling and isolation needs for disease transmission risk assessment and mitigation.   I have personally spent 85  minutes involved in face-to-face  and non-face-to-face activities for this patient on the day of the visit. Professional time spent includes the following activities: Preparing to see the patient (review of tests), Obtaining and/or reviewing separately obtained history (admission/discharge record), Performing a medically appropriate examination and/or evaluation , Ordering medications, referring and communicating with other health care professionals, Documenting clinical information in the EMR, Independently interpreting results (not separately reported), Communicating results to the patient, Counseling and educating the patient and partner

## 2024-04-09 NOTE — Progress Notes (Signed)
 PROGRESS NOTE  Dylan Barber.  DOB: 01-20-1976  PCP: Chaneta Comer, MD ZOX:[id number]  DOA: 04/07/2024  LOS: 1 day  Hospital Day: 3  Brief narrative: Dylan Lortie. is a 48 y.o. male with PMH significant for HIV disease, renal transplant 2019, HTN, seizure. Patient follows up with nephrologist Dr. Jefrey Mink at [facility]. 5/28, patient presented to the ED with 2 days of crampy abdominal pain, nausea, vomiting, diarrhea, low PO intake.   In the ED, patient had a temperature of 100.3, heart rate 117, blood pressure 162/107, breathing on room air. Labs with WC count elevated to 13.9, BUN/creatinine 18/1.9 CT abdomen pelvis without contrast showed 1. Wall thickening and pericolonic fat stranding involving the proximal colon, consistent with inflammatory or infectious colitis. 2. Scattered gas fluid levels throughout the large and small bowel consistent with diarrheal illness. No obstruction or ileus.  Admitted to [facility] Started IV fluid Stool studies grew EIEC  Subjective: Patient was seen and examined this morning.   Propped up in bed.  Not in distress.  Abdominal pain improving but continues to have diarrhea. Labs from this with creatinine at 2.91 roughly as yesterday  Patient requested a transfer to [facility] yesterday. I started the process. They said they would take him if they had a bed. I prepared discharge just in case the bed opens up at night.  Patient is still here this morning. I will call the transfer center again today.  As long as he remains here, inpatient care continues. There is a high chance that he will improve while waiting and would not really need to transfer.   Assessment and plan: Acute gastroenteritis EIEC colitis Likely food poisoning versus viral C. difficile assay negative. GI pathogen panel was positive for EIEC Blood culture sent Abdominal pain improving but continues to have diarrhea.  Currently on NS at 150 mL/h Based on stool culture  report of EIEC, and patient's immunocompromise status with HIV and renal transplant meds, patient has not started on IV meropenem  per 'OpenEvidence' guideline.  Will reach out to ID as well. Recent Labs  Lab 04/07/24 0618 04/08/24 0458 04/09/24 0429  WBC 13.9* 9.9 9.7   AKI on CKD 3a Acute metabolic acidosis Baseline creatinine 1.5-1.7.  Presented with creatinine elevated 1.92.  Worse at 2.9 today, bicarb level low at 16 likely due to ongoing diarrhea. Nephrology consult called to Dr. Zana Hesselbach Given IV fluid boluses and maintained on normal saline.  Target systolic blood pressure over 120.  Coreg  dose was reduced for that reason  Recent Labs    01/26/24 1837 04/07/24 0618 04/08/24 0458 04/09/24 0429  BUN 18 18 26* 34*  CREATININE 1.60* 1.92* 2.90* 2.91*  CO2 23 17* 16* 17*   Renal transplant status 2019 On immunosuppression with tacrolimus , mycophenolate  and prednisone . Continue all  HIV disease well-controlled continue Biktarvy  and Bactrim  as before   Hypertension On rate dose of Coreg  to maintain target blood pressure over 120 systolic    Neuropathy gabapentin  remains on hold    Mobility: Encourage ambulation  Goals of care   Code Status: Full Code    DVT prophylaxis:  SCDs Start: 04/07/24 1300   Antimicrobials: Biktarvy , Bactrim  MWF Fluid: NS at 150 mL/h Consultants: Nephrology Family Communication: Spouse not at bedside  Status: Inpatient Level of care:  Progressive   Patient is from: Home Needs to continue in-hospital care: Needs IV fluid, renal function monitoring.     Diet:  Diet Order  Diet general           Diet regular Room service appropriate? Yes; Fluid consistency: Thin  Diet effective now                   Scheduled Meds:  bictegravir-emtricitabine-tenofovir AF  1 tablet Oral QHS   carvedilol   12.5 mg Oral BID WC   loratadine  10 mg Oral Daily   mycophenolate  720 mg Oral BID   predniSONE   5 mg Oral Q breakfast    sodium bicarbonate  650 mg Oral Daily   sulfamethoxazole -trimethoprim   1 tablet Oral Q M,W,F   tacrolimus   5 mg Oral BID    PRN meds: acetaminophen  **OR** acetaminophen , albuterol , HYDROmorphone  (DILAUDID ) injection, ondansetron  **OR** ondansetron  (ZOFRAN ) IV, mouth rinse, oxyCODONE , traZODone   Infusions:   sodium chloride  150 mL/hr at 04/09/24 1013   meropenem (MERREM) IV 1 g (04/09/24 0935)    Antimicrobials: Anti-infectives (From admission, onward)    Start     Dose/Rate Route Frequency Ordered Stop   04/09/24 0000  meropenem 1 g in sodium chloride  0.9 % 100 mL        1 g Intravenous Every 12 hours 04/08/24 1703     04/08/24 1600  meropenem (MERREM) 1 g in sodium chloride  0.9 % 100 mL IVPB        1 g 200 mL/hr over 30 Minutes Intravenous Every 12 hours 04/08/24 1500     04/07/24 2200  bictegravir-emtricitabine-tenofovir AF (BIKTARVY) 50-200-25 MG per tablet 1 tablet        1 tablet Oral Daily at bedtime 04/07/24 1301     04/07/24 1700  sulfamethoxazole -trimethoprim  (BACTRIM ) 400-80 MG per tablet 1 tablet        1 tablet Oral Every M-W-F 04/07/24 1343     04/07/24 1400  abacavir  (ZIAGEN ) tablet 600 mg  Status:  Discontinued       Note to Pharmacy: TAKE 2 TABLETS BY MOUTH DAILY. STOP ZIDOVUDINE , STOP PREZCOBIX      600 mg Oral Daily 04/07/24 1301 04/07/24 1340   04/07/24 1400  lamivudine  (EPIVIR ) tablet 50 mg  Status:  Discontinued        50 mg Oral Daily 04/07/24 1301 04/07/24 1337       Objective: Vitals:   04/09/24 0751 04/09/24 1155  BP: 120/84 109/75  Pulse: 98 99  Resp:  (!) 21  Temp:  99.2 F (37.3 C)  SpO2:  98%    Intake/Output Summary (Last 24 hours) at 04/09/2024 1221 Last data filed at 04/09/2024 0400 Gross per 24 hour  Intake 1780.15 ml  Output --  Net 1780.15 ml   Filed Weights   04/07/24 1249 04/09/24 0500  Weight: 87.2 kg 88.1 kg   Weight change: 0.907 kg Body mass index is 30.42 kg/m.   Physical Exam: General exam: Pleasant, middle-aged  African-American male Skin: No rashes, lesions or ulcers. HEENT: Atraumatic, normocephalic, no obvious bleeding Lungs: Clear to auscultation bilaterally,  CVS: S1, S2, no murmur,   GI/Abd: Soft, has improving lower abdominal tenderness, nondistended, bowel sound present,   CNS: Alert, awake, oriented x 3 Psychiatry: Mood appropriate today Extremities: No pedal edema, no calf tenderness,   Data Review: I have personally reviewed the laboratory data and studies available.  F/u labs  Unresulted Labs (From admission, onward)     Start     Ordered   04/08/24 0654  Miscellaneous test (send-out)  Once,   R        04/08/24  1610           SignedHoyt Macleod, MD [facility] 04/09/2024

## 2024-04-10 DIAGNOSIS — N179 Acute kidney failure, unspecified: Secondary | ICD-10-CM | POA: Diagnosis not present

## 2024-04-10 LAB — CBC WITH DIFFERENTIAL/PLATELET
Abs Immature Granulocytes: 0.04 10*3/uL (ref 0.00–0.07)
Basophils Absolute: 0 10*3/uL (ref 0.0–0.1)
Basophils Relative: 0 %
Eosinophils Absolute: 0.1 10*3/uL (ref 0.0–0.5)
Eosinophils Relative: 1 %
HCT: 36.7 % — ABNORMAL LOW (ref 39.0–52.0)
Hemoglobin: 11.4 g/dL — ABNORMAL LOW (ref 13.0–17.0)
Immature Granulocytes: 1 %
Lymphocytes Relative: 22 %
Lymphs Abs: 1.9 10*3/uL (ref 0.7–4.0)
MCH: 31.4 pg (ref 26.0–34.0)
MCHC: 31.1 g/dL (ref 30.0–36.0)
MCV: 101.1 fL — ABNORMAL HIGH (ref 80.0–100.0)
Monocytes Absolute: 1.1 10*3/uL — ABNORMAL HIGH (ref 0.1–1.0)
Monocytes Relative: 12 %
Neutro Abs: 5.5 10*3/uL (ref 1.7–7.7)
Neutrophils Relative %: 64 %
Platelets: 134 10*3/uL — ABNORMAL LOW (ref 150–400)
RBC: 3.63 MIL/uL — ABNORMAL LOW (ref 4.22–5.81)
RDW: 13.6 % (ref 11.5–15.5)
WBC: 8.5 10*3/uL (ref 4.0–10.5)
nRBC: 0 % (ref 0.0–0.2)

## 2024-04-10 LAB — BASIC METABOLIC PANEL WITH GFR
Anion gap: 3 — ABNORMAL LOW (ref 5–15)
BUN: 27 mg/dL — ABNORMAL HIGH (ref 6–20)
CO2: 16 mmol/L — ABNORMAL LOW (ref 22–32)
Calcium: 8 mg/dL — ABNORMAL LOW (ref 8.9–10.3)
Chloride: 118 mmol/L — ABNORMAL HIGH (ref 98–111)
Creatinine, Ser: 2.04 mg/dL — ABNORMAL HIGH (ref 0.61–1.24)
GFR, Estimated: 39 mL/min — ABNORMAL LOW (ref >60)
Glucose, Bld: 92 mg/dL (ref 70–99)
Potassium: 4.2 mmol/L (ref 3.5–5.1)
Sodium: 137 mmol/L (ref 135–145)

## 2024-04-10 LAB — PHOSPHORUS: Phosphorus: 1.8 mg/dL — ABNORMAL LOW (ref 2.5–4.6)

## 2024-04-10 LAB — MAGNESIUM: Magnesium: 1.6 mg/dL — ABNORMAL LOW (ref 1.7–2.4)

## 2024-04-10 MED ORDER — MAGNESIUM SULFATE 2 GM/50ML IV SOLN
2.0000 g | Freq: Once | INTRAVENOUS | Status: AC
  Administered 2024-04-10: 2 g via INTRAVENOUS
  Filled 2024-04-10: qty 50

## 2024-04-10 MED ORDER — CARVEDILOL 12.5 MG PO TABS: 12.5000 mg | ORAL_TABLET | Freq: Two times a day (BID) | ORAL | Status: AC

## 2024-04-10 MED ORDER — K PHOS MONO-SOD PHOS DI & MONO 155-852-130 MG PO TABS: 500.0000 mg | ORAL_TABLET | Freq: Once | ORAL | Status: DC

## 2024-04-10 MED ORDER — AZITHROMYCIN 500 MG PO TABS: 500.0000 mg | ORAL_TABLET | Freq: Every day | ORAL | 0 refills | Status: AC

## 2024-04-10 MED ORDER — SODIUM PHOSPHATES 45 MMOLE/15ML IV SOLN
30.0000 mmol | Freq: Once | INTRAVENOUS | Status: DC
  Filled 2024-04-10: qty 10

## 2024-04-10 MED ORDER — K PHOS MONO-SOD PHOS DI & MONO 155-852-130 MG PO TABS
500.0000 mg | ORAL_TABLET | Freq: Once | ORAL | Status: AC
  Administered 2024-04-10: 500 mg via ORAL
  Filled 2024-04-10: qty 2

## 2024-04-10 NOTE — Progress Notes (Signed)
 [facility] Progress Note  Subjective:  Seen in room 950 cc UOP yest Creat down 2.0 today Diarrhea mostly resolved Pain resolved  Vitals:   04/09/24 2149 04/10/24 0542 04/10/24 0712 04/10/24 0802  BP: (!) 138/97 (!) 127/99  (!) 135/95  Pulse: (!) 103 91  86  Resp: 20 18    Temp: 98.8 F (37.1 C) 98.4 F (36.9 C)    TempSrc: Oral Oral    SpO2: 98% 99%    Weight:   88.8 kg   Height:        Exam: Gen alert, no distress No jvd or bruits Chest clear bilat to bases RRR no MRG Abd soft ntnd no mass or ascites +bs Ext no LE edema Neuro is alert, Ox 3 , nf      Renal-related home meds: Coreg  25 bid Pred 5 qam Prograf  5 mg bid Myfortic  720 mg bid Sod bicarb 650 daily Bactrim  400-80mg  1 tab mwf   UA - negative UNa 59, UCr 454 Renal US  transplant: normal appearing kidney, no hydro       Assessment/ Plan: 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 Presented w/ creat 1.9 after 2 day illness w/ sig acute diarrhea at home. BP's were high on arrival here, then dropped a few hrs later into 100/70s range (only ED meds given prior to that were IV fentanyl  and IV morphine ). The next day creat bumped up to 2.9. No IV contrast, no acei/ ARB involved. Suspected AKI due to hypotension related to septic picture +/- hypovolemia from diarrheal losses. Pt was given 2 L NS bolus and IVF's were continued at 150 cc/hr. Creat yest was again 2.9 and this am is down to 2.0, which is not far from b/l creatinine. Okay for dc from renal standpoint. Told him not to take coreg  unless SBP > 130 just for the next 1-2 wks. He has appt next week w/ his kidney doctor. No further suggestions, will sign off.  HTN: takes coreg  25 bid at home. See above for hold orders.  Renal transplant: cont myfortic , pred and prograf  at usual dosing.  Acute gastroenteritis: per GI panel, + for enteroinvasive E coli.  HIV          Larry Poag MD  [facility] 04/10/2024, 10:54  AM  Recent Labs  Lab 04/07/24 0618 04/08/24 0458 04/09/24 0429 04/10/24 0543 04/10/24 0548  HGB 13.5   < > 12.6*  --  11.4*  ALBUMIN 4.2  --   --   --   --   CALCIUM  9.6   < > 8.0*  --  8.0*  PHOS  --   --   --  1.8*  --   CREATININE 1.92*   < > 2.91*  --  2.04*  K 4.1   < > 4.0  --  4.2   < > = values in this interval not displayed.   No results for input(s): "IRON", "TIBC", "FERRITIN" in the last 168 hours. Inpatient medications:  bictegravir-emtricitabine -tenofovir  AF  1 tablet Oral QHS   carvedilol   12.5 mg Oral BID WC   loratadine   10 mg Oral Daily   mycophenolate   720 mg Oral BID   predniSONE   5 mg Oral Q breakfast   sodium bicarbonate   650 mg Oral Daily   sulfamethoxazole -trimethoprim   1 tablet Oral Q M,W,F   tacrolimus   5 mg Oral BID    azithromycin  Stopped (04/09/24 1605)   magnesium sulfate bolus  IVPB     sodium phosphate 30 mmol in sodium chloride  0.9 % 250 mL infusion     acetaminophen  **OR** acetaminophen , albuterol , HYDROmorphone  (DILAUDID ) injection, ondansetron  **OR** ondansetron  (ZOFRAN ) IV, mouth rinse, oxyCODONE , traZODone 

## 2024-04-10 NOTE — Discharge Summary (Signed)
 Physician Discharge Summary  Dylan Barber. UJW:[id number] DOB: January 21, 1976 DOA: 04/07/2024  PCP: Jefrey Mink I, MD  Admit date: 04/07/2024 Discharge date: 04/10/2024  Admitted From: Home Discharge disposition: Home  Recommendations at discharge:  Complete the course of antibiotics with 2 more days of azithromycin  Continue to monitor diarrhea Continue carvedilol  at reduced dose of 12.5 mg p.o. twice daily for next few days.  Based on your heart rate and blood pressure, may need to go back to original dose of 25 mg twice daily You have an appointment with your transplant nephrologist on Tuesday 6/3.  Brief narrative: Dylan Barber. is a 48 y.o. male with PMH significant for HIV disease, renal transplant 2019, HTN, seizure. Patient follows up with nephrologist Dr. Jefrey Mink at [facility]. 5/28, patient presented to the ED with 2 days of crampy abdominal pain, nausea, vomiting, diarrhea, low PO intake.   In the ED, patient had a temperature of 100.3, heart rate 117, blood pressure 162/107, breathing on room air. Labs with WC count elevated to 13.9, BUN/creatinine 18/1.9 CT abdomen pelvis without contrast showed 1. Wall thickening and pericolonic fat stranding involving the proximal colon, consistent with inflammatory or infectious colitis. 2. Scattered gas fluid levels throughout the large and small bowel consistent with diarrheal illness. No obstruction or ileus.  Admitted to [facility] Started IV fluid Stool studies grew EIEC  Subjective: Patient was seen and examined this morning.   Sitting up in bed.  Diarrhea much better.  Had 2 episodes overnight.  None since then. Creatinine better at 2.04 today.  Patient feels much better enough to go home today. Nephrology follow-up from this morning appreciated  Hospital course: Acute gastroenteritis EIEC colitis Likely food poisoning versus viral C. difficile assay negative. GI pathogen panel was positive for EIEC Blood  culture did not show any growth. Per ID recommendation, patient was started on 3 days of azithromycin .  Received 1 dose yesterday.  To continue for 2 more days at home to complete the course. Abdominal pain and diarrhea gradually improved.  Adequately hydrated Recent Labs  Lab 04/07/24 0618 04/08/24 0458 04/09/24 0429 04/10/24 0548  WBC 13.9* 9.9 9.7 8.5   AKI on CKD 3a Chronic metabolic acidosis Baseline creatinine 1.5-1.7.  Presented with creatinine elevated 1.92.  Worsened to peak at 2.91 on 5/30.  Nephrology consult appreciated.  Given aggressive IV hydration.   Creatinine gradually improving, 2.04 today. Continue sodium bicarb as before  Recent Labs    01/26/24 1837 04/07/24 0618 04/08/24 0458 04/09/24 0429 04/10/24 0548  BUN 18 18 26* 34* 27*  CREATININE 1.60* 1.92* 2.90* 2.91* 2.04*  CO2 23 17* 16* 17* 16*   Renal transplant status 2019 Continue immunosuppression as before with tacrolimus , mycophenolate  and prednisone .  Hypomagnesemia/hypophosphatemia Magnesium and phosphorus level were low this morning because of diarrhea. Replacement given. Recent Labs  Lab 04/07/24 0618 04/08/24 0458 04/09/24 0429 04/10/24 0543 04/10/24 0548  K 4.1 3.7 4.0  --  4.2  MG  --   --   --  1.6*  --   PHOS  --   --   --  1.8*  --    HIV disease well-controlled continue Biktarvy  and Bactrim  as before   Hypertension Continue carvedilol  at reduced dose of 12.5 mg p.o. twice daily for next few days.  Based on your heart rate and blood pressure, may need to go back to original dose of 25 mg twice daily    Neuropathy gabapentin  remains on hold till  your next follow-up with nephrologist  Goals of care   Code Status: Full Code   Consultants: Nephrology, ID Family Communication: Spouse not at bedside  Diet:  Diet Order             Diet general           Diet general           Diet regular Room service appropriate? Yes; Fluid consistency: Thin  Diet effective now                    Nutritional status:  Body mass index is 30.66 kg/m.       Wounds:  -    Discharge Exam:   Vitals:   04/09/24 2149 04/10/24 0542 04/10/24 0712 04/10/24 0802  BP: (!) 138/97 (!) 127/99  (!) 135/95  Pulse: (!) 103 91  86  Resp: 20 18    Temp: 98.8 F (37.1 C) 98.4 F (36.9 C)    TempSrc: Oral Oral    SpO2: 98% 99%    Weight:   88.8 kg   Height:        Body mass index is 30.66 kg/m.   General exam: Pleasant, middle-aged African-American male Skin: No rashes, lesions or ulcers. HEENT: Atraumatic, normocephalic, no obvious bleeding Lungs: Clear to auscultation bilaterally,  CVS: S1, S2, no murmur,   GI/Abd: Soft, has improving lower abdominal tenderness, nondistended, bowel sound present,   CNS: Alert, awake, oriented x 3 Psychiatry: Mood appropriate today Extremities: No pedal edema, no calf tenderness,   Follow ups:    Follow-up Information     Jefrey Mink I, MD Follow up.   Specialty: Internal Medicine Contact information: [address] [place] [address] [phone]                 Discharge Instructions:   Discharge Instructions     Call MD for:  difficulty breathing, headache or visual disturbances   Complete by: As directed    Call MD for:  difficulty breathing, headache or visual disturbances   Complete by: As directed    Call MD for:  extreme fatigue   Complete by: As directed    Call MD for:  extreme fatigue   Complete by: As directed    Call MD for:  hives   Complete by: As directed    Call MD for:  hives   Complete by: As directed    Call MD for:  persistant dizziness or light-headedness   Complete by: As directed    Call MD for:  persistant dizziness or light-headedness   Complete by: As directed    Call MD for:  persistant nausea and vomiting   Complete by: As directed    Call MD for:  persistant nausea and vomiting   Complete by: As directed    Call MD for:  severe uncontrolled pain   Complete  by: As directed    Call MD for:  severe uncontrolled pain   Complete by: As directed    Call MD for:  temperature >100.4   Complete by: As directed    Call MD for:  temperature >100.4   Complete by: As directed    Diet general   Complete by: As directed    Diet general   Complete by: As directed    Discharge instructions   Complete by: As directed    Recommendations at discharge:   Complete the course of antibiotics with 2 more days  of azithromycin   Continue to monitor diarrhea  Continue carvedilol  at reduced dose of 12.5 mg p.o. twice daily for next few days.  Based on your heart rate and blood pressure, may need to go back to original dose of 25 mg twice daily  You have an appointment with your transplant nephrologist on Tuesday 6/3.  General discharge instructions: Follow with Primary MD Jefrey Mink I, MD in 7 days  Please request your PCP  to go over your hospital tests, procedures, radiology results at the follow up. Please get your medicines reviewed and adjusted.  Your PCP may decide to repeat certain labs or tests as needed. Do not drive, operate heavy machinery, perform activities at heights, swimming or participation in water activities or provide baby sitting services if your were admitted for syncope or siezures until you have seen by Primary MD or a Neurologist and advised to do so again. [place]  Controlled Substance Reporting System database was reviewed. Do not drive, operate heavy machinery, perform activities at heights, swim, participate in water activities or provide baby-sitting services while on medications for pain, sleep and mood until your outpatient physician has reevaluated you and advised to do so again.  You are strongly recommended to comply with the dose, frequency and duration of prescribed medications. Activity: As tolerated with Full fall precautions use walker/cane & assistance as needed Avoid using any recreational substances like cigarette,  tobacco, alcohol, or non-prescribed drug. If you experience worsening of your admission symptoms, develop shortness of breath, life threatening emergency, suicidal or homicidal thoughts you must seek medical attention immediately by calling 911 or calling your MD immediately  if symptoms less severe. You must read complete instructions/literature along with all the possible adverse reactions/side effects for all the medicines you take and that have been prescribed to you. Take any new medicine only after you have completely understood and accepted all the possible adverse reactions/side effects.  Wear Seat belts while driving. You were cared for by a hospitalist during your hospital stay. If you have any questions about your discharge medications or the care you received while you were in the hospital after you are discharged, you can call the unit and ask to speak with the hospitalist or the covering physician. Once you are discharged, your primary care physician will handle any further medical issues. Please note that NO REFILLS for any discharge medications will be authorized once you are discharged, as it is imperative that you return to your primary care physician (or establish a relationship with a primary care physician if you do not have one).   Increase activity slowly   Complete by: As directed    Increase activity slowly   Complete by: As directed        Discharge Medications:   Allergies as of 04/10/2024       Reactions   Codeine Itching   Egg-derived Products    Shortness of breath   Heparin  Itching   Severe itching   Mercury    Childhood allergy   Tomato Itching        Medication List     STOP taking these medications    gabapentin  100 MG capsule Commonly known as: NEURONTIN        TAKE these medications    azithromycin  500 MG tablet Commonly known as: Zithromax  Take 1 tablet (500 mg total) by mouth daily for 2 days.   Biktarvy  50-200-25 MG Tabs  tablet Generic drug: bictegravir-emtricitabine -tenofovir  AF Take 1 tablet by mouth daily.  carvedilol  12.5 MG tablet Commonly known as: COREG  Take 1 tablet (12.5 mg total) by mouth 2 (two) times daily with a meal. What changed:  medication strength how much to take   cetirizine 10 MG tablet Commonly known as: ZYRTEC Take 10 mg by mouth daily.   mycophenolate  180 MG EC tablet Commonly known as: MYFORTIC  Take 720 mg by mouth 2 (two) times daily.   predniSONE  5 MG tablet Commonly known as: DELTASONE  Take 5 mg by mouth daily with breakfast.   sodium bicarbonate  650 MG tablet Take 650 mg by mouth daily.   sulfamethoxazole -trimethoprim  400-80 MG tablet Commonly known as: BACTRIM  Take 1 tablet by mouth 3 (three) times a week. Takes on Monday, Wednesday and Friday   tacrolimus  5 MG capsule Commonly known as: PROGRAF  Take 5 mg by mouth 2 (two) times daily.         The results of significant diagnostics from this hospitalization (including imaging, microbiology, ancillary and laboratory) are listed below for reference.    Procedures and Diagnostic Studies:   US  Renal Transplant w/Doppler Result Date: 04/08/2024 CLINICAL DATA:  Acute renal failure, stage III A chronic kidney disease. History of renal transplant. EXAM: ULTRASOUND OF RENAL TRANSPLANT WITH RENAL DOPPLER ULTRASOUND TECHNIQUE: Ultrasound examination of the renal transplant was performed with gray-scale, color and duplex doppler evaluation. COMPARISON:  None Available. FINDINGS: Transplant kidney location: Right lower quadrant Transplant Kidney: Renal measurements: 9.9 x 6.2 x 5.5 cm = volume: . Normal in size and parenchymal echogenicity. No evidence of mass or hydronephrosis. No peri-transplant fluid collection seen. Color flow in the main renal artery:  Yes Color flow in the main renal vein:  Yes Duplex Doppler Evaluation: Main Renal Artery Velocity:  cm/sec Main Renal Artery Resistive Index: 0.86 Venous waveform  in main renal vein:  Present Intrarenal resistive index in upper pole:  0.63 (normal 0.6-0.8; equivocal 0.8-0.9; abnormal >= 0.9) Intrarenal resistive index in lower pole: 0.73 (normal 0.6-0.8; equivocal 0.8-0.9; abnormal >= 0.9) Bladder: Normal for degree of bladder distention. Other findings:  None. IMPRESSION: Right lower quadrant renal transplant grossly unremarkable. No hydronephrosis. Electronically Signed   By: Janeece Mechanic M.D.   On: 04/08/2024 21:22   CT ABDOMEN PELVIS WO CONTRAST Result Date: 04/07/2024 CLINICAL DATA:  Lower abdominal pain, nausea, vomiting, diarrhea EXAM: CT ABDOMEN AND PELVIS WITHOUT CONTRAST TECHNIQUE: Multidetector CT imaging of the abdomen and pelvis was performed following the standard protocol without IV contrast. Unenhanced CT was performed per clinician order. Lack of IV contrast limits sensitivity and specificity, especially for evaluation of abdominal/pelvic solid viscera. RADIATION DOSE REDUCTION: This exam was performed according to the departmental dose-optimization program which includes automated exposure control, adjustment of the mA and/or kV according to patient size and/or use of iterative reconstruction technique. COMPARISON:  12/08/2021 FINDINGS: Lower chest: Minimal dependent hypoventilatory changes. No acute pleural or parenchymal lung disease. Hepatobiliary: Unremarkable unenhanced appearance of the liver and gallbladder. Pancreas: Unremarkable unenhanced appearance. Spleen: Unremarkable unenhanced appearance. Adrenals/Urinary Tract: Right lower quadrant transplant kidney is identified, with no urinary tract calculi or signs of obstruction. No perinephric fluid collection. The native kidneys are markedly atrophic, stable. The adrenals and bladder are unremarkable. Stomach/Bowel: No bowel obstruction or ileus. Normal appendix right lower quadrant. There is mild wall thickening and pericolonic fat stranding involving the ascending and transverse colon, consistent  with inflammatory or infectious colitis. Scattered gas fluid levels throughout the large and small bowel consistent with history of diarrheal illness. Small hiatal hernia. Vascular/Lymphatic: No significant vascular  findings on this unenhanced exam. No pathologic adenopathy. Reproductive: Prostate is unremarkable. Other: No free fluid or free intraperitoneal gas. No abdominal wall hernia. Musculoskeletal: No acute or destructive bony abnormalities. Reconstructed images demonstrate no additional findings. IMPRESSION: 1. Wall thickening and pericolonic fat stranding involving the proximal colon, consistent with inflammatory or infectious colitis. 2. Scattered gas fluid levels throughout the large and small bowel consistent with diarrheal illness. No obstruction or ileus. 3. Unremarkable right lower quadrant transplant kidney. 4. Small hiatal hernia. Electronically Signed   By: Bobbye Burrow M.D.   On: 04/07/2024 09:28     Labs:   Basic Metabolic Panel: Recent Labs  Lab 04/07/24 0618 04/08/24 0458 04/09/24 0429 04/10/24 0543 04/10/24 0548  NA 139 131* 137  --  137  K 4.1 3.7 4.0  --  4.2  CL 109 109 114*  --  118*  CO2 17* 16* 17*  --  16*  GLUCOSE 162* 97 92  --  92  BUN 18 26* 34*  --  27*  CREATININE 1.92* 2.90* 2.91*  --  2.04*  CALCIUM  9.6 7.6* 8.0*  --  8.0*  MG  --   --   --  1.6*  --   PHOS  --   --   --  1.8*  --    GFR Estimated Creatinine Clearance: 46.1 mL/min (A) (by C-G formula based on SCr of 2.04 mg/dL (H)). Liver Function Tests: Recent Labs  Lab 04/07/24 0618  AST 18  ALT 23  ALKPHOS 56  BILITOT 0.5  PROT 6.9  ALBUMIN 4.2   Recent Labs  Lab 04/07/24 0618  LIPASE 44   No results for input(s): "AMMONIA" in the last 168 hours. Coagulation profile No results for input(s): "INR", "PROTIME" in the last 168 hours.  CBC: Recent Labs  Lab 04/07/24 0618 04/08/24 0458 04/09/24 0429 04/10/24 0548  WBC 13.9* 9.9 9.7 8.5  NEUTROABS  --   --  7.6 5.5  HGB 13.5  12.6* 12.6* 11.4*  HCT 40.0 39.6 37.3* 36.7*  MCV 95.7 100.3* 98.9 101.1*  PLT 176 147* 145* 134*   Cardiac Enzymes: No results for input(s): "CKTOTAL", "CKMB", "CKMBINDEX", "TROPONINI" in the last 168 hours. BNP: Invalid input(s): "POCBNP" CBG: No results for input(s): "GLUCAP" in the last 168 hours. D-Dimer No results for input(s): "DDIMER" in the last 72 hours. Hgb A1c No results for input(s): "HGBA1C" in the last 72 hours. Lipid Profile No results for input(s): "CHOL", "HDL", "LDLCALC", "TRIG", "CHOLHDL", "LDLDIRECT" in the last 72 hours. Thyroid  function studies No results for input(s): "TSH", "T4TOTAL", "T3FREE", "THYROIDAB" in the last 72 hours.  Invalid input(s): "FREET3" Anemia work up No results for input(s): "VITAMINB12", "FOLATE", "FERRITIN", "TIBC", "IRON", "RETICCTPCT" in the last 72 hours. Microbiology Recent Results (from the past 240 hours)  Resp panel by RT-PCR (RSV, Flu A&B, Covid) Urine, Clean Catch     Status: None   Collection Time: 04/07/24  9:08 AM   Specimen: Urine, Clean Catch; Nasal Swab  Result Value Ref Range Status   SARS Coronavirus 2 by RT PCR NEGATIVE NEGATIVE Final    Comment: (NOTE) SARS-CoV-2 target nucleic acids are NOT DETECTED.  The SARS-CoV-2 RNA is generally detectable in upper respiratory specimens during the acute phase of infection. The lowest concentration of SARS-CoV-2 viral copies this assay can detect is 138 copies/mL. A negative result does not preclude SARS-Cov-2 infection and should not be used as the sole basis for treatment or other patient management decisions. A negative  result may occur with  improper specimen collection/handling, submission of specimen other than nasopharyngeal swab, presence of viral mutation(s) within the areas targeted by this assay, and inadequate number of viral copies(<138 copies/mL). A negative result must be combined with clinical observations, patient history, and  epidemiological information. The expected result is Negative.  Fact Sheet for Patients:  BloggerCourse.com  Fact Sheet for Healthcare Providers:  SeriousBroker.it  This test is no t yet approved or cleared by the [place]  FDA and  has been authorized for detection and/or diagnosis of SARS-CoV-2 by FDA under an Emergency Use Authorization (EUA). This EUA will remain  in effect (meaning this test can be used) for the duration of the COVID-19 declaration under Section 564(b)(1) of the Act, 21 U.S.C.section 360bbb-3(b)(1), unless the authorization is terminated  or revoked sooner.       Influenza A by PCR NEGATIVE NEGATIVE Final   Influenza B by PCR NEGATIVE NEGATIVE Final    Comment: (NOTE) The Xpert Xpress SARS-CoV-2/FLU/RSV plus assay is intended as an aid in the diagnosis of influenza from Nasopharyngeal swab specimens and should not be used as a sole basis for treatment. Nasal washings and aspirates are unacceptable for Xpert Xpress SARS-CoV-2/FLU/RSV testing.  Fact Sheet for Patients: BloggerCourse.com  Fact Sheet for Healthcare Providers: SeriousBroker.it  This test is not yet approved or cleared by the [place]  FDA and has been authorized for detection and/or diagnosis of SARS-CoV-2 by FDA under an Emergency Use Authorization (EUA). This EUA will remain in effect (meaning this test can be used) for the duration of the COVID-19 declaration under Section 564(b)(1) of the Act, 21 U.S.C. section 360bbb-3(b)(1), unless the authorization is terminated or revoked.     Resp Syncytial Virus by PCR NEGATIVE NEGATIVE Final    Comment: (NOTE) Fact Sheet for Patients: BloggerCourse.com  Fact Sheet for Healthcare Providers: SeriousBroker.it  This test is not yet approved or cleared by the [place]  FDA and has been  authorized for detection and/or diagnosis of SARS-CoV-2 by FDA under an Emergency Use Authorization (EUA). This EUA will remain in effect (meaning this test can be used) for the duration of the COVID-19 declaration under Section 564(b)(1) of the Act, 21 U.S.C. section 360bbb-3(b)(1), unless the authorization is terminated or revoked.  Performed at [facility], [address] [place], [place] [address]   Culture, blood (Routine X 2) w Reflex to ID Panel     Status: None (Preliminary result)   Collection Time: 04/08/24  4:58 AM   Specimen: BLOOD RIGHT HAND  Result Value Ref Range Status   Specimen Description   Final    BLOOD RIGHT HAND Performed at [facility], [address] [place], [place] [address]    Special Requests   Final    BOTTLES DRAWN AEROBIC AND ANAEROBIC Blood Culture results may not be optimal due to an inadequate volume of blood received in culture bottles Performed at [facility], [address], [place], [place] [address]    Culture   Final    NO GROWTH 1 DAY Performed at [facility], [address], [place], [place] [address]    Report Status PENDING  Incomplete  Culture, blood (Routine X 2) w Reflex to ID Panel     Status: None (Preliminary result)   Collection Time: 04/08/24  4:58 AM   Specimen: BLOOD RIGHT ARM  Result Value Ref Range Status   Specimen Description   Final    BLOOD RIGHT  ARM Performed at [facility], [address], [place], [place] [address]    Special Requests   Final    BOTTLES DRAWN AEROBIC AND ANAEROBIC Blood Culture results may not be optimal due to an inadequate volume of blood received in culture bottles Performed at [facility], [address], [place], [place] [address]    Culture   Final    NO GROWTH 1 DAY Performed at [facility], [address] [place], [place] [address]    Report Status PENDING  Incomplete  Gastrointestinal Panel by  PCR , Stool     Status: Abnormal   Collection Time: 04/08/24  6:54 AM   Specimen: Stool  Result Value Ref Range Status   Campylobacter species NOT DETECTED NOT DETECTED Final   Plesimonas shigelloides NOT DETECTED NOT DETECTED Final   Salmonella species NOT DETECTED NOT DETECTED Final   Yersinia enterocolitica NOT DETECTED NOT DETECTED Final   Vibrio species NOT DETECTED NOT DETECTED Final   Vibrio cholerae NOT DETECTED NOT DETECTED Final   Enteroaggregative E coli (EAEC) NOT DETECTED NOT DETECTED Final   Enteropathogenic E coli (EPEC) NOT DETECTED NOT DETECTED Final   Enterotoxigenic E coli (ETEC) NOT DETECTED NOT DETECTED Final   Shiga like toxin producing E coli (STEC) NOT DETECTED NOT DETECTED Final   Shigella/Enteroinvasive E coli (EIEC) DETECTED (A) NOT DETECTED Final    Comment: RESULT CALLED TO, READ BACK BY AND VERIFIED WITH: HILLARY MASHBURN RN 1419 04/08/24 HNM    Cryptosporidium NOT DETECTED NOT DETECTED Final   Cyclospora cayetanensis NOT DETECTED NOT DETECTED Final   Entamoeba histolytica NOT DETECTED NOT DETECTED Final   Giardia lamblia NOT DETECTED NOT DETECTED Final   Adenovirus F40/41 NOT DETECTED NOT DETECTED Final   Astrovirus NOT DETECTED NOT DETECTED Final   Norovirus GI/GII NOT DETECTED NOT DETECTED Final   Rotavirus A NOT DETECTED NOT DETECTED Final   Sapovirus (I, II, IV, and V) NOT DETECTED NOT DETECTED Final    Comment: Performed at [facility], [address] [place], [place] [address]  C Difficile Quick Screen w PCR reflex     Status: None   Collection Time: 04/08/24 11:06 AM   Specimen: STOOL  Result Value Ref Range Status   C Diff antigen NEGATIVE NEGATIVE Final   C Diff toxin NEGATIVE NEGATIVE Final   C Diff interpretation No C. difficile detected.  Final    Comment: Performed at [facility], [address] [place], [place] [address]    Time coordinating discharge: 45 minutes  Signed: Aminta Baldy Lowanda Cashaw  [facility] 04/10/2024, 11:24 AM

## 2024-04-10 NOTE — Plan of Care (Signed)

## 2024-04-10 NOTE — Progress Notes (Signed)
 Patient received discharge orders to go home. Patient was given discharge paperwork/instructions and home medications. RN went over discharge instructions/paperwork with patient. All questions/concerns were addressed/answered to the best of RN's ability. Patient left the hospital stable, had discharge paperwork/instructions and home medications, and had all personal belongings.

## 2024-04-13 DIAGNOSIS — Z79899 Other long term (current) drug therapy: Secondary | ICD-10-CM | POA: Diagnosis not present

## 2024-04-13 DIAGNOSIS — I10 Essential (primary) hypertension: Secondary | ICD-10-CM | POA: Diagnosis not present

## 2024-04-13 DIAGNOSIS — D849 Immunodeficiency, unspecified: Secondary | ICD-10-CM | POA: Diagnosis not present

## 2024-04-13 DIAGNOSIS — Z5181 Encounter for therapeutic drug level monitoring: Secondary | ICD-10-CM | POA: Diagnosis not present

## 2024-04-13 DIAGNOSIS — D638 Anemia in other chronic diseases classified elsewhere: Secondary | ICD-10-CM | POA: Diagnosis not present

## 2024-04-13 DIAGNOSIS — Z4822 Encounter for aftercare following kidney transplant: Secondary | ICD-10-CM | POA: Diagnosis not present

## 2024-04-13 DIAGNOSIS — Z79621 Long term (current) use of calcineurin inhibitor: Secondary | ICD-10-CM | POA: Diagnosis not present

## 2024-04-13 DIAGNOSIS — Z940 Kidney transplant status: Secondary | ICD-10-CM | POA: Diagnosis not present

## 2024-04-13 DIAGNOSIS — R7989 Other specified abnormal findings of blood chemistry: Secondary | ICD-10-CM | POA: Diagnosis not present

## 2024-04-13 LAB — CULTURE, BLOOD (ROUTINE X 2)
Culture: NO GROWTH
Culture: NO GROWTH

## 2024-04-27 LAB — MISCELLANEOUS TEST

## 2024-05-31 DIAGNOSIS — Z125 Encounter for screening for malignant neoplasm of prostate: Secondary | ICD-10-CM | POA: Diagnosis not present

## 2024-05-31 DIAGNOSIS — N5203 Combined arterial insufficiency and corporo-venous occlusive erectile dysfunction: Secondary | ICD-10-CM | POA: Diagnosis not present

## 2024-07-26 DIAGNOSIS — Z940 Kidney transplant status: Secondary | ICD-10-CM | POA: Diagnosis not present

## 2024-07-26 DIAGNOSIS — N1832 Chronic kidney disease, stage 3b: Secondary | ICD-10-CM | POA: Diagnosis not present

## 2024-07-26 DIAGNOSIS — Z862 Personal history of diseases of the blood and blood-forming organs and certain disorders involving the immune mechanism: Secondary | ICD-10-CM | POA: Diagnosis not present

## 2024-07-26 DIAGNOSIS — E559 Vitamin D deficiency, unspecified: Secondary | ICD-10-CM | POA: Diagnosis not present

## 2024-07-26 DIAGNOSIS — I10 Essential (primary) hypertension: Secondary | ICD-10-CM | POA: Diagnosis not present

## 2024-07-26 DIAGNOSIS — N189 Chronic kidney disease, unspecified: Secondary | ICD-10-CM | POA: Diagnosis not present

## 2024-07-26 DIAGNOSIS — Z7960 Long term (current) use of unspecified immunomodulators and immunosuppressants: Secondary | ICD-10-CM | POA: Diagnosis not present

## 2024-08-09 DIAGNOSIS — N189 Chronic kidney disease, unspecified: Secondary | ICD-10-CM | POA: Diagnosis not present

## 2024-08-09 DIAGNOSIS — Z862 Personal history of diseases of the blood and blood-forming organs and certain disorders involving the immune mechanism: Secondary | ICD-10-CM | POA: Diagnosis not present

## 2024-08-09 DIAGNOSIS — B169 Acute hepatitis B without delta-agent and without hepatic coma: Secondary | ICD-10-CM | POA: Diagnosis not present

## 2024-08-09 DIAGNOSIS — B20 Human immunodeficiency virus [HIV] disease: Secondary | ICD-10-CM | POA: Diagnosis not present

## 2024-08-13 DIAGNOSIS — Z862 Personal history of diseases of the blood and blood-forming organs and certain disorders involving the immune mechanism: Secondary | ICD-10-CM | POA: Diagnosis not present

## 2024-08-13 DIAGNOSIS — N179 Acute kidney failure, unspecified: Secondary | ICD-10-CM | POA: Diagnosis not present

## 2024-08-13 DIAGNOSIS — N189 Chronic kidney disease, unspecified: Secondary | ICD-10-CM | POA: Diagnosis not present

## 2024-08-13 DIAGNOSIS — Z940 Kidney transplant status: Secondary | ICD-10-CM | POA: Diagnosis not present

## 2024-08-27 DIAGNOSIS — Z4822 Encounter for aftercare following kidney transplant: Secondary | ICD-10-CM | POA: Diagnosis not present

## 2024-08-27 DIAGNOSIS — D849 Immunodeficiency, unspecified: Secondary | ICD-10-CM | POA: Diagnosis not present

## 2024-08-27 DIAGNOSIS — Z006 Encounter for examination for normal comparison and control in clinical research program: Secondary | ICD-10-CM | POA: Diagnosis not present

## 2024-08-27 DIAGNOSIS — Z940 Kidney transplant status: Secondary | ICD-10-CM | POA: Diagnosis not present

## 2024-08-27 DIAGNOSIS — T451X5A Adverse effect of antineoplastic and immunosuppressive drugs, initial encounter: Secondary | ICD-10-CM | POA: Diagnosis not present

## 2024-08-27 DIAGNOSIS — Z23 Encounter for immunization: Secondary | ICD-10-CM | POA: Diagnosis not present

## 2024-08-27 DIAGNOSIS — Z5181 Encounter for therapeutic drug level monitoring: Secondary | ICD-10-CM | POA: Diagnosis not present

## 2024-08-27 DIAGNOSIS — Z79621 Long term (current) use of calcineurin inhibitor: Secondary | ICD-10-CM | POA: Diagnosis not present

## 2024-08-27 DIAGNOSIS — N1419 Nephropathy induced by other drugs, medicaments and biological substances: Secondary | ICD-10-CM | POA: Diagnosis not present

## 2024-10-11 DIAGNOSIS — R7303 Prediabetes: Secondary | ICD-10-CM | POA: Diagnosis not present

## 2024-10-11 DIAGNOSIS — B0223 Postherpetic polyneuropathy: Secondary | ICD-10-CM | POA: Diagnosis not present

## 2024-10-11 DIAGNOSIS — I129 Hypertensive chronic kidney disease with stage 1 through stage 4 chronic kidney disease, or unspecified chronic kidney disease: Secondary | ICD-10-CM | POA: Diagnosis not present

## 2024-10-11 DIAGNOSIS — E559 Vitamin D deficiency, unspecified: Secondary | ICD-10-CM | POA: Diagnosis not present

## 2024-10-11 DIAGNOSIS — E872 Acidosis, unspecified: Secondary | ICD-10-CM | POA: Diagnosis not present

## 2024-10-11 DIAGNOSIS — B169 Acute hepatitis B without delta-agent and without hepatic coma: Secondary | ICD-10-CM | POA: Diagnosis not present

## 2024-10-11 DIAGNOSIS — Z21 Asymptomatic human immunodeficiency virus [HIV] infection status: Secondary | ICD-10-CM | POA: Diagnosis not present

## 2024-10-11 DIAGNOSIS — N183 Chronic kidney disease, stage 3 unspecified: Secondary | ICD-10-CM | POA: Diagnosis not present

## 2024-10-11 DIAGNOSIS — D84821 Immunodeficiency due to drugs: Secondary | ICD-10-CM | POA: Diagnosis not present

## 2024-10-11 DIAGNOSIS — N529 Male erectile dysfunction, unspecified: Secondary | ICD-10-CM | POA: Diagnosis not present

## 2024-10-11 DIAGNOSIS — E669 Obesity, unspecified: Secondary | ICD-10-CM | POA: Diagnosis not present

## 2024-10-11 DIAGNOSIS — Z940 Kidney transplant status: Secondary | ICD-10-CM | POA: Diagnosis not present
